# Patient Record
Sex: Male | Born: 1955
Health system: Southern US, Community
[De-identification: ages and names within clinical notes are randomized; demographics above are authoritative.]

## PROBLEM LIST (undated history)

## (undated) DIAGNOSIS — I10 Essential (primary) hypertension: Secondary | ICD-10-CM

## (undated) DIAGNOSIS — J449 Chronic obstructive pulmonary disease, unspecified: Secondary | ICD-10-CM

## (undated) DIAGNOSIS — N400 Enlarged prostate without lower urinary tract symptoms: Secondary | ICD-10-CM

## (undated) DIAGNOSIS — K219 Gastro-esophageal reflux disease without esophagitis: Secondary | ICD-10-CM

## (undated) DIAGNOSIS — F32A Depression, unspecified: Secondary | ICD-10-CM

## (undated) DIAGNOSIS — F200 Paranoid schizophrenia: Secondary | ICD-10-CM

## (undated) DIAGNOSIS — F419 Anxiety disorder, unspecified: Secondary | ICD-10-CM

## (undated) DIAGNOSIS — J189 Pneumonia, unspecified organism: Secondary | ICD-10-CM

## (undated) DIAGNOSIS — I2510 Atherosclerotic heart disease of native coronary artery without angina pectoris: Secondary | ICD-10-CM

## (undated) DIAGNOSIS — Z8719 Personal history of other diseases of the digestive system: Secondary | ICD-10-CM

## (undated) DIAGNOSIS — H409 Unspecified glaucoma: Secondary | ICD-10-CM

## (undated) DIAGNOSIS — Z8711 Personal history of peptic ulcer disease: Secondary | ICD-10-CM

## (undated) DIAGNOSIS — K5090 Crohn's disease, unspecified, without complications: Secondary | ICD-10-CM

## (undated) DIAGNOSIS — I82409 Acute embolism and thrombosis of unspecified deep veins of unspecified lower extremity: Secondary | ICD-10-CM

## (undated) DIAGNOSIS — J9811 Atelectasis: Secondary | ICD-10-CM

## (undated) DIAGNOSIS — K589 Irritable bowel syndrome without diarrhea: Secondary | ICD-10-CM

## (undated) DIAGNOSIS — F329 Major depressive disorder, single episode, unspecified: Secondary | ICD-10-CM

## (undated) DIAGNOSIS — E785 Hyperlipidemia, unspecified: Secondary | ICD-10-CM

## (undated) DIAGNOSIS — J42 Unspecified chronic bronchitis: Secondary | ICD-10-CM

## (undated) DIAGNOSIS — R7303 Prediabetes: Secondary | ICD-10-CM

## (undated) DIAGNOSIS — R011 Cardiac murmur, unspecified: Secondary | ICD-10-CM

## (undated) DIAGNOSIS — R55 Syncope and collapse: Secondary | ICD-10-CM

## (undated) DIAGNOSIS — I739 Peripheral vascular disease, unspecified: Secondary | ICD-10-CM

## (undated) DIAGNOSIS — I639 Cerebral infarction, unspecified: Secondary | ICD-10-CM

## (undated) DIAGNOSIS — R0600 Dyspnea, unspecified: Secondary | ICD-10-CM

## (undated) HISTORY — DX: Syncope and collapse: R55

## (undated) HISTORY — DX: Paranoid schizophrenia: F20.0

## (undated) HISTORY — DX: Peripheral vascular disease, unspecified: I73.9

## (undated) HISTORY — PX: FASCIOTOMY: SHX132

## (undated) HISTORY — DX: Dyspnea, unspecified: R06.00

## (undated) HISTORY — PX: TONSILLECTOMY: SUR1361

## (undated) HISTORY — DX: Irritable bowel syndrome, unspecified: K58.9

## (undated) HISTORY — DX: Hyperlipidemia, unspecified: E78.5

## (undated) HISTORY — DX: Atelectasis: J98.11

## (undated) HISTORY — DX: Crohn's disease, unspecified, without complications: K50.90

## (undated) HISTORY — PX: COLECTOMY: SHX59

## (undated) HISTORY — PX: HERNIA REPAIR: SHX51

## (undated) HISTORY — PX: LAPAROSCOPIC CHOLECYSTECTOMY: SUR755

## (undated) HISTORY — PX: ABDOMINAL HERNIA REPAIR: SHX539

## (undated) HISTORY — PX: EYE SURGERY: SHX253

## (undated) HISTORY — DX: Essential (primary) hypertension: I10

---

## 1997-06-19 HISTORY — PX: PERIPHERAL VASCULAR CATHETERIZATION: SHX172C

## 2000-12-11 ENCOUNTER — Emergency Department (HOSPITAL_COMMUNITY): Admission: EM | Admit: 2000-12-11 | Discharge: 2000-12-11 | Payer: Self-pay | Admitting: Emergency Medicine

## 2000-12-11 ENCOUNTER — Encounter: Payer: Self-pay | Admitting: Emergency Medicine

## 2001-01-14 ENCOUNTER — Emergency Department (HOSPITAL_COMMUNITY): Admission: EM | Admit: 2001-01-14 | Discharge: 2001-01-14 | Payer: Self-pay | Admitting: Emergency Medicine

## 2003-02-11 ENCOUNTER — Inpatient Hospital Stay (HOSPITAL_COMMUNITY): Admission: EM | Admit: 2003-02-11 | Discharge: 2003-02-12 | Payer: Self-pay | Admitting: Emergency Medicine

## 2003-04-07 ENCOUNTER — Encounter (HOSPITAL_COMMUNITY): Admission: RE | Admit: 2003-04-07 | Discharge: 2003-07-06 | Payer: Self-pay | Admitting: Internal Medicine

## 2004-02-13 ENCOUNTER — Emergency Department (HOSPITAL_COMMUNITY): Admission: EM | Admit: 2004-02-13 | Discharge: 2004-02-13 | Payer: Self-pay | Admitting: Emergency Medicine

## 2004-02-13 IMAGING — CR DG ABDOMEN ACUTE W/ 1V CHEST
4 series · 4 of 4 positions shown · non-contrast
Comparison: none

CLINICAL DATA: Nausea, vomiting, and abdominal cramping.
 ABDOMEN 2 VIEWS
 Supine and upright views show an unremarkable bowel gas pattern without evidence of ileus or obstruction.  There is a surgical clip in the right lower quadrant.  No abnormal calcifications or significant bony findings.
 CHEST 1 VIEW:
 The heart size is normal.  The mediastinum is unremarkable.  The lungs are clear.  No soft tissue or bony abnormality is seen.  No free air under the diaphragm.
 IMPRESSION
 1.  Negative acute abdominal series.

[view not recorded (1 of 4)]
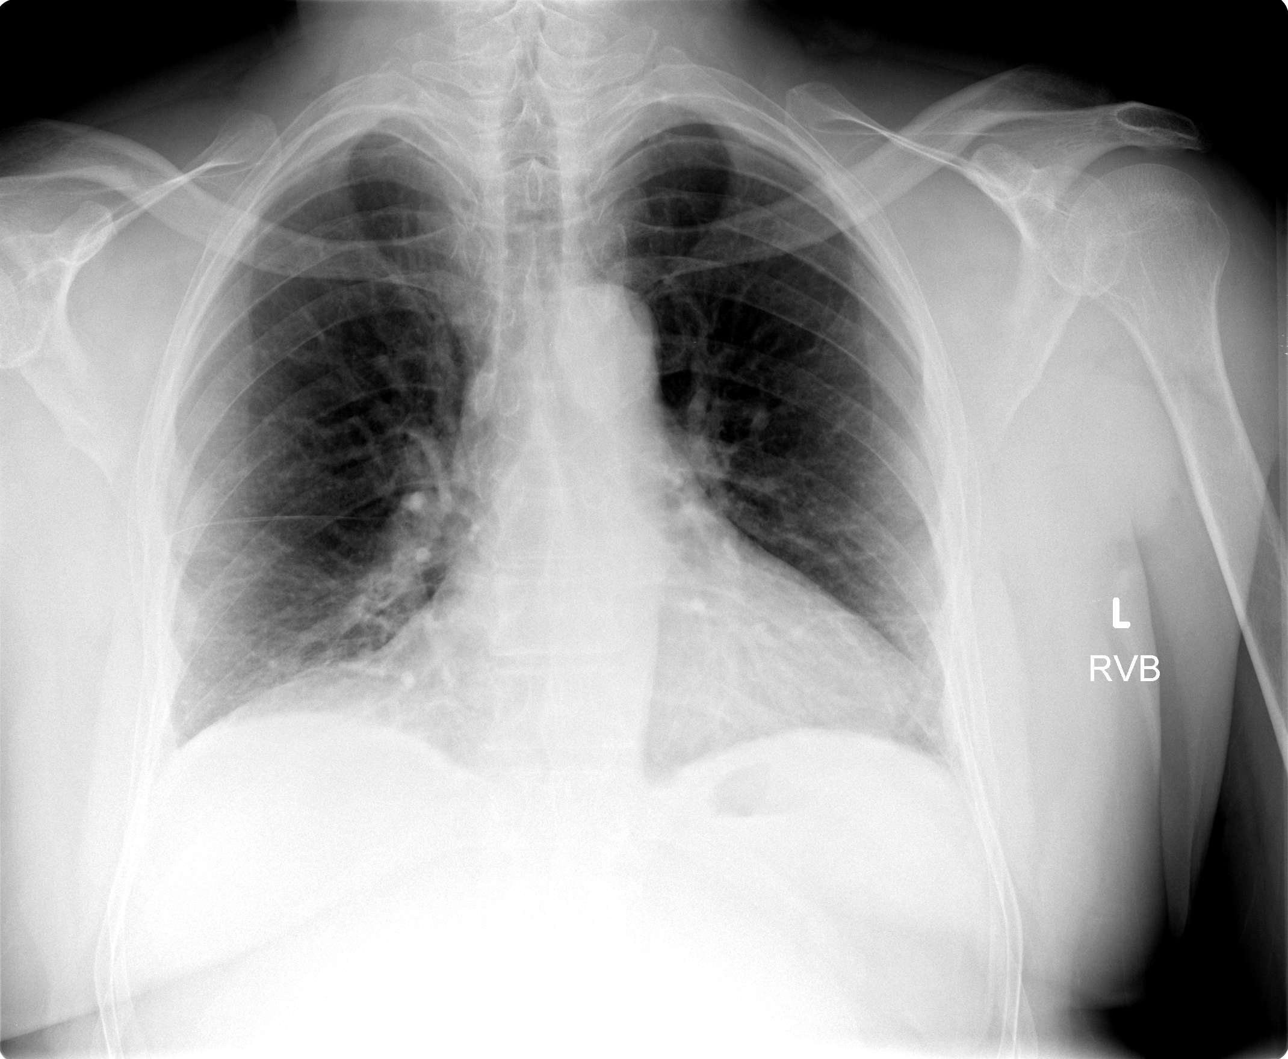

[view not recorded (2 of 4)]
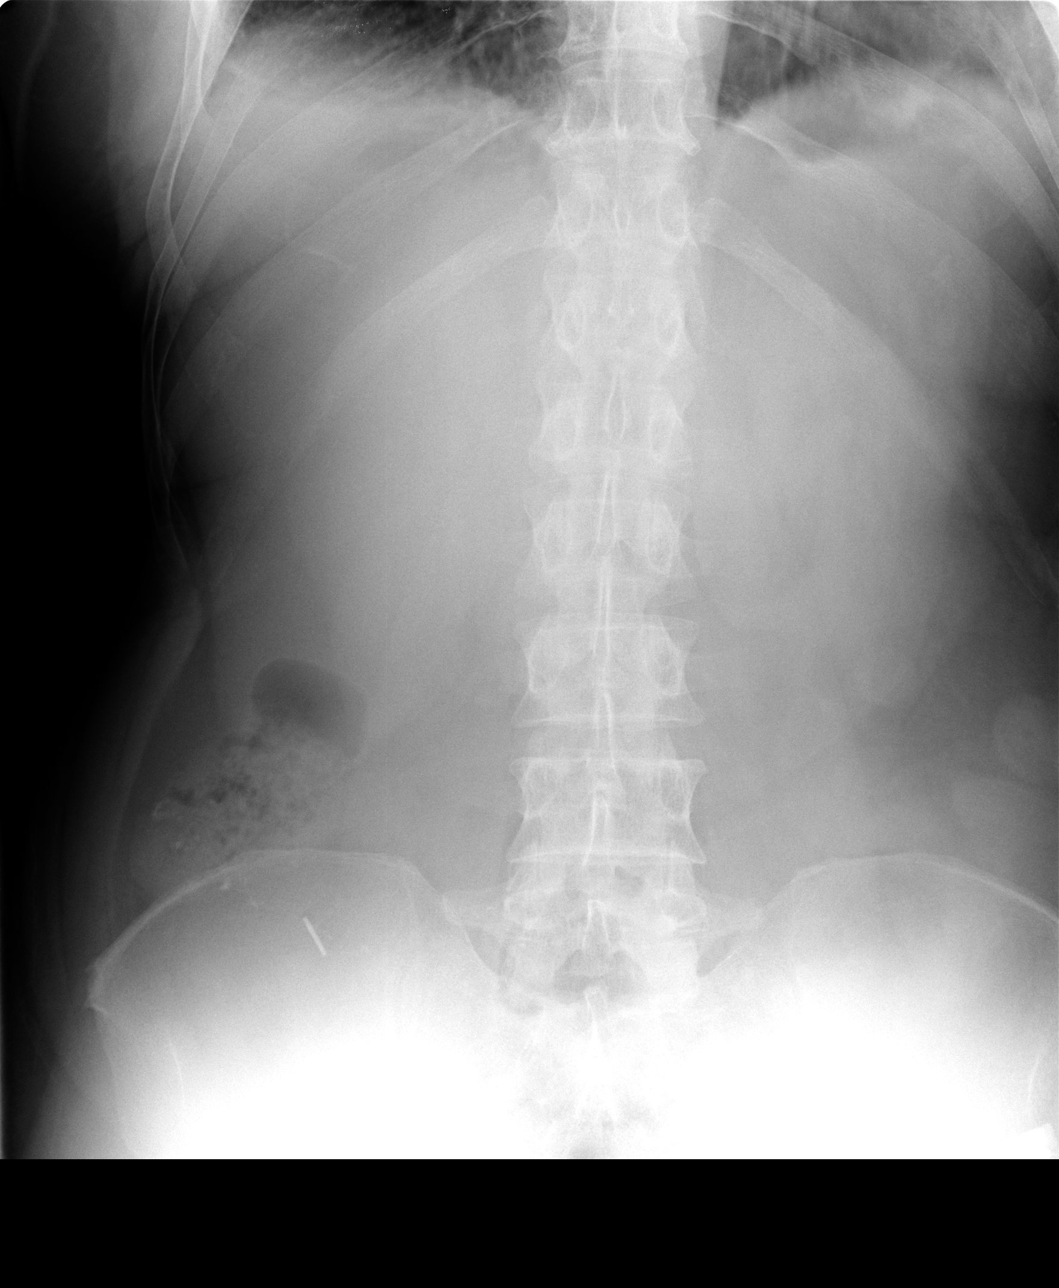

[view not recorded (3 of 4)]
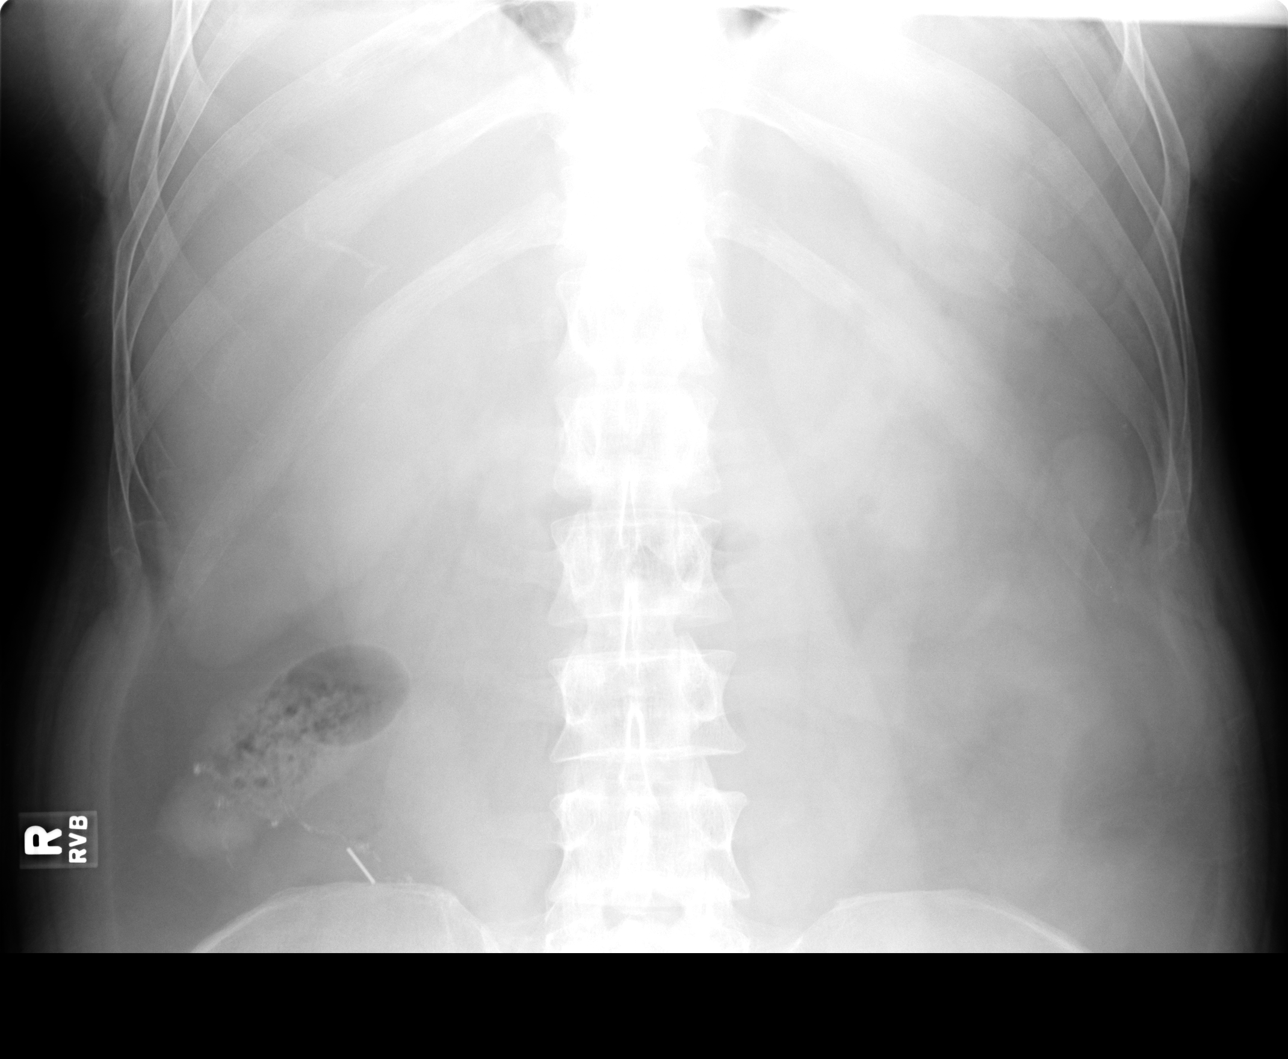

[view not recorded (4 of 4)]
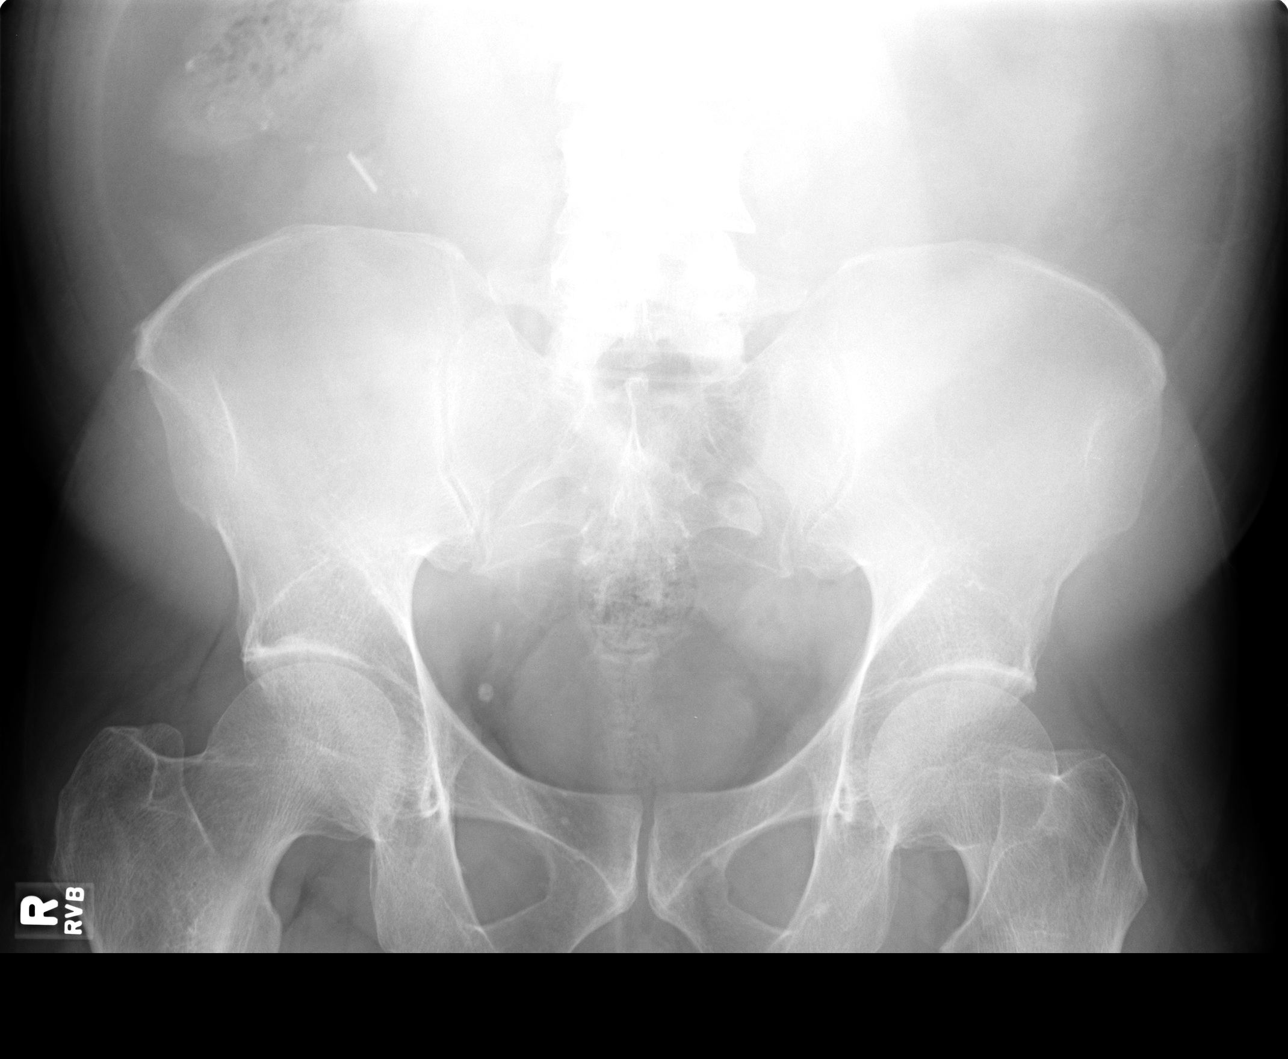

[4 of 4 positions shown; findings below may reference images not displayed]

## 2004-03-03 ENCOUNTER — Emergency Department (HOSPITAL_COMMUNITY): Admission: EM | Admit: 2004-03-03 | Discharge: 2004-03-03 | Payer: Self-pay | Admitting: Emergency Medicine

## 2004-03-03 IMAGING — CT CT HEAD W/O CM
1 of 2 series · 13 of 30 positions shown, 17 images · non-contrast
Comparison: None.

CLINICAL DATA: Dizziness, nausea/vomiting.
 CRANIAL CT - WITHOUT CONTRAST -   [DATE]
TECHNIQUE: 5 mm axial images were obtained from the skull base through the brain to the vertex.

[Series 3: — · axial · 0.43mm/px · z∈[-63,+62]mm · 13 of 31 slices shown, 17 images]
[im 3/31  brain]
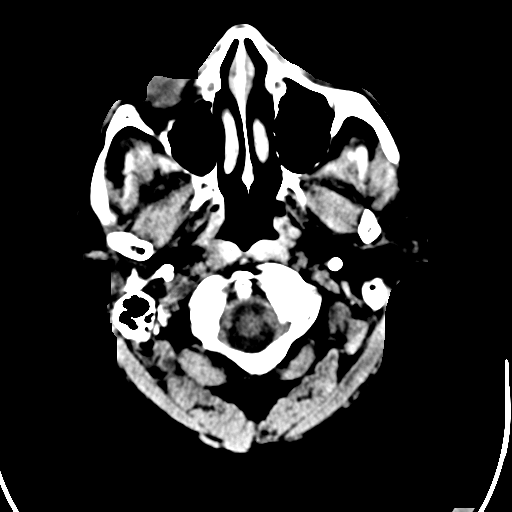
[im 3/31  bone]
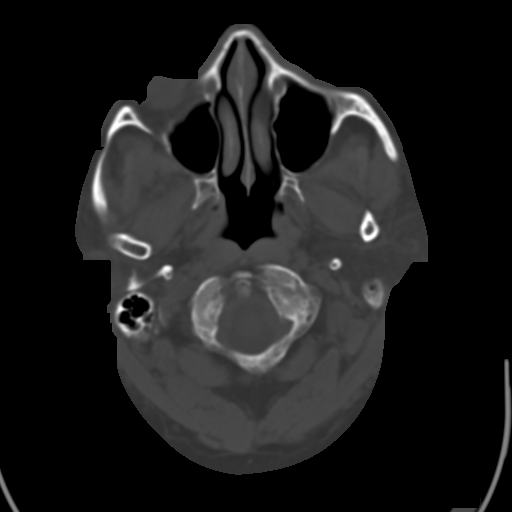
[im 5/31  brain]
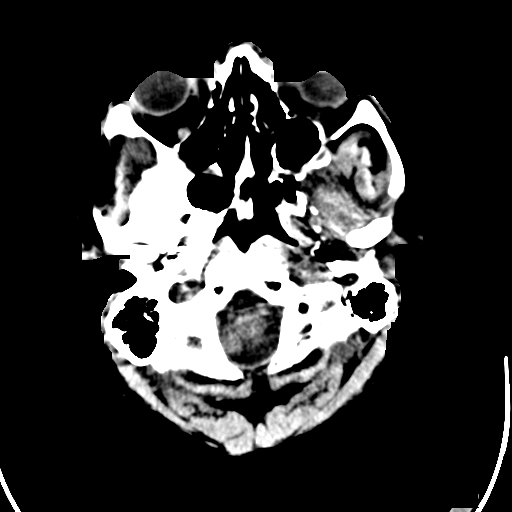
[im 7/31  brain]
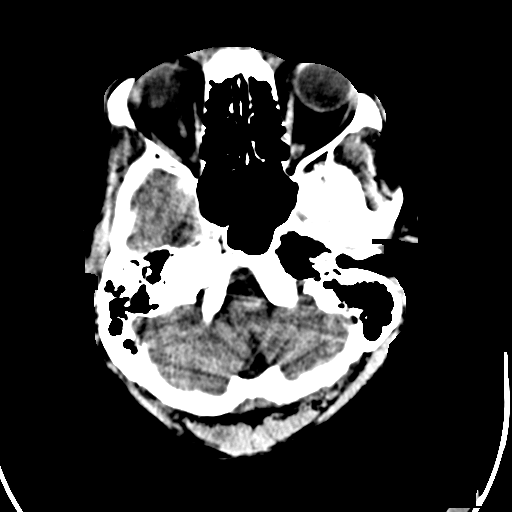
[im 9/31  brain]
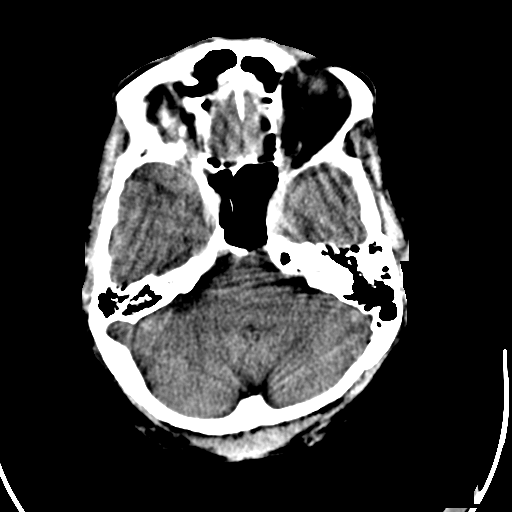
[im 11/31  brain]
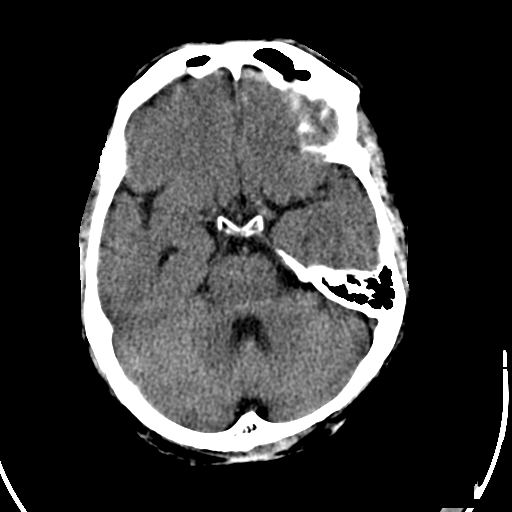
[im 11/31  bone]
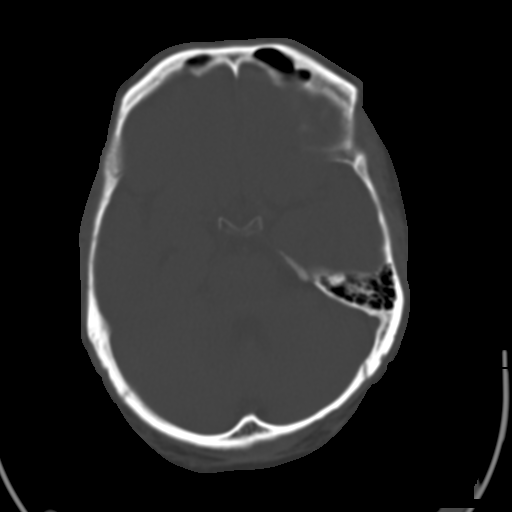
[im 13/31  brain]
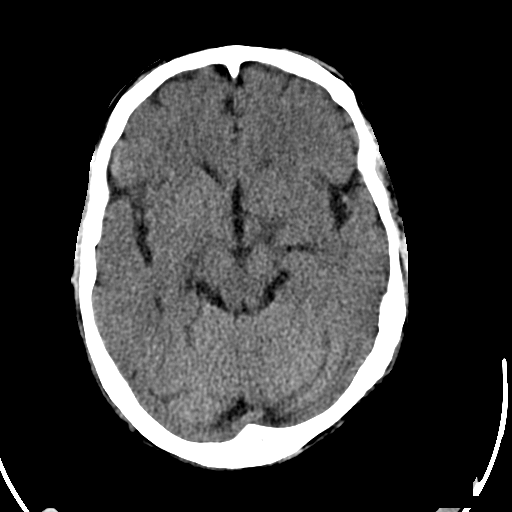
[im 16/31  brain]
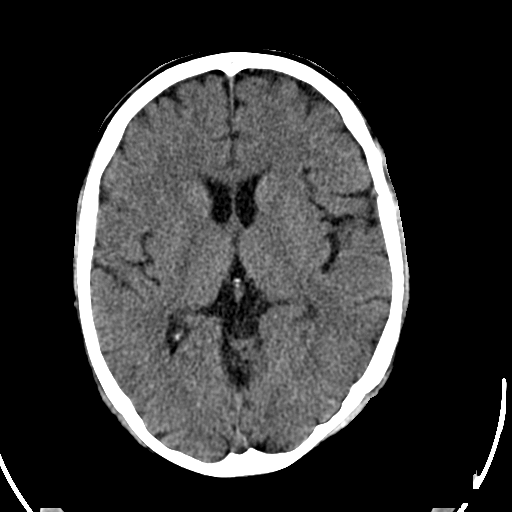
[im 18/31  brain]
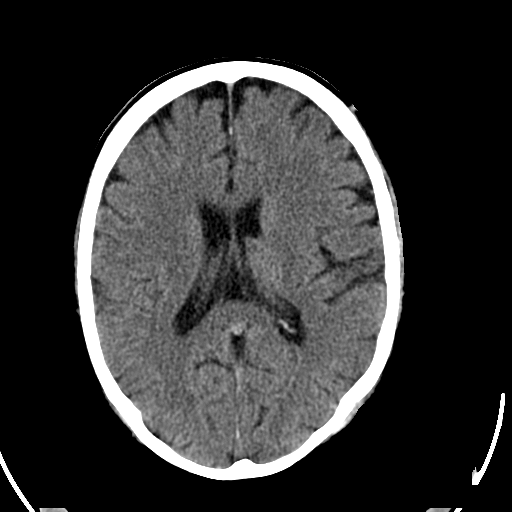
[im 20/31  brain]
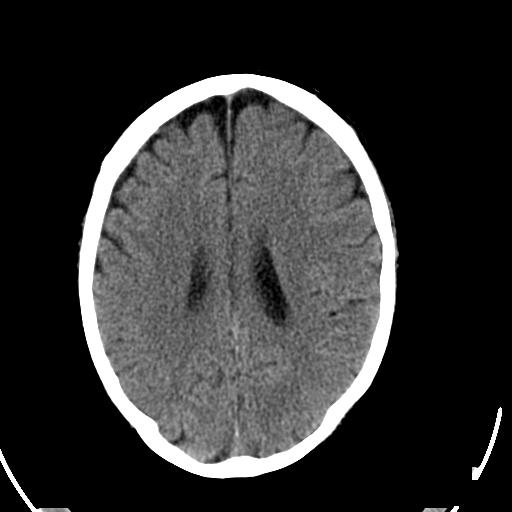
[im 20/31  bone]
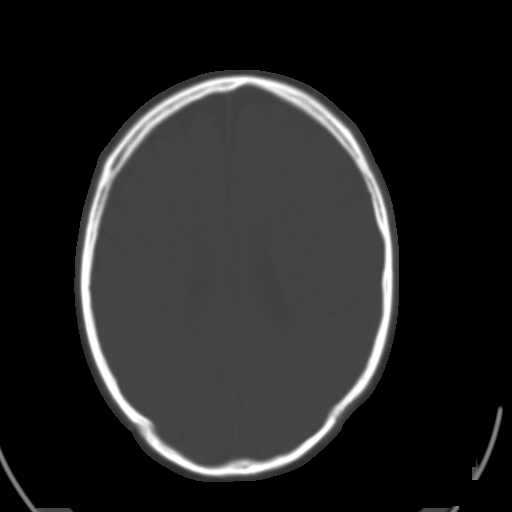
[im 22/31  brain]
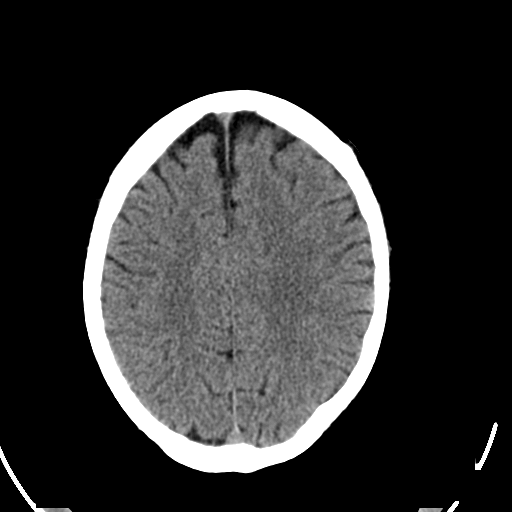
[im 24/31  brain]
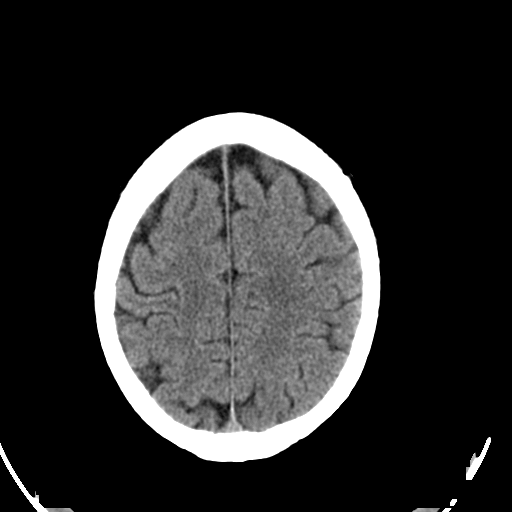
[im 26/31  brain]
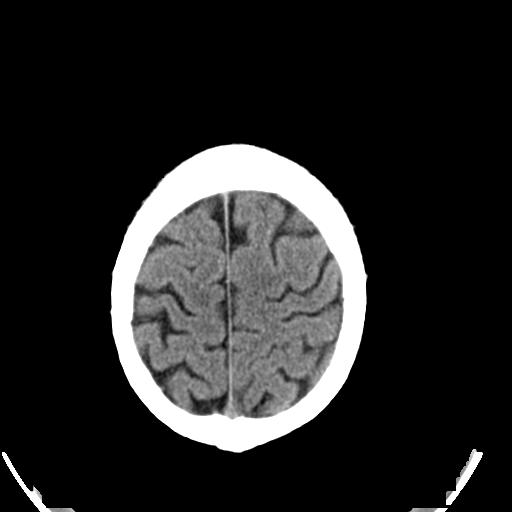
[im 28/31  brain]
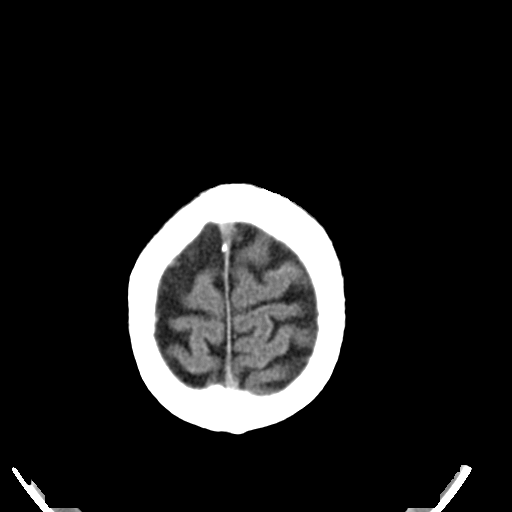
[im 28/31  bone]
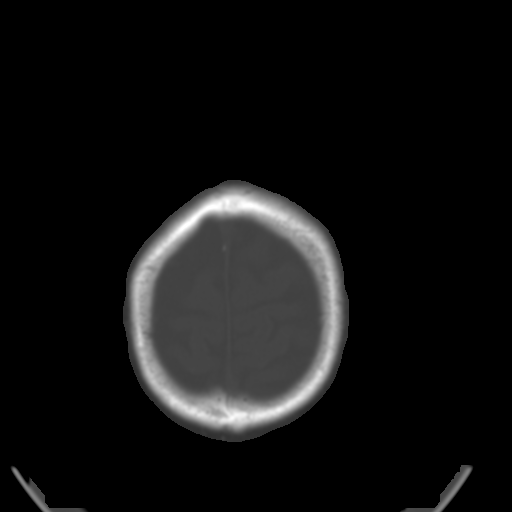

[13 of 30 positions shown; findings below may reference images not displayed]

FINDINGS: The ventricular system is normal in size and appearance for age.  There is no mass effect or midline shift.  There is no hemorrhage or hematoma.  No extra-axial fluid collections are identified.  I see no focal brain parenchymal abnormalities. 
 The bone window images demonstrate no focal osseous abnormalities involving the skull.  The visualized paranasal sinuses and the mastoid air cells appear well aerated.   
 IMPRESSION
 Normal unenhanced cranial CT.

## 2004-05-06 ENCOUNTER — Encounter: Admission: RE | Admit: 2004-05-06 | Discharge: 2004-05-06 | Payer: Self-pay | Admitting: Internal Medicine

## 2004-05-06 IMAGING — CR DG HIP COMPLETE 2+V*R*
3 series · 3 of 3 positions shown · non-contrast
Comparison: none

CLINICAL DATA: Right hip pain.  No known injury.
 DIAGNOSTIC HIP COMPLETE RIGHT:
 Mild superior degenerative joint space narrowing is greater at the right than left hip.  No other significant osseous, articular, nor soft tissue abnormality is seen.

[view not recorded (1 of 3)]
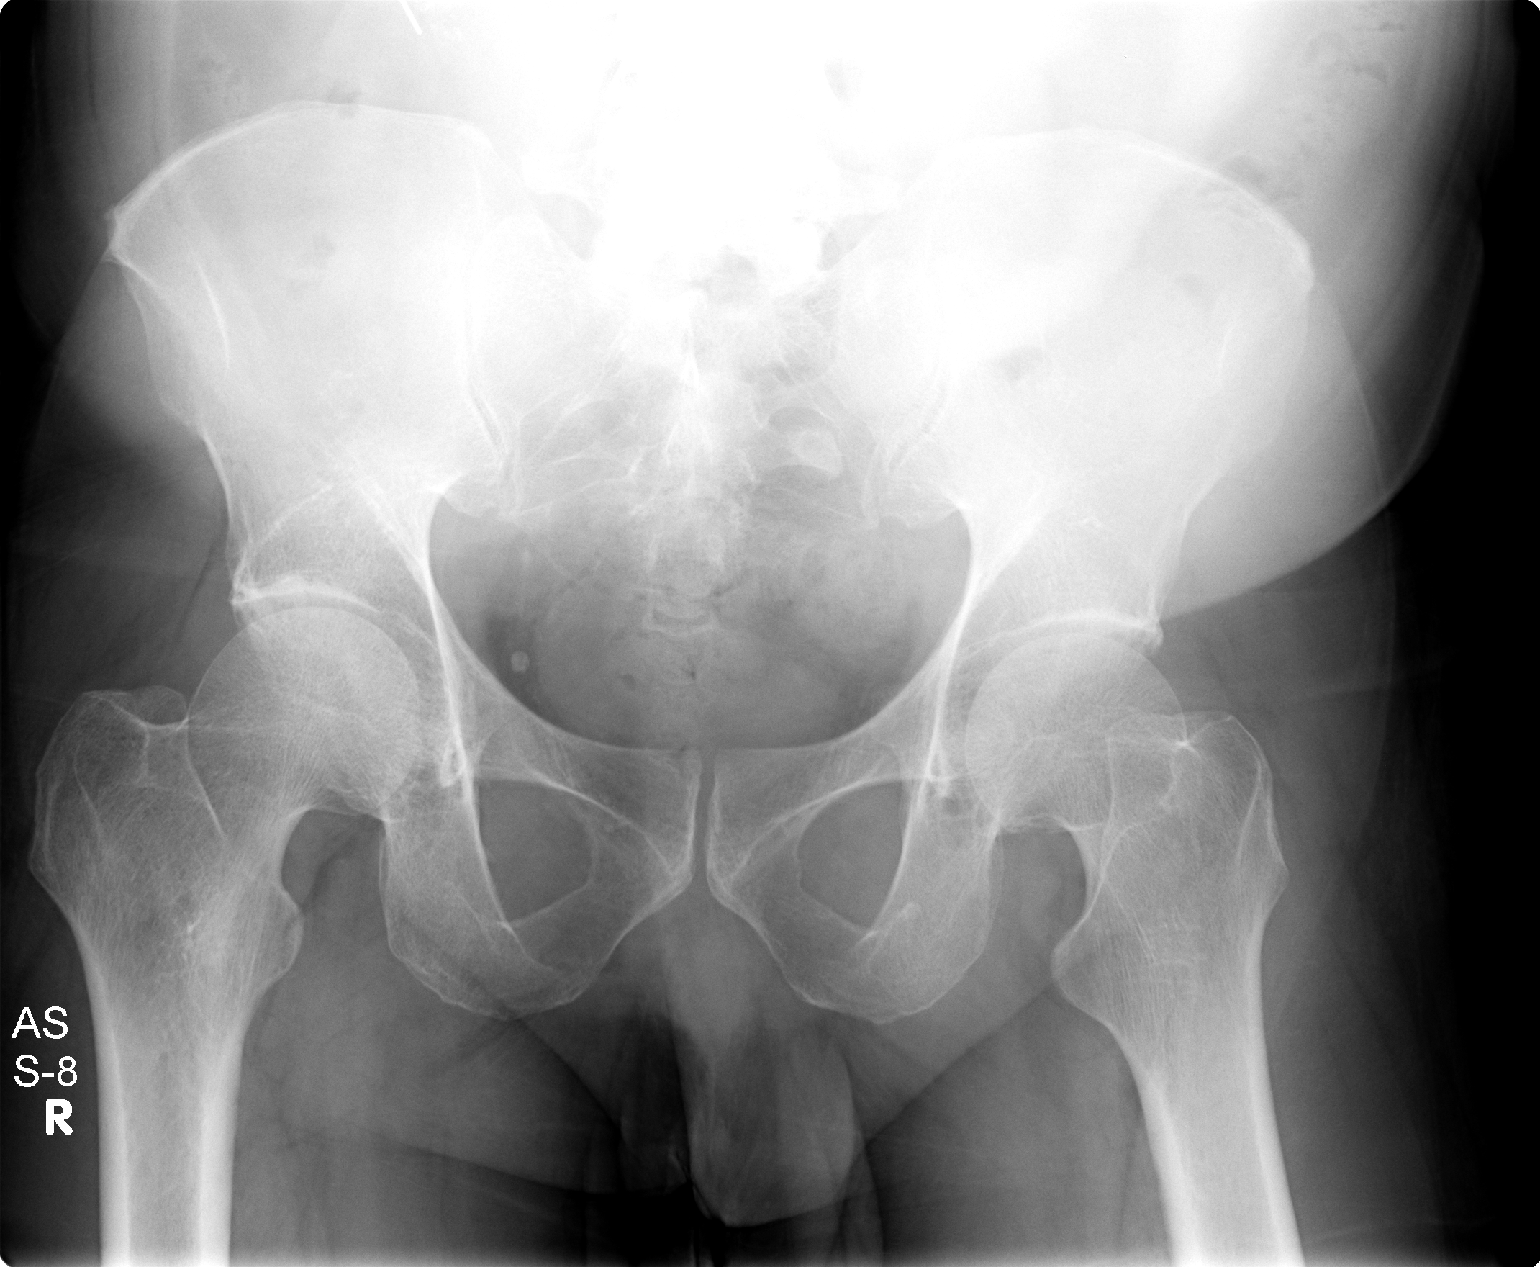

[view not recorded (2 of 3)]
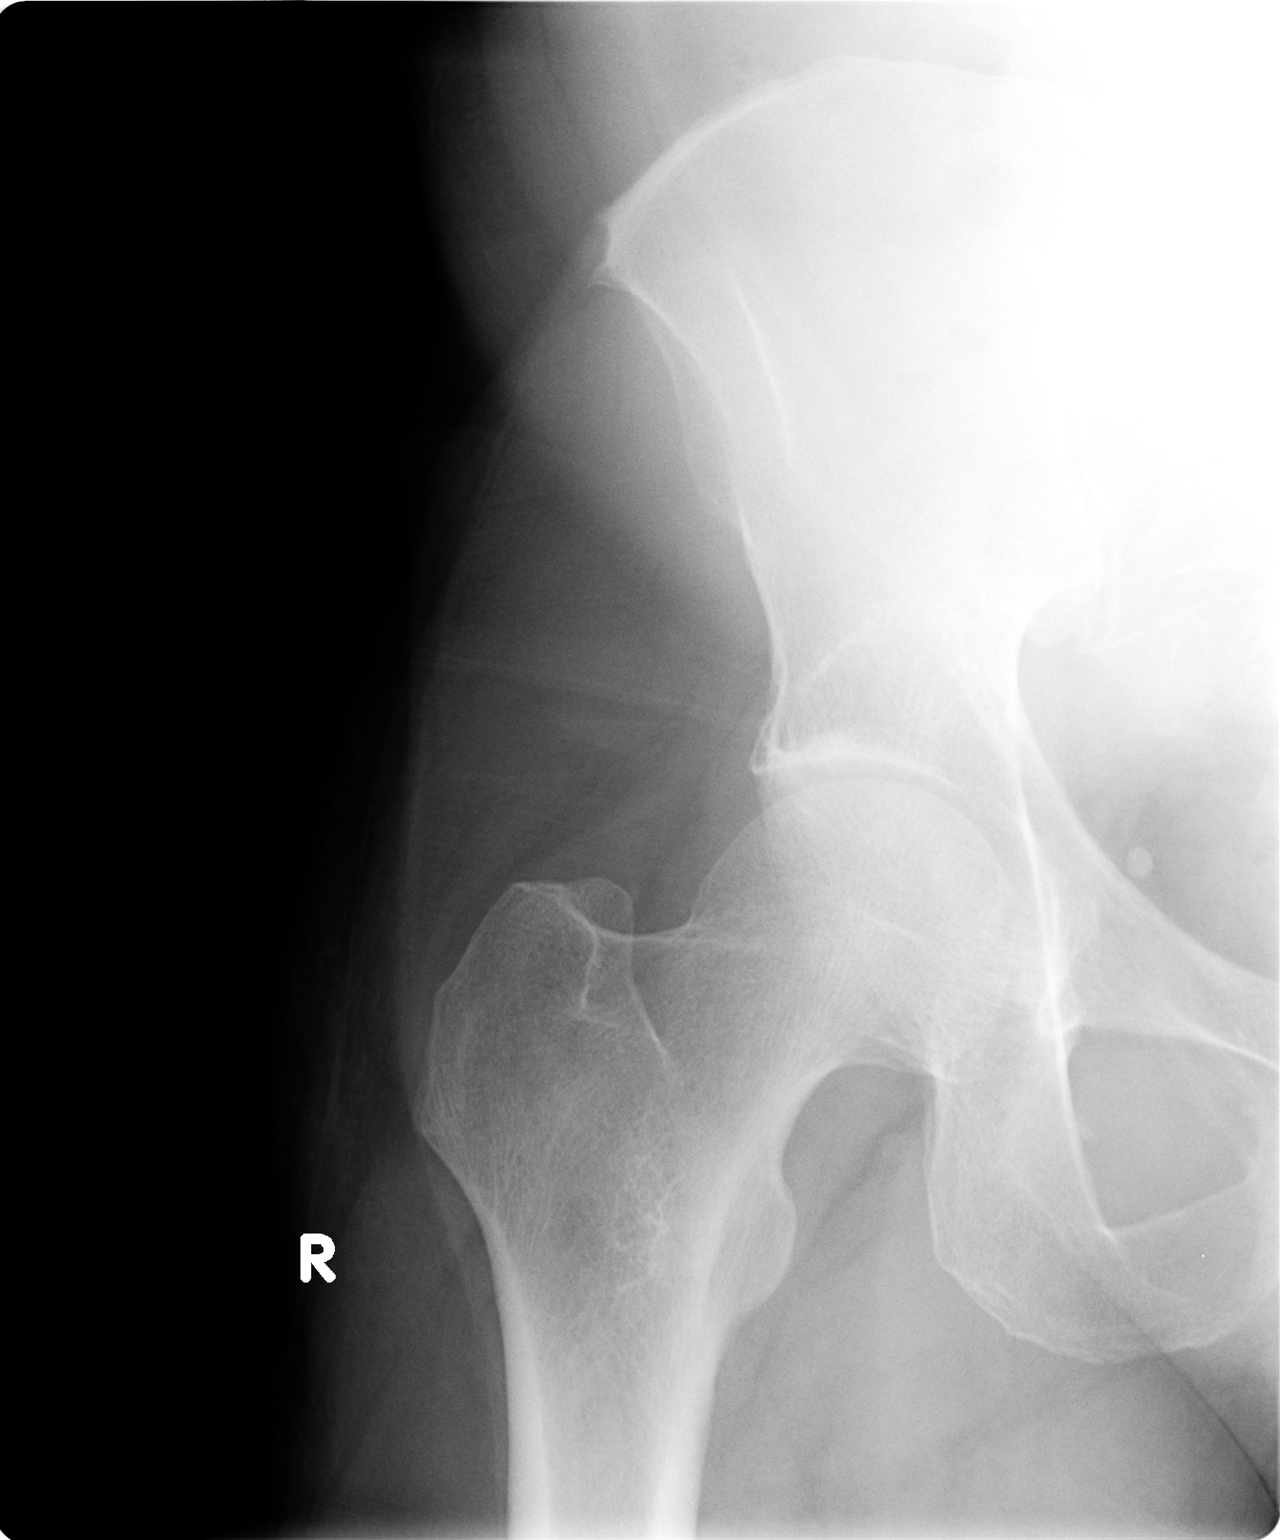

[view not recorded (3 of 3)]
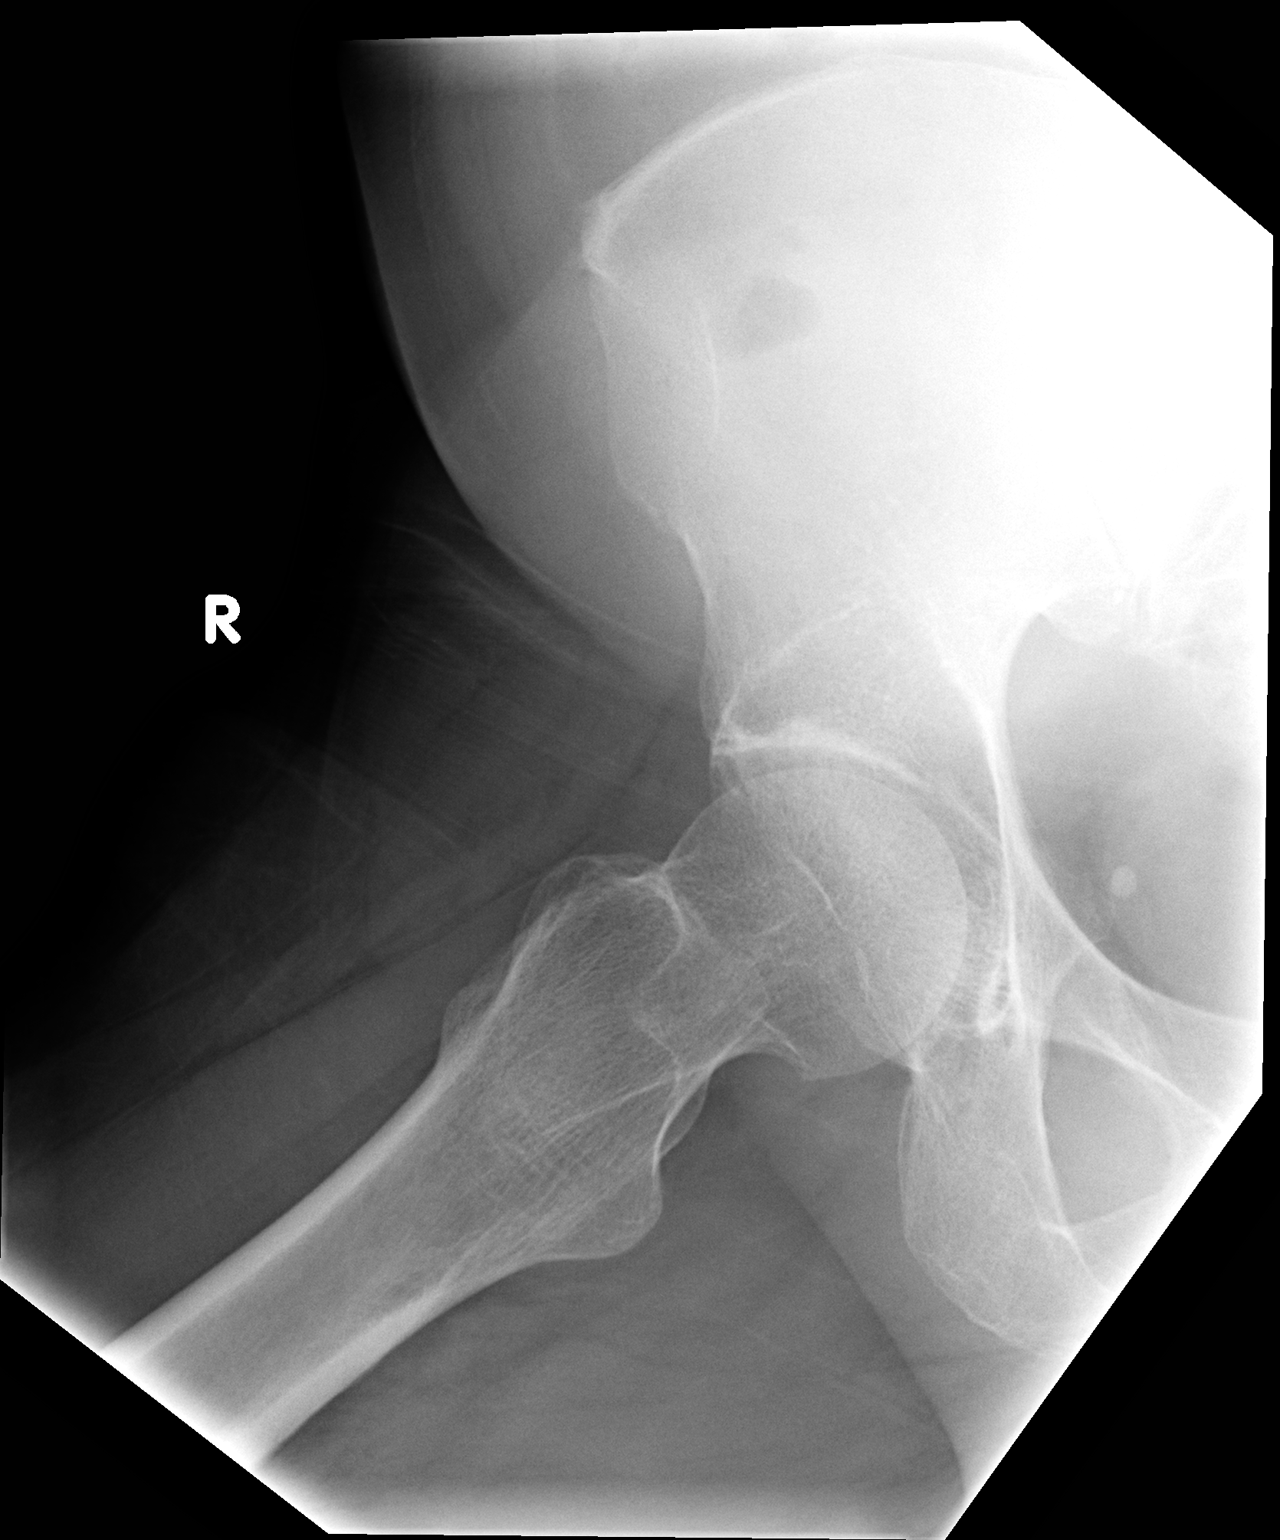

[3 of 3 positions shown; findings below may reference images not displayed]

IMPRESSION: 1.  Slight right greater than left superior degenerative joint space narrowing at the hip. 
 2.  Otherwise negative.

## 2004-05-21 IMAGING — CR DG CHEST 2V
2 series · 2 of 2 positions shown · non-contrast
Comparison: Frontal chest radiograph from abdominal series dated [DATE].

CLINICAL DATA: Nausea and dizziness.
 TWO VIEW CHEST   - [DATE]

[view not recorded (1 of 2)]
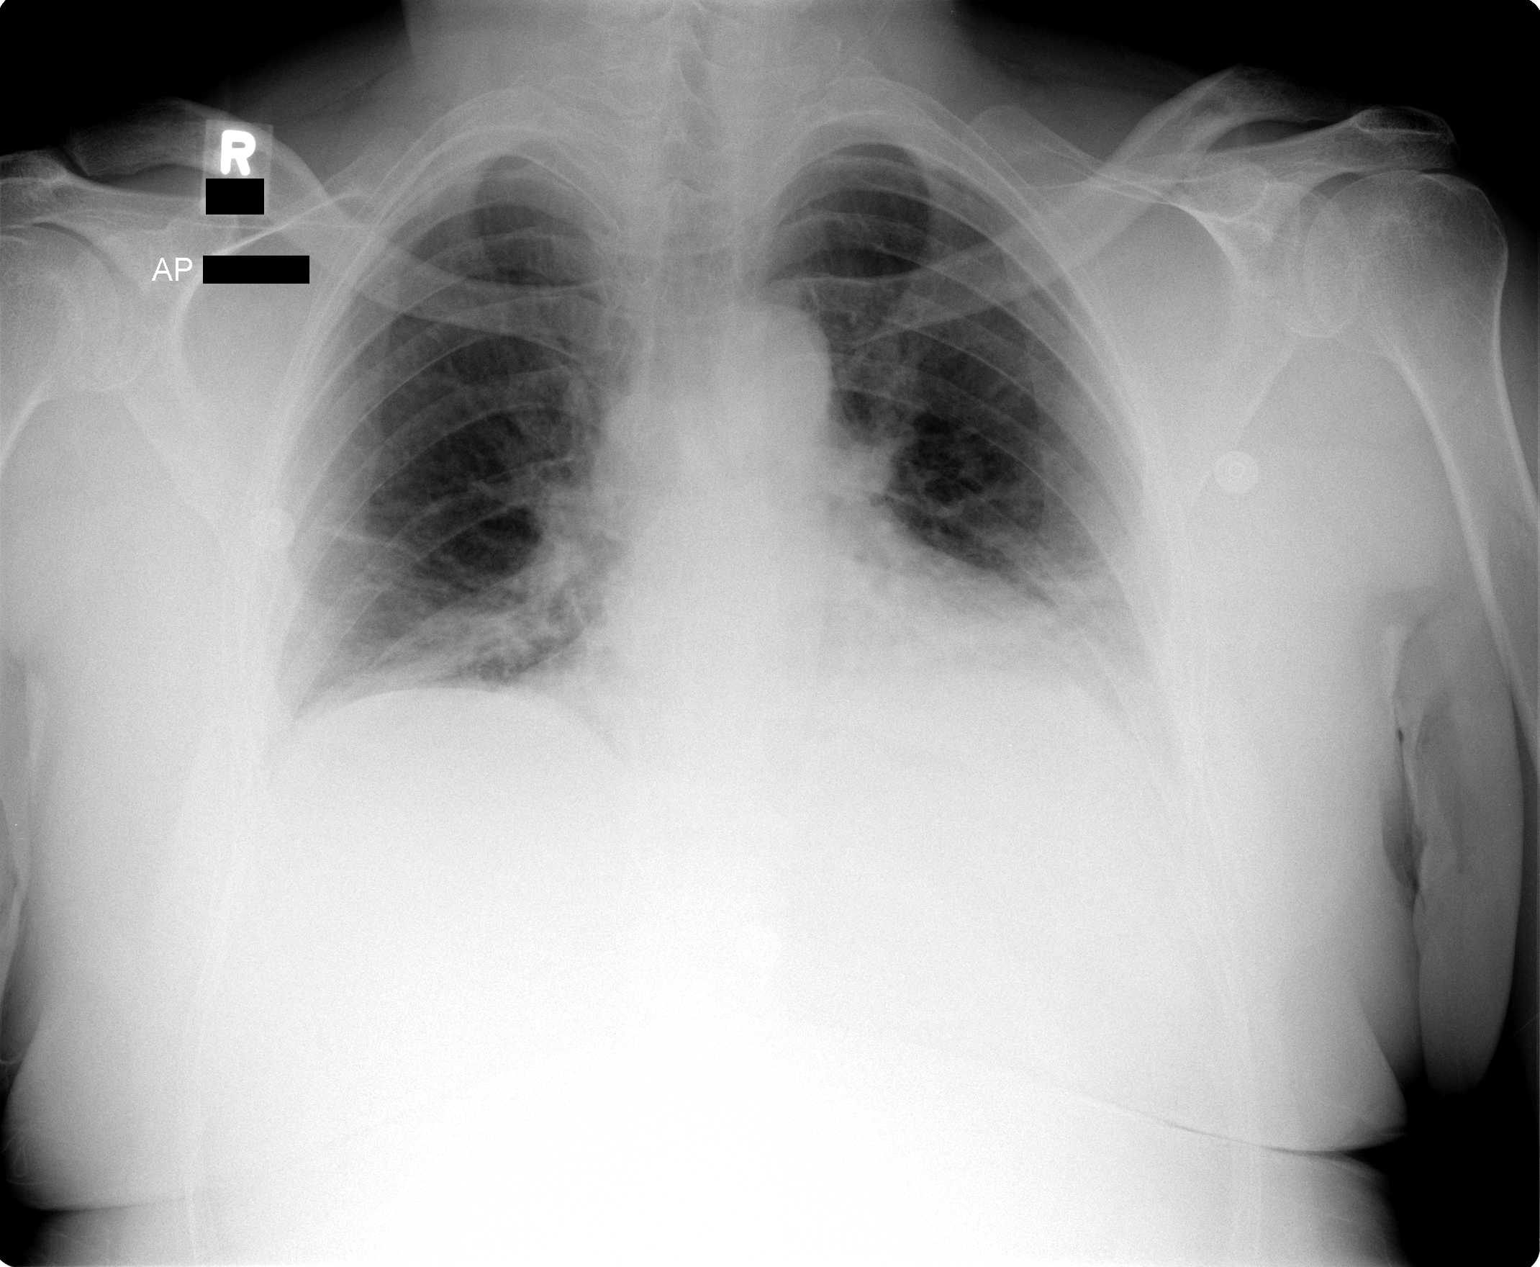

[view not recorded (2 of 2)]
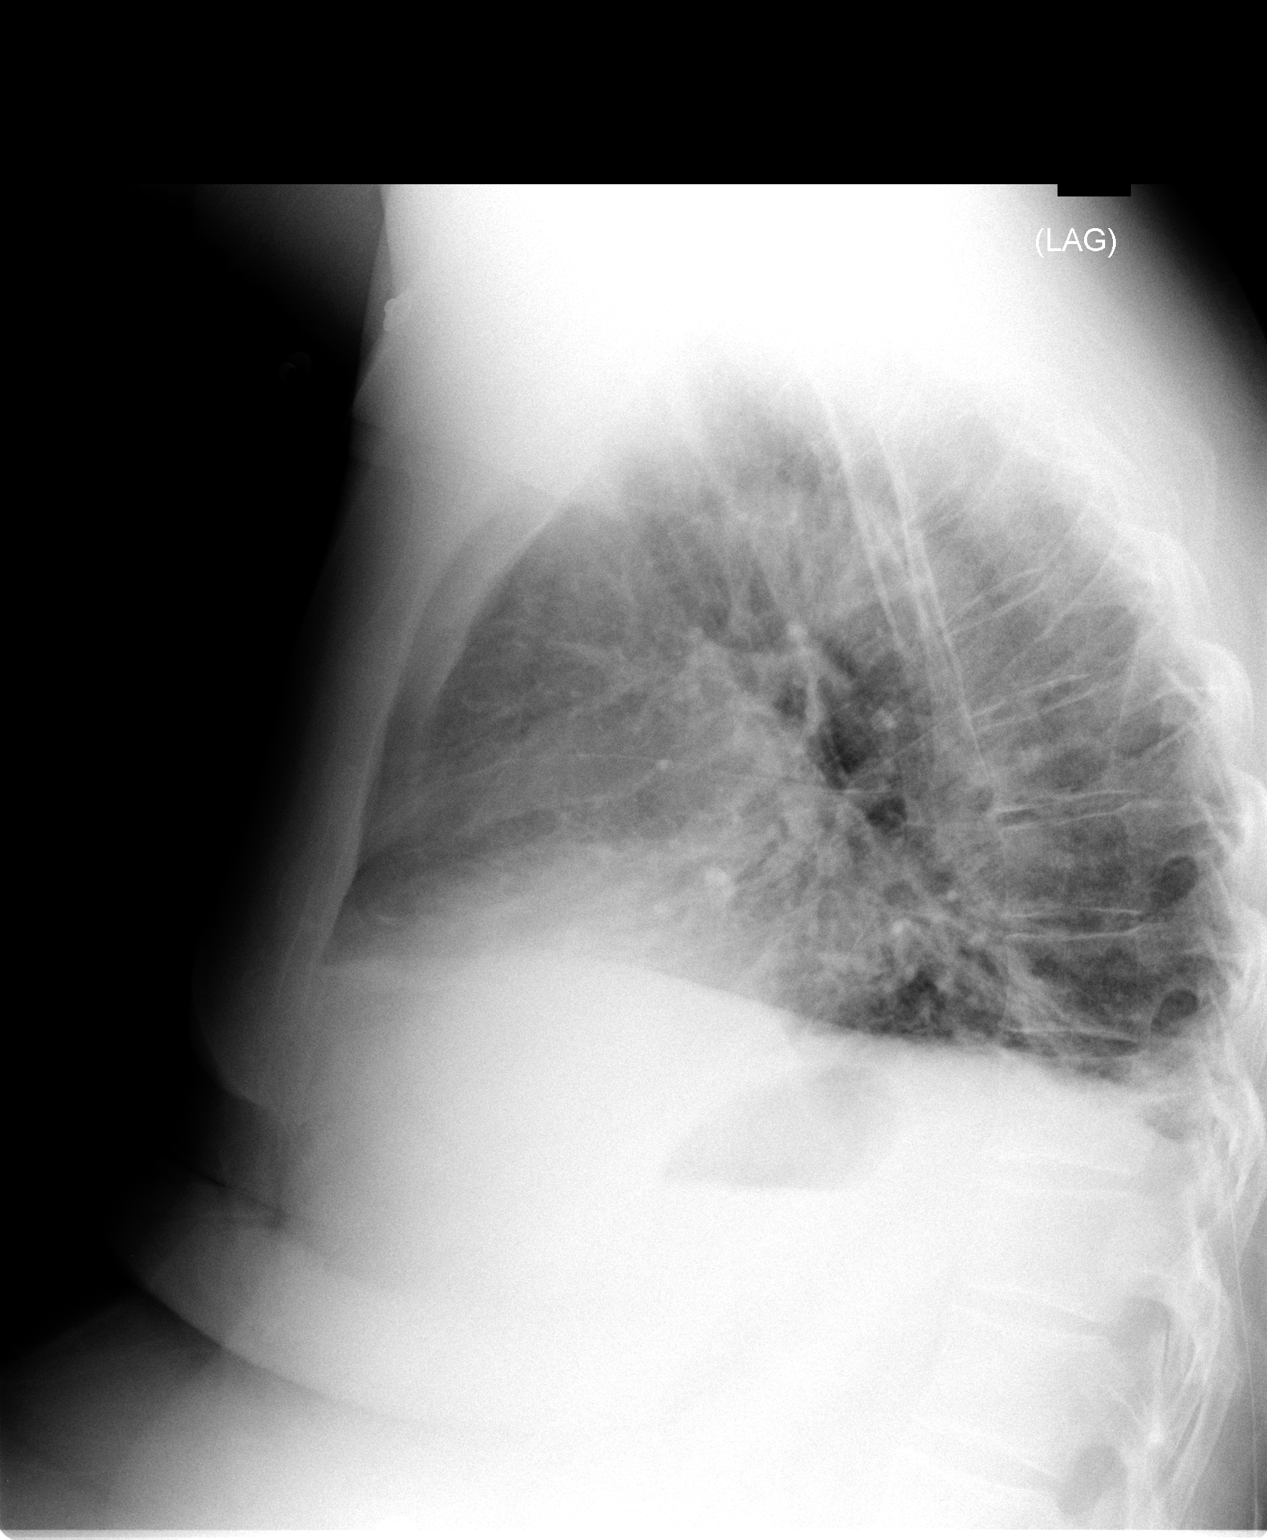

[2 of 2 positions shown; findings below may reference images not displayed]

There are new bibasilar infiltrates versus atelectasis.  A small amount of pleural fluid is also suspected bilaterally.  No overt edema.  Heart size is stable.
 IMPRESSION
 Bibasilar atelectasis/infiltrates with small pleural effusions.

## 2004-07-05 ENCOUNTER — Emergency Department (HOSPITAL_COMMUNITY): Admission: EM | Admit: 2004-07-05 | Discharge: 2004-07-06 | Payer: Self-pay | Admitting: Emergency Medicine

## 2004-07-06 IMAGING — US US ABDOMEN COMPLETE
1 series · 14 of 25 positions shown · non-contrast
Comparison: None

CLINICAL DATA: Abdominal pain, nausea, vomiting

ABDOMEN ULTRASOUND
TECHNIQUE: Complete abdominal ultrasound examination was performed including
evaluation of the liver, gallbladder, bile ducts, pancreas, kidneys, spleen,
IVC, and abdominal aorta.

[Series 1: unknown · 0.33mm/px · 14 of 46 slices shown]
[im 1/46]
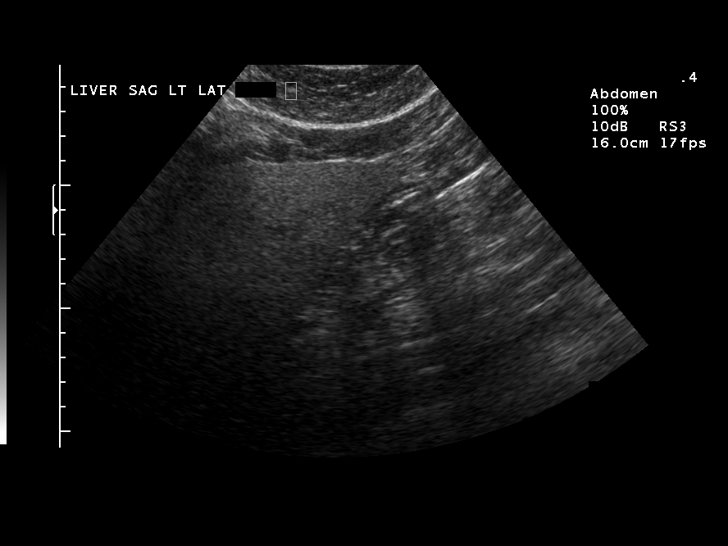
[im 4/46]
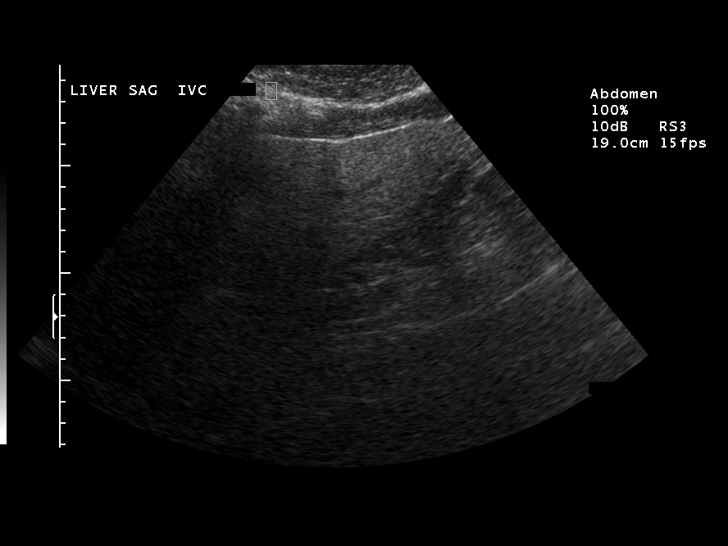
[im 8/46]
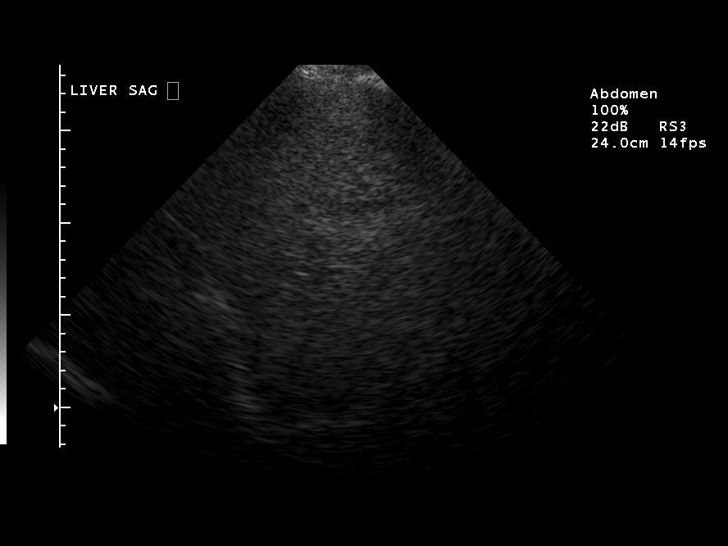
[im 12/46]
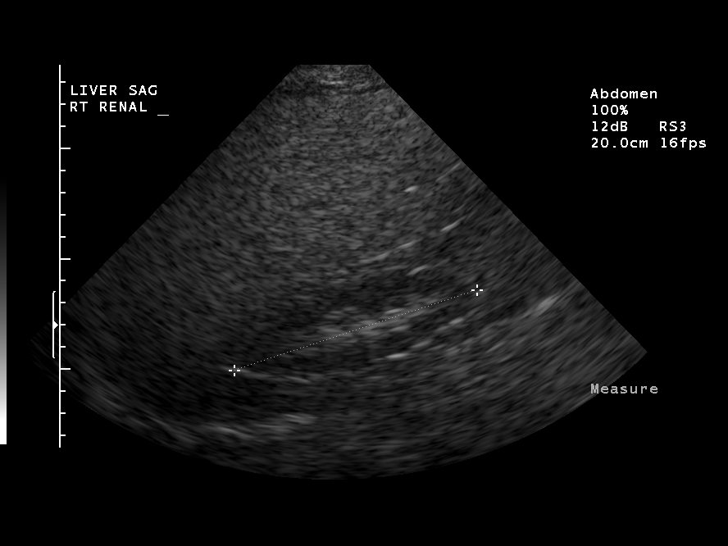
[im 16/46]
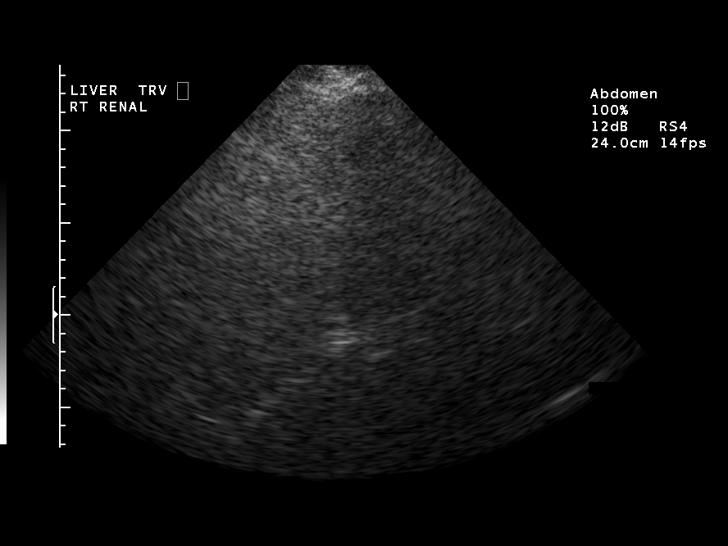
[im 17/46]
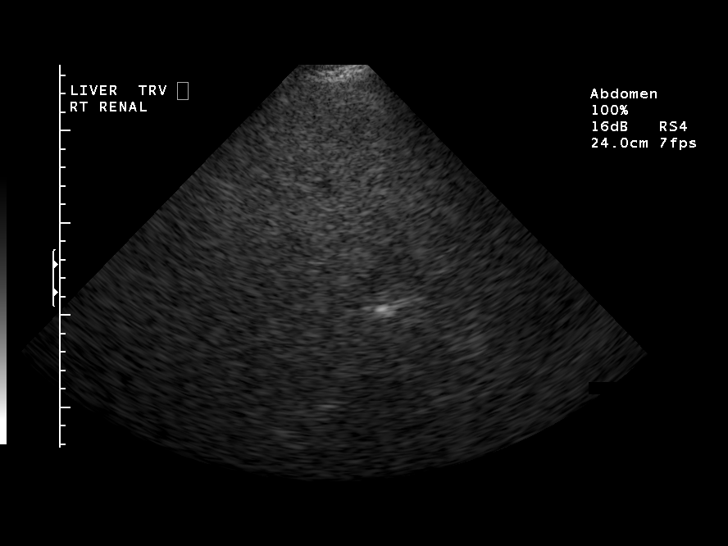
[im 21/46]
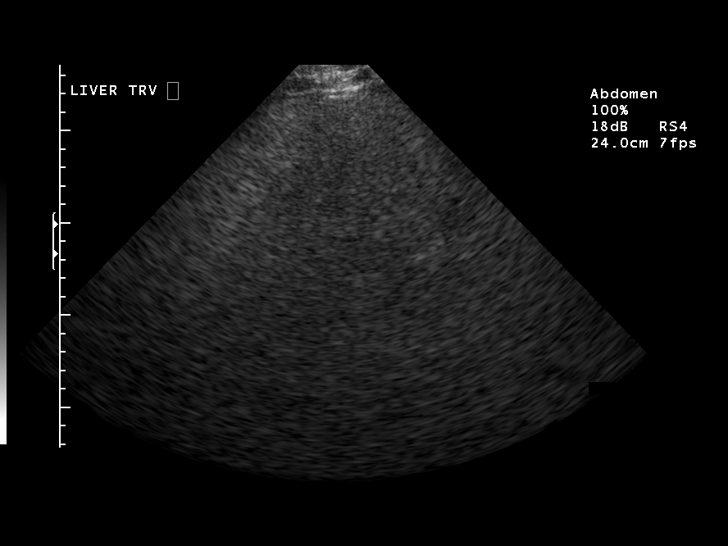
[im 25/46]
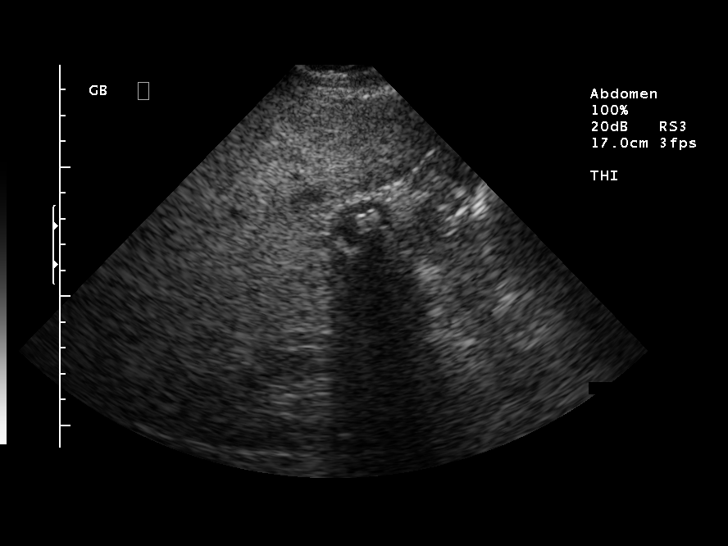
[im 29/46]
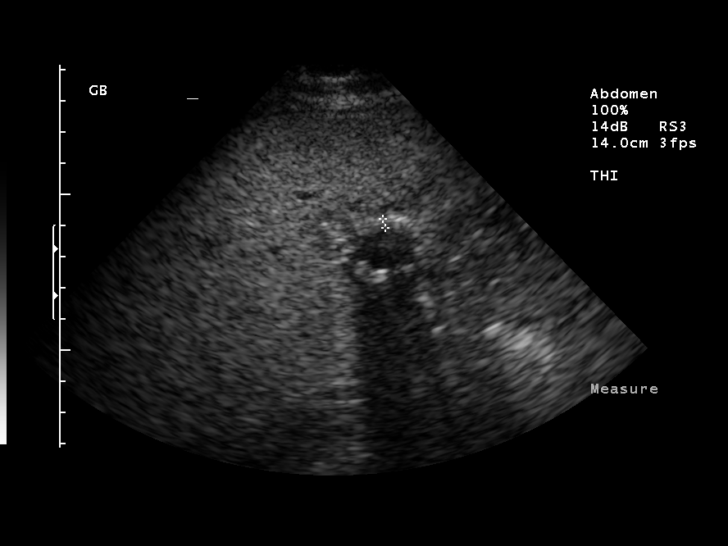
[im 31/46]
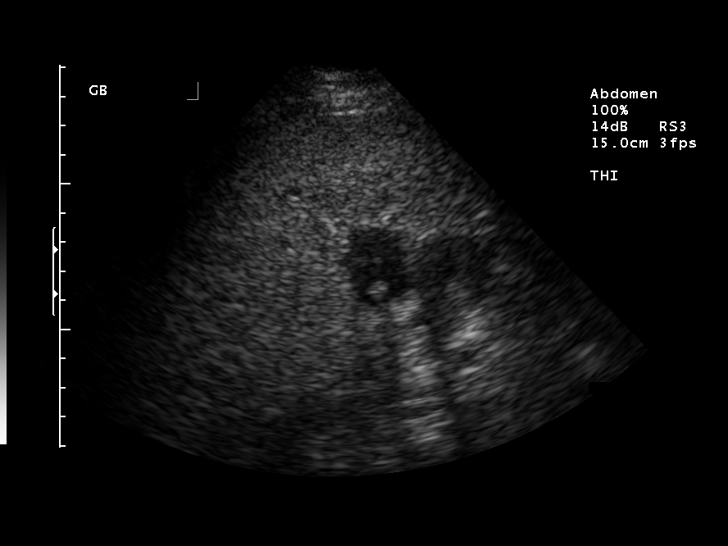
[im 34/46]
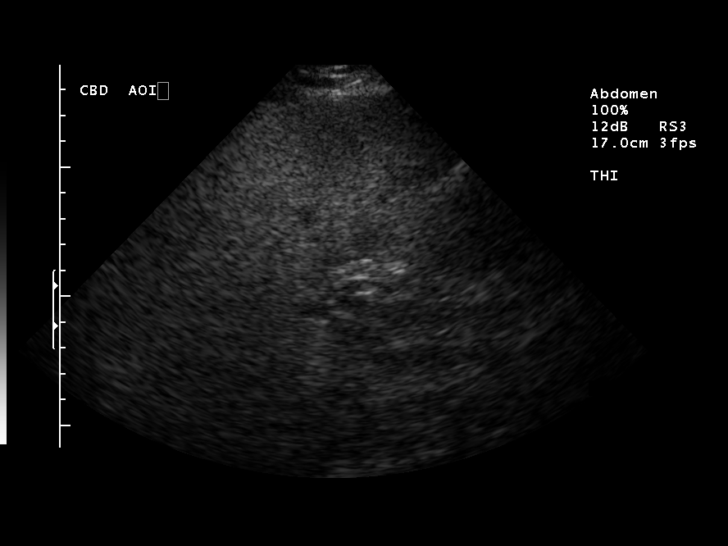
[im 38/46]
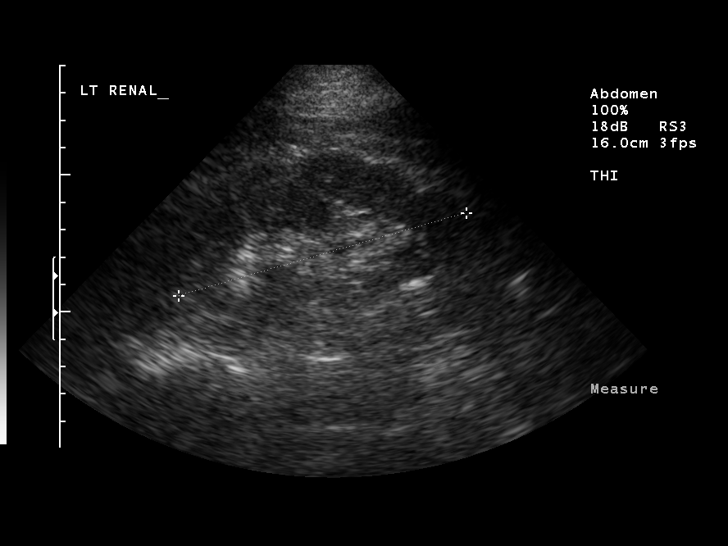
[im 42/46]
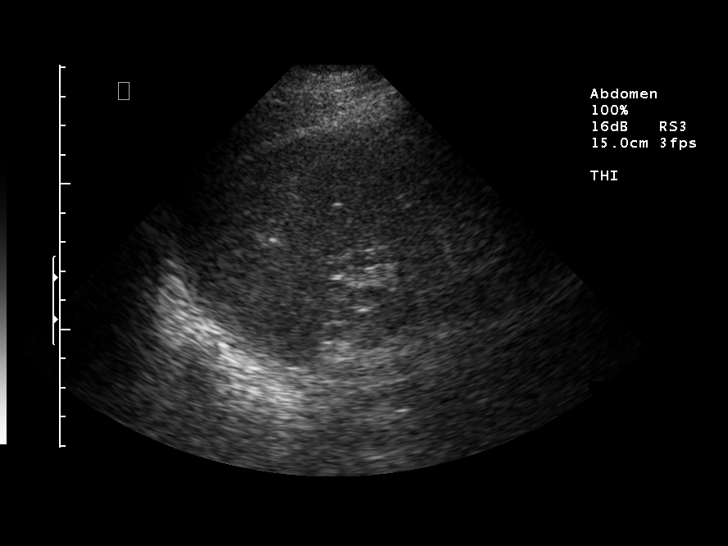
[im 46/46]
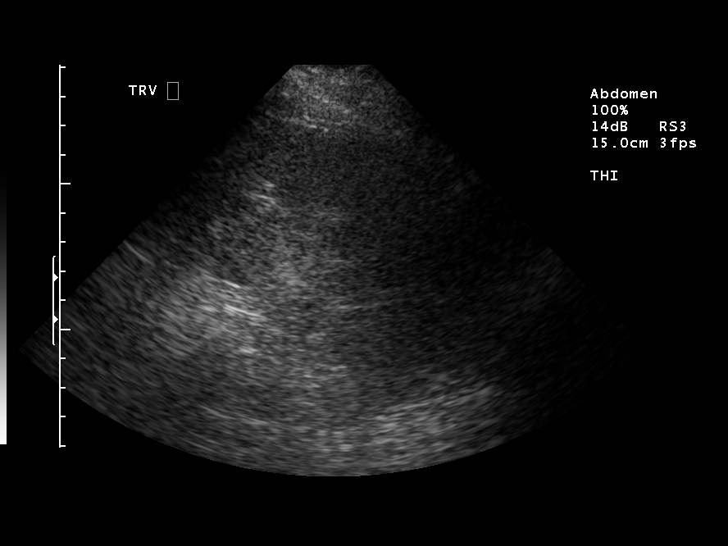

[14 of 25 positions shown; findings below may reference images not displayed]

FINDINGS: Visualization is limited due to body habitus and bowel gas. There is
fatty infiltration of the liver. Multiple gallstones are noted within the
gallbladder. Gallbladder wall is borderline at 3 mm. No sonographic Murphy's
sign or pericholecystic fluid. Common bile duct is normal at 4.5 mm.

The IVC and pancreas are not visualized. Limited spleen images are unremarkable.
The kidneys are symmetric and echotexture and size without hydronephrosis,
stone, or no mass. Mild ectasia of the aorta measuring maximally 2.9 cm

IMPRESSION

Cholelithiasis. The gallbladder wall is borderline. Otherwise no definite
evidence of acute cholecystitis.

Fatty liver.

Study somewhat limited by body habitus and bowel gas.

## 2004-07-06 IMAGING — CR DG CHEST 2V
1 series · 1 of 1 positions shown · non-contrast
Comparison: [DATE]

CLINICAL DATA: Fever, abdominal pain

CHEST - 2 VIEW:

[view not recorded]
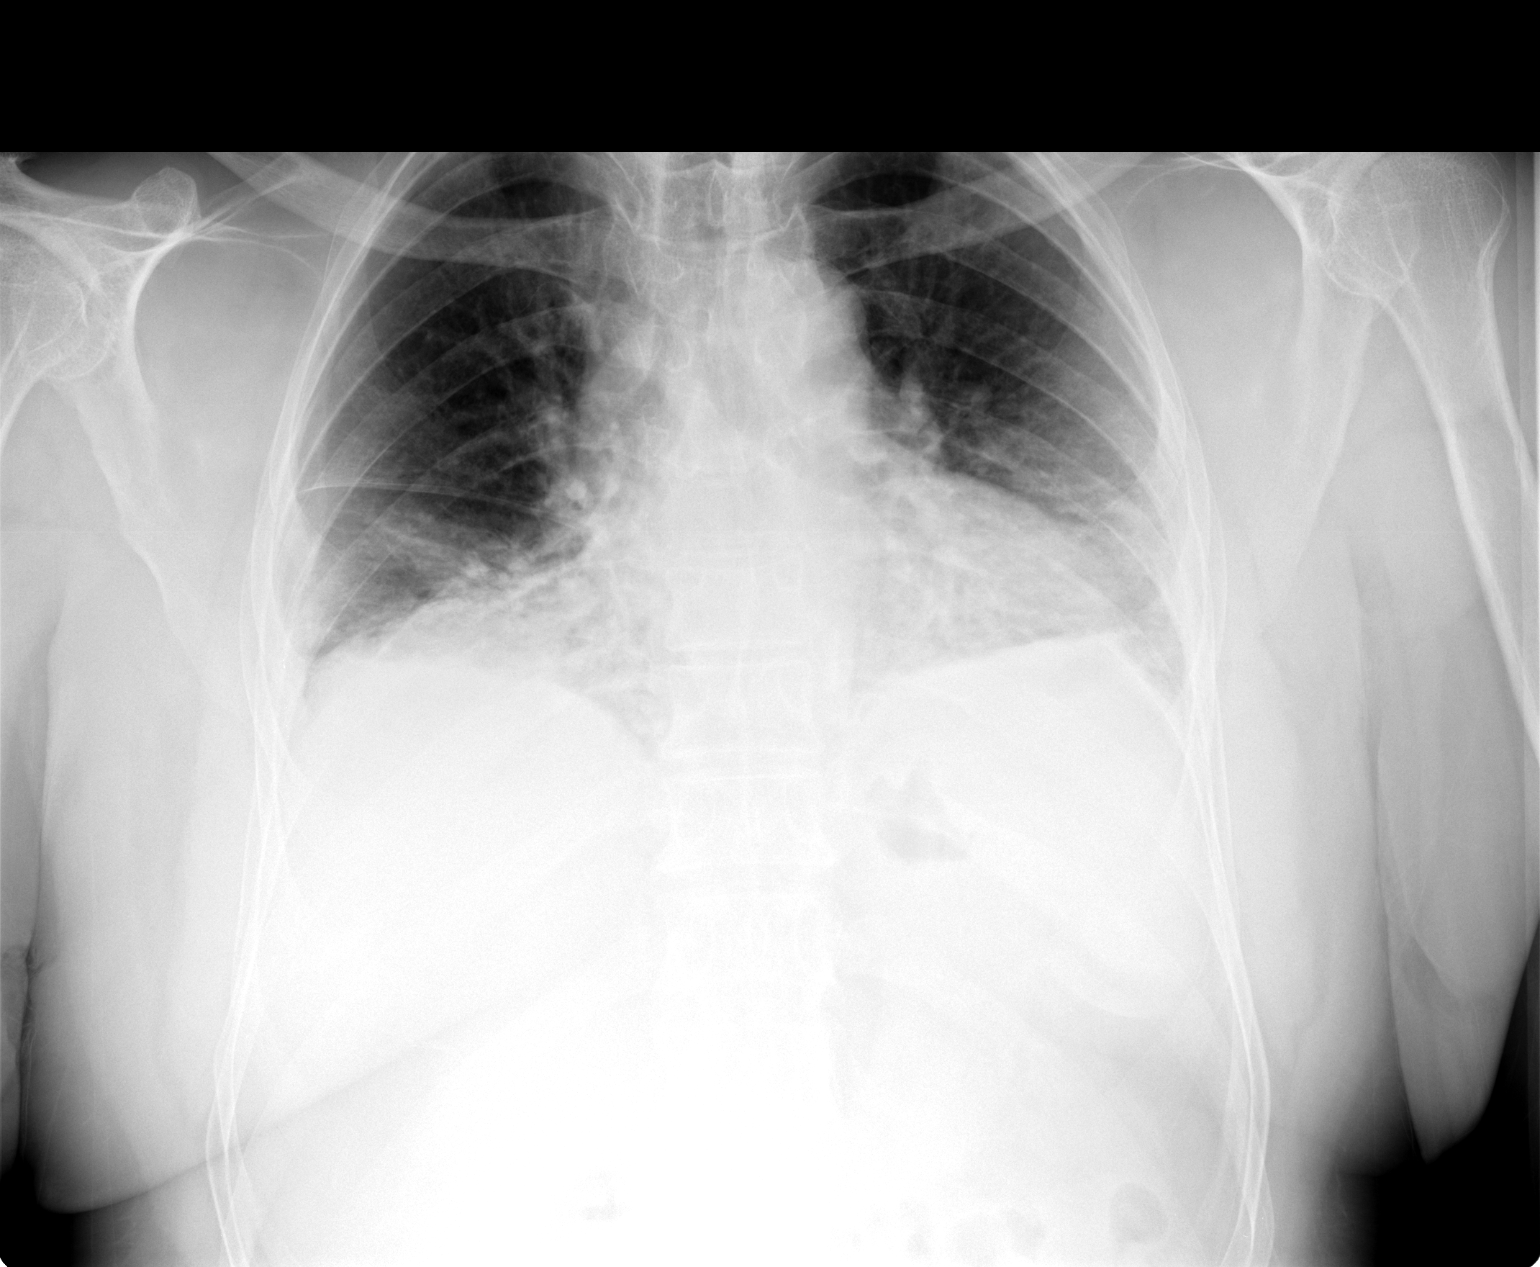

[1 of 1 positions shown; findings below may reference images not displayed]

FINDINGS: There are bibasilar airspace opacities, atelectasis versus
infiltrates. Findings similar to prior study. There is mild cardiomegaly.
IMPRESSION: Cardiomegaly. Bibasilar atelectasis or infiltrates.

## 2004-07-27 ENCOUNTER — Emergency Department (HOSPITAL_COMMUNITY): Admission: EM | Admit: 2004-07-27 | Discharge: 2004-07-27 | Payer: Self-pay | Admitting: Emergency Medicine

## 2004-07-27 IMAGING — CT CT ABDOMEN W/ CM
1 of 2 series · 15 of 32 positions shown, 19 images · IV contrast (omnipaque)
Comparison: [DATE].

CLINICAL DATA: Right abdominal pain - status post cholecystectomy [DATE].
TECHNIQUE: Multidetector helical imaging carried out with oral and IV contrast - 125 cc Omnipaque 300.

[Series 3: abd_pel 5.0 b30f st · axial · 0.79mm/px · z∈[-524,-78]mm · 15 of 99 slices shown, 19 images]
[im 5/99  soft-tissue]
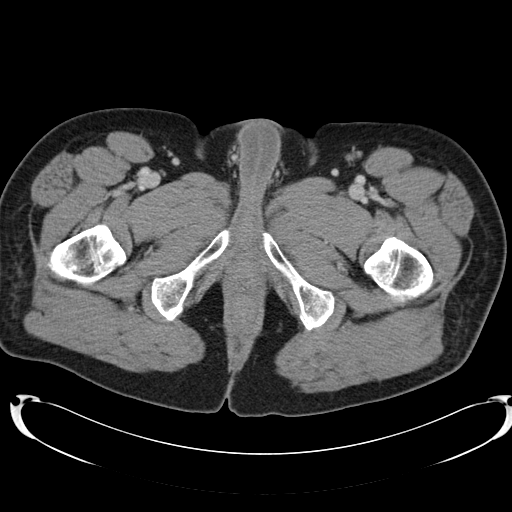
[im 5/99  bone]
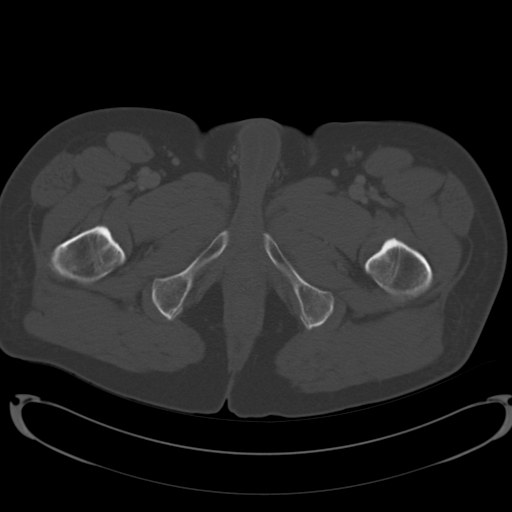
[im 13/99  soft-tissue]
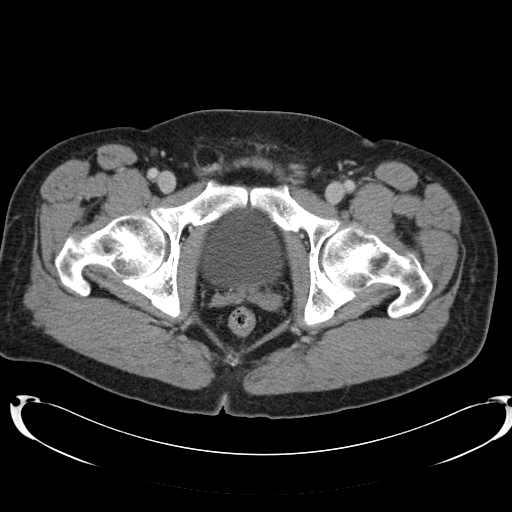
[im 22/99  soft-tissue]
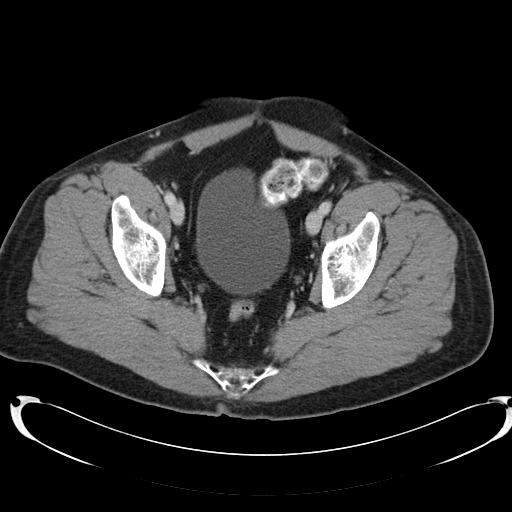
[im 26/99  soft-tissue]
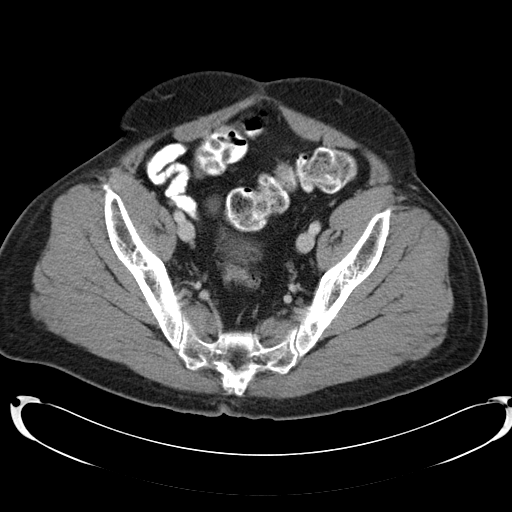
[im 35/99  soft-tissue]
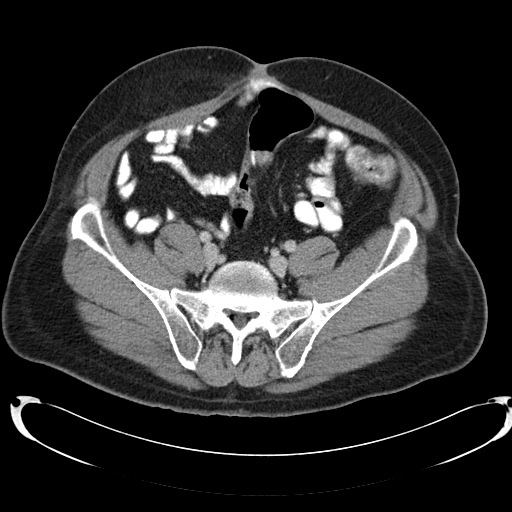
[im 43/99  soft-tissue]
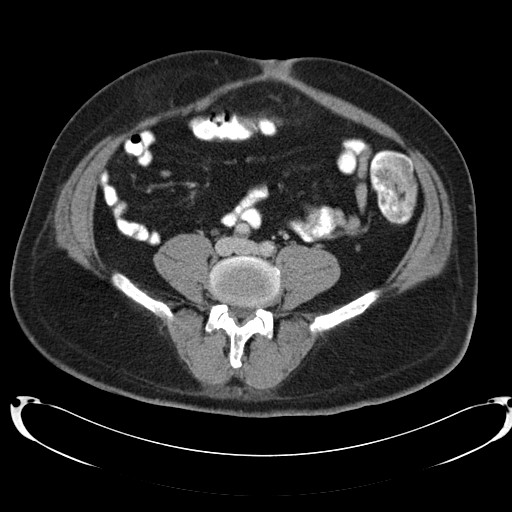
[im 52/99  soft-tissue]
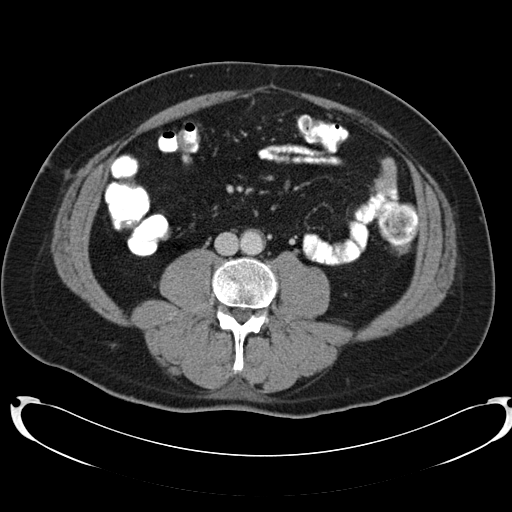
[im 56/99  soft-tissue]
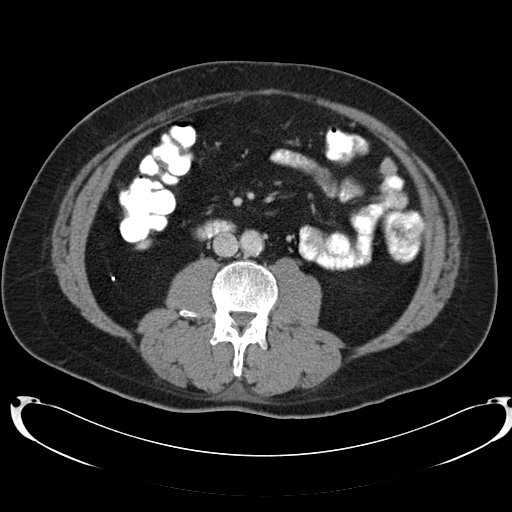
[im 64/99  soft-tissue]
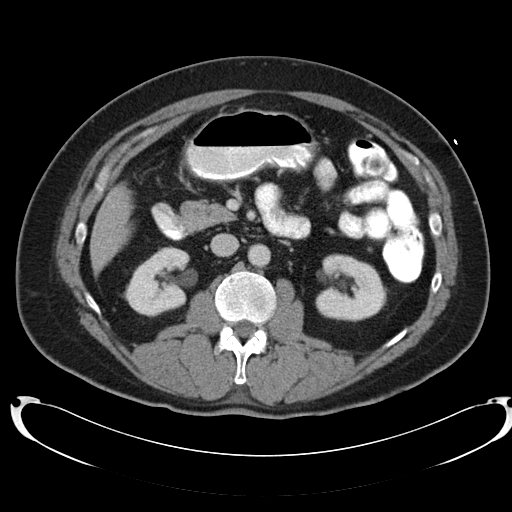
[im 64/99  bone]
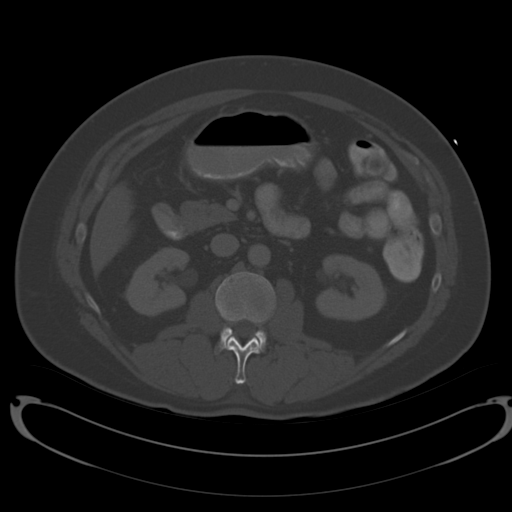
[im 73/99  soft-tissue]
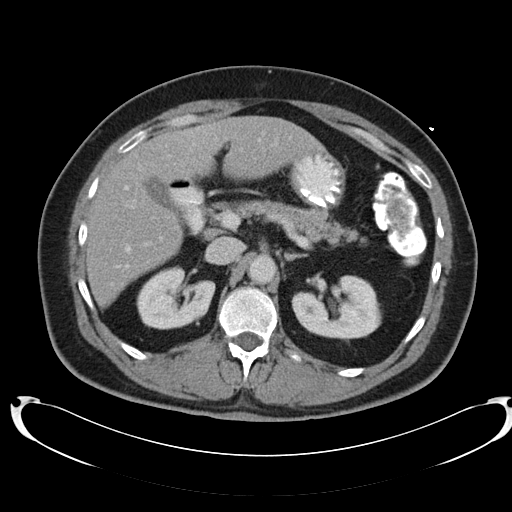
[im 77/99  soft-tissue]
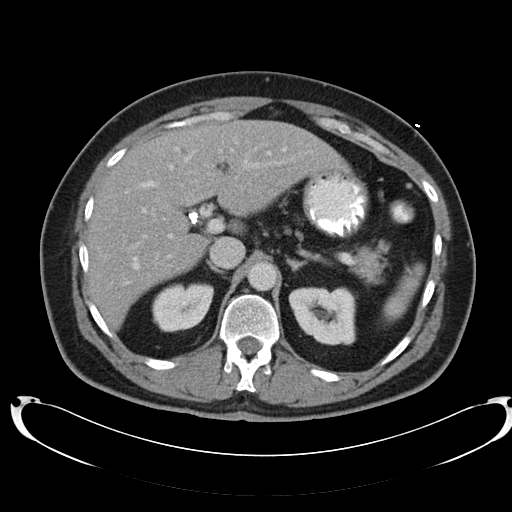
[im 81/99  lung]
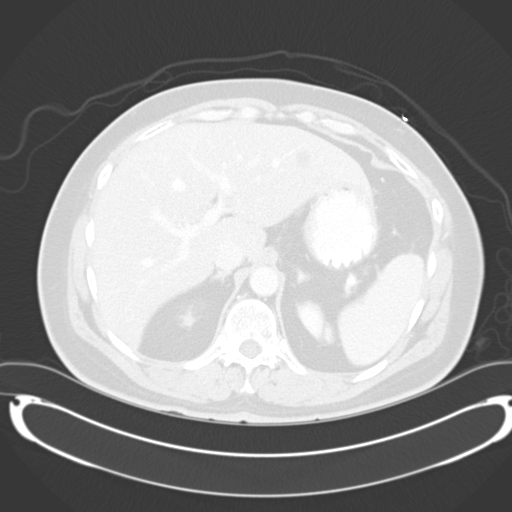
[im 86/99  soft-tissue]
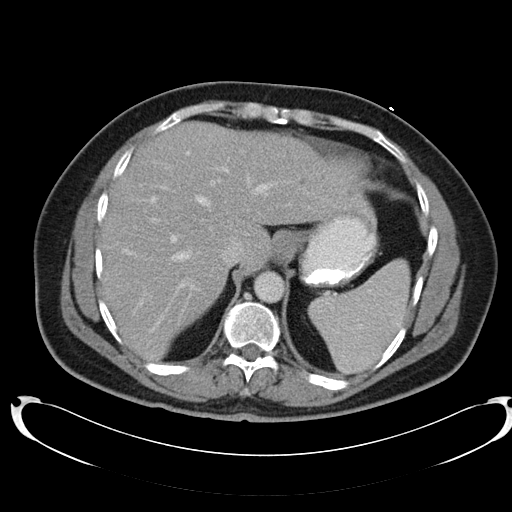
[im 86/99  lung]
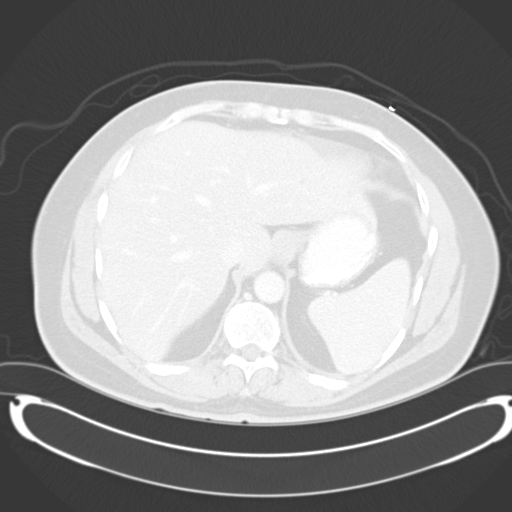
[im 90/99  lung]
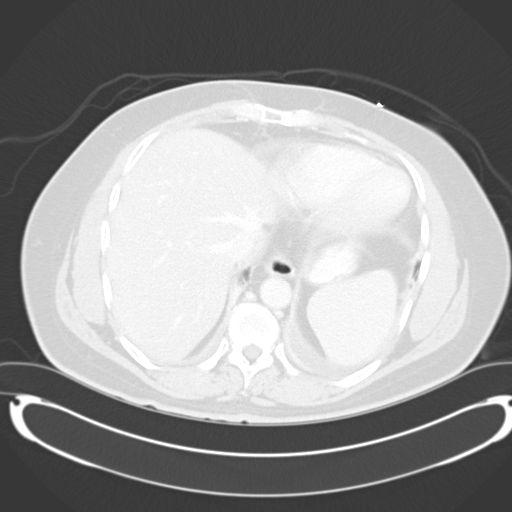
[im 94/99  soft-tissue]
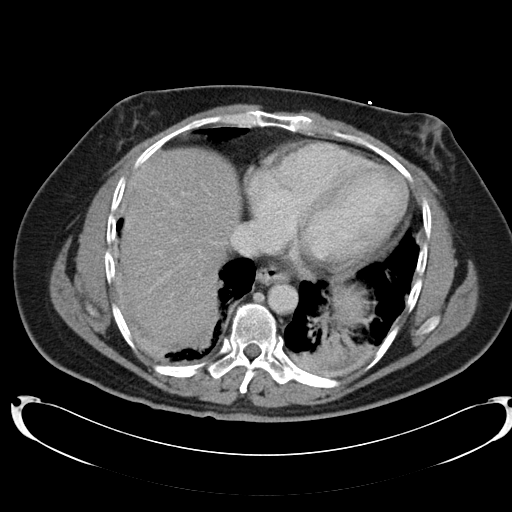
[im 94/99  lung]
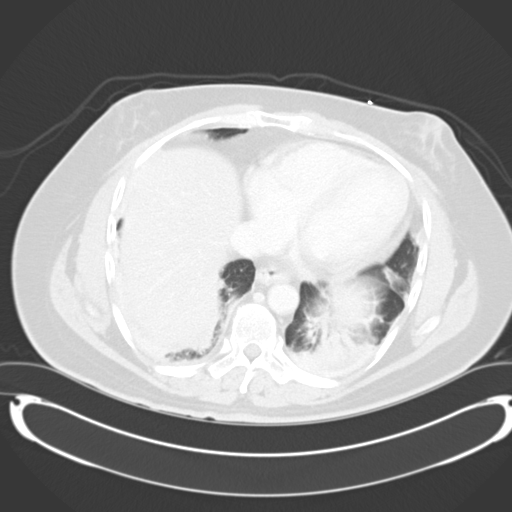

[15 of 32 positions shown; findings below may reference images not displayed]

ABDOMEN CT WITH CONTRAST: 
 Highest cuts include the lung bases.  There is dense consolidation at the right base, significantly increased when compared to the prior study. 
 There are scattered low attenuation lesions of the liver that were noted previously.  These look like cysts.  There is a small pear-shaped density in the gallbladder fossa measuring approximately 35 x 18 mm.  In the post-operative setting, it is difficult to assess if this is a hematoma in the gallbladder fossa, or if it represents a bile leak.  I would favor the former.  This will need to be followed clinically.   A HIDA study could be of value if a bile leak is clinically suspected.  
 Spleen, pancreas, and adrenals normal.  Early and delayed images of the kidneys unremarkable.  There is a left renal cyst.  
 No adenopathy or ascites.
IMPRESSION: 1.  Right lower lobe atelectasis or atelectatic pneumonia - new finding. 
 2.  Small pear-shaped density in the gallbladder fossa.  Probable post-op hematoma or fibrotic reaction.  Doubt biloma but cannot exclude that possibility.  
 PELVIS CT WITH CONTRAST: 
 No focal masses, adenopathy, or fluid collection.  Pelvic sidewalls and pre-sacral space normal. Bladder normal.
IMPRESSION: Unremarkable.

## 2004-07-27 IMAGING — CR DG ABDOMEN ACUTE W/ 1V CHEST
3 series · 3 of 3 positions shown · non-contrast
Comparison: None.

CLINICAL DATA: Right upper quadrant pain.  Cholecystectomy two weeks ago.  
 ACUTE ABDOMEN SERIES:

[view not recorded (1 of 3)]
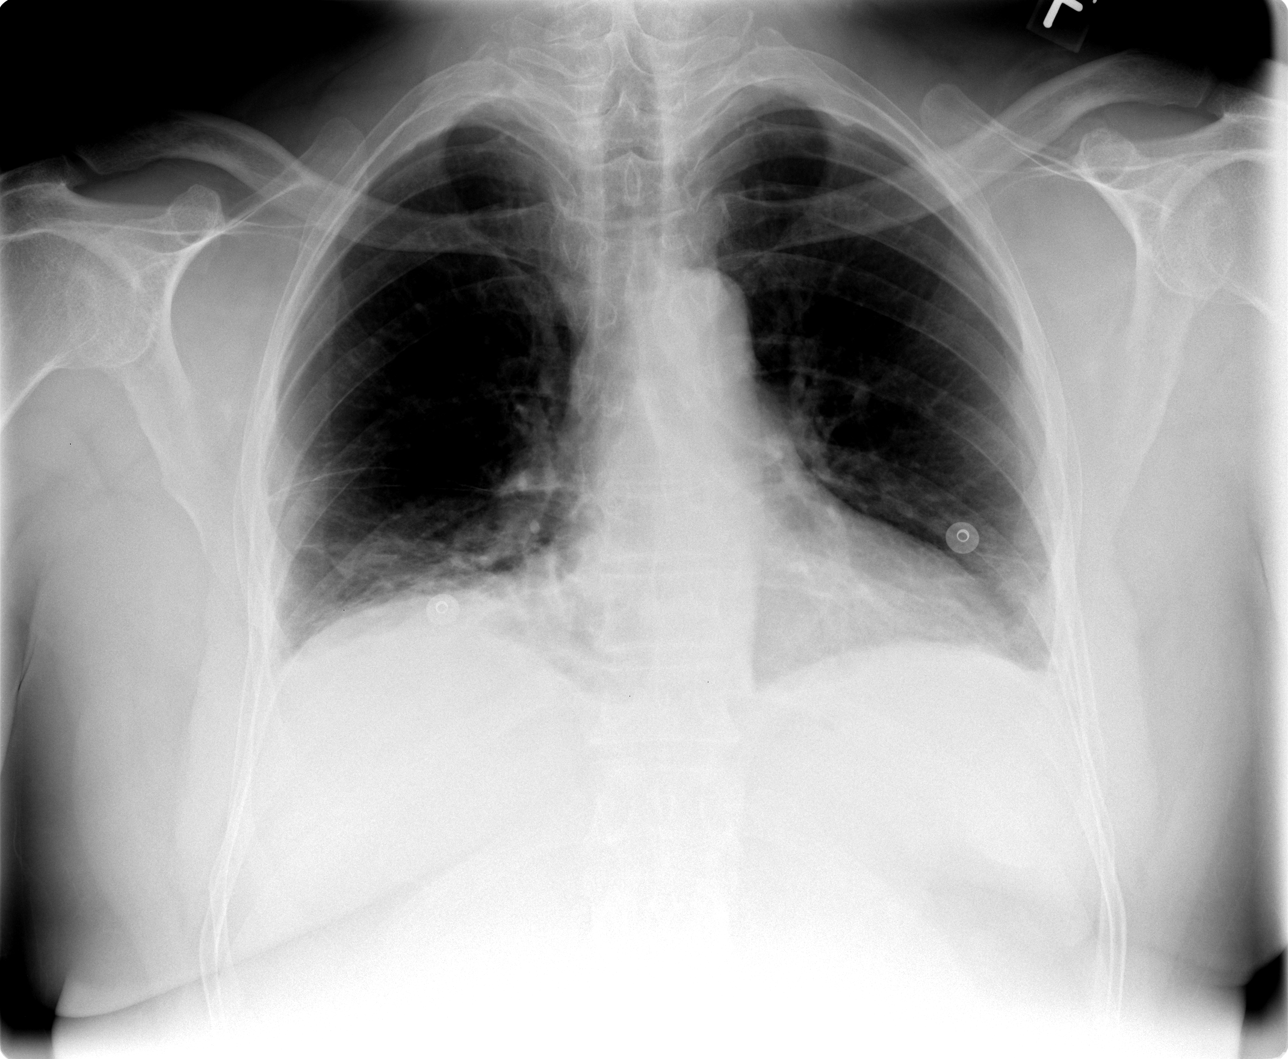

[view not recorded (2 of 3)]
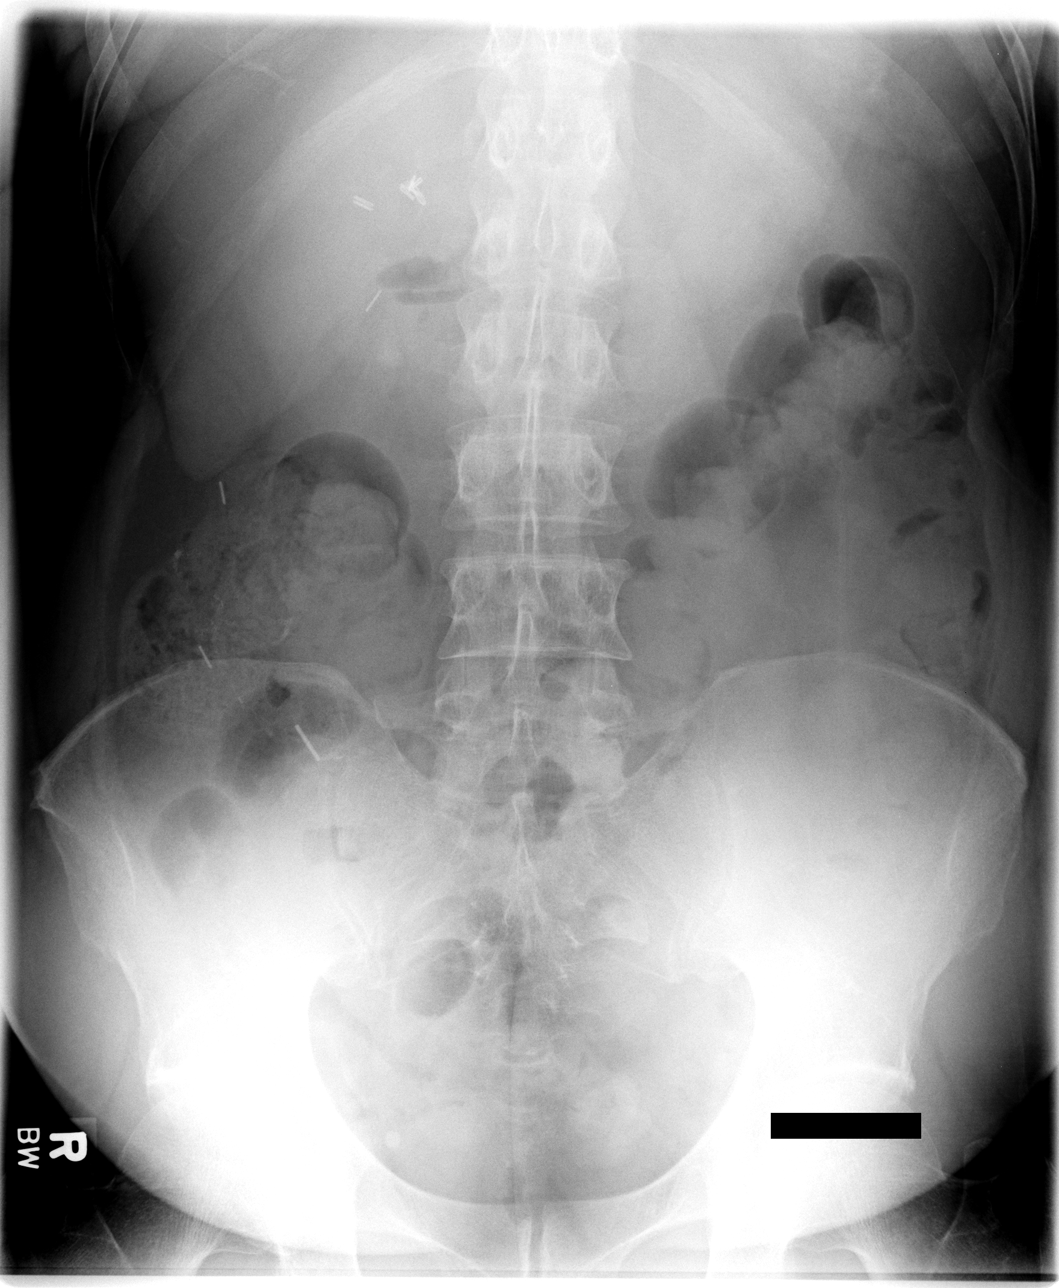

[view not recorded (3 of 3)]
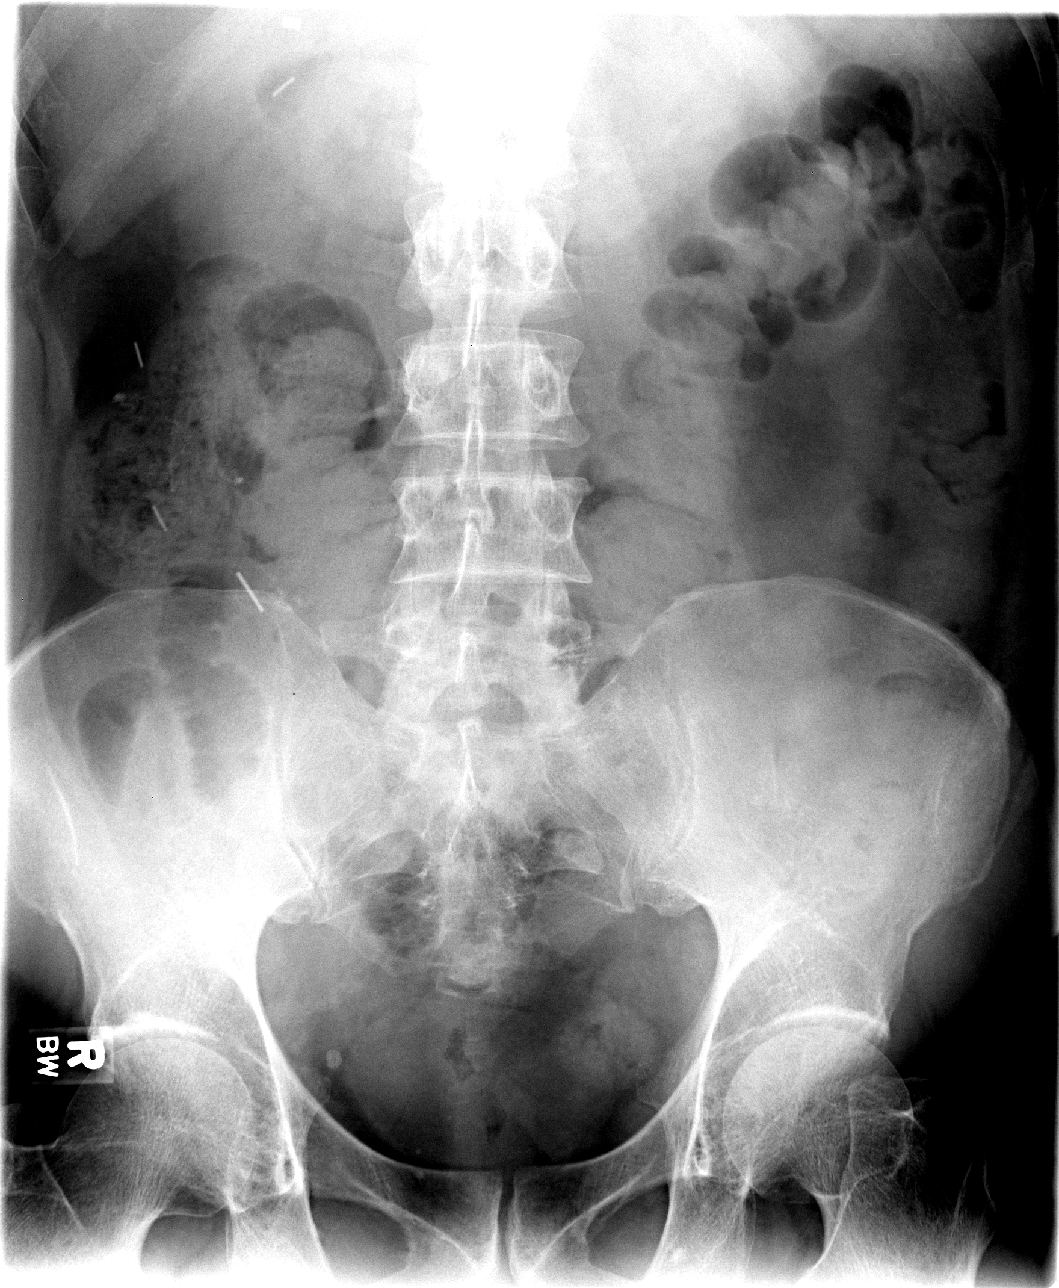

[3 of 3 positions shown; findings below may reference images not displayed]

FINDINGS: Frontal chest shows bibasilar atelectasis with low lung volumes.  Low volume film accentuates the cardiac silhouette.  There is no focal infiltrate or overt pulmonary edema. 
 Supine and upright views of the abdomen are without evidence of intraperitoneal free air.  There is no evidence for bowel dilatation to suggest obstruction.  Multiple surgical clips are seen in the right upper quadrant and right abdomen.  
 Focal air collection in the right medial upper abdomen near the cholecystectomy bed has apparent air fluid levels on the upright film.  This may be related to the duodenal bulb, but air collection in the gallbladder fossa suggesting abscess cannot be excluded.
IMPRESSION: 1.  Low volume film with bibasilar atelectasis. 
 2.  Focal air collection, possibly with air fluid levels in the medial right upper quadrant.  Please correlate clinically and if there is concern for an abscess, CT scanning would be a more sensitive means to characterize.

## 2005-02-25 ENCOUNTER — Emergency Department (HOSPITAL_COMMUNITY): Admission: EM | Admit: 2005-02-25 | Discharge: 2005-02-25 | Payer: Self-pay | Admitting: Emergency Medicine

## 2005-03-31 ENCOUNTER — Emergency Department (HOSPITAL_COMMUNITY): Admission: EM | Admit: 2005-03-31 | Discharge: 2005-04-01 | Payer: Self-pay | Admitting: Emergency Medicine

## 2005-03-31 IMAGING — CT CT ANGIO CHEST
3 of 4 series · 11 of 38 positions shown · IV contrast (APPLIED)
Comparison: none

CLINICAL DATA: Shortness of breath.  Cough.  Known left leg DVT.  Suspect pulmonary embolism.
 CT ANGIOGRAPHY OF CHEST:
TECHNIQUE: Multidetector CT imaging of the chest was performed during bolus injection of intravenous contrast.  Multiplanar CT angiographic image reconstructions were generated to evaluate the vascular anatomy.
 Contrast:  80 cc Omnipaque 300.

[Series 5: pe 3.0 b40f st · axial · 0.62mm/px · z∈[-210,-198]mm · 2 of 97 slices shown]
[im 49/97  lung]
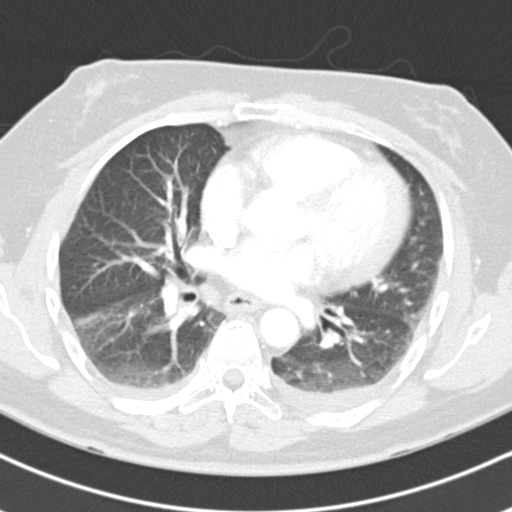
[im 53/97  lung]
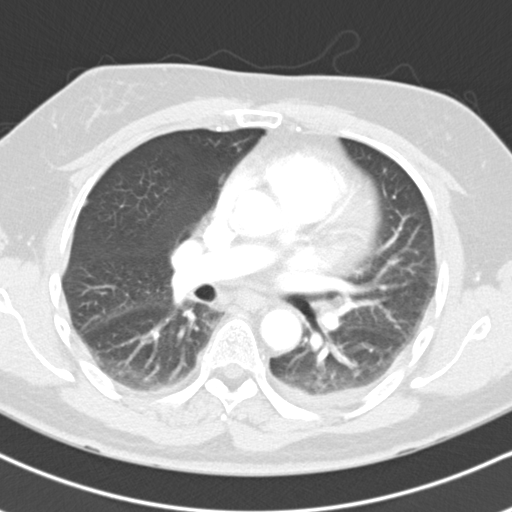

[Series 6: pe 1.0 b20f st · axial · 0.62mm/px · z∈[-305,-111]mm · 6 of 292 slices shown]
[im 49/292  lung]
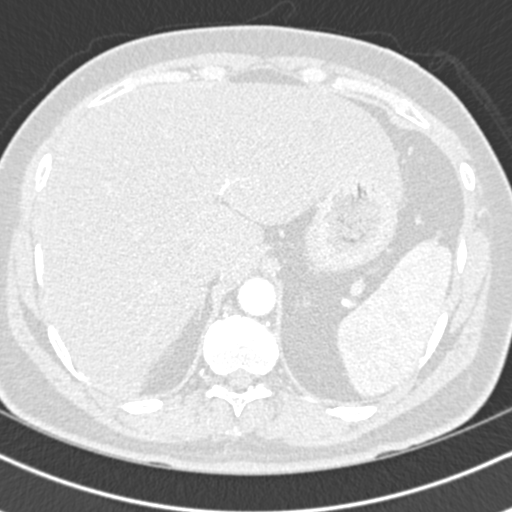
[im 98/292  mediastinal]
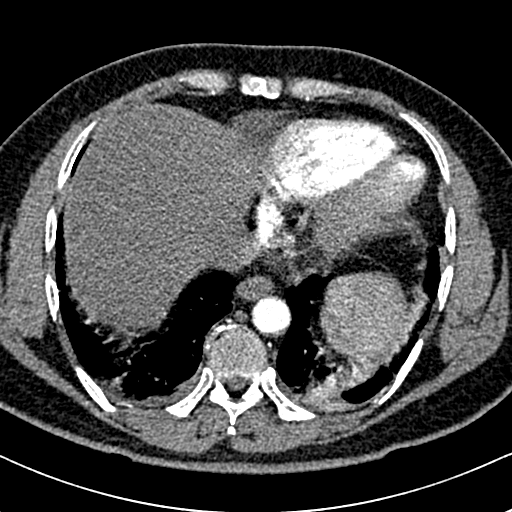
[im 146/292  lung]
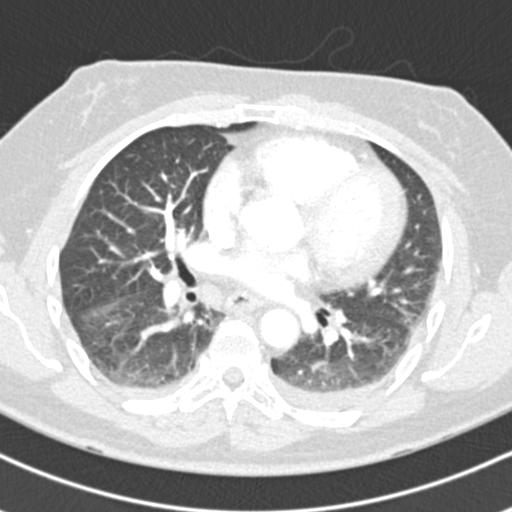
[im 153/292  mediastinal]
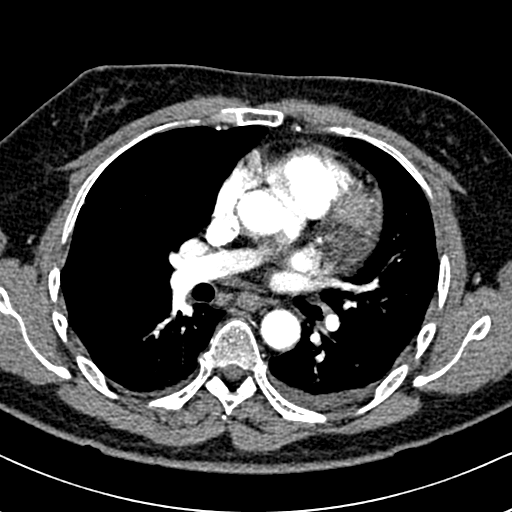
[im 195/292  lung]
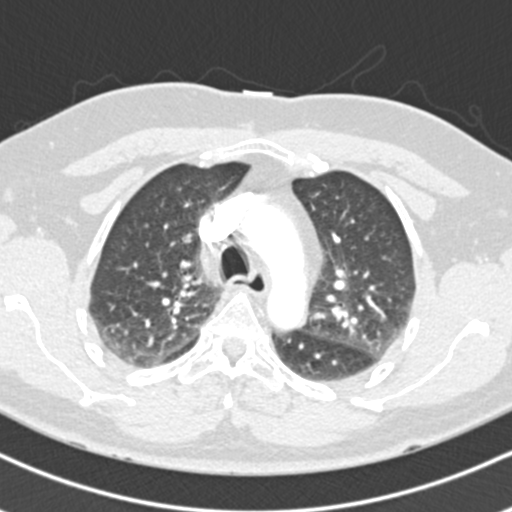
[im 243/292  mediastinal]
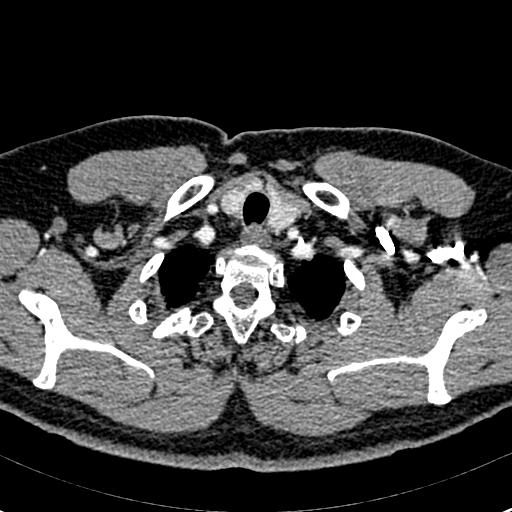

[Series 605: radial flip · axial · 0.62mm/px · z∈[-238,-135]mm · 3 of 40 slices shown]
[im 16/40  mediastinal]
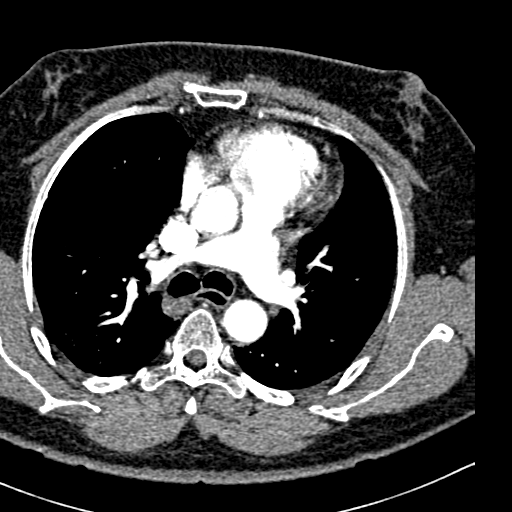
[im 19/40  mediastinal]
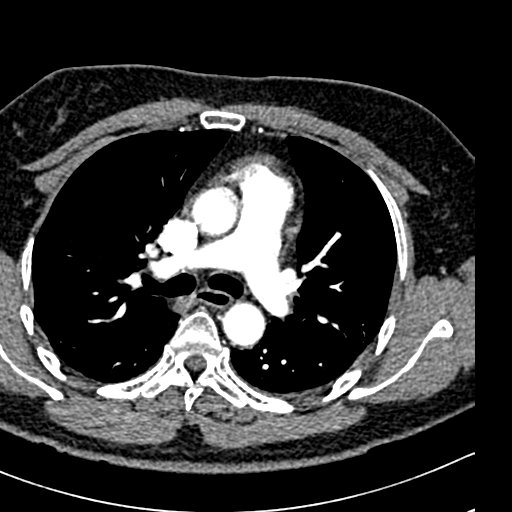
[im 24/40  mediastinal]
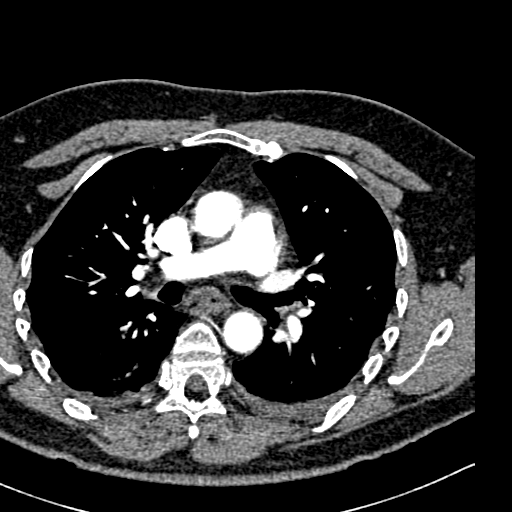

[11 of 38 positions shown; findings below may reference images not displayed]

FINDINGS: Satisfactory opacification of the pulmonary arteries is seen, and there is no evidence of acute pulmonary embolism.  There is no evidence of thoracic aortic aneurysm or dissection.  There is no evidence of hilar or mediastinal masses or adenopathy.  
 Tiny pleural effusions are present bilaterally.  Mild atelectasis is also seen, mainly in the dependent lung bases bilaterally.  There is no evidence of pulmonary consolidation.  There is no evidence of pulmonary mass or adenopathy.
IMPRESSION: 1.  No evidence of pulmonary embolism.  
 2.  Mild dependent bibasilar atelectasis and tiny bilateral pleural effusions.

## 2005-03-31 IMAGING — CR DG CHEST 2V
2 series · 2 of 2 positions shown · non-contrast
Comparison: [DATE].

CLINICAL DATA: Short of breath.  Hypertension.  
 CHEST - 2 VIEW:

[w chest pa]
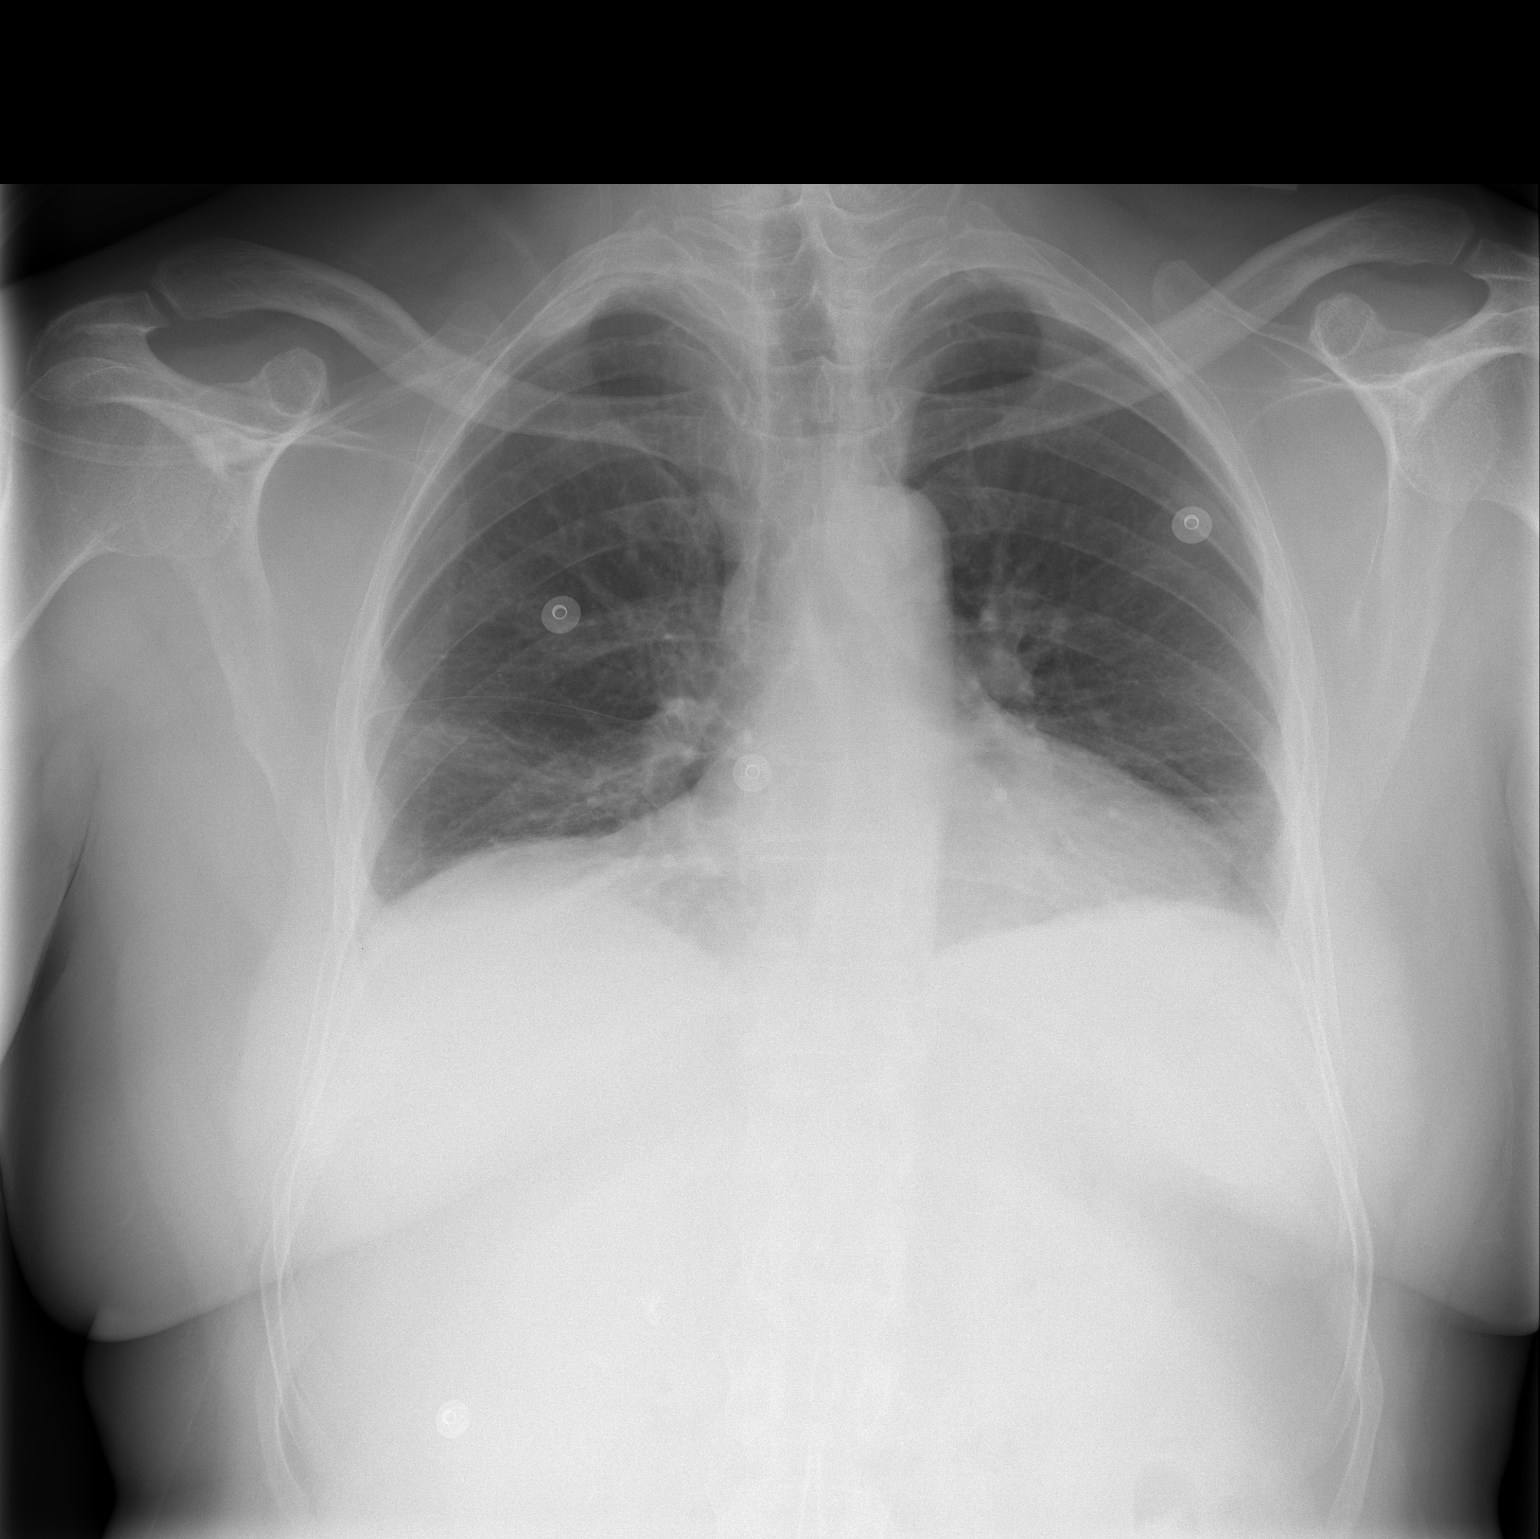

[w chest lat]
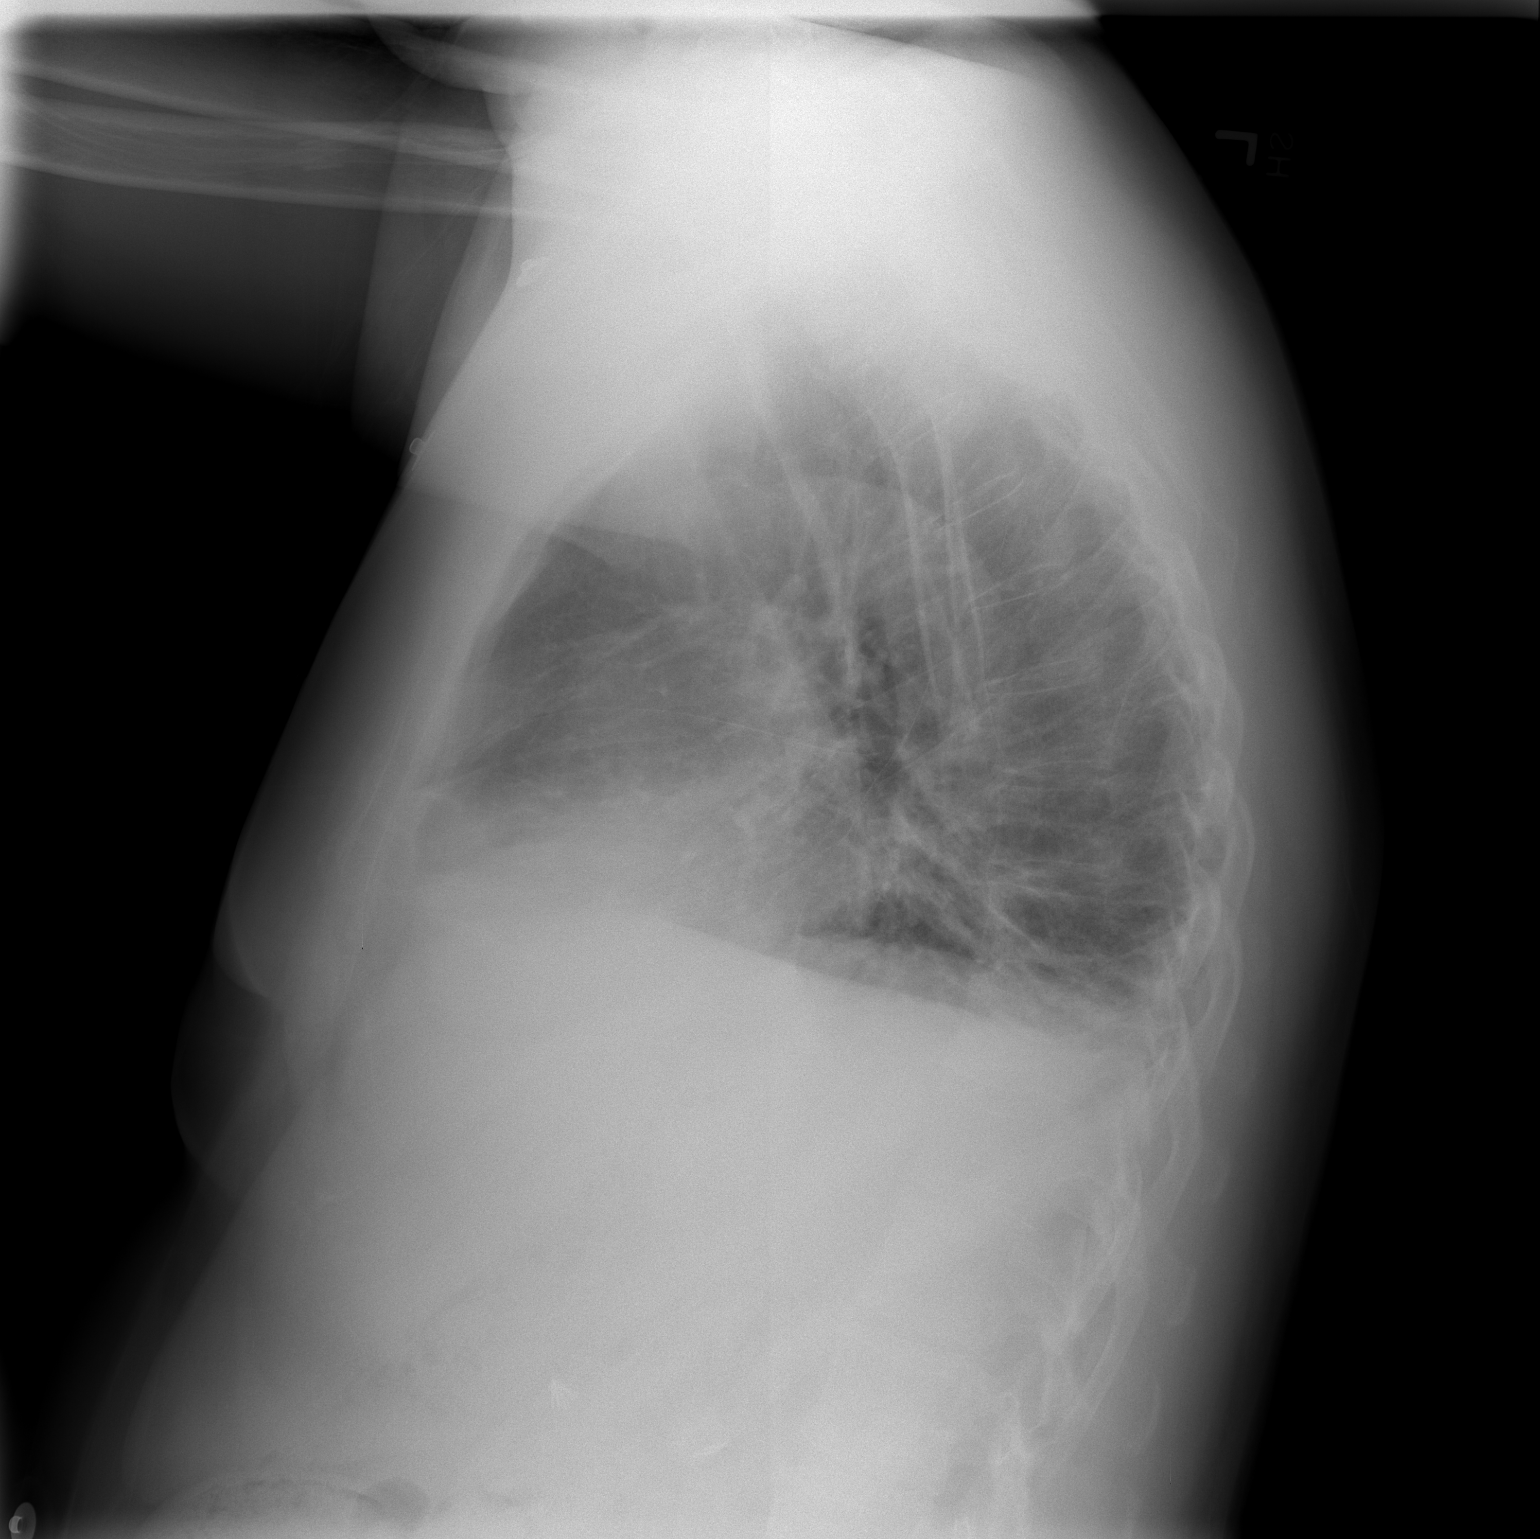

[2 of 2 positions shown; findings below may reference images not displayed]

FINDINGS: Low lung volumes are seen as well as mild bibasilar atelectasis versus scarring.  There is no evidence of pulmonary consolidation or edema.  There is no evidence of pleural effusion.  Heart size is within normal limits allowing for low lung volumes.
IMPRESSION: Low inspiratory lung volumes and bibasilar atelectasis versus scarring.

## 2008-08-03 ENCOUNTER — Encounter: Admission: RE | Admit: 2008-08-03 | Discharge: 2008-08-03 | Payer: Self-pay | Admitting: Internal Medicine

## 2008-08-03 IMAGING — CR DG CHEST 2V
2 series · 2 of 2 positions shown · non-contrast
Comparison: CT chest and chest x-ray [DATE]

CLINICAL DATA: Right anterior chest pain.

CHEST - 2 VIEW

[view not recorded (1 of 2)]
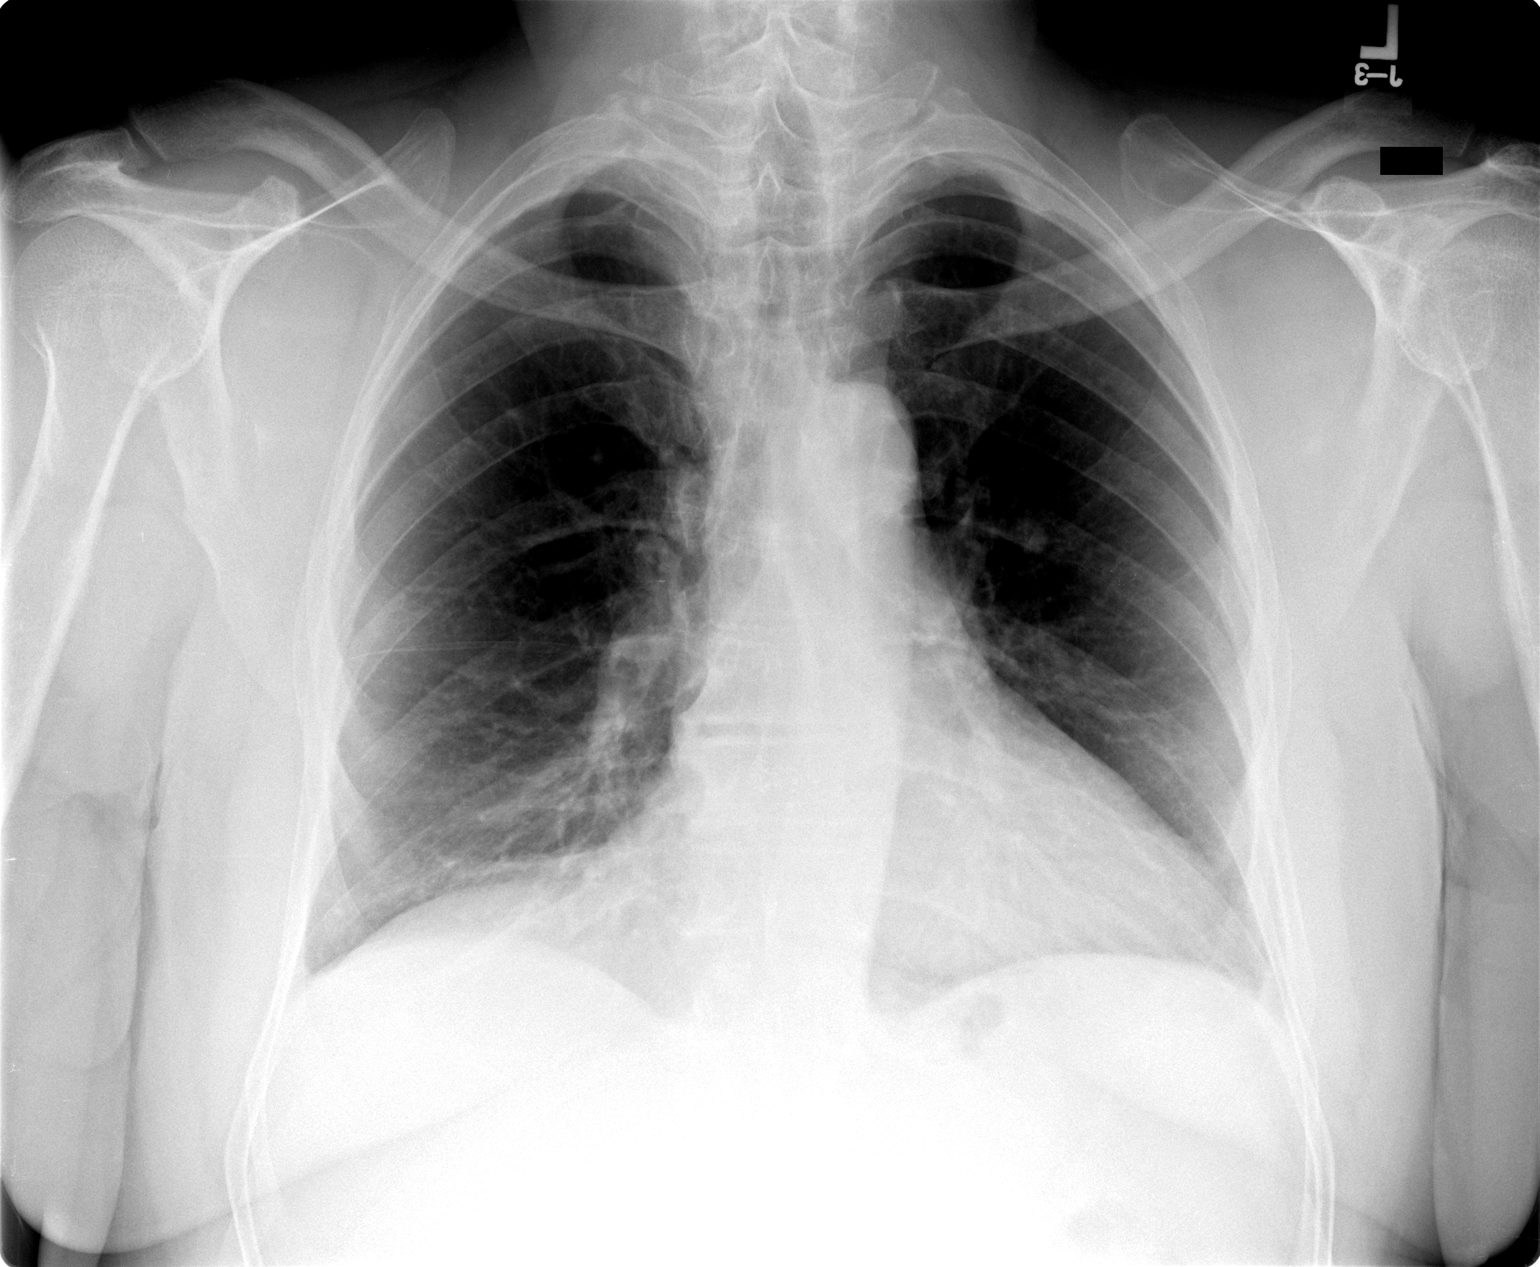

[view not recorded (2 of 2)]
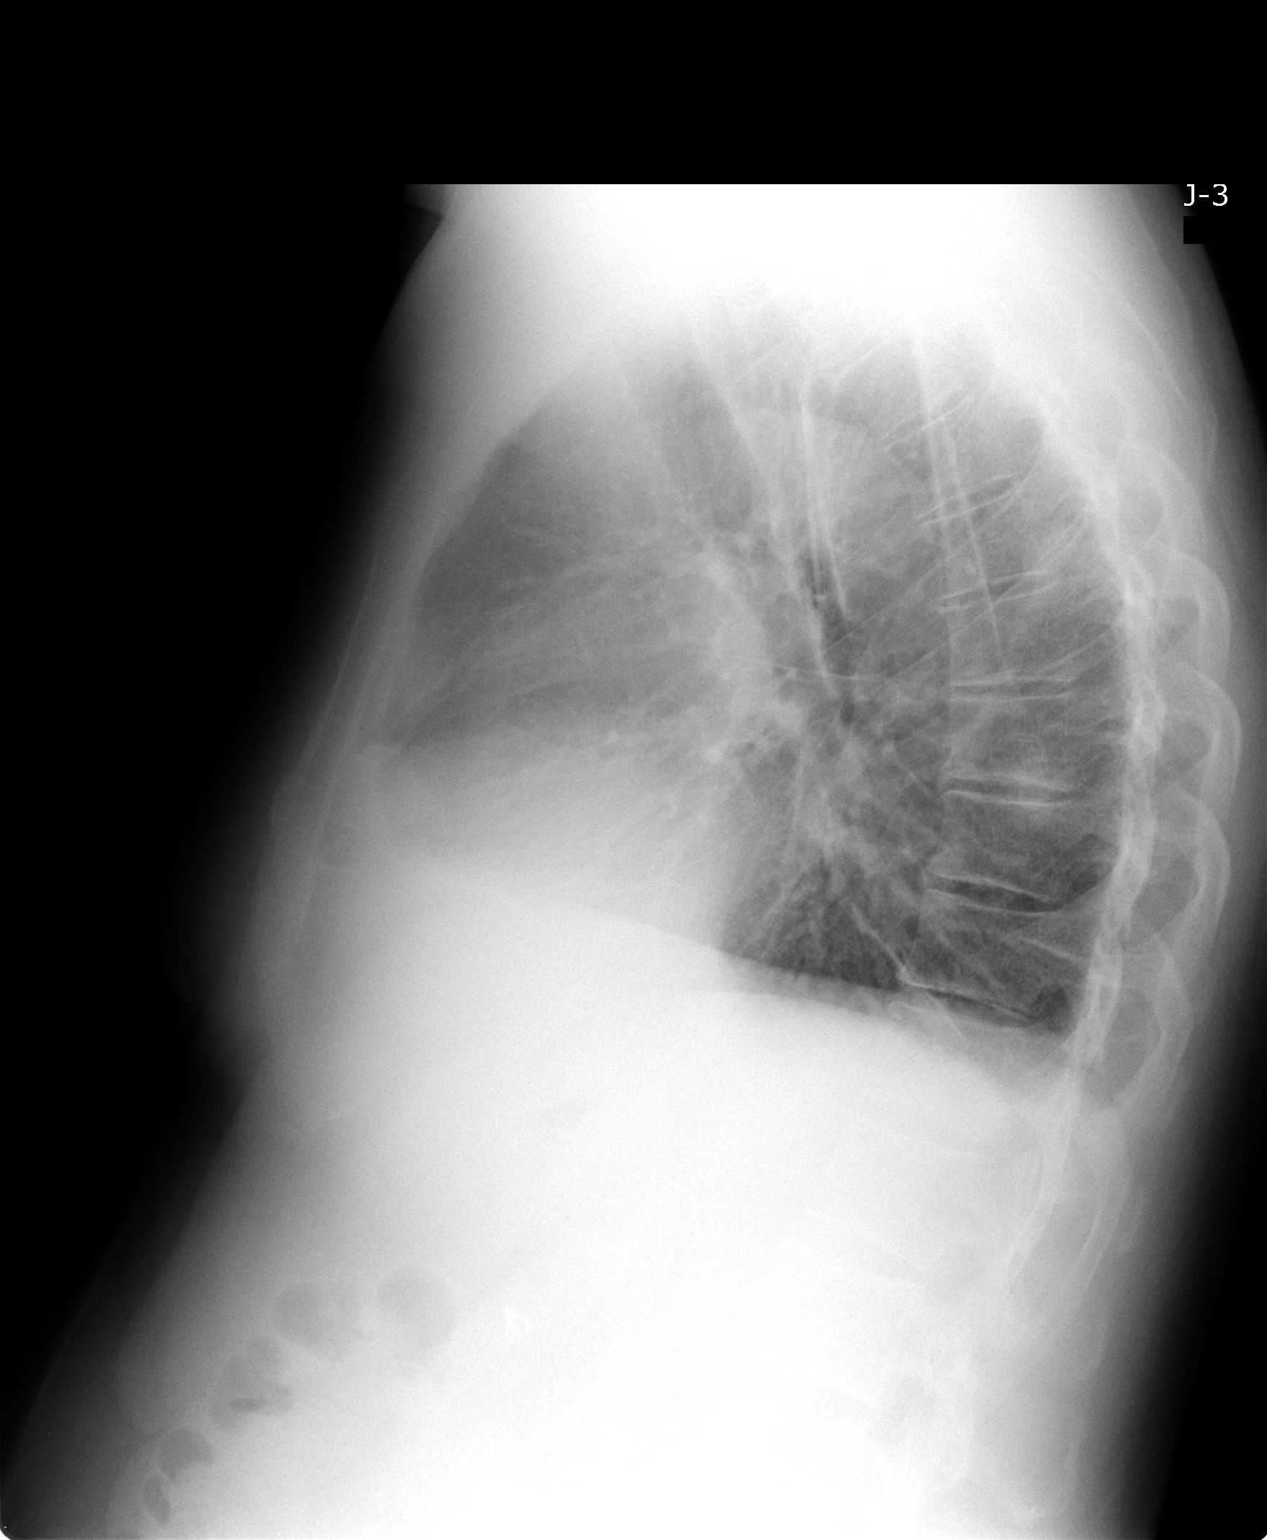

[2 of 2 positions shown; findings below may reference images not displayed]

FINDINGS: Trachea is midline.  Heart is enlarged.  Biapical pleural
thickening.  Mild bibasilar scarring.  Lungs otherwise clear.  No
pleural fluid.
IMPRESSION: No acute findings.

REF:G3 DICTATED: [DATE] [DATE]

## 2008-08-19 ENCOUNTER — Emergency Department (HOSPITAL_COMMUNITY): Admission: EM | Admit: 2008-08-19 | Discharge: 2008-08-19 | Payer: Self-pay | Admitting: Emergency Medicine

## 2008-08-19 ENCOUNTER — Encounter (INDEPENDENT_AMBULATORY_CARE_PROVIDER_SITE_OTHER): Payer: Self-pay | Admitting: Emergency Medicine

## 2008-08-19 ENCOUNTER — Ambulatory Visit: Payer: Self-pay | Admitting: Surgery

## 2008-08-19 IMAGING — CR DG CHEST 2V
1 series · 1 of 1 positions shown · non-contrast
Comparison: [DATE]

CLINICAL DATA: Left leg pain, hypertension

CHEST - 2 VIEW

[w chest pa]
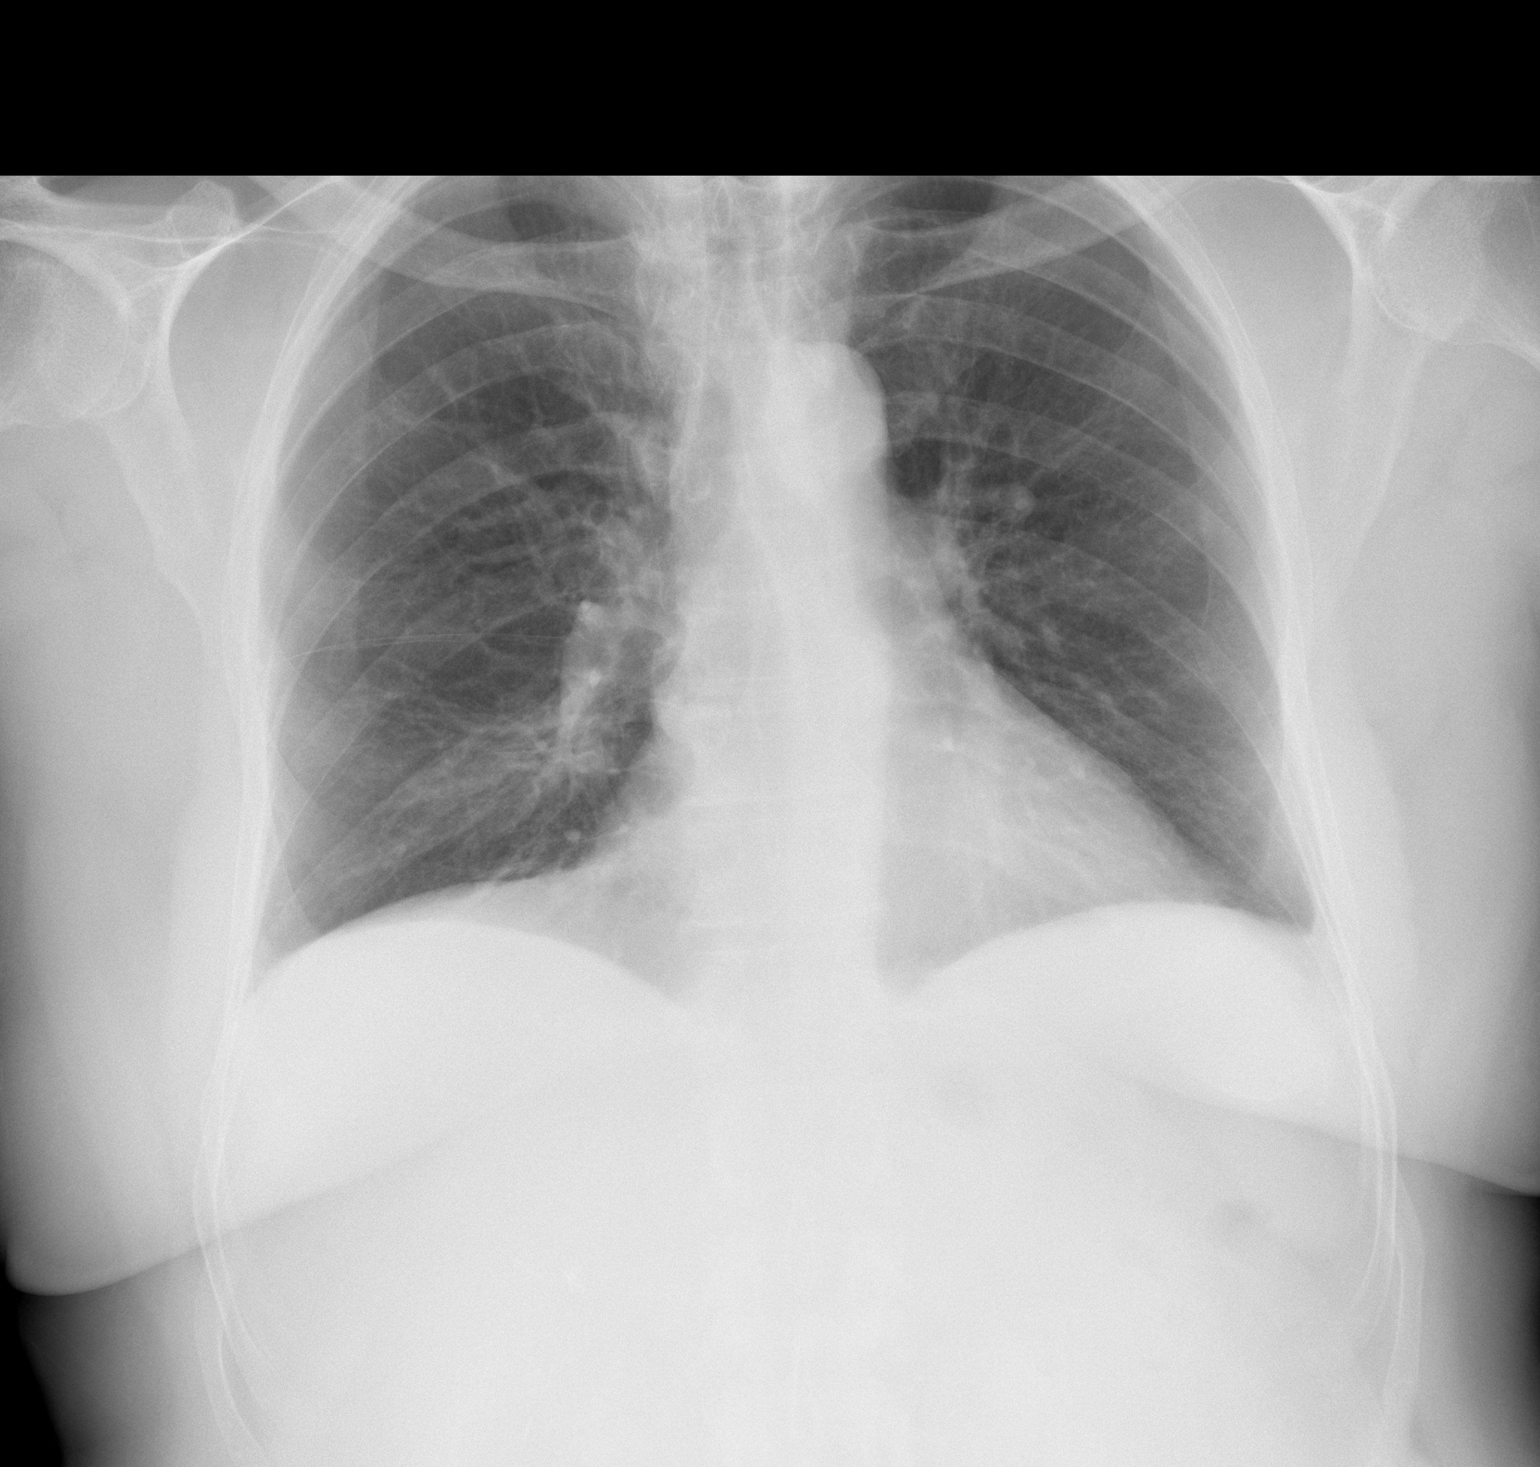

[1 of 1 positions shown; findings below may reference images not displayed]

FINDINGS: Mild bronchial thickening and basilar atelectasis versus
scarring.  No focal pneumonia, edema, effusion or pneumothorax.
Midline trachea.  Normal heart size and vascularity.
IMPRESSION: Stable mild bronchial thickening and basilar atelectasis versus
scarring.
No superimposed acute process

## 2008-11-30 ENCOUNTER — Encounter: Payer: Self-pay | Admitting: Internal Medicine

## 2009-01-12 ENCOUNTER — Encounter: Payer: Self-pay | Admitting: Internal Medicine

## 2009-01-26 DIAGNOSIS — K5090 Crohn's disease, unspecified, without complications: Secondary | ICD-10-CM

## 2009-01-26 HISTORY — DX: Crohn's disease, unspecified, without complications: K50.90

## 2009-01-27 ENCOUNTER — Ambulatory Visit: Payer: Self-pay | Admitting: Internal Medicine

## 2009-01-27 DIAGNOSIS — J309 Allergic rhinitis, unspecified: Secondary | ICD-10-CM | POA: Insufficient documentation

## 2009-01-27 DIAGNOSIS — R0602 Shortness of breath: Secondary | ICD-10-CM | POA: Insufficient documentation

## 2009-01-27 DIAGNOSIS — E782 Mixed hyperlipidemia: Secondary | ICD-10-CM

## 2009-01-27 DIAGNOSIS — I739 Peripheral vascular disease, unspecified: Secondary | ICD-10-CM

## 2009-01-27 DIAGNOSIS — R42 Dizziness and giddiness: Secondary | ICD-10-CM

## 2009-01-27 DIAGNOSIS — I10 Essential (primary) hypertension: Secondary | ICD-10-CM | POA: Insufficient documentation

## 2009-01-27 DIAGNOSIS — J438 Other emphysema: Secondary | ICD-10-CM | POA: Insufficient documentation

## 2009-01-27 HISTORY — DX: Allergic rhinitis, unspecified: J30.9

## 2009-01-27 HISTORY — DX: Peripheral vascular disease, unspecified: I73.9

## 2009-01-27 HISTORY — DX: Essential (primary) hypertension: I10

## 2009-01-27 HISTORY — DX: Dizziness and giddiness: R42

## 2009-01-27 HISTORY — DX: Shortness of breath: R06.02

## 2009-01-27 HISTORY — DX: Mixed hyperlipidemia: E78.2

## 2009-03-08 ENCOUNTER — Encounter (HOSPITAL_COMMUNITY): Admission: RE | Admit: 2009-03-08 | Discharge: 2009-06-06 | Payer: Self-pay | Admitting: Internal Medicine

## 2009-04-15 ENCOUNTER — Encounter: Payer: Self-pay | Admitting: Internal Medicine

## 2009-05-10 ENCOUNTER — Ambulatory Visit: Payer: Self-pay | Admitting: Internal Medicine

## 2009-06-07 ENCOUNTER — Encounter (HOSPITAL_COMMUNITY): Admission: RE | Admit: 2009-06-07 | Discharge: 2009-06-18 | Payer: Self-pay | Admitting: Internal Medicine

## 2009-06-19 ENCOUNTER — Encounter (HOSPITAL_COMMUNITY): Admission: RE | Admit: 2009-06-19 | Discharge: 2009-06-28 | Payer: Self-pay | Admitting: Internal Medicine

## 2009-06-29 ENCOUNTER — Encounter (HOSPITAL_COMMUNITY): Admission: RE | Admit: 2009-06-29 | Discharge: 2009-09-27 | Payer: Self-pay | Admitting: Internal Medicine

## 2009-09-17 ENCOUNTER — Encounter: Payer: Self-pay | Admitting: Internal Medicine

## 2009-09-27 ENCOUNTER — Ambulatory Visit: Payer: Self-pay | Admitting: Internal Medicine

## 2009-09-28 ENCOUNTER — Encounter (HOSPITAL_COMMUNITY): Admission: RE | Admit: 2009-09-28 | Discharge: 2009-10-18 | Payer: Self-pay | Admitting: Internal Medicine

## 2010-07-13 ENCOUNTER — Encounter: Payer: Self-pay | Admitting: Internal Medicine

## 2010-07-13 ENCOUNTER — Ambulatory Visit
Admission: RE | Admit: 2010-07-13 | Discharge: 2010-07-13 | Payer: Self-pay | Source: Home / Self Care | Attending: Internal Medicine | Admitting: Internal Medicine

## 2010-07-19 NOTE — Miscellaneous (Signed)
Summary: Pulm Progress Note/[facility]  Pulm Progress Note/[facility]   Imported By: Phillis Knack 09/23/2009 11:13:16  _____________________________________________________________________  External Attachment:    Type:   Image     Comment:   External Document

## 2010-07-19 NOTE — Letter (Signed)
Summary: OV/[facility]  OV/[facility]   Imported By: Phillis Knack 01/29/2009 11:40:41  _____________________________________________________________________  External Attachment:    Type:   Image     Comment:   External Document

## 2010-07-19 NOTE — Assessment & Plan Note (Signed)
Summary: ABNORMAL SPIROMETRY///kp   Visit Type:  Initial Consult Copy to:  Dr. Gertie Exon Primary Provider/Referring Provider:  Dr. Benson Setting  CC:  Pulmonary Consult for abnormal spirometry.Marland Kitchen  History of Present Illness: IOV 01/27/2009. 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 REports dyspnea for a "long time". HE and caretaker think dyspnea started 1-2 years ago. Cardiac etiology ruled out by Dr. Irish Lack per care taker. Progressive in nature. Brought on by bending, walking. Effort tolerance is around 100 feet or 1 flight of stairs. Denies dyspnea for taking shower (given bath by caretaker) but will get dyspneic while bending during clothing change. Dyspnea relieved by albuterol and rest. He has associated dizziness with dyspnea. He also gets dizzy when standing up from sitting or supine position; again associated with dyspnea.  He also has rt sided, atypical chest pain for 1-2 years but reportedly cleared by cards.   He saw Dr. Tye Savoy his PMD for above end june 2010. Spirometry showed obstrucion. Was started on albuterol inhaler  and sent here. In office today, he walked <90 feet and was dyspneic but he did not desatrate or become tachycardic.   Of note, reviewed old chart for history and labs   Preventive Screening-Counseling & Management  Alcohol-Tobacco     Smoking Status: quit     Packs/Day: 1.0     Year Started: 1971     Year Quit: 1998  Current Medications (verified): 1)  Proair Hfa 108 (90 Base) Mcg/act Aers (Albuterol Sulfate) .... 2 Puffs Four Times A Day 2)  Trilipix 135 Mg Cpdr (Choline Fenofibrate) .... One By Mouth Every Other Day 3)  Perphenazine 4 Mg Tabs (Perphenazine) .... Take 1 Tablet By Mouth Once A Day 4)  Uroxatral 10 Mg Xr24h-Tab (Alfuzosin Hcl) .... Take 1 Tablet By Mouth Once A Day 5)  Pentasa 250 Mg Cr-Caps (Mesalamine) .... Take 1 Tablet By Mouth Once A Day 6)  Avodart 0.5 Mg Caps (Dutasteride) .... Take 1 Tablet By Mouth Once A  Day 7)  Dicyclomine Hcl 10 Mg Caps (Dicyclomine Hcl) .... Take 1 Tablet By Mouth Once A Day 8)  Klor-Con 10 10 Meq Cr-Tabs (Potassium Chloride) .... 2 Caps By Mouth Every Day 9)  Lumigan 0.03 % Soln (Bimatoprost) .... As Needed 10)  Trihexyphenidyl Hcl 2 Mg Tabs (Trihexyphenidyl Hcl) .... One Tablet By Mouth Every Other Day 11)  B Complex  Tabs (B Complex Vitamins) .... Take 1 Tablet By Mouth Once A Day 12)  Mevacor 40 Mg Tabs (Lovastatin) .... Take 1 Tablet By Mouth Once A Day 13)  Nexium 20 Mg Cpdr (Esomeprazole Magnesium) .... Take 1 Tablet By Mouth Once A Day 14)  Klonopin 1 Mg Tabs (Clonazepam) .... Take 1 Tablet By Mouth Three Times A Day 15)  Depakote 250 Mg Tbec (Divalproex Sodium) .... Two Tablets At Bedtime 16)  Vitamin D (Ergocalciferol) 50000 Unit Caps (Ergocalciferol) .... One Tablet By Mouth Every Week 17)  Trazodone Hcl 100 Mg Tabs (Trazodone Hcl) .... Two Tablets By Mouth Every Night At Bedtime 18)  Zyprexa 15 Mg Tabs (Olanzapine) .... Two Tablets At Bedtime 19)  Promethazine Hcl 25 Mg Tabs (Promethazine Hcl) .... Take 1 Tablet By Mouth Four Times A Day 20)  Mercaptopurine 50 Mg Tabs (Mercaptopurine) .... Take 1 Tablet By Mouth Once A Day 21)  Brimonidine Tartrate 0.15 % Soln (Brimonidine Tartrate) .... Instill One Drop Into Both Eyes Twice Daily 22)  Dorzolamide-Timolol 2-0.5 % Soln (Dorzolamide-Timolol) .... Instill One Drop Into Both Eyes  Twice Daily 23)  Cholestyramine 4 Gm Pack (Cholestyramine) .Marland Kitchen.. 1 Packet Twice Daily  Allergies: 1)  ! Bactrim 2)  ! Pcn 3)  ! Sulfa 4)  ! Codeine  Past History:  Past Medical History: Dysphagia for pills NOS-----GI at [facility] Crohn's Disease IBS Hypertension Hyperlipidemia Allergic Rhinitis "Valve near heart does not open and close properly" Paranoid Schizophrenia Glaucoma Peripheral Vascular Disease #LABS Creat 1.34 and hgb 13.2 on 08/19/2008 #Hx of pneumonia nos #Dyspnea >CT 04/01/2005 (personally reviewed) - No PE >Spiro  at [facility] 01/12/2009 Fev1 1.6L/47%, FVC 2.8L/67%, Ratio 56 (79) (personally reviewed) >CHronic Bilateral Bibasal atelectasis and scarring seen on cxr 03/03/2004, 07/06/2004, the ct 04/01/2005, and cxr 08/19/2008  Past Surgical History: Cholecystectomy Colon Surgery-1996  Family History: Mother-hypertension, heart disease Father-hyoertension, heart disease Brother- Lung Cancer in 2006  Social History: Lives with caretaker-Bernadette Small Single Quit smoking 1998, smoked 30yr 1 ppd. DisabledSmoking Status:  quit Packs/Day:  1.0  Review of Systems       The patient complains of shortness of breath with activity, chest pain, acid heartburn, indigestion, loss of appetite, weight change, abdominal pain, difficulty swallowing, anxiety, and depression.  The patient denies shortness of breath at rest, productive cough, non-productive cough, coughing up blood, irregular heartbeats, sore throat, tooth/dental problems, headaches, nasal congestion/difficulty breathing through nose, sneezing, itching, ear ache, hand/feet swelling, joint stiffness or pain, rash, change in color of mucus, and fever.         #dysphagia - occassionaly. chokes while taking pills. Pills reportedly get stuck in throat. [place]  Patient profile:   55year old male Height:      71 inches Weight:      206.50 pounds BMI:     28.91 O2 Sat:      98 % on Room air Temp:     98.0 degrees F oral Pulse rate:   83 / minute BP sitting:   128 / 80  (right arm) Cuff size:   regular  Vitals Entered By: JRandolph BingCMA (January 27, 2009 2:02 PM)  O2 Flow:  Room air CC: Pulmonary Consult for abnormal spirometry. Comments Medications reviewed with patient JRandolph BingCMA  January 27, 2009 2:08 PM    Physical Exam  General:  well developed, well nourished, in no acute distress Head:  normocephalic and atraumatic Eyes:  PERRLA/EOM intact; conjunctiva and  sclera clear Ears:  TMs intact and clear with normal canals Nose:  no deformity, discharge, inflammation, or lesions Mouth:  no deformity or lesions Neck:  no masses, thyromegaly, or abnormal cervical nodes Chest Wall:  no deformities noted Lungs:  clear bilaterally to auscultation and percussiondecreased BS bilateral.   Heart:  regular rate and rhythm, S1, S2 without murmurs, rubs, gallops, or clicks Abdomen:  abd softmidline scar.   no mass  non tenderness Msk:  no deformity or scoliosis noted with normal posture Pulses:  pulses normal Extremities:  no clubbing, cyanosis, edema, or deformity noted Neurologic:  CN II-XII grossly intact with normal reflexes, coordination, muscle strength and tone Skin:  intact without lesions or rashes Cervical Nodes:  no significant adenopathy Axillary Nodes:  no significant adenopathy Psych:  depressed affect, oriented, and poor historian.     MISC. Report  Procedure date:  01/27/2009  Findings:      labs reviewed, outside spiro and ct chest all reviewed personally and updated in past hx  Impression & Recommendations:  Problem #  1:  SHORTNESS OF BREATH (SOB) (ICD-786.05) Assessment New  I think he dyspnea is due to Gold stage 3 copd + deconditioning  plan copd rx - see below refer pulm rehab  Orders: Pulmonary Referral (Pulmonary) Consultation Level V (33825)  Problem # 2:  C O P D (ICD-496) Assessment: New Gold stage 3  plan albuterol 2 puff as needed start spiriva 1 puff daily refer pulmonary rehab' pneumovax today flu shot in fall rov in 6 weeks  Problem # 3:  DIZZINESS, CHRONIC (ICD-780.4) Assessment: New  ? cause.   plan per PMD  Orders: Consultation Level V (05397)  Medications Added to Medication List This Visit: 1)  Klor-con 10 10 Meq Cr-tabs (Potassium chloride) .... 2 caps by mouth every day 2)  Depakote 250 Mg Tbec (Divalproex sodium) .... Two tablets at bedtime 3)  Trazodone Hcl 100 Mg Tabs (Trazodone  hcl) .... Two tablets by mouth every night at bedtime 4)  Zyprexa 15 Mg Tabs (Olanzapine) .... Two tablets at bedtime 5)  Promethazine Hcl 25 Mg Tabs (Promethazine hcl) .... Take 1 tablet by mouth four times a day 6)  Mercaptopurine 50 Mg Tabs (Mercaptopurine) .... Take 1 tablet by mouth once a day 7)  Brimonidine Tartrate 0.15 % Soln (Brimonidine tartrate) .... Instill one drop into both eyes twice daily 8)  Dorzolamide-timolol 2-0.5 % Soln (Dorzolamide-timolol) .... Instill one drop into both eyes twice daily 9)  Cholestyramine 4 Gm Pack (Cholestyramine) .Marland Kitchen.. 1 packet twice daily 10)  Spiriva Handihaler 18 Mcg Caps (Tiotropium bromide monohydrate) .... One puff daily 11)  Proair Hfa 108 (90 Base) Mcg/act Aers (Albuterol sulfate) .Marland Kitchen.. 1-2 puffs every 4-6 hours as needed  Other Orders: HFA Instruction [phone]) Rehabilitation Referral (Rehab)  Patient Instructions: 1)  albuterol 2 puff as needed 2)  start spiriva 1 puff daily 3)  refer pulmonary rehab' 4)  pneumovax today 5)  flu shot in fall 6)  rov in 6 weeks 7)  full pft at return 8)  My cma will make sure your inhaler technique with spiriva and albuterol is good Prescriptions: PROAIR HFA 108 (90 BASE) MCG/ACT  AERS (ALBUTEROL SULFATE) 1-2 puffs every 4-6 hours as needed  #1 x 6   Entered and Authorized by:   Brand Males MD   Signed by:   Brand Males MD on 01/27/2009   Method used:   Print then Give to Patient   RxID:   [id number] SPIRIVA HANDIHALER 18 MCG CAPS (TIOTROPIUM BROMIDE MONOHYDRATE) one puff daily  #1 x 6   Entered and Authorized by:   Brand Males MD   Signed by:   Brand Males MD on 01/27/2009   Method used:   Print then Give to Patient   RxID:   [id number]   Appended Document: ABNORMAL SPIROMETRY///kp    Clinical Lists Changes  Orders: Added new Service order of Pneumococcal Vaccine Ped < 40yr ((89211 - Signed Added new Service order of Admin 1st Vaccine ((94174 - Signed Added  new Service order of [facility]ruction [phone] - Signed Observations: Added new observation of PNEUPED#1VIS: 05/28/07 version given January 27, 2009. (01/27/2009 15:33) Added new observation of PNEUPED#1LOT: [id number](01/27/2009 15:33) Added new observation of PNEUPED#1EXP: 07/24/2009 (01/27/2009 15:33) Added new observation of PNEUPED#1BY: JRandolph BingCMA (01/27/2009 15:33) Added new observation of PNEUPED#1RTE: IM (01/27/2009 15:33) Added new observation of PNEUPED#1DSE: 0.5 ml (01/27/2009 15:33) Added new observation of PNEUPED#1MFR: Merck (01/27/2009 15:33) Added new observation of PNEUPED#1STE: right deltoid (01/27/2009 15:33) Added new observation of PNEUPED#1:  Prevnar (01/27/2009 15:33)       Immunizations Administered:  Pediatric Pneumococcal Vaccine:    Vaccine Type: Prevnar    Site: right deltoid    Mfr: Merck    Dose: 0.5 ml    Route: IM    Given by: Drumright Bing CMA    Exp. Date: 07/24/2009    Lot #: [id number]    VIS given: 05/28/07 version given January 27, 2009.  Appended Document: ABNORMAL SPIROMETRY///kp Ambulatory Pulse Oximetry  Resting; HR__79___    02 Sat___98__  Lap1 (185 feet)   HR_83____   02 Sat_97____ Lap2 (185 feet)   HR_____   02 Sat_____    Lap3 (185 feet)   HR_____   02 Sat_____  ___Test Completed without Difficulty __x_Test Stopped due to:pt request

## 2010-07-19 NOTE — Assessment & Plan Note (Signed)
Summary: go over pft follow up/klw   Visit Type:  Follow-up Copy to:  Dr. Gertie Exon Primary Provider/Referring Provider:  Dr. Benson Setting  CC:  Pt here to discuss PFT results.  Pt states breathing has been doing "pretty good".  History of Present Illness: IOV 01/27/2009. 54 year old paranoid schizophernic, 27 pack ex-smoker. REports dyspnea for a "long time". HE and caretaker think dyspnea started 1-2 years ago. Cardiac etiology ruled out by Dr. Irish Lack per care taker. Progressive in nature. Brought on by bending, walking. Effort tolerance is around 100 feet or 1 flight of stairs. Denies dyspnea for taking shower (given bath by caretaker) but will get dyspneic while bending during clothing change. Dyspnea relieved by albuterol and rest. He has associated dizziness with dyspnea. He also gets dizzy when standing up from sitting or supine position; again associated with dyspnea.  He also has rt sided, atypical chest pain for 1-2 years but reportedly cleared by cards.   He saw Dr. Tye Savoy his PMD for above end june 2010. Spirometry showed obstrucion. Was started on albuterol inhaler  and sent here. In office today, he walked <90 feet and was dyspneic but he did not desatrate or become tachycardic.   REC: Spiiriva and rehab  OV 05/10/2009:  Followup Dyspnea and GOld stage 2 COPD. LAst visit spiriva and rehab was recommended. He is compliant with both. AT rehab he is not desaturating. He does not like spiriva due to taste but is tolerating it fine otherwise. Dyspnea is now significantly improved. He is able to walk >1 block and long distances. Denies chest pain, cough, edema, orthopnea, pnd, hemoptyss  Current Medications (verified): 1)  Proair Hfa 108 (90 Base) Mcg/act Aers (Albuterol Sulfate) .... 2 Puffs Four Times A Day 2)  Trilipix 135 Mg Cpdr (Choline Fenofibrate) .... One By Mouth Every Other Day 3)  Perphenazine 4 Mg Tabs (Perphenazine) .... Take 1 Tablet By Mouth Once A Day 4)   Uroxatral 10 Mg Xr24h-Tab (Alfuzosin Hcl) .... Take 1 Tablet By Mouth Once A Day 5)  Pentasa 250 Mg Cr-Caps (Mesalamine) .... Take 1 Tablet By Mouth Once A Day 6)  Avodart 0.5 Mg Caps (Dutasteride) .... Take 1 Tablet By Mouth Once A Day 7)  Dicyclomine Hcl 10 Mg Caps (Dicyclomine Hcl) .... Take 1 Tablet By Mouth Once A Day 8)  Klor-Con 10 10 Meq Cr-Tabs (Potassium Chloride) .... 2 Caps By Mouth Every Day 9)  Lumigan 0.03 % Soln (Bimatoprost) .... As Needed 10)  Trihexyphenidyl Hcl 2 Mg Tabs (Trihexyphenidyl Hcl) .... One Tablet By Mouth Every Other Day 11)  B Complex  Tabs (B Complex Vitamins) .... Take 1 Tablet By Mouth Once A Day 12)  Mevacor 40 Mg Tabs (Lovastatin) .... Take 1 Tablet By Mouth Once A Day 13)  Nexium 20 Mg Cpdr (Esomeprazole Magnesium) .... Take 1 Tablet By Mouth Once A Day 14)  Klonopin 1 Mg Tabs (Clonazepam) .... Take 1 Tablet By Mouth One Times A Day 15)  Depakote 250 Mg Tbec (Divalproex Sodium) .... Two Tablets At Bedtime 16)  Vitamin D (Ergocalciferol) 50000 Unit Caps (Ergocalciferol) .... One Tablet By Mouth Every Week 17)  Trazodone Hcl 100 Mg Tabs (Trazodone Hcl) .... Two Tablets By Mouth Every Night At Bedtime 18)  Zyprexa 15 Mg Tabs (Olanzapine) .... Two Tablets At Bedtime 19)  Promethazine Hcl 25 Mg Tabs (Promethazine Hcl) .... Take 1 Tablet By Mouth Four Times A Day 20)  Mercaptopurine 50 Mg Tabs (Mercaptopurine) .... Take 1  Tablet By Mouth Once A Day 21)  Brimonidine Tartrate 0.15 % Soln (Brimonidine Tartrate) .... Instill One Drop Into Both Eyes Twice Daily 22)  Dorzolamide-Timolol 2-0.5 % Soln (Dorzolamide-Timolol) .... Instill One Drop Into Both Eyes Twice Daily 23)  Cholestyramine 4 Gm Pack (Cholestyramine) .Marland Kitchen.. 1 Packet Twice Daily  Allergies (verified): 1)  ! Bactrim 2)  ! Pcn 3)  ! Sulfa 4)  ! Codeine  Past History:  Past Medical History: Last updated: 01/27/2009 Dysphagia for pills NOS-----GI at [facility] Crohn's  Disease IBS Hypertension Hyperlipidemia Allergic Rhinitis "Valve near heart does not open and close properly" Paranoid Schizophrenia Glaucoma Peripheral Vascular Disease #LABS Creat 1.34 and hgb 13.2 on 08/19/2008 #Hx of pneumonia nos #Dyspnea >CT 04/01/2005 (personally reviewed) - No PE >S[facility] at [facility] 01/12/2009 Fev1 1.6L/47%, FVC 2.8L/67%, Ratio 56 (79) (personally reviewed) >CHronic Bilateral Bibasal atelectasis and scarring seen on cxr 03/03/2004, 07/06/2004, the ct 04/01/2005, and cxr 08/19/2008  Past Surgical History: Last updated: 01/27/2009 Cholecystectomy Colon Surgery-1996  Family History: Last updated: 01/27/2009 Mother-hypertension, heart disease Father-hyoertension, heart disease Brother- Lung Cancer in 2006  Social History: Last updated: 01/27/2009 Lives with caretaker-Bernadette Small Single Quit smoking 1998, smoked 7yr 1 ppd. Disabled  Risk Factors: Smoking Status: quit (01/27/2009) Packs/Day: 1.0 (01/27/2009)  Family History: Reviewed history from 01/27/2009 and no changes required. Mother-hypertension, heart disease Father-hyoertension, heart disease Brother- Lung Cancer in 2006  Social History: Reviewed history from 01/27/2009 and no changes required. Lives with caretaker-Bernadette Small Single Quit smoking 1998, smoked 261yr1 ppd. Disabled  Review of Systems  The patient denies shortness of breath with activity, shortness of breath at rest, productive cough, non-productive cough, coughing up blood, chest pain, irregular heartbeats, acid heartburn, indigestion, loss of appetite, weight change, abdominal pain, difficulty swallowing, sore throat, tooth/dental problems, headaches, nasal congestion/difficulty breathing through nose, sneezing, itching, ear ache, anxiety, depression, hand/feet swelling, joint stiffness or pain, rash, change in color of mucus, and fever.    Vital Signs:  Patient profile:   [age]ear old  male Height:      71 inches Weight:      206 pounds O2 Sat:      96 % on Room air Temp:     98.2 degrees F oral Pulse rate:   90 / minute BP sitting:   100 / 70  (left arm) Cuff size:   regular  Vitals Entered By: JeRandolph BingMA (May 10, 2009 9:19 AM)  O2 Flow:  Room air CC: Pt here to discuss PFT results.  Pt states breathing has been doing "pretty good" Comments Medications reviewed with patient JeRandolph BingMA  May 10, 2009 9:20 AM    Physical Exam  General:  well developed, well nourished, in no acute distress Head:  normocephalic and atraumatic Eyes:  PERRLA/EOM intact; conjunctiva and sclera clear Ears:  TMs intact and clear with normal canals Nose:  no deformity, discharge, inflammation, or lesions Mouth:  no deformity or lesions Neck:  no masses, thyromegaly, or abnormal cervical nodes Chest Wall:  no deformities noted Lungs:  clear bilaterally to auscultation and percussiondecreased BS bilateral.   Heart:  regular rate and rhythm, S1, S2 without murmurs, rubs, gallops, or clicks Abdomen:  abd softmidline scar.   no mass  non tenderness Msk:  no deformity or scoliosis noted with normal posture Pulses:  pulses normal Extremities:  no clubbing, cyanosis, edema, or deformity noted Neurologic:  CN II-XII grossly intact with normal reflexes, coordination, muscle strength and tone Skin:  intact without lesions or rashes Cervical Nodes:  no significant adenopathy Axillary Nodes:  no significant adenopathy Psych:  depressed affect, oriented, and poor historian.     Impression & Recommendations:  Problem # 1:  C O P D (ICD-496) Assessment Improved stable. Clinically improved with rehab and spiriva  plan continue spiriva continue rehab have asked caretaker to call PMD and check on flu shot status - if not given, then have one today mdi technique re-taught rov 6 months  Medications Added to Medication List This Visit: 1)  Klonopin 1 Mg Tabs  (Clonazepam) .... Take 1 tablet by mouth one times a day 2)  Spiriva Handihaler 18 Mcg Caps (Tiotropium bromide monohydrate) .... One puffs in handihaler daily  Other Orders: Est. Patient Level II (15400) HFA Instruction [phone])  Patient Instructions: 1)  continue spiriva  1 puff daily 2)  continue rehab 3)  have you had flu shot? 4)  return in 6 months Prescriptions: SPIRIVA HANDIHALER 18 MCG  CAPS (TIOTROPIUM BROMIDE MONOHYDRATE) one puffs in handihaler daily  #1 x 6   Entered and Authorized by:   Brand Males MD   Signed by:   Brand Males MD on 05/10/2009   Method used:   Electronically to        [id number] (retail)       [place], [place]  [address]       Ph: [phone]       Fax: [phone]   RxID:   [id number] PROAIR HFA 108 (90 BASE) MCG/ACT AERS (ALBUTEROL SULFATE) 2 puffs four times a day  #1 x 6   Entered and Authorized by:   Brand Males MD   Signed by:   Brand Males MD on 05/10/2009   Method used:   Electronically to        [id number] (retail)       [place], [place]  [address]       Ph: [phone]       Fax: [phone]   RxID:   [id number]   Appended Document: go over pft follow up/klw    Clinical Lists Changes  Orders: Added new Service order of Admin 1st Vaccine [id number] - Signed Added new Service order of Flu Vaccine 40yr + [phone] - Signed Observations: Added new observation of FLU VAX VIS: 01/26/09 version (05/10/2009 10:17) Added new observation of FLU VAXLOT: [id number] (05/10/2009 10:17) Added new observation of FLU VAXMFR: Glaxosmithkline (05/10/2009 10:17) Added new observation of FLU VAX EXP: 12/16/2009 (05/10/2009 10:17) Added new observation of FLU VAX DSE: 0.54m(05/10/2009 10:17) Added new observation of FLU VAX: Fluvax 3+ (05/10/2009 10:17)Flu Vaccine Consent Questions     Do you have a history of severe allergic reactions to this  vaccine? no    Any prior history of allergic reactions to egg and/or gelatin? no    Do you have a sensitivity to the preservative Thimersol? no    Do you have a past history of Guillan-Barre Syndrome? no    Do you currently have an acute febrile illness? no    Have you ever had a severe reaction to latex? no    Vaccine information given and explained to patient? yes    Are you currently pregnant? no    Lot Number:[id number]   Exp Date:12/16/2009   Site Given right Deltoid IMw observation of FLU VAXLOT: [id number] (05/10/2009 10:17)  Added new observation of FLU VAXMFR: Glaxosmithkline (05/10/2009 10:17) Added new observation of FLU VAX EXP: 12/16/2009 (05/10/2009 10:17) Added new observation of FLU VAX DSE: 0.75m (05/10/2009 10:17) Added new observation of FLU VAX: Fluvax 3+ (05/10/2009 10:17)   .lbflu

## 2010-07-19 NOTE — Assessment & Plan Note (Signed)
Summary: rov/ mbw   Visit Type:  Follow-up Copy to:  Dr. Gertie Exon Primary Provider/Referring Provider:  Dr. Benson Setting  CC:  Pt here for 6 month follow-up. Pt has no cpmplaints. he is attending rehab. Marland Kitchen  History of Present Illness: OV 09/27/2009:  Followup Dyspnea and GOld stage 2 COPD. In august 2010 we started spiriva and rehab. He is now stating that rehab has worked real well for him. Dyspnea is significantly improved. Hardly any cough. Attribtes all improvement to rehab and spiriva. Unable to quantify degree of improvement but he states he is happy with his current quality of life.  Denies chest pain, cough, edema, orthopnea, pnd, hemoptysis, edema, syncope.   Current Medications (verified): 1)  Proair Hfa 108 (90 Base) Mcg/act Aers (Albuterol Sulfate) .... 2 Puffs Four Times A Day 2)  Trilipix 135 Mg Cpdr (Choline Fenofibrate) .... One By Mouth Every Other Day 3)  Perphenazine 4 Mg Tabs (Perphenazine) .... Take 1 Tablet By Mouth Once A Day 4)  Uroxatral 10 Mg Xr24h-Tab (Alfuzosin Hcl) .... Take 1 Tablet By Mouth Once A Day 5)  Pentasa 250 Mg Cr-Caps (Mesalamine) .... Take 1 Tablet By Mouth Once A Day 6)  Avodart 0.5 Mg Caps (Dutasteride) .... Take 1 Tablet By Mouth Once A Day 7)  Dicyclomine Hcl 10 Mg Caps (Dicyclomine Hcl) .... Take 1 Tablet By Mouth Once A Day 8)  Klor-Con 10 10 Meq Cr-Tabs (Potassium Chloride) .... 2 Caps By Mouth Every Day 9)  Lumigan 0.03 % Soln (Bimatoprost) .... As Needed 10)  Trihexyphenidyl Hcl 2 Mg Tabs (Trihexyphenidyl Hcl) .... One Tablet By Mouth Every Other Day 11)  B Complex  Tabs (B Complex Vitamins) .... Take 1 Tablet By Mouth Once A Day 12)  Mevacor 40 Mg Tabs (Lovastatin) .... Take 1 Tablet By Mouth Once A Day 13)  Nexium 20 Mg Cpdr (Esomeprazole Magnesium) .... Take 1 Tablet By Mouth Once A Day 14)  Klonopin 1 Mg Tabs (Clonazepam) .... Take 1 Tablet By Mouth One Times A Day 15)  Vitamin D (Ergocalciferol) 50000 Unit Caps (Ergocalciferol)  .... One Tablet By Mouth Every Week 16)  Trazodone Hcl 100 Mg Tabs (Trazodone Hcl) .... Two Tablets By Mouth Every Night At Bedtime 17)  Zyprexa 15 Mg Tabs (Olanzapine) .... Two Tablets At Bedtime 18)  Mercaptopurine 50 Mg Tabs (Mercaptopurine) .... Take 1 Tablet By Mouth Once A Day 19)  Brimonidine Tartrate 0.15 % Soln (Brimonidine Tartrate) .... Instill One Drop Into Both Eyes Twice Daily 20)  Dorzolamide-Timolol 2-0.5 % Soln (Dorzolamide-Timolol) .... Instill One Drop Into Both Eyes Twice Daily 21)  Cholestyramine 4 Gm Pack (Cholestyramine) .Marland Kitchen.. 1 Packet Twice Daily 22)  Spiriva Handihaler 18 Mcg Caps (Tiotropium Bromide Monohydrate) .... Once Daily  Allergies (verified): 1)  ! Bactrim 2)  ! Pcn 3)  ! Sulfa 4)  ! Codeine  Past History:  Family History: Last updated: 01/27/2009 Mother-hypertension, heart disease Father-hyoertension, heart disease Brother- Lung Cancer in 2006  Social History: Last updated: 01/27/2009 Lives with caretaker-Bernadette Small Single Quit smoking 1998, smoked 75yr 1 ppd. Disabled  Risk Factors: Smoking Status: quit (01/27/2009) Packs/Day: 1.0 (01/27/2009)  Past Medical History: Reviewed history from 01/27/2009 and no changes required. Dysphagia for pills NOS[facility] Hypertension Hyperlipidemia Allergic Rhinitis "Valve near heart does not open and close properly" Paranoid Schizophrenia Glaucoma Peripheral Vascular Disease #LABS Creat 1.34 and hgb 13.2 on 08/19/2008 #Hx of pneumonia nos #Dyspnea >CT 04/01/2005 (personally reviewed) - No PE >  Arlyce Harman at [facility] 01/12/2009 Fev1 1.6L/47%, FVC 2.8L/67%, Ratio 56 (79) (personally reviewed) >CHronic Bilateral Bibasal atelectasis and scarring seen on cxr 03/03/2004, 07/06/2004, the ct 04/01/2005, and cxr 08/19/2008  Past Surgical History: Reviewed history from 01/27/2009 and no changes required. Cholecystectomy Colon Surgery-1996  Family  History: Reviewed history from 01/27/2009 and no changes required. Mother-hypertension, heart disease Father-hyoertension, heart disease Brother- Lung Cancer in 2006  Social History: Reviewed history from 01/27/2009 and no changes required. Lives with caretaker-Bernadette Small Single Quit smoking 1998, smoked 53yr 1 ppd. Disabled  Review of Systems  The patient denies shortness of breath with activity, shortness of breath at rest, productive cough, non-productive cough, coughing up blood, chest pain, irregular heartbeats, acid heartburn, indigestion, loss of appetite, weight change, abdominal pain, difficulty swallowing, sore throat, tooth/dental problems, headaches, nasal congestion/difficulty breathing through nose, sneezing, itching, ear ache, anxiety, depression, hand/feet swelling, joint stiffness or pain, rash, change in color of mucus, and fever.    Vital Signs:  Patient profile:   55year old male Height:      71 inches Weight:      225.25 pounds O2 Sat:      96 % on Room air Temp:     97.8 degrees F oral Pulse rate:   84 / minute BP sitting:   108 / 80  (right arm) Cuff size:   regular  Vitals Entered By: JRandolph BingCMA (September 27, 2009 9:20 AM)  O2 Flow:  Room air CC: Pt here for 6 month follow-up. Pt has no cpmplaints. he is attending rehab.  Comments Medications reviewed with patient JRandolph BingCMA  September 27, 2009 9:21 AM Daytime phone number verified with patient.    Physical Exam  General:  well developed, well nourished, in no acute distress Head:  normocephalic and atraumatic Eyes:  PERRLA/EOM intact; conjunctiva and sclera clear Ears:  TMs intact and clear with normal canals Nose:  no deformity, discharge, inflammation, or lesions Mouth:  no deformity or lesions Neck:  no masses, thyromegaly, or abnormal cervical nodes Chest Wall:  no deformities noted Lungs:  clear bilaterally to auscultation and percussiondecreased BS bilateral.    Heart:  regular rate and rhythm, S1, S2 without murmurs, rubs, gallops, or clicks Abdomen:  abd softmidline scar.   no mass  non tenderness Msk:  no deformity or scoliosis noted with normal posture Pulses:  pulses normal Extremities:  no clubbing, cyanosis, edema, or deformity noted Neurologic:  CN II-XII grossly intact with normal reflexes, coordination, muscle strength and tone Skin:  intact without lesions or rashes Cervical Nodes:  no significant adenopathy Axillary Nodes:  no significant adenopathy Psych:  depressed affect, oriented, and poor historian.     Impression & Recommendations:  Problem # 1:  C O P D (ICD-496) Assessment Improved Stable disease and improved  plan continue rehab and spiriva inhaler technique recognized and appreciated as good return in 9 months educated on flare recognition  Medications Added to Medication List This Visit: 1)  Spiriva Handihaler 18 Mcg Caps (Tiotropium bromide monohydrate) .... Once daily  Other Orders: Est. Patient Level II ((32671 HFA Instruction ([phone]  Patient Instructions: 1)  glad you are doing well 2)  continue with spiriva one puff daily 3)  demonstrate technique to my nurse again today 4)  conitnue wit rehab 5)  return to see me in 9 months 6)  call sooner or come sooner if there are problems   Immunization History:  Pneumovax Immunization History:  Pneumovax:  pneumovax (06/19/2008)

## 2010-07-19 NOTE — Letter (Signed)
Summary: OV/[facility]  OV/[facility]   Imported By: Phillis Knack 01/29/2009 11:39:07  _____________________________________________________________________  External Attachment:    Type:   Image     Comment:   External Document

## 2010-07-21 NOTE — Assessment & Plan Note (Signed)
Summary: f/u ///kp   Visit Type:  Follow-up Copy to:  Dr. Gertie Exon Primary Provider/Referring Provider:  Dr. Benson Setting  CC:  Pt here for follow-up. Pt has completed  pulmonary rehab. He staes breathign is doing well. Chad Moss  History of Present Illness: July 13, 2010: 9 month followup  stage 2 COPD. Last seen April 2011. In interim, has completed pulmonary rehab. Has had flu shot. States compliance with spiriva. No new complaints. Feels well. Hardly any dyspnea now.  Hardly any cough. Attribtes all improvement to rehab and spiriva. Unable to quantify degree of improvement but he states he is happy with his current quality of life.  Denies chest pain, cough, edema, orthopnea, pnd, hemoptysis, edema, syncope.   Preventive Screening-Counseling & Management  Alcohol-Tobacco     Smoking Status: quit     Packs/Day: 1.0     Year Started: 1971     Year Quit: 1998  Current Medications (verified): 1)  Proair Hfa 108 (90 Base) Mcg/act Aers (Albuterol Sulfate) .... 2 Puffs Four Times A Day 2)  Trilipix 135 Mg Cpdr (Choline Fenofibrate) .... One By Mouth Every Other Day 3)  Perphenazine 4 Mg Tabs (Perphenazine) .... Take 1 Tablet By Mouth Once A Day 4)  Uroxatral 10 Mg Xr24h-Tab (Alfuzosin Hcl) .... Take 1 Tablet By Mouth Once A Day 5)  Pentasa 250 Mg Cr-Caps (Mesalamine) .... Take 1 Tablet By Mouth Once A Day 6)  Avodart 0.5 Mg Caps (Dutasteride) .... Take 1 Tablet By Mouth Once A Day 7)  Dicyclomine Hcl 10 Mg Caps (Dicyclomine Hcl) .... Take 1 Tablet By Mouth Once A Day 8)  Klor-Con 10 10 Meq Cr-Tabs (Potassium Chloride) .... 2 Caps By Mouth Every Day 9)  Lumigan 0.03 % Soln (Bimatoprost) .... As Needed 10)  Trihexyphenidyl Hcl 2 Mg Tabs (Trihexyphenidyl Hcl) .... One Tablet By Mouth Every Other Day 11)  B Complex  Tabs (B Complex Vitamins) .... Take 1 Tablet By Mouth Once A Day 12)  Mevacor 40 Mg Tabs (Lovastatin) .... Take 1 Tablet By Mouth Once A Day 13)  Nexium 20 Mg Cpdr  (Esomeprazole Magnesium) .... Take 1 Tablet By Mouth Once A Day 14)  Klonopin 1 Mg Tabs (Clonazepam) .... Take 1 Tablet By Mouth One Times A Day 15)  Vitamin D (Ergocalciferol) 50000 Unit Caps (Ergocalciferol) .... One Tablet By Mouth Every Week 16)  Trazodone Hcl 100 Mg Tabs (Trazodone Hcl) .... Two Tablets By Mouth Every Night At Bedtime 17)  Zyprexa 15 Mg Tabs (Olanzapine) .... Two Tablets At Bedtime 18)  Mercaptopurine 50 Mg Tabs (Mercaptopurine) .... Take 1 Tablet By Mouth Once A Day 19)  Brimonidine Tartrate 0.15 % Soln (Brimonidine Tartrate) .... Instill One Drop Into Both Eyes Twice Daily 20)  Dorzolamide-Timolol 2-0.5 % Soln (Dorzolamide-Timolol) .... Instill One Drop Into Both Eyes Twice Daily 21)  Cholestyramine 4 Gm Pack (Cholestyramine) .Chad Moss.. 1 Packet Twice Daily 22)  Spiriva Handihaler 18 Mcg Caps (Tiotropium Bromide Monohydrate) .... Once Daily  Allergies (verified): 1)  ! Bactrim 2)  ! Pcn 3)  ! Sulfa 4)  ! Codeine  Past History:  Past medical, surgical, family and social histories (including risk factors) reviewed, and no changes noted (except as noted below).  Past Medical History: Reviewed history from 01/27/2009 and no changes required. Dysphagia for pills NOS-----GI at [facility] Crohn's Disease IBS Hypertension Hyperlipidemia Allergic Rhinitis "Valve near heart does not open and close properly" Paranoid Schizophrenia Glaucoma Peripheral Vascular Disease #LABS Creat 1.34 and hgb 13.2  on 08/19/2008 #Hx of pneumonia nos #Dyspnea >CT 04/01/2005 (personally reviewed) - No PE >[facility] 01/12/2009 Fev1 1.6L/47%, FVC 2.8L/67%, Ratio 56 (79) (personally reviewed) >CHronic Bilateral Bibasal atelectasis and scarring seen on cxr 03/03/2004, 07/06/2004, the ct 04/01/2005, and cxr 08/19/2008  Past Surgical History: Reviewed history from 01/27/2009 and no changes required. Cholecystectomy Colon Surgery-1996  Family History: Reviewed history from  01/27/2009 and no changes required. Mother-hypertension, heart disease Father-hyoertension, heart disease Brother- Lung Cancer in 2006  Social History: Reviewed history from 01/27/2009 and no changes required. Lives with caretaker-Bernadette Small Single Quit smoking 1998, smoked 25yr 1 ppd. Disabled  Review of Systems  The patient denies shortness of breath with activity, shortness of breath at rest, productive cough, non-productive cough, coughing up blood, chest pain, irregular heartbeats, acid heartburn, indigestion, loss of appetite, weight change, abdominal pain, difficulty swallowing, sore throat, tooth/dental problems, headaches, nasal congestion/difficulty breathing through nose, sneezing, itching, ear ache, anxiety, depression, hand/feet swelling, joint stiffness or pain, rash, change in color of mucus, and fever.    Vital Signs:  Patient profile:   55year old male Height:      71 inches Weight:      225.38 pounds BMI:     31.55 O2 Sat:      95 % on Room air Temp:     97.8 degrees F oral Pulse rate:   76 / minute BP sitting:   110 / 72  (right arm) Cuff size:   regular  Vitals Entered By: JRandolph BingCMA (July 13, 2010 9:45 AM)  O2 Flow:  Room air CC: Pt here for follow-up. Pt has completed  pulmonary rehab. He staes breathign is doing well.  Comments Medications reviewed with patient JRandolph BingCMA  July 13, 2010 9:45 AM Daytime phone number verified with patient.    Physical Exam  General:  well developed, well nourished, in no acute distressobese.   Head:  normocephalic and atraumatic Eyes:  PERRLA/EOM intact; conjunctiva and sclera clear Ears:  TMs intact and clear with normal canals Nose:  no deformity, discharge, inflammation, or lesions Mouth:  no deformity or lesions Neck:  no masses, thyromegaly, or abnormal cervical nodes Chest Wall:  no deformities noted Lungs:  clear bilaterally to auscultation and percussiondecreased BS  bilateral.   Heart:  regular rate and rhythm, S1, S2 without murmurs, rubs, gallops, or clicks Abdomen:  abd softmidline scar.   no mass  non tenderness obese Msk:  no deformity or scoliosis noted with normal posture Pulses:  pulses normal Extremities:  no clubbing, cyanosis, edema, or deformity noted Neurologic:  CN II-XII grossly intact with normal reflexes, coordination, muscle strength and tone Skin:  intact without lesions or rashes Cervical Nodes:  no significant adenopathy Axillary Nodes:  no significant adenopathy Psych:  depressed affect, oriented, and poor historian.     Impression & Recommendations:  Problem # 1:  C O P D (ICD-496) Assessment Unchanged stable disese. Doing well.  PLAN continue spiriva do home exercises check alpha 1 rov 9 months  Other Orders: Est. Patient Level III ((38466  Patient Instructions: 1)  continue spiriva 2)  congratulations on finishing exercise program 3)   - do daily walks and do the exercies they taught you 4)  we will check your blood for alpha 1 a genetic cause of copd 5)  return in 9 months or sooner if you are sick 6)  i am glad you are doing as well as you can

## 2010-08-01 ENCOUNTER — Telehealth: Payer: Self-pay | Admitting: Internal Medicine

## 2010-08-10 NOTE — Progress Notes (Signed)
Summary: alpha 1 is MM  Phone Note Outgoing Call   Summary of Call: alpha 1 is MM and normal Initial call taken by: Brand Males MD,  August 01, 2010 2:40 AM  Follow-up for Phone Call        called and spoke with pts caregiver and she will let him know that his blood test was normal [place]  August 02, 2010 5:13 PM

## 2010-09-02 ENCOUNTER — Telehealth: Payer: Self-pay | Admitting: Internal Medicine

## 2010-09-06 ENCOUNTER — Telehealth: Payer: Self-pay | Admitting: *Deleted

## 2010-09-06 NOTE — Telephone Encounter (Signed)
Phone Note  Call from Patient   Caller: pateints care taker Call For: ramaswamy Summary of Call: ptatients care giver Bernadettphoned stated that Dr Marchelle Gearing had put the patient in a study and their appt was 3/9  and they misssed this appt.  They want to reschedule. Please call her at [phone] Initial call taken by: Vedia Coffer,  September 02, 2010 11:35 AM  Follow-up for Phone Call         Will send to [facility] so they can reschedule her Carver Fila  September 02, 2010 11:57 AM   PCC's do not schedule these studies.  Sounds like it is a reasearch study. The physician or his nurse enrolls the patient. You will need to check with Victorino Dike C about this issue. Rhonda Cobb  September 02, 2010 3:14 PM  LMTCBx1 to get mre information about what appt was missed. Carron Curie CMA  September 02, 2010 4:01 PM   Additional Follow-up for Phone Call  Additional follow up Details #1::        Called, spoke with [facility].  States pt had a research appt here on 08/26/10 but was unable to keep this appt.  States they scheduled the appt with Arshena.  Does not know the name of the research study. Requesting to have this rescheduled.  Will send message to Victorino Dike to see if she knows how to get this rescheduled.   Additional Follow-up by: Gweneth Dimitri RN,  September 05, 2010 6:01 PM  I emailed Chase Caller to have her reschedule the pt for the [facility] Study. Carron Curie, MA

## 2010-09-15 NOTE — Progress Notes (Signed)
Summary: stated pt was scheduled for study they missed appt  Phone Note Call from Patient   Caller: pateints care taker Call For: Loukisha Gunnerson Summary of Call: ptatients care giver Bernadettphoned stated that Dr Chase Caller had put the patient in a study and their appt was 3/9  and they misssed this appt.  They want to reschedule. Please call her at [phone] Initial call taken by: Ozella Rocks,  September 02, 2010 11:35 AM  Follow-up for Phone Call        Will send to [facility] so they can reschedule her Charma Igo  September 02, 2010 11:57 AM   PCC's do not schedule these studies.  Sounds like it is a reasearch study. The physician or his nurse enrolls the patient. You will need to check with Anderson Malta C about this issue. Rhonda Cobb  September 02, 2010 3:14 PM  LMTCBx1 to get mre information about what appt was missed. [place] Bing CMA  September 02, 2010 4:01 PM   Additional Follow-up for Phone Call Additional follow up Details #1::        Called, spoke with [facility].  States pt had a research appt here on 08/26/10 but was unable to keep this appt.  States they scheduled the appt with Arshena.  Does not know the name of the research study. Requesting to have this rescheduled.  Will send message to Anderson Malta to see if she knows how to get this rescheduled.   Additional Follow-up by: Raymondo Band RN,  September 05, 2010 6:01 PM    Additional Follow-up for Phone Call Additional follow up Details #2::    I emailed [facility] research nurse to have her reschedule the pt fro appt for [facility] Study. [place] Bing CMA  September 06, 2010 6:07 PM

## 2010-09-29 LAB — BASIC METABOLIC PANEL
BUN: 7 mg/dL (ref 6–23)
CO2: 25 mEq/L (ref 19–32)
Calcium: 9.2 mg/dL (ref 8.4–10.5)
Chloride: 109 mEq/L (ref 96–112)
Creatinine, Ser: 1.34 mg/dL (ref 0.4–1.5)
GFR calc Af Amer: >60 mL/min (ref >60)
GFR calc non Af Amer: 56 mL/min — ABNORMAL LOW (ref >60)
Glucose, Bld: 82 mg/dL (ref 70–99)
Potassium: 4 mEq/L (ref 3.5–5.1)
Sodium: 140 mEq/L (ref 135–145)

## 2010-09-29 LAB — CBC
HCT: 38.4 % — ABNORMAL LOW (ref 39.0–52.0)
Hemoglobin: 13.2 g/dL (ref 13.0–17.0)
MCHC: 34.4 g/dL (ref 30.0–36.0)
MCV: 95.1 fL (ref 78.0–100.0)
Platelets: 181 10*3/uL (ref 150–400)
RBC: 4.04 MIL/uL — ABNORMAL LOW (ref 4.22–5.81)
RDW: 14.5 % (ref 11.5–15.5)
WBC: 5.7 10*3/uL (ref 4.0–10.5)

## 2010-09-29 LAB — D-DIMER, QUANTITATIVE: D-Dimer, Quant: <0.22 ug/mL-FEU (ref 0.00–0.48)

## 2010-11-04 NOTE — Discharge Summary (Signed)
NAME:  [name], [name]                           ACCOUNT NO.:  [id number]   MEDICAL RECORD NO.:  [id number]                   PATIENT TYPE:  INP   LOCATION:  [place]                                 FACILITY:  [facility]   PHYSICIAN:  Corinna L. Conley Canal, MD             DATE OF BIRTH:  12-Dec-1955   DATE OF ADMISSION:  02/10/2003  DATE OF DISCHARGE:  02/12/2003                                 DISCHARGE SUMMARY   DIAGNOSES:  1. Symptomatic anemia from gastrointestinal bleed, most likely lower.  2. Gastric erosions and esophageal stricture.  3. Gastroesophageal reflux disease.  4. Crohn's disease.  5. Paranoid schizophrenia.  6. Glaucoma.  7. Peripheral vascular disease.   DISCHARGE MEDICATIONS:  1. He is to stop his Plavix.  2. He may continue his outpatient medications which include Asacol 500 mg     p.o. q.i.d.  3. Trazodone 400 mg p.o. q.h.s.  4. Clozaril 500 mg p.o. five times a day.  5. Risperdal.  6. Amantadine 100 mg p.o. b.i.d.  7. Amitriptyline 50 mg p.o. q.h.s.  8. Iron sulfate.  9. Alphagan.  10.      Cosopt.  11.      Clonazepam.  12.      His Nexium has been changed to b.i.d. and I have written a     prescription for Protonix 40 mg p.o. b.i.d. when he is done with his     Nexium.   DIET:  Regular.   ACTIVITY:  Ad lib.   FOLLOWUP:  Follow up with his gastroenterologist at [facility].  I have also  encouraged him to make an appointment with a primary care physician of his  choice here in town as he has no primary care physician.   LABORATORIES:  UA negative.  Complete metabolic panel was significant for an  albumin of 3.3, otherwise essentially normal.  His hemoglobin from an  outside laboratory was reportedly 6 but when drawn here on admission his  hemoglobin was 8.1 and his hematocrit was 25.4.  MCV was 93.2, normal  platelet count.  Iron was 11, TIBC 281, ferritin 23.  At the time of  discharge his hemoglobin is 9.4 after transfusion of 2 units of packed red  blood  cells.   CONSULTATIONS:  [facility].   PROCEDURE:  EGD which showed esophageal stricture and gastric erosions.   HISTORY AND HOSPITAL COURSE:  [name] is a 55 year old black male with a  history of Crohn's and peptic ulcer disease who has a gastroenterologist in  [place].  He has been having melena recently and had an H&H drawn as an  outpatient.  His hemoglobin was reportedly around 6 and he was told to come  to the nearest emergency room.  He had been complaining of some weakness,  but had no syncope, chest pains, or shortness of breath.  He had heme-  positive black stool on rectal examination  and his hemoglobin was 8.1 on  admission.  He was admitted and had no further melena.  GI was consulted and  agreed that a repeat EGD would be helpful.  Results as above.  The thinking  was that the amount of bleeding could not be explained by the EGD results  and a lot of his blood loss was probably from Crohn's.  His proton pump  inhibitor has been increased to b.i.d. and he is being discharged in stable  condition.  He is tolerating a regular diet at this time.                                               Corinna L. Conley Canal, MD    CLS/MEDQ  D:  02/12/2003  T:  02/12/2003  Job:  [id number]

## 2011-01-18 HISTORY — PX: PENILE PROSTHESIS IMPLANT: SHX240

## 2011-01-31 ENCOUNTER — Ambulatory Visit (HOSPITAL_BASED_OUTPATIENT_CLINIC_OR_DEPARTMENT_OTHER)
Admission: RE | Admit: 2011-01-31 | Discharge: 2011-01-31 | Disposition: A | Discharge status: Home / Self Care | Payer: Medicare Other | Source: Ambulatory Visit | Attending: Urology | Admitting: Urology

## 2011-01-31 DIAGNOSIS — N529 Male erectile dysfunction, unspecified: Secondary | ICD-10-CM | POA: Insufficient documentation

## 2011-01-31 DIAGNOSIS — Z01812 Encounter for preprocedural laboratory examination: Secondary | ICD-10-CM | POA: Insufficient documentation

## 2011-01-31 DIAGNOSIS — Z01810 Encounter for preprocedural cardiovascular examination: Secondary | ICD-10-CM | POA: Insufficient documentation

## 2011-01-31 LAB — POCT I-STAT 4, (NA,K, GLUC, HGB,HCT)
Glucose, Bld: 98 mg/dL (ref 70–99)
HCT: 38 % — ABNORMAL LOW (ref 39.0–52.0)
Hemoglobin: 12.9 g/dL — ABNORMAL LOW (ref 13.0–17.0)
Potassium: 3.8 mEq/L (ref 3.5–5.1)
Sodium: 139 mEq/L (ref 135–145)

## 2011-02-03 NOTE — Op Note (Signed)
  NAME:  Chad Moss, Chad Moss NO.:  [id number]  MEDICAL RECORD NO.:  [id number]  LOCATION:                                 FACILITY:  PHYSICIAN:  Danae Chen, M.D.       DATE OF BIRTH:  DATE OF PROCEDURE:  01/31/2011 DATE OF DISCHARGE:                              OPERATIVE REPORT   PREOPERATIVE DIAGNOSIS:  Erectile dysfunction.  POSTOPERATIVE DIAGNOSIS:  Erectile dysfunction.  PROCEDURE:  Insertion of a semi rigid prosthesis.  SURGEON:  Danae Chen, M.D.  ANESTHESIA:  General.  INDICATIONS:  The patient is a 55 year old male who has a long history of difficulty achieving erections.  He has tried PDE5 without any success.  He has a vacuum pump.  However, he has not been using it.  He is not interested in penile injections and he would like to have a penile prosthesis.  The different types of prosthesis were presented to the patient and he elected to have a semirigid prosthesis.  He is scheduled today for the procedure.  The risks of the procedure were discussed with him.  They include, but are not limited to hemorrhage, infection, urethral injury, and penile erosions.  He understands and would like to proceed.  The patient was identified by his wristband and proper time-out was taken.  DESCRIPTION OF PROCEDURE:  Under general anesthesia, the patient was prepped and draped and placed in the supine position.  A #16-French Foley catheter was inserted in the bladder.  The penoscrotal junction was infiltrated with 0.25% Marcaine.  A longitudinal incision was made at the penoscrotal junction.  The incision was carried down to the corpora cavernosa.  The urethra was identified and preserved throughout the procedure.  Two stay sutures were placed on each corpus.  Then a corporotomy was done on each side.  Then each corpus was gradually dilated with Hegar dilators up to #13.  Then, the length of the corpora was found to be 20 cm.  Each corpus was copiously irrigated  with bug juice.  Then a 20 cm long x 12 mm diameter semirigid prosthesis was inserted in each corpus.  The distal end of the prosthesis is in the glans penis.  There is no SST deformity of the glans.  Then, the wound was irrigated with Bug juice.  Hemostasis was secured with electrocautery. The corporotomies were closed with # OO Vicryl. Then, the wound was closed in two layers with #3-0 Vicryl.  Sterile dressing was then applied to the wound.  The Foley catheter will be left indwelling for 24 hours.  It will be removed in the morning.  The patient tolerated the procedure well and left the OR in satisfactory condition to post anesthesia care unit.  Estimated blood loss was minimal.  Needle, sponge, and instrument counts were correct on two occasions.     Danae Chen, M.D.     MN/MEDQ  D:  01/31/2011  T:  02/01/2011  Job:  [id number]  Electronically Signed by Lindaann Slough M.D. on 02/03/2011 07:35:57 AM

## 2011-02-23 ENCOUNTER — Emergency Department (HOSPITAL_COMMUNITY)
Admission: EM | Admit: 2011-02-23 | Discharge: 2011-02-23 | Discharge status: Against Medical Advice | Payer: Medicare Other | Attending: Emergency Medicine | Admitting: Emergency Medicine

## 2011-02-23 DIAGNOSIS — R42 Dizziness and giddiness: Secondary | ICD-10-CM | POA: Insufficient documentation

## 2011-02-23 DIAGNOSIS — R5381 Other malaise: Secondary | ICD-10-CM | POA: Insufficient documentation

## 2011-02-23 DIAGNOSIS — R5383 Other fatigue: Secondary | ICD-10-CM | POA: Insufficient documentation

## 2011-03-23 ENCOUNTER — Encounter: Payer: Self-pay | Admitting: Internal Medicine

## 2011-03-29 ENCOUNTER — Ambulatory Visit: Payer: Medicare Other | Admitting: Internal Medicine

## 2011-04-06 ENCOUNTER — Ambulatory Visit (INDEPENDENT_AMBULATORY_CARE_PROVIDER_SITE_OTHER): Payer: Medicare Other | Admitting: Internal Medicine

## 2011-04-06 ENCOUNTER — Encounter: Payer: Self-pay | Admitting: Internal Medicine

## 2011-04-06 VITALS — BP 102/70 | HR 64 | Temp 97.9°F | Wt 204.2 lb

## 2011-04-06 DIAGNOSIS — F17200 Nicotine dependence, unspecified, uncomplicated: Secondary | ICD-10-CM

## 2011-04-06 DIAGNOSIS — Z720 Tobacco use: Secondary | ICD-10-CM

## 2011-04-06 DIAGNOSIS — J449 Chronic obstructive pulmonary disease, unspecified: Secondary | ICD-10-CM

## 2011-04-06 DIAGNOSIS — Z23 Encounter for immunization: Secondary | ICD-10-CM

## 2011-04-06 HISTORY — DX: Tobacco use: Z72.0

## 2011-04-06 MED ORDER — ALBUTEROL SULFATE HFA 108 (90 BASE) MCG/ACT IN AERS: 2.0000 | INHALATION_SPRAY | Freq: Four times a day (QID) | RESPIRATORY_TRACT | Status: DC | PRN

## 2011-04-06 MED ORDER — TIOTROPIUM BROMIDE MONOHYDRATE 18 MCG IN CAPS: 18.0000 ug | ORAL_CAPSULE | Freq: Every day | RESPIRATORY_TRACT | Status: DC

## 2011-04-06 NOTE — Assessment & Plan Note (Signed)
#  Smoking  - please be careful and do not lapse back like you did in June 2012 #Followup  - 6 months or sooner if needed  3 min counseling to never relapse to cigs

## 2011-04-06 NOTE — Progress Notes (Signed)
  Subjective:    Patient ID: Chad Moss, male    DOB: January 18, 1956, 55 y.o.   MRN: [id number]  HPI July 13, 2010: 9 month followup stage 2 COPD. Last seen April 2011. In interim, has completed pulmonary rehab. Has had flu shot. States compliance with spiriva. No new complaints. Feels well. Hardly any dyspnea now. Hardly any cough. Attribtes all improvement to rehab and spiriva. Unable to quantify degree of improvement but he states he is happy with his current quality of life. Denies chest pain, cough, edema, orthopnea, pnd, hemoptysis, edema, syncope.   Patient Instructions:  1) continue spiriva  2) congratulations on finishing exercise program  3) - do daily walks and do the exercies they taught you  4) we will check your blood for alpha 1 a genetic cause of copd - MM 5) return in 9 months or sooner if you are sick  6) i am glad you are doing as well as you can  OV 04/07/11: Here with live in caretaker Chad Moss (she is talking on cell phone to someone else and would not hang up until I told her. She was also eating in room when nurse went in). Asymptomatic per caretaker and per patient. They both state that he is compliant with medications though caretaker has no idea what they are. She is unsure why he is on propranalol and lisinopril thought both deny dyspnea or cough.  Reported to have quit smokng in 1998 but surprinngly caretaker admitted that patient smoked "a little" in June 2012. Apparently he has quit since being reprimanded    Review of Systems  Constitutional: Negative for fever and unexpected weight change.  HENT: Negative for ear pain, nosebleeds, congestion, sore throat, rhinorrhea, sneezing, trouble swallowing, dental problem, postnasal drip and sinus pressure.   Eyes: Negative for redness and itching.  Respiratory: Negative for cough, chest tightness, shortness of breath and wheezing.   Cardiovascular: Negative for palpitations and leg swelling.  Gastrointestinal:  Negative for nausea and vomiting.  Genitourinary: Negative for dysuria.  Musculoskeletal: Negative for joint swelling.  Skin: Negative for rash.  Neurological: Negative for headaches.  Hematological: Does not bruise/bleed easily.  Psychiatric/Behavioral: Negative for dysphoric mood. The patient is not nervous/anxious.        Objective:   Physical Exam  General: well developed, well nourished, in no acute distressobese.  Head: normocephalic and atraumatic  Eyes: PERRLA/EOM intact; conjunctiva and sclera clear  Ears: TMs intact and clear with normal canals  Nose: no deformity, discharge, inflammation, or lesions  Mouth: no deformity or lesions  Neck: no masses, thyromegaly, or abnormal cervical nodes  Chest Wall: no deformities noted  Lungs: clear bilaterally to auscultation and percussiondecreased BS bilateral.  Heart: regular rate and rhythm, S1, S2 without murmurs, rubs, gallops, or clicks  Abdomen: abd softmidline scar.  no mass  non tenderness  obese  Msk: no deformity or scoliosis noted with normal posture  Pulses: pulses normal  Extremities: no clubbing, cyanosis, edema, or deformity noted  Neurologic: CN II-XII grossly intact with normal reflexes, coordination, muscle strength and tone  Skin: intact without lesions or rashes  Cervical Nodes: no significant adenopathy  Axillary Nodes: no significant adenopathy  Psych: depressed affect, oriented, and poor historian.       Assessment & Plan:

## 2011-04-06 NOTE — Patient Instructions (Signed)
#  COPD  - is stable. Glad you do not have any symptoms  - have flu shot today - continue spiriva daily - use albuterol as needed  - nurse will do refills of above medications - Need to know who is giving you propranaolol and lisinopril; these can make you more short of breath or have cough in the future. I need the doctor's name so I can talk to them to see if you really need these #Smoking  - please be careful and do not lapse back like you did in June 2012 #Followup  - 6 months or sooner if needed

## 2011-04-06 NOTE — Assessment & Plan Note (Signed)
#  COPD  - is stable. Glad you do not have any symptoms  - have flu shot today - continue spiriva daily - use albuterol as needed  - nurse will do refills of above medications - Need to know who is giving you propranaolol and lisinopril; these can make you more short of breath or have cough in the future. I need the doctor's name so I can talk to them to see if you really need these

## 2011-09-08 ENCOUNTER — Telehealth: Payer: Self-pay | Admitting: Internal Medicine

## 2011-09-08 MED ORDER — DOXYCYCLINE HYCLATE 100 MG PO TABS: 100.0000 mg | ORAL_TABLET | Freq: Two times a day (BID) | ORAL | Status: AC

## 2011-09-08 MED ORDER — PREDNISONE 10 MG PO TABS: ORAL_TABLET | ORAL | Status: DC

## 2011-09-08 NOTE — Telephone Encounter (Signed)
Pt caregiver aware. rx sent.[place] Bing, CMA

## 2011-09-08 NOTE — Telephone Encounter (Signed)
Pt c/o yellow nasal drainage, prod cough, nasal passages burn, sinuses feel full, some chills. Pt denies any fever or increased sob. Per Cherly Hensen, pt is smoking again. Will send msg to Mr for recs. There are no appts available in the office today with any provider.

## 2011-09-08 NOTE — Telephone Encounter (Signed)
Likely mild attack of copd called COPD exacerbation Please take doxycycline 168m twice daily after meals x 5 days; avoid sunlight Please take prednisone 491monce daily x 3 days, then 2038mnce daily x 3 days, then 62m36mce daily x 3 days, then 5mg 29me dailyx 3 days and stop If worse go to ER

## 2011-09-25 ENCOUNTER — Telehealth: Payer: Self-pay | Admitting: Internal Medicine

## 2011-09-25 ENCOUNTER — Encounter: Payer: Self-pay | Admitting: Pulmonary Disease

## 2011-09-25 ENCOUNTER — Ambulatory Visit (INDEPENDENT_AMBULATORY_CARE_PROVIDER_SITE_OTHER): Payer: Medicare Other | Admitting: Pulmonary Disease

## 2011-09-25 VITALS — BP 106/54 | HR 72 | Temp 98.2°F | Ht 71.0 in | Wt 192.0 lb

## 2011-09-25 DIAGNOSIS — J309 Allergic rhinitis, unspecified: Secondary | ICD-10-CM

## 2011-09-25 MED ORDER — NICOTINE 14 MG/24HR TD PT24: 1.0000 | MEDICATED_PATCH | TRANSDERMAL | Status: AC

## 2011-09-25 MED ORDER — CHLORPHENIRAMINE MALEATE 4 MG PO TABS: ORAL_TABLET | ORAL | Status: DC

## 2011-09-25 NOTE — Telephone Encounter (Signed)
I spoke with the pt  Caregiver and she states the pt symptoms have returned. He was given pred and doxycycline on 09/08/11 for copd exacerbation. She states he felt better while on the meds but once he finished them his symptoms returned. She states he is having increased productive cough with yellow phlegm, chest tightness and wheezing. He denies any SOB. Appt made for today at 11am with KC. Carron Curie, CMA

## 2011-09-25 NOTE — Progress Notes (Signed)
  Subjective:    Patient ID: Chad Moss, male    DOB: July 29, 1955, 56 y.o.   MRN: [id number]  HPI Patient comes in today for an acute sick visit.  He is usually followed for COPD, and unfortunately has been smoking since June of last year.  He gives a two-week history of rhinorrhea without purulence, a sore throat, as well as a cough with white mucus.  He does not have chest congestion, nor does he feel his breathing is worse than his usual baseline.  He notes significant postnasal drip.   Review of Systems  Constitutional: Positive for chills. Negative for fever and unexpected weight change.  HENT: Positive for rhinorrhea. Negative for ear pain, nosebleeds, congestion, sore throat, sneezing, trouble swallowing, dental problem, postnasal drip and sinus pressure.   Eyes: Negative for redness and itching.  Respiratory: Positive for cough. Negative for chest tightness, shortness of breath and wheezing.   Cardiovascular: Negative for palpitations and leg swelling.  Gastrointestinal: Negative for nausea and vomiting.  Genitourinary: Negative for dysuria.  Musculoskeletal: Negative for joint swelling.  Skin: Negative for rash.  Neurological: Negative for headaches.  Hematological: Does not bruise/bleed easily.  Psychiatric/Behavioral: Negative for dysphoric mood. The patient is not nervous/anxious.        Objective:   Physical Exam Well-developed male in no acute distress Nose with mild inflammatory changes, but no purulence Oropharynx clear without exudates Chest with fairly clear breath sounds, no wheezes Cardiac exam is regular rate and rhythm Lower extremities with no significant edema, no cyanosis Alert and oriented, moves all 4 extremities.       Assessment & Plan:

## 2011-09-25 NOTE — Patient Instructions (Addendum)
Get chlorpheniramine 31m over the counter, and take at bedtime and lunch for a week to see if runny nose/cough gets better Can take robitussin dm over the counter for the cough is needed. Use saline nasal spray for "nasal soreness", congestion.  You must stop smoking 100% if you want to stay well.  Keep followup apptm with Dr. RChase Caller

## 2011-09-25 NOTE — Assessment & Plan Note (Signed)
I suspect the patient's current symptoms are related to significant allergic rhinitis with postnasal drip given the increase in pollen most recently.  However, I cannot exclude the possibility of a viral URI.  At this point, I would like to treat him symptomatically.  Will start on chlorpheniramine for his allergy symptoms, as well as saline nasal spray.  I also encouraged him to stop smoking 100%, and have given him a prescription for the nicotine patch.

## 2011-10-02 ENCOUNTER — Ambulatory Visit
Admission: RE | Admit: 2011-10-02 | Discharge: 2011-10-02 | Disposition: A | Discharge status: Home / Self Care | Payer: Medicare Other | Source: Ambulatory Visit | Attending: Internal Medicine | Admitting: Internal Medicine

## 2011-10-02 ENCOUNTER — Other Ambulatory Visit: Payer: Self-pay | Admitting: Internal Medicine

## 2011-10-02 DIAGNOSIS — R05 Cough: Secondary | ICD-10-CM

## 2011-10-02 DIAGNOSIS — R059 Cough, unspecified: Secondary | ICD-10-CM

## 2011-10-02 IMAGING — CR DG CHEST 2V
2 series · 2 of 2 positions shown · non-contrast
Comparison: [DATE]

CLINICAL DATA: Cough, history smoking, hypertension, Crohn's
disease

CHEST - 2 VIEW

[w chest pa]
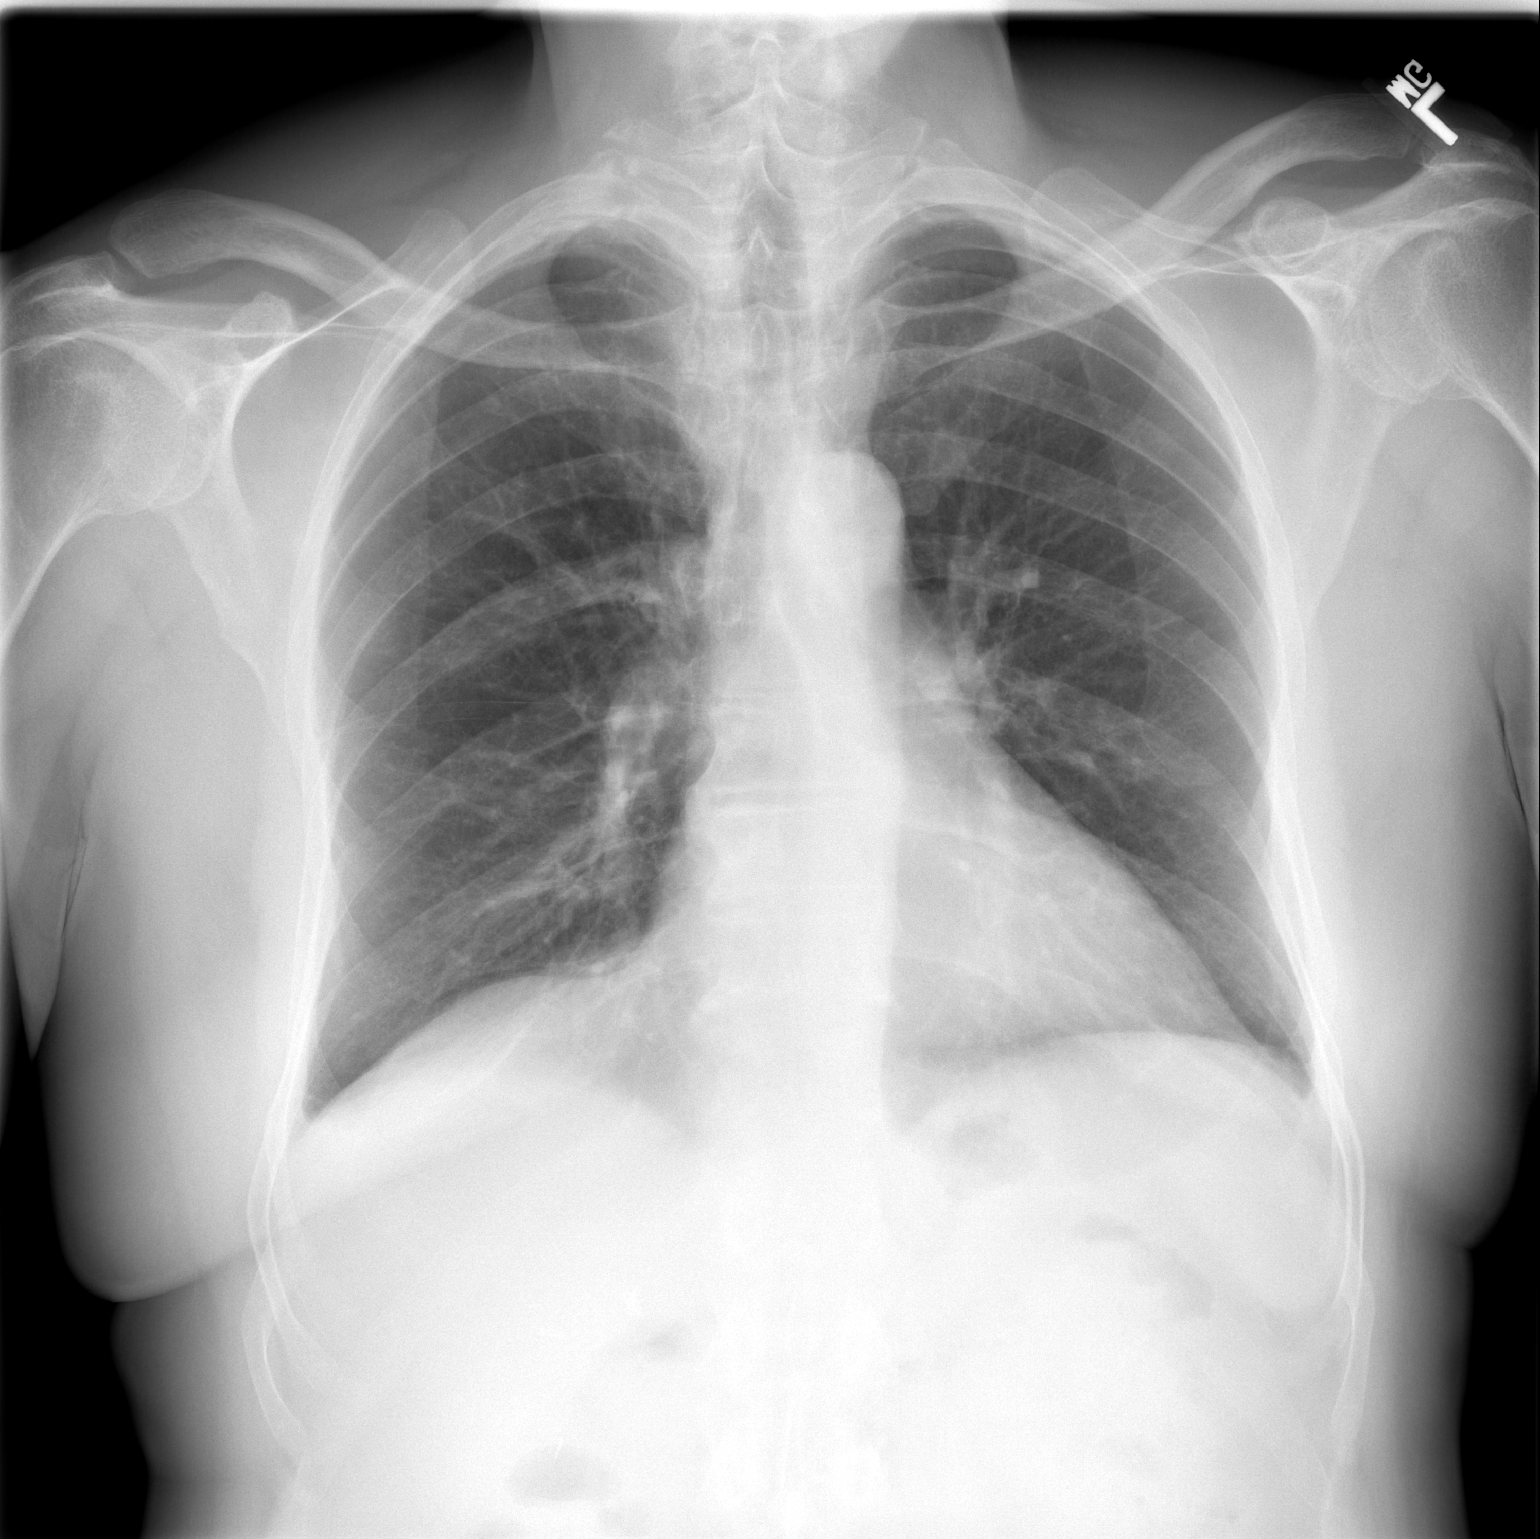

[w chest lat]
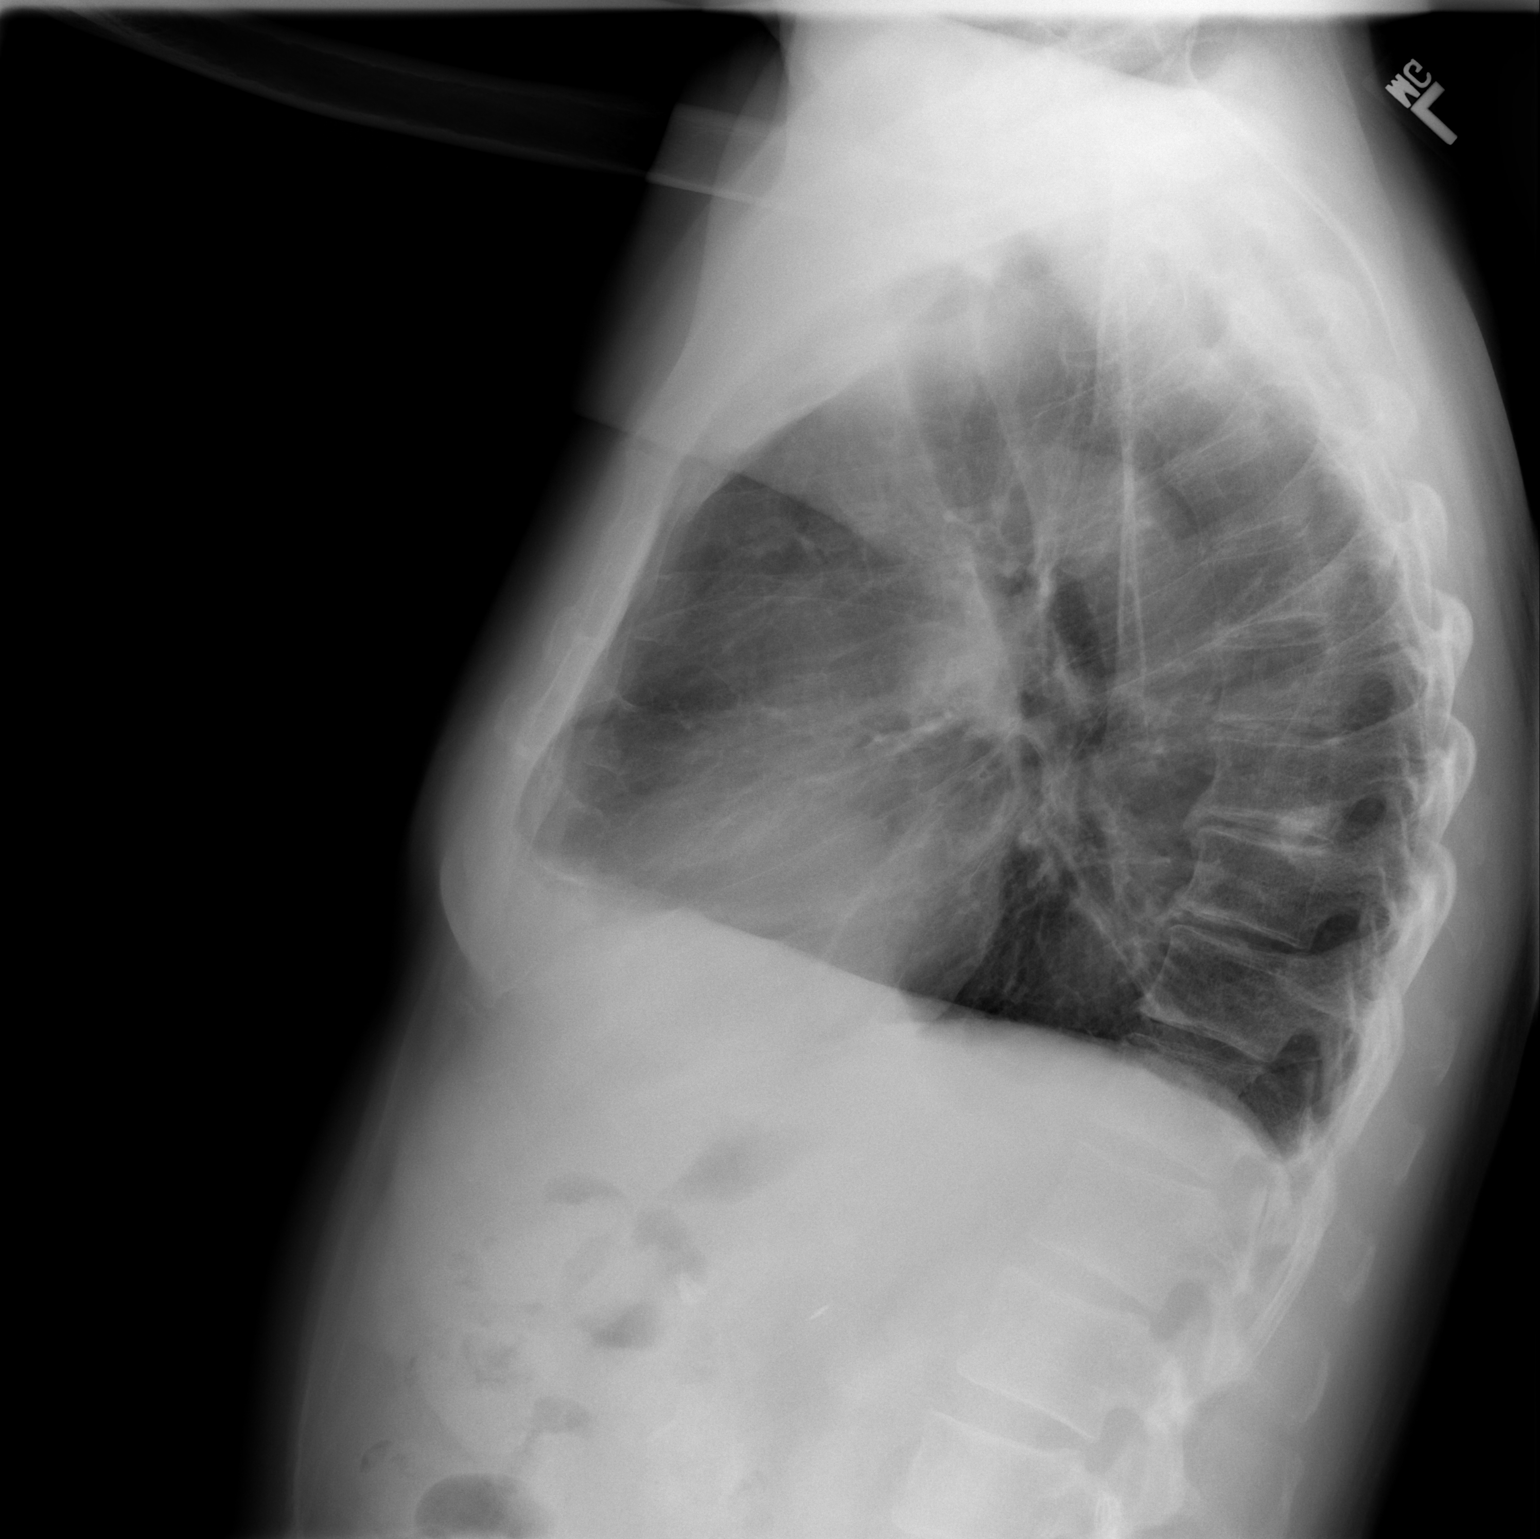

[2 of 2 positions shown; findings below may reference images not displayed]

FINDINGS: Normal heart size, mediastinal contours, and pulmonary vascularity.
Lungs clear.
No pleural effusion or pneumothorax.
Underlying emphysematous changes.
No acute osseous findings.
IMPRESSION: Emphysematous changes.
No acute abnormalities.

## 2011-10-23 ENCOUNTER — Other Ambulatory Visit: Payer: Self-pay | Admitting: Internal Medicine

## 2011-11-14 ENCOUNTER — Telehealth: Payer: Self-pay | Admitting: Internal Medicine

## 2011-11-14 ENCOUNTER — Encounter: Payer: Self-pay | Admitting: Internal Medicine

## 2011-11-14 ENCOUNTER — Ambulatory Visit (INDEPENDENT_AMBULATORY_CARE_PROVIDER_SITE_OTHER): Payer: Medicare Other | Admitting: Internal Medicine

## 2011-11-14 VITALS — BP 112/72 | HR 71 | Temp 97.9°F | Ht 71.0 in | Wt 191.0 lb

## 2011-11-14 DIAGNOSIS — F17200 Nicotine dependence, unspecified, uncomplicated: Secondary | ICD-10-CM

## 2011-11-14 DIAGNOSIS — R059 Cough, unspecified: Secondary | ICD-10-CM

## 2011-11-14 DIAGNOSIS — R05 Cough: Secondary | ICD-10-CM

## 2011-11-14 DIAGNOSIS — J449 Chronic obstructive pulmonary disease, unspecified: Secondary | ICD-10-CM

## 2011-11-14 DIAGNOSIS — Z720 Tobacco use: Secondary | ICD-10-CM

## 2011-11-14 DIAGNOSIS — R053 Chronic cough: Secondary | ICD-10-CM

## 2011-11-14 MED ORDER — TIOTROPIUM BROMIDE MONOHYDRATE 18 MCG IN CAPS: ORAL_CAPSULE | RESPIRATORY_TRACT | Status: DC

## 2011-11-14 MED ORDER — ALBUTEROL SULFATE HFA 108 (90 BASE) MCG/ACT IN AERS: 2.0000 | INHALATION_SPRAY | Freq: Four times a day (QID) | RESPIRATORY_TRACT | Status: DC | PRN

## 2011-11-14 NOTE — Progress Notes (Signed)
Subjective:    Patient ID: Chad Moss, male    DOB: [birth year], 56 y.o.   MRN: [id number]  HPI July 13, 2010: 9 month followup stage 2 COPD. Last seen April 2011. In interim, has completed pulmonary rehab. Has had flu shot. States compliance with spiriva. No new complaints. Feels well. Hardly any dyspnea now. Hardly any cough. Attribtes all improvement to rehab and spiriva. Unable to quantify degree of improvement but he states he is happy with his current quality of life. Denies chest pain, cough, edema, orthopnea, pnd, hemoptysis, edema, syncope.   Patient Instructions:  1) continue spiriva  2) congratulations on finishing exercise program  3) - do daily walks and do the exercies they taught you  4) we will check your blood for alpha 1 a genetic cause of copd - MM 5) return in 9 months or sooner if you are sick  6) i am glad you are doing as well as you can  OV 04/07/11: Here with live in caretaker Katina Degree (she is talking on cell phone to someone else and would not hang up until I told her. She was also eating in room when nurse went in). Asymptomatic per caretaker and per patient. They both state that he is compliant with medications though caretaker has no idea what they are. She is unsure why he is on propranalol and lisinopril thought both deny dyspnea or cough.  Reported to have quit smokng in 1998 but surprinngly caretaker admitted that patient smoked "a little" in June 2012. Apparently he has quit since being reprimanded  OV 09/25/11: Patient comes in today for an acute sick visit.  He is usually followed for COPD, and unfortunately has been smoking since June of last year.  He gives a two-week history of rhinorrhea without purulence, a sore throat, as well as a cough with white mucus.  He does not have chest congestion, nor does he feel his breathing is worse than his usual baseline.  He notes significant postnasal drip.  REC Get chlorpheniramine 59m over the counter, and  take at bedtime and lunch for a week to see if runny nose/cough gets better  Can take robitussin dm over the counter for the cough is needed.  Use saline nasal spray for "nasal soreness", congestion.  You must stop smoking 100% if you want to stay well.  Keep followup apptm with Dr. RChase Caller  OV 11/14/2011 PMD SMaximino Greenland MD  Followup COPD,  Smoking and multifactorial cough  - COPD - reports stability. CAT score is 6 and reflects minimal symptom burden. Overall stable. Compliant with medications; Tough time reconciling meds because his caretaker BKatina Degreeis quite unhelpful. WE had to call pharmacy. Noted he is on non specific beta blocker; [facility]. Unclear why he is getting this med. We have addressed this with caretaker in past but with no success  - SMoking - he is smoking despite ban on smoking at his facility. Knows he needs to quit but unable to. CAretaker knows he smokes as well  - Cough - some mild residual cough despite copd Rx +. He is on ace inhibitor and per caretaker PMD considering stopping this. No asspociated sputum. Cough is stable    CAT COPD Symptom and Quality of Life Score ([employer])  0 (no burden) to 5 (highest burden)  Never Cough -> Cough all the time 2  No phlegm in chest -> Chest is  full of phlegm 0  No chest tightness -> Chest feels very tight 0  No dyspnea for 1 flight stairs/hill -> Very dyspneic for 1 flight of stairs 00  No limitations for ADL at home -> Very limited with ADL at home 0  Confident leaving home -> Not at all confident leaving home 0  Sleep soundly -> Do not sleep soundly because of lung condition 0  Lots of Energy -> No energy at all 4  TOTAL Score (max 40)  6     Past, Family, Social reviewed: no change since last visit   Current outpatient prescriptions:albuterol (PROVENTIL HFA;VENTOLIN HFA) 108 (90 BASE) MCG/ACT inhaler, Inhale 2 puffs into the lungs  every 6 (six) hours as needed., Disp: 1 Inhaler, Rfl: 6;  alfuzosin (UROXATRAL) 10 MG 24 hr tablet, Take 10 mg by mouth daily.  , Disp: , Rfl: ;  b complex vitamins tablet, Take 1 tablet by mouth daily.  , Disp: , Rfl: ;  bimatoprost (LUMIGAN) 0.03 % ophthalmic solution, 1 drop as needed.  , Disp: , Rfl:  brimonidine (ALPHAGAN) 0.15 % ophthalmic solution, Place 1 drop into both eyes 2 (two) times daily. , Disp: , Rfl: ;  chlorpheniramine (CHLOR-TRIMETON) 4 MG tablet, Take 8 mg at lunch and at bedtime, Disp: 30 tablet, Rfl: 0;  cholestyramine (QUESTRAN) 4 G packet, Take 1 packet by mouth daily. , Disp: , Rfl: ;  Choline Fenofibrate (TRILIPIX) 135 MG capsule, 1 tablet every other day , Disp: , Rfl:  clonazePAM (KLONOPIN) 1 MG tablet, Take 0.5 mg by mouth 2 (two) times daily as needed. , Disp: , Rfl: ;  dicyclomine (BENTYL) 10 MG capsule, Take 10 mg by mouth daily.  , Disp: , Rfl: ;  dutasteride (AVODART) 0.5 MG capsule, Take 0.5 mg by mouth daily.  , Disp: , Rfl: ;  esomeprazole (NEXIUM) 20 MG capsule, Take 20 mg by mouth daily before breakfast.  , Disp: , Rfl: ;  fesoterodine (TOVIAZ) 4 MG TB24, Take 4 mg by mouth daily., Disp: , Rfl:  lisinopril (PRINIVIL,ZESTRIL) 5 MG tablet, Take 5 mg by mouth daily.  , Disp: , Rfl: ;  loxapine (LOXITANE) 10 MG capsule, Take 15 mg by mouth 2 (two) times daily. , Disp: , Rfl: ;  mercaptopurine (PURINETHOL) 50 MG tablet, Take 50 mg by mouth daily. Give on an empty stomach 1 hour before or 2 hours after meals. Caution: Chemotherapy. , Disp: , Rfl: ;  mesalamine (PENTASA) 250 MG CR capsule, Take 250 mg by mouth daily.  , Disp: , Rfl:  potassium chloride (KLOR-CON) 10 MEQ CR tablet, Take 20 mEq by mouth daily.  , Disp: , Rfl: ;  propranolol (INDERAL) 10 MG tablet, Take 10 mg by mouth 4 (four) times daily. , Disp: , Rfl: ;  rosuvastatin (CRESTOR) 40 MG tablet, Take 40 mg by mouth daily., Disp: , Rfl: ;  SPIRIVA HANDIHALER 18 MCG inhalation capsule, PLACE 1 CAPSULE INTO INHALER AND  INHALE EVERY DAY, Disp: 30 capsule, Rfl: 5 traZODone (DESYREL) 100 MG tablet, Take 150 mg by mouth at bedtime. , Disp: , Rfl: ;  Vitamin D, Ergocalciferol, (DRISDOL) 50000 UNITS CAPS, Take 50,000 Units by mouth every 7 (seven) days.  , Disp: , Rfl: ;  perphenazine (TRILAFON) 4 MG tablet, Take 4 mg by mouth daily.  , Disp: , Rfl:      Review of Systems  Constitutional: Negative for fever and unexpected weight change.  HENT: Negative for ear pain, nosebleeds, congestion, sore  throat, rhinorrhea, sneezing, trouble swallowing, dental problem, postnasal drip and sinus pressure.   Eyes: Negative for redness and itching.  Respiratory: Positive for cough. Negative for chest tightness, shortness of breath and wheezing.   Cardiovascular: Negative for palpitations and leg swelling.  Gastrointestinal: Negative for nausea and vomiting.  Genitourinary: Negative for dysuria.  Musculoskeletal: Negative for joint swelling.  Skin: Negative for rash.  Neurological: Negative for headaches.  Hematological: Does not bruise/bleed easily.  Psychiatric/Behavioral: Negative for dysphoric mood. The patient is not nervous/anxious.        Objective:   Physical Exam  Nursing note and vitals reviewed. Constitutional: He is oriented to person, place, and time. He appears well-developed and well-nourished. No distress.  HENT:  Head: Normocephalic and atraumatic.  Right Ear: External ear normal.  Left Ear: External ear normal.  Mouth/Throat: Oropharynx is clear and moist. No oropharyngeal exudate.  Eyes: Conjunctivae and EOM are normal. Pupils are equal, round, and reactive to light. Right eye exhibits no discharge. Left eye exhibits no discharge. No scleral icterus.  Neck: Normal range of motion. Neck supple. No JVD present. No tracheal deviation present. No thyromegaly present.  Cardiovascular: Normal rate, regular rhythm and intact distal pulses.  Exam reveals no gallop and no friction rub.   No murmur  heard. Pulmonary/Chest: Effort normal and breath sounds normal. No respiratory distress. He has no wheezes. He has no rales. He exhibits no tenderness.  Abdominal: Soft. Bowel sounds are normal. He exhibits no distension and no mass. There is no tenderness. There is no rebound and no guarding.  Musculoskeletal: Normal range of motion. He exhibits no edema and no tenderness.  Lymphadenopathy:    He has no cervical adenopathy.  Neurological: He is alert and oriented to person, place, and time. He has normal reflexes. No cranial nerve deficit. Coordination normal.  Skin: Skin is warm and dry. No rash noted. He is not diaphoretic. No erythema. No pallor.  Psychiatric: He has a normal mood and affect. His behavior is normal. Judgment and thought content normal.          Assessment & Plan:

## 2011-11-14 NOTE — Telephone Encounter (Signed)
Dear Dr Bobetta Lime  A) would it be possible for you to stop ace inhibiutor due to his cough ?  B) also do you know why he would be on propranalol (non specific beta blocker) while having copd ?  THanks  [employer]

## 2011-11-14 NOTE — Patient Instructions (Signed)
#  COPD - it is stable   - please continue your current inahler regimen  - please get Dr Sheryle Hail at [facility] to stop your propranolol in presence of copd  #Cough  - please get Dr Glendale Chard to stop your lisinopril because this is making cough worse - she can do some other medication for high bp  #Smoking   Please workn on quitting smoking  #Followup  - 3 months or sooner if needed

## 2011-11-14 NOTE — Telephone Encounter (Signed)
Error.  Duplicate message.  Holly D Pryor °- °

## 2011-11-14 NOTE — Telephone Encounter (Signed)
I called and left Dr. Chase Caller cell number for Dr. Theda Sers to discuss the pt with MR. I will forward message back to Dr. Tye Savoy to address pt lisinopril. Belington Bing, CMA

## 2011-11-14 NOTE — Telephone Encounter (Signed)
Dr. Theda Sers called regarding pt's Rx for propranolol.  Dr. Theda Sers stated she received a phone call from pt's caregiver that MR would like pt to stop taking this.  Dr. Theda Sers stated that he is taking this for anxiety.  Dr. Theda Sers would like to discuss w/ MR & possible lowering dosage if possible instead of stopping entirely.  Dr. Theda Sers has asked to be reached at [phone] ext [phone].  Dr. Theda Sers stated this is a voice mail service & will return MR's call.  Satira Anis

## 2011-11-19 DIAGNOSIS — R05 Cough: Secondary | ICD-10-CM | POA: Insufficient documentation

## 2011-11-19 DIAGNOSIS — R053 Chronic cough: Secondary | ICD-10-CM

## 2011-11-19 HISTORY — DX: Chronic cough: R05.3

## 2011-11-19 NOTE — Assessment & Plan Note (Signed)
#  COPD - it is stable   - please continue your current inahler regimen  - please get Dr Sheryle Hail at [facility] to stop your propranolol in presence of copd

## 2011-11-19 NOTE — Assessment & Plan Note (Signed)
#  Smoking   Please workn on quitting smoking  The patient was counseled on the dangers of tobacco use, and was advised to quit and reluctant to quit.  Reviewed strategies to maximize success, including removing cigarettes and smoking materials from environment, substitution of other forms of reinforcement and support of family/friends  3 min counseling.   #Followup  - 3 months or sooner if needed

## 2011-11-19 NOTE — Assessment & Plan Note (Signed)
#  Cough  - please get Dr Glendale Chard to stop your lisinopril because this is making cough worse - she can do some other medication for high bp

## 2011-11-30 ENCOUNTER — Emergency Department (HOSPITAL_COMMUNITY): Payer: Medicare Other

## 2011-11-30 ENCOUNTER — Encounter (HOSPITAL_COMMUNITY): Payer: Self-pay | Admitting: *Deleted

## 2011-11-30 ENCOUNTER — Emergency Department (HOSPITAL_COMMUNITY)
Admission: EM | Admit: 2011-11-30 | Discharge: 2011-11-30 | Disposition: A | Discharge status: Home / Self Care | Payer: Medicare Other | Attending: Emergency Medicine | Admitting: Emergency Medicine

## 2011-11-30 DIAGNOSIS — E785 Hyperlipidemia, unspecified: Secondary | ICD-10-CM | POA: Insufficient documentation

## 2011-11-30 DIAGNOSIS — I10 Essential (primary) hypertension: Secondary | ICD-10-CM | POA: Insufficient documentation

## 2011-11-30 DIAGNOSIS — F17200 Nicotine dependence, unspecified, uncomplicated: Secondary | ICD-10-CM | POA: Insufficient documentation

## 2011-11-30 DIAGNOSIS — K5090 Crohn's disease, unspecified, without complications: Secondary | ICD-10-CM | POA: Insufficient documentation

## 2011-11-30 DIAGNOSIS — R0609 Other forms of dyspnea: Secondary | ICD-10-CM | POA: Insufficient documentation

## 2011-11-30 DIAGNOSIS — E860 Dehydration: Secondary | ICD-10-CM

## 2011-11-30 DIAGNOSIS — R5381 Other malaise: Secondary | ICD-10-CM | POA: Insufficient documentation

## 2011-11-30 DIAGNOSIS — J4489 Other specified chronic obstructive pulmonary disease: Secondary | ICD-10-CM | POA: Insufficient documentation

## 2011-11-30 DIAGNOSIS — H409 Unspecified glaucoma: Secondary | ICD-10-CM | POA: Insufficient documentation

## 2011-11-30 DIAGNOSIS — I739 Peripheral vascular disease, unspecified: Secondary | ICD-10-CM | POA: Insufficient documentation

## 2011-11-30 DIAGNOSIS — R1310 Dysphagia, unspecified: Secondary | ICD-10-CM | POA: Insufficient documentation

## 2011-11-30 DIAGNOSIS — K589 Irritable bowel syndrome without diarrhea: Secondary | ICD-10-CM | POA: Insufficient documentation

## 2011-11-30 DIAGNOSIS — R0989 Other specified symptoms and signs involving the circulatory and respiratory systems: Secondary | ICD-10-CM | POA: Insufficient documentation

## 2011-11-30 DIAGNOSIS — J449 Chronic obstructive pulmonary disease, unspecified: Secondary | ICD-10-CM | POA: Insufficient documentation

## 2011-11-30 HISTORY — DX: Chronic obstructive pulmonary disease, unspecified: J44.9

## 2011-11-30 LAB — URINALYSIS, ROUTINE W REFLEX MICROSCOPIC
Bilirubin Urine: NEGATIVE
Glucose, UA: NEGATIVE mg/dL
Hgb urine dipstick: NEGATIVE
Ketones, ur: NEGATIVE mg/dL
Leukocytes, UA: NEGATIVE
Nitrite: NEGATIVE
Protein, ur: NEGATIVE mg/dL
Specific Gravity, Urine: 1.01 (ref 1.005–1.030)
Urobilinogen, UA: 1 mg/dL (ref 0.0–1.0)
pH: 6 (ref 5.0–8.0)

## 2011-11-30 LAB — COMPREHENSIVE METABOLIC PANEL
ALT: 18 U/L (ref 0–53)
AST: 27 U/L (ref 0–37)
Albumin: 4 g/dL (ref 3.5–5.2)
Alkaline Phosphatase: 31 U/L — ABNORMAL LOW (ref 39–117)
BUN: 13 mg/dL (ref 6–23)
CO2: 28 mEq/L (ref 19–32)
Calcium: 9.7 mg/dL (ref 8.4–10.5)
Chloride: 100 mEq/L (ref 96–112)
Creatinine, Ser: 2.03 mg/dL — ABNORMAL HIGH (ref 0.50–1.35)
GFR calc Af Amer: 40 mL/min — ABNORMAL LOW (ref >90)
GFR calc non Af Amer: 34 mL/min — ABNORMAL LOW (ref >90)
Glucose, Bld: 83 mg/dL (ref 70–99)
Potassium: 3.7 mEq/L (ref 3.5–5.1)
Sodium: 137 mEq/L (ref 135–145)
Total Bilirubin: 0.4 mg/dL (ref 0.3–1.2)
Total Protein: 7.7 g/dL (ref 6.0–8.3)

## 2011-11-30 LAB — DIFFERENTIAL
Basophils Absolute: 0 10*3/uL (ref 0.0–0.1)
Basophils Relative: 0 % (ref 0–1)
Eosinophils Absolute: 0.1 10*3/uL (ref 0.0–0.7)
Eosinophils Relative: 1 % (ref 0–5)
Lymphocytes Relative: 28 % (ref 12–46)
Lymphs Abs: 1.6 10*3/uL (ref 0.7–4.0)
Monocytes Absolute: 0.4 10*3/uL (ref 0.1–1.0)
Monocytes Relative: 8 % (ref 3–12)
Neutro Abs: 3.5 10*3/uL (ref 1.7–7.7)
Neutrophils Relative %: 63 % (ref 43–77)

## 2011-11-30 LAB — CBC
HCT: 38.4 % — ABNORMAL LOW (ref 39.0–52.0)
Hemoglobin: 13.3 g/dL (ref 13.0–17.0)
MCH: 31.4 pg (ref 26.0–34.0)
MCHC: 34.6 g/dL (ref 30.0–36.0)
MCV: 90.6 fL (ref 78.0–100.0)
Platelets: 235 10*3/uL (ref 150–400)
RBC: 4.24 MIL/uL (ref 4.22–5.81)
RDW: 14.2 % (ref 11.5–15.5)
WBC: 5.5 10*3/uL (ref 4.0–10.5)

## 2011-11-30 IMAGING — CR DG CHEST 1V PORT
1 series · 1 of 1 positions shown · non-contrast
Comparison: PA and lateral chest [DATE] and [DATE].  CT
chest [DATE].

CLINICAL DATA: Weakness, shortness of breath and hypertension.

PORTABLE CHEST - 1 VIEW

[AP]
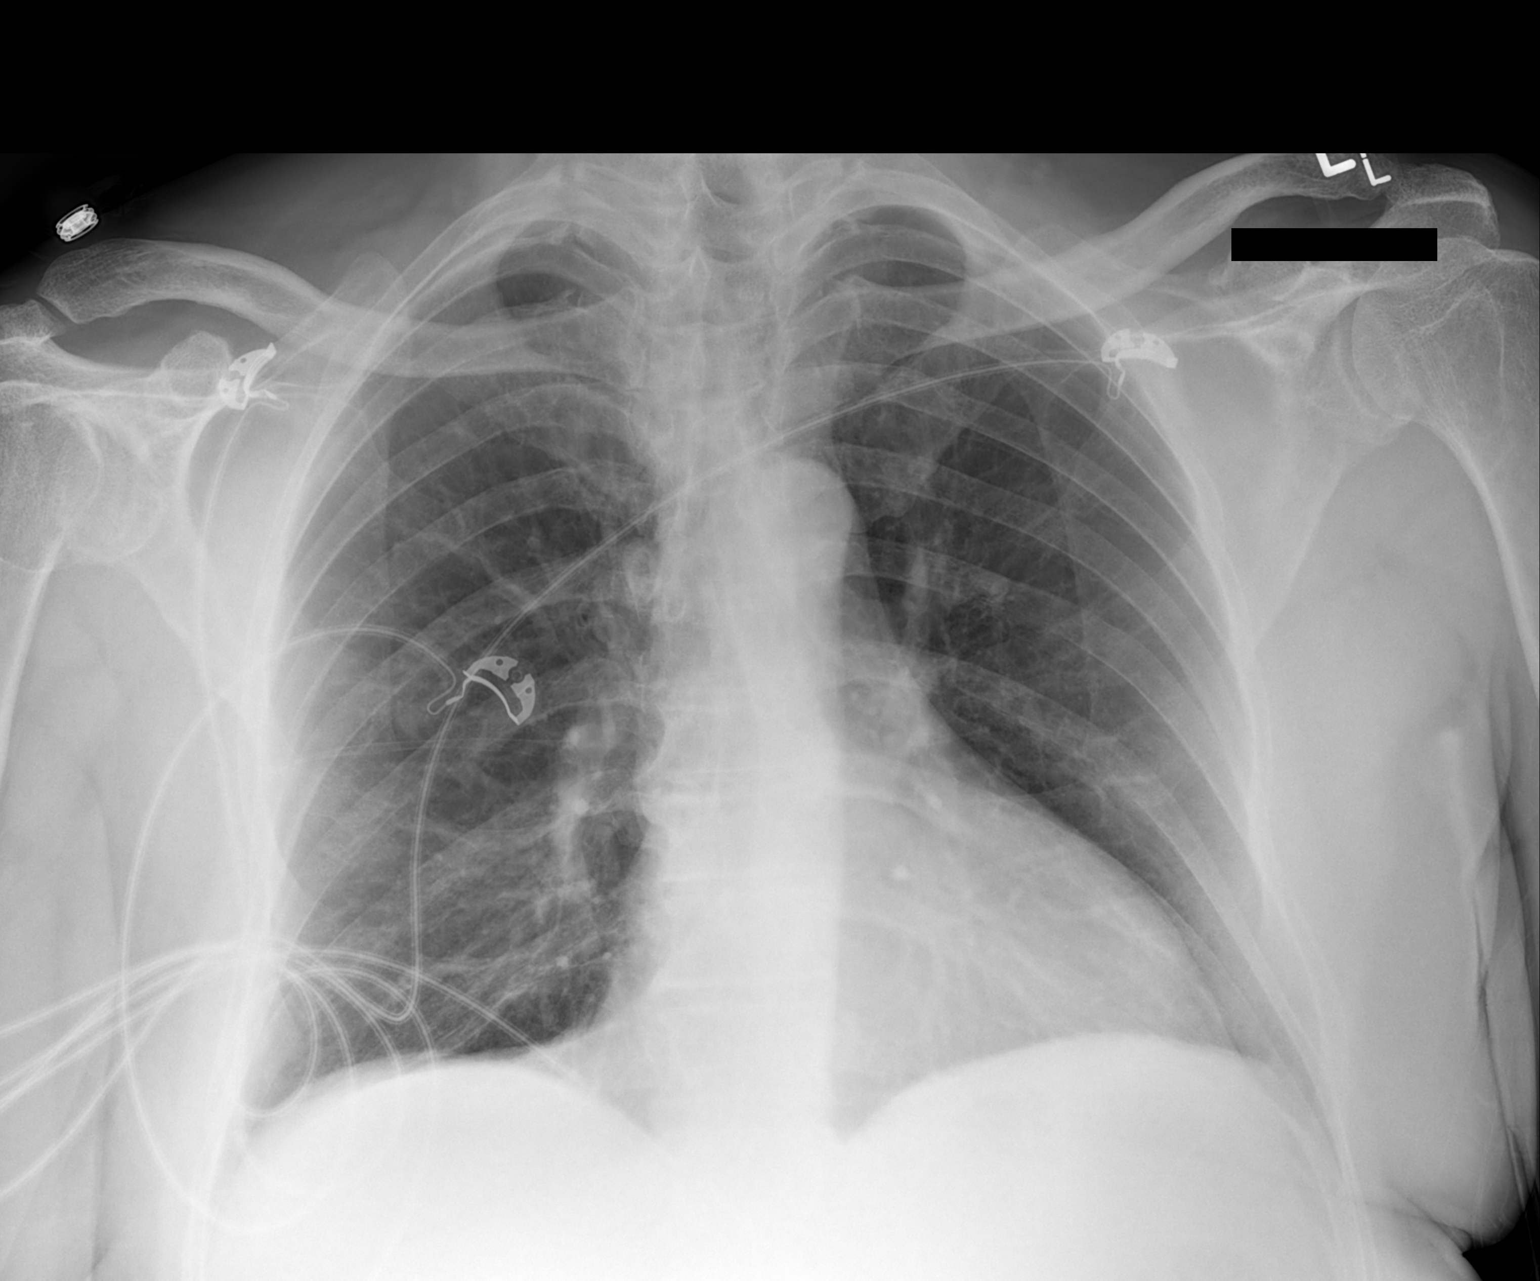

[1 of 1 positions shown; findings below may reference images not displayed]

FINDINGS: Lungs are clear.  No pneumothorax or pleural fluid.
Heart size upper normal.  No focal bony abnormality.
IMPRESSION: No acute finding.

## 2011-11-30 MED ORDER — SODIUM CHLORIDE 0.9 % IV BOLUS (SEPSIS)
500.0000 mL | Freq: Once | INTRAVENOUS | Status: AC
  Administered 2011-11-30: 500 mL via INTRAVENOUS

## 2011-11-30 NOTE — Discharge Instructions (Signed)
YOU CAN BE DISCHARGED HOME AND SHOULD FOLLOW UP WITH DR. SANDERS IN 1-2 DAYS FOR RECHECK. PUSH FLUIDS, MAINTAIN A NORMAL DIET AND TAKE MEDICATIONS AS PRESCRIBED. RETURN HERE WITH ANY WORSENING SYMPTOMS OR NEW CONCERNS.  Dehydration, Adult Dehydration is when you lose more fluids from the body than you take in. Vital organs like the kidneys, brain, and heart cannot function without a proper amount of fluids and salt. Any loss of fluids from the body can cause dehydration.  CAUSES   Vomiting.   Diarrhea.   Excessive sweating.   Excessive urine output.   Fever.  SYMPTOMS  Mild dehydration  Thirst.   Dry lips.   Slightly dry mouth.  Moderate dehydration  Very dry mouth.   Sunken eyes.   Skin does not bounce back quickly when lightly pinched and released.   Dark urine and decreased urine production.   Decreased tear production.   Headache.  Severe dehydration  Very dry mouth.   Extreme thirst.   Rapid, weak pulse (more than 100 beats per minute at rest).   Cold hands and feet.   Not able to sweat in spite of heat and temperature.   Rapid breathing.   Blue lips.   Confusion and lethargy.   Difficulty being awakened.   Minimal urine production.   No tears.  DIAGNOSIS  Your caregiver will diagnose dehydration based on your symptoms and your exam. Blood and urine tests will help confirm the diagnosis. The diagnostic evaluation should also identify the cause of dehydration. TREATMENT  Treatment of mild or moderate dehydration can often be done at home by increasing the amount of fluids that you drink. It is best to drink small amounts of fluid more often. Drinking too much at one time can make vomiting worse. Refer to the home care instructions below. Severe dehydration needs to be treated at the hospital where you will probably be given intravenous (IV) fluids that contain water and electrolytes. HOME CARE INSTRUCTIONS   Ask your caregiver about specific  rehydration instructions.   Drink enough fluids to keep your urine clear or pale yellow.   Drink small amounts frequently if you have nausea and vomiting.   Eat as you normally do.   Avoid:   Foods or drinks high in sugar.   Carbonated drinks.   Juice.   Extremely hot or cold fluids.   Drinks with caffeine.   Fatty, greasy foods.   Alcohol.   Tobacco.   Overeating.   Gelatin desserts.   Wash your hands well to avoid spreading bacteria and viruses.   Only take over-the-counter or prescription medicines for pain, discomfort, or fever as directed by your caregiver.   Ask your caregiver if you should continue all prescribed and over-the-counter medicines.   Keep all follow-up appointments with your caregiver.  SEEK MEDICAL CARE IF:  You have abdominal pain and it increases or stays in one area (localizes).   You have a rash, stiff neck, or severe headache.   You are irritable, sleepy, or difficult to awaken.   You are weak, dizzy, or extremely thirsty.  SEEK IMMEDIATE MEDICAL CARE IF:   You are unable to keep fluids down or you get worse despite treatment.   You have frequent episodes of vomiting or diarrhea.   You have blood or green matter (bile) in your vomit.   You have blood in your stool or your stool looks black and tarry.   You have not urinated in 6 to 8 hours, or  you have only urinated a small amount of very dark urine.   You have a fever.   You faint.  MAKE SURE YOU:   Understand these instructions.   Will watch your condition.   Will get help right away if you are not doing well or get worse.  Document Released: 06/05/2005 Document Revised: 05/25/2011 Document Reviewed: 01/23/2011 [facility] Patient Information 2012 [place].

## 2011-11-30 NOTE — ED Notes (Signed)
Pt states for the past 3 weeks he's been feeling weak, dizzy, and lightheaded, states blood pressure was low, started new medication in the past week (losartan 54m) was taking losartan/hctz 561mbefore. Pt also started quetiapine started on 6/7. Pt almost fell yesterday, catching himself on the chair d/t the dizziness/weakness. Pt states is dizzy when walking. Denies pain or shortness of breath.

## 2011-11-30 NOTE — ED Provider Notes (Signed)
Medical screening examination/treatment/procedure(s) were performed by non-physician practitioner and as supervising physician I was immediately available for consultation/collaboration.   Carmin Muskrat, MD 11/30/11 [id number]

## 2011-11-30 NOTE — ED Provider Notes (Signed)
History     CSN: [id number]  Arrival date & time 11/30/11  1331   First MD Initiated Contact with Patient 11/30/11 1425      Chief Complaint  Patient presents with  . Weakness    (Consider location/radiation/quality/duration/timing/severity/associated sxs/prior treatment) Patient is a 56 y.o. male presenting with weakness. The history is provided by the patient.  Weakness The primary symptoms include dizziness. Primary symptoms do not include loss of consciousness, fever or vomiting.  Dizziness also occurs with weakness. Dizziness does not occur with vomiting.  Additional symptoms include weakness. Associated symptoms comments: He reports increasing weakness for the past 3 weeks, decreased appetite, weight loss, dizziness and today he states he fell while walking in his house. No injury from the fall. No syncope. He denies chest pain, SOB, cough, abdominal pain, vomiting. He has IBS/Crohns and has intermittent diarrhea and constipation which hasn't changed. No bloody bowel movements..    Past Medical History  Diagnosis Date  . Dysphagia   . Crohn's disease   . IBS (irritable bowel syndrome)   . Hypertension   . Hyperlipidemia   . Allergic rhinitis   . Paranoid schizophrenia   . Glaucoma   . Peripheral vascular disease   . Dyspnea   . History of pneumonia   . Atelectasis   . COPD (chronic obstructive pulmonary disease)     Past Surgical History  Procedure Date  . Cholecystectomy   . Colon surgery 1996    Family History  Problem Relation Age of Onset  . Hypertension Mother   . Heart disease Mother   . Hypertension Father   . Heart disease Father   . Lung cancer Brother     2006    History  Substance Use Topics  . Smoking status: Current Everyday Smoker -- 1.0 packs/day for 27 years    Types: Cigarettes  . Smokeless tobacco: Never Used  . Alcohol Use: No      Review of Systems  Constitutional: Negative for fever.  Respiratory: Negative for cough and  shortness of breath.   Cardiovascular: Negative for chest pain.  Gastrointestinal: Negative for vomiting, abdominal pain and diarrhea.  Genitourinary: Negative for dysuria.  Neurological: Positive for dizziness and weakness. Negative for loss of consciousness.    Allergies  Codeine; Penicillins; Sulfamethoxazole w-trimethoprim; and Sulfonamide derivatives  Home Medications   Current Outpatient Rx  Name Route Sig Dispense Refill  . ALBUTEROL SULFATE HFA 108 (90 BASE) MCG/ACT IN AERS Inhalation Inhale 2 puffs into the lungs every 6 (six) hours as needed. 1 Inhaler 6  . ALFUZOSIN HCL ER 10 MG PO TB24 Oral Take 10 mg by mouth daily.      . B COMPLEX PO TABS Oral Take 1 tablet by mouth daily.      Marland Kitchen BIMATOPROST 0.03 % OP SOLN  1 drop as needed.      Marland Kitchen BRIMONIDINE TARTRATE 0.15 % OP SOLN Both Eyes Place 1 drop into both eyes 2 (two) times daily.     . CHOLESTYRAMINE 4 G PO PACK Oral Take 1 packet by mouth daily.     . CHOLINE FENOFIBRATE 135 MG PO CPDR Oral Take 135 mg by mouth daily.     Marland Kitchen CLONAZEPAM 1 MG PO TABS Oral Take 1 mg by mouth 2 (two) times daily as needed. Anxiety    . DICYCLOMINE HCL 10 MG PO CAPS Oral Take 10 mg by mouth daily.     . DUTASTERIDE 0.5 MG PO CAPS Oral Take 0.5  mg by mouth daily.      Marland Kitchen ESOMEPRAZOLE MAGNESIUM 20 MG PO CPDR Oral Take 20 mg by mouth daily before breakfast.      . FESOTERODINE FUMARATE ER 4 MG PO TB24 Oral Take 4 mg by mouth daily.    Marland Kitchen LOXAPINE SUCCINATE 10 MG PO CAPS Oral Take 10 mg by mouth 2 (two) times daily.     . MERCAPTOPURINE 50 MG PO TABS Oral Take 50 mg by mouth daily. Give on an empty stomach 1 hour before or 2 hours after meals. Caution: Chemotherapy.     Marland Kitchen MESALAMINE ER 250 MG PO CPCR Oral Take 1,500 mg by mouth 3 (three) times daily.    Marland Kitchen NICOTINE 14 MG/24HR TD PT24 Transdermal Place 1 patch onto the skin daily.    Marland Kitchen ONDANSETRON HCL 4 MG PO TABS Oral Take 4 mg by mouth every 8 (eight) hours as needed.    Marland Kitchen POTASSIUM CHLORIDE 10 MEQ PO  TBCR Oral Take 20 mEq by mouth daily.      Marland Kitchen ROSUVASTATIN CALCIUM 40 MG PO TABS Oral Take 40 mg by mouth daily.    Marland Kitchen TIOTROPIUM BROMIDE MONOHYDRATE 18 MCG IN CAPS  PLACE 1 CAPSULE INTO INHALER AND INHALE EVERY DAY 30 capsule 5  . TRAZODONE HCL 100 MG PO TABS Oral Take 150 mg by mouth at bedtime.     Marland Kitchen VITAMIN D (ERGOCALCIFEROL) 50000 UNITS PO CAPS Oral Take 50,000 Units by mouth every 7 (seven) days.        BP 140/97  Pulse 88  Temp 98.2 F (36.8 C) (Oral)  Resp 20  SpO2 98%  Physical Exam  Constitutional: He is oriented to person, place, and time. He appears well-developed and well-nourished. No distress.  HENT:  Mouth/Throat: Mucous membranes are dry.  Neck: Normal range of motion.  Cardiovascular: Normal rate and regular rhythm.   No murmur heard. Pulmonary/Chest: Effort normal and breath sounds normal. He has no wheezes. He has no rales.  Abdominal: Soft. There is no tenderness. There is no rebound and no guarding.  Musculoskeletal: Normal range of motion. He exhibits no edema.  Neurological: He is alert and oriented to person, place, and time. Coordination normal.       Cranial Nerves 3-12 grossly intact. Lightheaded with standing, ambulation not attempted.    ED Course  Procedures (including critical care time)  Labs Reviewed  CBC - Abnormal; Notable for the following:    HCT 38.4 (*)     All other components within normal limits  COMPREHENSIVE METABOLIC PANEL - Abnormal; Notable for the following:    Creatinine, Ser 2.03 (*)     Alkaline Phosphatase 31 (*)     GFR calc non Af Amer 34 (*)     GFR calc Af Amer 40 (*)     All other components within normal limits  DIFFERENTIAL  URINALYSIS, ROUTINE W REFLEX MICROSCOPIC   Dg Chest Portable 1 View  11/30/2011  *RADIOLOGY REPORT*  Clinical Data: Weakness, shortness of breath and hypertension.  PORTABLE CHEST - 1 VIEW  Comparison: PA and lateral chest 10/02/2011 and 08/19/2008.  CT chest 03/31/2005.  Findings: Lungs are  clear.  No pneumothorax or pleural fluid. Heart size upper normal.  No focal bony abnormality.  IMPRESSION: No acute finding.  Original Report Authenticated By: Arvid Right. D'ALESSIO, M.D.     1. Dehydration       MDM  The patient is given IV fluids and feels much better. He requests  something to eat and finishes meal. He ambulates with steady gait and denies any symptoms of lightheadedness. Lab studies support dehydration. Will discharge home.         Leotis Shames, PA-C 11/30/11 2031

## 2011-11-30 NOTE — ED Notes (Signed)
Pt states for the past 3 weeks he's felt dizzy, weak, lightheaded

## 2012-01-26 ENCOUNTER — Ambulatory Visit: Payer: Medicare Other | Admitting: Internal Medicine

## 2012-03-12 ENCOUNTER — Ambulatory Visit (INDEPENDENT_AMBULATORY_CARE_PROVIDER_SITE_OTHER): Payer: Medicare Other | Admitting: Internal Medicine

## 2012-03-12 ENCOUNTER — Encounter: Payer: Self-pay | Admitting: Internal Medicine

## 2012-03-12 VITALS — BP 126/84 | HR 90 | Temp 98.0°F | Ht 71.0 in | Wt 189.0 lb

## 2012-03-12 DIAGNOSIS — R053 Chronic cough: Secondary | ICD-10-CM

## 2012-03-12 DIAGNOSIS — Z720 Tobacco use: Secondary | ICD-10-CM

## 2012-03-12 DIAGNOSIS — R059 Cough, unspecified: Secondary | ICD-10-CM

## 2012-03-12 DIAGNOSIS — J449 Chronic obstructive pulmonary disease, unspecified: Secondary | ICD-10-CM

## 2012-03-12 DIAGNOSIS — R05 Cough: Secondary | ICD-10-CM

## 2012-03-12 DIAGNOSIS — Z23 Encounter for immunization: Secondary | ICD-10-CM

## 2012-03-12 DIAGNOSIS — F17200 Nicotine dependence, unspecified, uncomplicated: Secondary | ICD-10-CM

## 2012-03-12 NOTE — Progress Notes (Signed)
Subjective:    Patient ID: Chad Moss, male    DOB: [birth year], 56 y.o.   MRN: [id number]  HPI July 13, 2010: 9 month followup stage 2 COPD. Last seen April 2011. In interim, has completed pulmonary rehab. Has had flu shot. States compliance with spiriva. No new complaints. Feels well. Hardly any dyspnea now. Hardly any cough. Attribtes all improvement to rehab and spiriva. Unable to quantify degree of improvement but he states he is happy with his current quality of life. Denies chest pain, cough, edema, orthopnea, pnd, hemoptysis, edema, syncope.   Patient Instructions:  1) continue spiriva  2) congratulations on finishing exercise program  3) - do daily walks and do the exercies they taught you  4) we will check your blood for alpha 1 a genetic cause of copd - MM 5) return in 9 months or sooner if you are sick  6) i am glad you are doing as well as you can  OV 04/07/11: Here with live in caretaker Katina Degree (she is talking on cell phone to someone else and would not hang up until I told her. She was also eating in room when nurse went in). Asymptomatic per caretaker and per patient. They both state that he is compliant with medications though caretaker has no idea what they are. She is unsure why he is on propranalol and lisinopril thought both deny dyspnea or cough.  Reported to have quit smokng in 1998 but surprinngly caretaker admitted that patient smoked "a little" in June 2012. Apparently he has quit since being reprimanded  OV 09/25/11: Patient comes in today for an acute sick visit.  He is usually followed for COPD, and unfortunately has been smoking since June of last year.  He gives a two-week history of rhinorrhea without purulence, a sore throat, as well as a cough with white mucus.  He does not have chest congestion, nor does he feel his breathing is worse than his usual baseline.  He notes significant postnasal drip.  REC Get chlorpheniramine 44m over the counter, and  take at bedtime and lunch for a week to see if runny nose/cough gets better  Can take robitussin dm over the counter for the cough is needed.  Use saline nasal spray for "nasal soreness", congestion.  You must stop smoking 100% if you want to stay well.  Keep followup apptm with Dr. RChase Caller  OV 11/14/2011 PMD SMaximino Greenland MD  Followup COPD,  Smoking and multifactorial cough  - COPD - reports stability. CAT score is 6 and reflects minimal symptom burden. Overall stable. Compliant with medications; Tough time reconciling meds because his caretaker BKatina Degreeis quite unhelpful. WE had to call pharmacy. Noted he is on non specific beta blocker; [facility]. Unclear why he is getting this med. We have addressed this with caretaker in past but with no success  - SMoking - he is smoking despite ban on smoking at his facility. Knows he needs to quit but unable to. CAretaker knows he smokes as well  - Cough - some mild residual cough despite copd Rx +. He is on ace inhibitor and per caretaker PMD considering stopping this. No asspociated sputum. Cough is stable    Past, Family, Social reviewed: no change since last visit  REC #COPD - it is stable  - please continue your current inahler regimen  - [facility]  in presence of copd  #Cough  - please get Dr Dorothyann Peng to stop your lisinopril because this is making cough worse  - she can do some other medication for high bp  #Smoking  Please workn on quitting smoking  #Followup  - 3 months or sooner if needed   OV 03/12/2012  Followup COPD,  Smoking and multifactorial cough  - COPD - reports stability.  Denies cough and dyspnea. Feels fine. Compliant with spiriva and albuterol prn but using albuterol twice daily out of habit though asymptomatic. Marland Kitchen CAT score is 9 and reflects minimal symptom burden; and all sym,ptoms due to nocturia  (sleep quality being affected because of nocturia) and fatigue Overall stable. Compliant with medications; We noted that he is off propranalool. Careetaker [name] has confirmed this. Instead on serouquel by [facility]  - COugh - resolved. He is off lisinopril and propranolol   - SMoking - he is smoking despite ban on smoking at his facility. Knows he needs to quit but unable to. CAretaker knows he smokes as well. Unable to quit. He is interested in gaining weigght though PMD has advised weight loss. I told him he could gain weight if he quit smoking and he is interested but still says it will be real hard to quit smoking.    CAT COPD Symptom and Quality of Life Score ([employer])  0 ->5 11/14/11 03/12/2012   Never Cough -> Cough all the time 2 0  No phlegm in chest -> Chest is full of phlegm 0 0  No chest tightness -> Chest feels very tight 0 0  No dyspnea for 1 flight stairs/hill -> Very dyspneic for 1 flight of stairs 00 0  No limitations for ADL at home -> Very limited with ADL at home 0 0  Confident leaving home -> Not at all confident leaving home 0 0  Sleep soundly -> Do not sleep soundly because of lung condition 0 4  Lots of Energy -> No energy at all 4 5  TOTAL Score (max 40)  6 9    Current outpatient prescriptions:albuterol (PROVENTIL HFA;VENTOLIN HFA) 108 (90 BASE) MCG/ACT inhaler, Inhale 2 puffs into the lungs every 6 (six) hours as needed., Disp: 1 Inhaler, Rfl: 6;  alfuzosin (UROXATRAL) 10 MG 24 hr tablet, Take 10 mg by mouth daily.  , Disp: , Rfl: ;  b complex vitamins tablet, Take 1 tablet by mouth daily.  , Disp: , Rfl: ;  bimatoprost (LUMIGAN) 0.03 % ophthalmic solution, 1 drop as needed.  , Disp: , Rfl:  brimonidine (ALPHAGAN) 0.15 % ophthalmic solution, Place 1 drop into both eyes 2 (two) times daily. , Disp: , Rfl: ;  cholestyramine (QUESTRAN) 4 G packet, Take 1 packet by mouth daily. , Disp: , Rfl: ;  Choline Fenofibrate (TRILIPIX)  135 MG capsule, Take 135 mg by mouth daily. , Disp: , Rfl: ;  clonazePAM (KLONOPIN) 1 MG tablet, Take 1 mg by mouth 2 (two) times daily as needed. Anxiety, Disp: , Rfl:  dicyclomine (BENTYL) 10 MG capsule, Take 10 mg by mouth daily. , Disp: , Rfl: ;  dutasteride (AVODART) 0.5 MG capsule, Take 0.5 mg by mouth daily.  , Disp: , Rfl: ;  esomeprazole (NEXIUM) 20 MG capsule, Take 20 mg by mouth daily before breakfast.  , Disp: , Rfl: ;  fesoterodine (TOVIAZ) 4 MG TB24, Take 4 mg by mouth daily., Disp: , Rfl: ;  loxapine (LOXITANE) 10 MG  capsule, Take 10 mg by mouth 2 (two) times daily. , Disp: , Rfl:  mercaptopurine (PURINETHOL) 50 MG tablet, Take 75 mg by mouth daily. Give on an empty stomach 1 hour before or 2 hours after meals. Caution: Chemotherapy., Disp: , Rfl: ;  mesalamine (PENTASA) 250 MG CR capsule, Take 1,500 mg by mouth 3 (three) times daily., Disp: , Rfl: ;  mirabegron ER (MYRBETRIQ) 25 MG TB24, Take 25 mg by mouth daily., Disp: , Rfl:  nicotine (NICODERM CQ - DOSED IN MG/24 HOURS) 14 mg/24hr patch, Place 1 patch onto the skin daily., Disp: , Rfl: ;  ondansetron (ZOFRAN) 4 MG tablet, Take 4 mg by mouth every 8 (eight) hours as needed., Disp: , Rfl: ;  potassium chloride (KLOR-CON) 10 MEQ CR tablet, Take 20 mEq by mouth daily.  , Disp: , Rfl: ;  QUEtiapine (SEROQUEL) 100 MG tablet, Take 100 mg by mouth at bedtime., Disp: , Rfl:  rosuvastatin (CRESTOR) 40 MG tablet, Take 40 mg by mouth daily., Disp: , Rfl: ;  tiotropium (SPIRIVA HANDIHALER) 18 MCG inhalation capsule, PLACE 1 CAPSULE INTO INHALER AND INHALE EVERY DAY, Disp: 30 capsule, Rfl: 5;  traZODone (DESYREL) 100 MG tablet, Take 150 mg by mouth at bedtime. , Disp: , Rfl: ;  Vitamin D, Ergocalciferol, (DRISDOL) 50000 UNITS CAPS, Take 50,000 Units by mouth every 7 (seven) days.  , Disp: , Rfl:    Review of Systems  Constitutional: Negative for fever and unexpected weight change.  HENT: Negative for ear pain, nosebleeds, congestion, sore throat,  rhinorrhea, sneezing, trouble swallowing, dental problem, postnasal drip and sinus pressure.   Eyes: Negative for redness and itching.  Respiratory: Negative for cough, chest tightness, shortness of breath and wheezing.   Cardiovascular: Negative for palpitations and leg swelling.  Gastrointestinal: Negative for nausea and vomiting.  Genitourinary: Negative for dysuria.  Musculoskeletal: Negative for joint swelling.  Skin: Negative for rash.  Neurological: Negative for headaches.  Hematological: Does not bruise/bleed easily.  Psychiatric/Behavioral: Negative for dysphoric mood. The patient is not nervous/anxious.        Objective:   Physical Exam Nursing note and vitals reviewed. Constitutional: He is oriented to person, place, and time. He appears well-developed and well-nourished. No distress.  HENT:  Head: Normocephalic and atraumatic.  Right Ear: External ear normal.  Left Ear: External ear normal.  Mouth/Throat: Oropharynx is clear and moist. No oropharyngeal exudate.  Eyes: Conjunctivae and EOM are normal. Pupils are equal, round, and reactive to light. Right eye exhibits no discharge. Left eye exhibits no discharge. No scleral icterus.  Neck: Normal range of motion. Neck supple. No JVD present. No tracheal deviation present. No thyromegaly present.  Cardiovascular: Normal rate, regular rhythm and intact distal pulses.  Exam reveals no gallop and no friction rub.   No murmur heard. Pulmonary/Chest: Effort normal and breath sounds normal. No respiratory distress. He has no wheezes. He has no rales. He exhibits no tenderness.  Abdominal: Soft. Bowel sounds are normal. He exhibits no distension and no mass. There is no tenderness. There is no rebound and no guarding.  Musculoskeletal: Normal range of motion. He exhibits no edema and no tenderness.  Lymphadenopathy:    He has no cervical adenopathy.  Neurological: He is alert and oriented to person, place, and time. He has normal  reflexes. No cranial nerve deficit. Coordination normal.  Skin: Skin is warm and dry. No rash noted. He is not diaphoretic. No erythema. No pallor.  Psychiatric: He has a normal mood  and affect. His behavior is normal. Judgment and thought content normal.           Assessment & Plan:

## 2012-03-12 NOTE — Assessment & Plan Note (Signed)
#  Smoking   Please workn on quitting smoking

## 2012-03-12 NOTE — Patient Instructions (Addendum)
#  COPD - it is stable   - please continue your current inahler regimen but take spiriva only once  A day and albuterol as needed  - glad you are off propranalol and lisinopril - have flu shot 03/12/2012 - referring you to rehab so that your fatigue associated with copd will improve  #Cough - glad is resolved after you stopped the bp medications  #Smoking   Please workn on quitting smoking  #Followup  - 6 months or sooner if needed

## 2012-03-12 NOTE — Assessment & Plan Note (Signed)
#  Cough - glad is resolved after you stopped the bp medications

## 2012-03-12 NOTE — Assessment & Plan Note (Signed)
#  COPD - it is stable   - please continue your current inahler regimen but take spiriva only once  A day and albuterol as needed  - glad you are off propranalol and lisinopril - have flu shot 03/12/2012 - referring you to rehab so that your fatigue associated with copd will improve #Followup  - 6 months or sooner if needed

## 2012-05-15 ENCOUNTER — Other Ambulatory Visit: Payer: Self-pay | Admitting: Internal Medicine

## 2012-05-15 ENCOUNTER — Telehealth (HOSPITAL_COMMUNITY): Payer: Self-pay | Admitting: *Deleted

## 2012-05-15 NOTE — Telephone Encounter (Signed)
Telephone call to Chad Moss, answered by another household member that requested we call back after the holiday.  Letter sent to Chad Moss requesting he call us when he is ready to schedule his Pulmonary Rehab.   Cathie Olden RN

## 2012-08-06 ENCOUNTER — Ambulatory Visit (INDEPENDENT_AMBULATORY_CARE_PROVIDER_SITE_OTHER): Payer: Medicare Other | Admitting: Adult Health

## 2012-08-06 ENCOUNTER — Encounter: Payer: Self-pay | Admitting: Adult Health

## 2012-08-06 VITALS — BP 118/70 | HR 90 | Temp 97.2°F | Ht 71.0 in | Wt 194.0 lb

## 2012-08-06 DIAGNOSIS — J449 Chronic obstructive pulmonary disease, unspecified: Secondary | ICD-10-CM

## 2012-08-06 MED ORDER — HYDROCODONE-HOMATROPINE 5-1.5 MG/5ML PO SYRP: 5.0000 mL | ORAL_SOLUTION | Freq: Four times a day (QID) | ORAL | Status: DC | PRN

## 2012-08-06 MED ORDER — DOXYCYCLINE HYCLATE 100 MG PO TABS: 100.0000 mg | ORAL_TABLET | Freq: Two times a day (BID) | ORAL | Status: AC

## 2012-08-06 MED ORDER — LEVALBUTEROL HCL 0.63 MG/3ML IN NEBU
0.6300 mg | INHALATION_SOLUTION | Freq: Once | RESPIRATORY_TRACT | Status: AC
  Administered 2012-08-06: 0.63 mg via RESPIRATORY_TRACT

## 2012-08-06 NOTE — Patient Instructions (Addendum)
Doxycycline 100mg  Twice daily  For 7 days -take w/ food  Mucinex DM Twice daily  As needed  Cough/congestion .  Claritin 10mg  At bedtime  As needed  Drainage  Fluids and rest  Hydromet 1-2 tsp every 4-6 hr As needed  Cough , may make you sleepy  Please contact office for sooner follow up if symptoms do not improve or worsen or seek emergency care  Follow up Dr. Marchelle Gearing in 6 weeks and As needed

## 2012-08-06 NOTE — Assessment & Plan Note (Signed)
Flare   xopenex neb x 1 in office  Wants a cough syrup, says he can take hydrocodone does not have an allergy despite codeine allergy report.  Plan  Doxycycline 100mg  Twice daily  For 7 days -take w/ food  Mucinex DM Twice daily  As needed  Cough/congestion .  Claritin 10mg  At bedtime  As needed  Drainage  Fluids and rest  Hydromet 1-2 tsp every 4-6 hr As needed  Cough , may make you sleepy  Please contact office for sooner follow up if symptoms do not improve or worsen or seek emergency care  Follow up Dr. Marchelle Gearing in 6 weeks and As needed

## 2012-08-06 NOTE — Addendum Note (Signed)
Addended by: Charlott Holler on: 08/06/2012 12:22 PM   Modules accepted: Orders

## 2012-08-06 NOTE — Progress Notes (Signed)
Subjective:    Patient ID: Chad Moss, male    DOB: [birth year], 57 y.o.   MRN: [id number]  HPI July 13, 2010: 9 month followup stage 2 COPD. Last seen April 2011. In interim, has completed pulmonary rehab. Has had flu shot. States compliance with spiriva. No new complaints. Feels well. Hardly any dyspnea now. Hardly any cough. Attribtes all improvement to rehab and spiriva. Unable to quantify degree of improvement but he states he is happy with his current quality of life. Denies chest pain, cough, edema, orthopnea, pnd, hemoptysis, edema, syncope.   Patient Instructions:  1) continue spiriva  2) congratulations on finishing exercise program  3) - do daily walks and do the exercies they taught you  4) we will check your blood for alpha 1 a genetic cause of copd - MM 5) return in 9 months or sooner if you are sick  6) i am glad you are doing as well as you can  OV 04/07/11: Here with live in caretaker Katina Degree (she is talking on cell phone to someone else and would not hang up until I told her. She was also eating in room when nurse went in). Asymptomatic per caretaker and per patient. They both state that he is compliant with medications though caretaker has no idea what they are. She is unsure why he is on propranalol and lisinopril thought both deny dyspnea or cough.  Reported to have quit smokng in 1998 but surprinngly caretaker admitted that patient smoked "a little" in June 2012. Apparently he has quit since being reprimanded  OV 09/25/11: Patient comes in today for an acute sick visit.  He is usually followed for COPD, and unfortunately has been smoking since June of last year.  He gives a two-week history of rhinorrhea without purulence, a sore throat, as well as a cough with white mucus.  He does not have chest congestion, nor does he feel his breathing is worse than his usual baseline.  He notes significant postnasal drip.  REC Get chlorpheniramine 44m over the counter, and  take at bedtime and lunch for a week to see if runny nose/cough gets better  Can take robitussin dm over the counter for the cough is needed.  Use saline nasal spray for "nasal soreness", congestion.  You must stop smoking 100% if you want to stay well.  Keep followup apptm with Dr. RChase Caller  OV 11/14/2011 PMD SMaximino Greenland MD  Followup COPD,  Smoking and multifactorial cough  - COPD - reports stability. CAT score is 6 and reflects minimal symptom burden. Overall stable. Compliant with medications; Tough time reconciling meds because his caretaker BKatina Degreeis quite unhelpful. WE had to call pharmacy. Noted he is on non specific beta blocker; [facility] Unclear why he is getting this med. We have addressed this with caretaker in past but with no success  - SMoking - he is smoking despite ban on smoking at his facility. Knows he needs to quit but unable to. CAretaker knows he smokes as well  - Cough - some mild residual cough despite copd Rx +. He is on ace inhibitor and per caretaker PMD considering stopping this. No asspociated sputum. Cough is stable    Past, Family, Social reviewed: no change since last visit  REC #COPD - it is stable  - please continue your current inahler regimen  - [facility]  in presence of copd  #Cough  - please get Dr Dorothyann Peng to stop your lisinopril because this is making cough worse  - she can do some other medication for high bp  #Smoking  Please workn on quitting smoking  #Followup  - 3 months or sooner if needed   OV 03/12/2012  Followup COPD,  Smoking and multifactorial cough  - COPD - reports stability.  Denies cough and dyspnea. Feels fine. Compliant with spiriva and albuterol prn but using albuterol twice daily out of habit though asymptomatic. Marland Kitchen CAT score is 9 and reflects minimal symptom burden; and all sym,ptoms due to nocturia  (sleep quality being affected because of nocturia) and fatigue Overall stable. Compliant with medications; We noted that he is off propranalool. Careetaker [name] has confirmed this. Instead on serouquel [facility]dis [facility]  - COugh - resolved. He is off lisinopril and propranolol   - SMoking - he is smoking despite ban on smoking at his facility. Knows he needs to quit but unable to. CAretaker knows he smokes as well. Unable to quit. He is interested in gaining weigght though PMD has advised weight loss. I told him he could gain weight if he quit smoking and he is interested but still says it will be real hard to quit smoking.    CAT COPD Symptom and Quality of Life Score (glaxo smith kline trademark)  0 ->5 11/14/11 03/12/2012   Never Cough -> Cough all the time 2 0  No phlegm in chest -> Chest is full of phlegm 0 0  No chest tightness -> Chest feels very tight 0 0  No dyspnea for 1 flight stairs/hill -> Very dyspneic for 1 flight of stairs 00 0  No limitations for ADL at home -> Very limited with ADL at home 0 0  Confident leaving home -> Not at all confident leaving home 0 0  Sleep soundly -> Do not sleep soundly because of lung condition 0 4  Lots of Energy -> No energy at all 4 5  TOTAL Score (max 40)  6 9     08/06/2012 Acute OV  Complains of nasal congestion,dry cough,soreness on left side of chest x 1 wk.,no fcs,no sob, no wheezing,nose yellow and green discharge OTC mucinex not helpign  Cough is keeping him up at night  No fever, recent abx or travel.  No chest pain , hemoptysis or weight loss.    Review of Systems  Constitutional:   No  weight loss, night sweats,  Fevers, chills, fatigue, or  lassitude.  HEENT:   No headaches,  Difficulty swallowing,  Tooth/dental problems, or  Sore throat,                No sneezing, itching, ear ache,  +nasal congestion, post nasal drip,   CV:  No chest pain,  Orthopnea, PND, swelling in lower extremities, anasarca,  dizziness, palpitations, syncope.   GI  No heartburn, indigestion, abdominal pain, nausea, vomiting, diarrhea, change in bowel habits, loss of appetite, bloody stools.   Resp:  No coughing up of blood.  No change in color of mucus.  No wheezing.  No chest wall deformity  Skin: no rash or lesions.  GU: no dysuria, change in color of urine, no urgency or frequency.  No flank pain, no hematuria   MS:  No joint pain or swelling.  No decreased range of motion.  No back pain.  Psych:  No change in mood or affect.  No depression or anxiety.  No memory loss.          Objective:   Physical Exam GEN: A/Ox3; pleasant , NAD, well nourished   HEENT:  [place]/AT,  EACs-clear, TMs-wnl, NOSE-clear, THROAT-clear, no lesions, + postnasal drip or exudate noted.   NECK:  Supple w/ fair ROM; no JVD; normal carotid impulses w/o bruits; no thyromegaly or nodules palpated; no lymphadenopathy.  RESP  Few rhonchi , no accessory muscle use, no dullness to percussion  CARD:  RRR, no m/r/g  , no peripheral edema, pulses intact, no cyanosis or clubbing.  GI:   Soft & nt; nml bowel sounds; no organomegaly or masses detected.  Musco: Warm bil, no deformities or joint swelling noted.   Neuro: alert, no focal deficits noted.    Skin: Warm, no lesions or rashes        Assessment & Plan:

## 2012-10-07 ENCOUNTER — Encounter: Payer: Self-pay | Admitting: Internal Medicine

## 2012-10-07 ENCOUNTER — Other Ambulatory Visit: Payer: Self-pay | Admitting: Internal Medicine

## 2012-10-07 ENCOUNTER — Ambulatory Visit (INDEPENDENT_AMBULATORY_CARE_PROVIDER_SITE_OTHER): Payer: Medicare Other | Admitting: Internal Medicine

## 2012-10-07 VITALS — BP 110/60 | HR 81 | Temp 97.7°F | Ht 71.0 in | Wt 189.6 lb

## 2012-10-07 DIAGNOSIS — Z720 Tobacco use: Secondary | ICD-10-CM

## 2012-10-07 DIAGNOSIS — J449 Chronic obstructive pulmonary disease, unspecified: Secondary | ICD-10-CM

## 2012-10-07 DIAGNOSIS — J4489 Other specified chronic obstructive pulmonary disease: Secondary | ICD-10-CM

## 2012-10-07 DIAGNOSIS — F17200 Nicotine dependence, unspecified, uncomplicated: Secondary | ICD-10-CM

## 2012-10-07 DIAGNOSIS — Z129 Encounter for screening for malignant neoplasm, site unspecified: Secondary | ICD-10-CM

## 2012-10-07 NOTE — Progress Notes (Signed)
Subjective:    Patient ID: Chad Moss, male    DOB: [birth year], 57 y.o.   MRN: [id number]  HPI July 13, 2010: 9 month followup stage 2 COPD. Last seen April 2011. In interim, has completed pulmonary rehab. Has had flu shot. States compliance with spiriva. No new complaints. Feels well. Hardly any dyspnea now. Hardly any cough. Attribtes all improvement to rehab and spiriva. Unable to quantify degree of improvement but he states he is happy with his current quality of life. Denies chest pain, cough, edema, orthopnea, pnd, hemoptysis, edema, syncope.   Patient Instructions:  1) continue spiriva  2) congratulations on finishing exercise program  3) - do daily walks and do the exercies they taught you  4) we will check your blood for alpha 1 a genetic cause of copd - MM 5) return in 9 months or sooner if you are sick  6) i am glad you are doing as well as you can  OV 04/07/11: Here with live in caretaker Katina Degree (she is talking on cell phone to someone else and would not hang up until I told her. She was also eating in room when nurse went in). Asymptomatic per caretaker and per patient. They both state that he is compliant with medications though caretaker has no idea what they are. She is unsure why he is on propranalol and lisinopril thought both deny dyspnea or cough.  Reported to have quit smokng in 1998 but surprinngly caretaker admitted that patient smoked "a little" in June 2012. Apparently he has quit since being reprimanded  OV 09/25/11: Patient comes in today for an acute sick visit.  He is usually followed for COPD, and unfortunately has been smoking since June of last year.  He gives a two-week history of rhinorrhea without purulence, a sore throat, as well as a cough with white mucus.  He does not have chest congestion, nor does he feel his breathing is worse than his usual baseline.  He notes significant postnasal drip.  REC Get chlorpheniramine 44m over the counter, and  take at bedtime and lunch for a week to see if runny nose/cough gets better  Can take robitussin dm over the counter for the cough is needed.  Use saline nasal spray for "nasal soreness", congestion.  You must stop smoking 100% if you want to stay well.  Keep followup apptm with Dr. RChase Caller  OV 11/14/2011 PMD SMaximino Greenland MD  Followup COPD,  Smoking and multifactorial cough  - COPD - reports stability. CAT score is 6 and reflects minimal symptom burden. Overall stable. Compliant with medications; Tough time reconciling meds because his caretaker BKatina Degreeis quite unhelpful. WE had to call pharmacy. Noted he is on non specific beta blocker; [facility]. Unclear why he is getting this med. We have addressed this with caretaker in past but with no success  - SMoking - he is smoking despite ban on smoking at his facility. Knows he needs to quit but unable to. CAretaker knows he smokes as well  - Cough - some mild residual cough despite copd Rx +. He is on ace inhibitor and per caretaker PMD considering stopping this. No asspociated sputum. Cough is stable    Past, Family, Social reviewed: no change since last visit  REC #COPD - it is stable  - please continue your current inahler regimen  - [facility]  in presence of copd  #Cough  - please get Dr Glendale Chard to stop your lisinopril because this is making cough worse  - she can do some other medication for high bp  #Smoking  Please workn on quitting smoking  #Followup  - 3 months or sooner if needed   OV 03/12/2012  Followup COPD,  Smoking and multifactorial cough  - COPD - reports stability.  Denies cough and dyspnea. Feels fine. Compliant with spiriva and albuterol prn but using albuterol twice daily out of habit though asymptomatic. Marland Kitchen CAT score is 9 and reflects minimal symptom burden; and all sym,ptoms due to nocturia  (sleep quality being affected because of nocturia) and fatigue Overall stable. Compliant with medications; We noted that he is off propranalool. Careetaker Katina Degree has confirmed this. Instead on serouquel [facility] Sers [facility]  - COugh - resolved. He is off lisinopril and propranolol   - SMoking - he is smoking despite ban on smoking at his facility. Knows he needs to quit but unable to. CAretaker knows he smokes as well. Unable to quit. He is interested in gaining weigght though PMD has advised weight loss. I told him he could gain weight if he quit smoking and he is interested but still says it will be real hard to quit smoking.      08/06/2012 Acute OV  Complains of nasal congestion,dry cough,soreness on left side of chest x 1 wk.,no fcs,no sob, no wheezing,nose yellow and green discharge OTC mucinex not helpign  Cough is keeping him up at night  No fever, recent abx or travel.  No chest pain , hemoptysis or weight loss.    Doxycycline 133m Twice daily For 7 days -take w/ food  Mucinex DM Twice daily As needed Cough/congestion .  Claritin 132mAt bedtime As needed Drainage  Fluids and rest  Hydromet 1-2 tsp every 4-6 hr As needed Cough , may make you sleepy  Please contact office for sooner follow up if symptoms do not improve or worsen or seek emergency care  Follow up Dr. RaChase Callern 6 weeks and As needed     OV 10/07/2012. Followup COPD and smoking  - Overall reports stability in shortness of breath and cough. Only minimal symptoms. He says he is doing well. He had an exacerbation that was treated in the office in February 2014 but currently he is over it. He is compliant with Spiriva. His caretaker is not with him today  - Smoking he continues to smoke 3 cigarettes a day. He knows that he needs to quit and he wants help. He has anxiety and depression and is taking anxiolytics, antidepressants and antiparkinsonian drugs and anticholinergics [basically a lot of  medications on the beer list]. He says nicotine patch does not work. He denies any suicidal ideations or frank depressive symptoms. He is under the care of a psychiatrist  - Lung cancer screening: We discussed this and he cannot output $300 out of pocket  CAT COPD Symptom and Quality of Life Score (glaxo smith kline trademark)  0 ->5 11/14/11 03/12/2012   Never Cough -> Cough all the time 2 0  No phlegm in chest -> Chest is full of phlegm 0 0  No chest tightness -> Chest feels very tight 0 0  No dyspnea for 1 flight stairs/hill -> Very dyspneic for 1 flight of stairs 00 0  No limitations for ADL at home -> Very limited with ADL at home 0 0  Confident  leaving home -> Not at all confident leaving home 0 0  Sleep soundly -> Do not sleep soundly because of lung condition 0 4  Lots of Energy -> No energy at all 4 5  TOTAL Score (max 40)  6 9      Current outpatient prescriptions:albuterol (PROVENTIL HFA;VENTOLIN HFA) 108 (90 BASE) MCG/ACT inhaler, Inhale 2 puffs into the lungs every 6 (six) hours as needed., Disp: 1 Inhaler, Rfl: 6;  alfuzosin (UROXATRAL) 10 MG 24 hr tablet, Take 10 mg by mouth daily.  , Disp: , Rfl: ;  b complex vitamins tablet, Take 1 tablet by mouth daily.  , Disp: , Rfl: ;  bimatoprost (LUMIGAN) 0.03 % ophthalmic solution, 1 drop as needed.  , Disp: , Rfl:  brimonidine (ALPHAGAN) 0.15 % ophthalmic solution, Place 1 drop into both eyes 2 (two) times daily. , Disp: , Rfl: ;  cholestyramine (QUESTRAN) 4 G packet, Take 1 packet by mouth daily. , Disp: , Rfl: ;  Choline Fenofibrate (TRILIPIX) 135 MG capsule, Take 135 mg by mouth daily. , Disp: , Rfl: ;  clonazePAM (KLONOPIN) 1 MG tablet, Take 1 mg by mouth 2 (two) times daily as needed. Anxiety, Disp: , Rfl:  dicyclomine (BENTYL) 10 MG capsule, Take 10 mg by mouth daily. , Disp: , Rfl: ;  dorzolamide-timolol (COSOPT) 22.3-6.8 MG/ML ophthalmic solution, Place 1 drop into the left eye 2 (two) times daily., Disp: , Rfl: ;  dutasteride  (AVODART) 0.5 MG capsule, Take 0.5 mg by mouth daily.  , Disp: , Rfl: ;  esomeprazole (NEXIUM) 20 MG capsule, Take 20 mg by mouth daily before breakfast.  , Disp: , Rfl:  fesoterodine (TOVIAZ) 4 MG TB24, Take 4 mg by mouth daily., Disp: , Rfl: ;  loxapine (LOXITANE) 10 MG capsule, Take 10 mg by mouth 2 (two) times daily. , Disp: , Rfl: ;  mercaptopurine (PURINETHOL) 50 MG tablet, Take 75 mg by mouth daily. Give on an empty stomach 1 hour before or 2 hours after meals. Caution: Chemotherapy., Disp: , Rfl: ;  mesalamine (PENTASA) 250 MG CR capsule, Take 1,500 mg by mouth 3 (three) times daily., Disp: , Rfl:  mirabegron ER (MYRBETRIQ) 25 MG TB24, Take 25 mg by mouth daily., Disp: , Rfl: ;  nicotine (NICODERM CQ - DOSED IN MG/24 HOURS) 14 mg/24hr patch, Place 1 patch onto the skin daily., Disp: , Rfl: ;  ondansetron (ZOFRAN) 4 MG tablet, Take 4 mg by mouth every 8 (eight) hours as needed., Disp: , Rfl: ;  potassium chloride (KLOR-CON) 10 MEQ CR tablet, Take 20 mEq by mouth daily.  , Disp: , Rfl:  Pseudoephedrine-APAP-DM (DAYQUIL PO), Take by mouth. As needed, Disp: , Rfl: ;  QUEtiapine (SEROQUEL) 100 MG tablet, Take 100 mg by mouth at bedtime., Disp: , Rfl: ;  rosuvastatin (CRESTOR) 40 MG tablet, Take 40 mg by mouth daily., Disp: , Rfl: ;  SPIRIVA HANDIHALER 18 MCG inhalation capsule, PLACE 1 CAPSULE INTO INHALER AND INHALE EVERY DAY, Disp: 30 capsule, Rfl: 4 traZODone (DESYREL) 100 MG tablet, Take 150 mg by mouth at bedtime. , Disp: , Rfl: ;  Vitamin D, Ergocalciferol, (DRISDOL) 50000 UNITS CAPS, Take 50,000 Units by mouth every 7 (seven) days.  , Disp: , Rfl:    Review of Systems  Constitutional: Negative for fever and unexpected weight change.  HENT: Negative for ear pain, nosebleeds, congestion, sore throat, rhinorrhea, sneezing, trouble swallowing, dental problem, postnasal drip and sinus pressure.   Eyes: Negative for redness and  itching.  Respiratory: Negative for cough, chest tightness, shortness of  breath and wheezing.   Cardiovascular: Negative for palpitations and leg swelling.  Gastrointestinal: Negative for nausea and vomiting.  Genitourinary: Negative for dysuria.  Musculoskeletal: Negative for joint swelling.  Skin: Negative for rash.  Neurological: Negative for headaches.  Hematological: Does not bruise/bleed easily.  Psychiatric/Behavioral: Negative for dysphoric mood. The patient is not nervous/anxious.        Objective:   Physical Exam GEN: A/Ox3; pleasant , NAD, well nourished   HEENT:  [place]/AT,  EACs-clear, TMs-wnl, NOSE-clear, THROAT-clear, no lesions,   NECK:  Supple w/ fair ROM; no JVD; normal carotid impulses w/o bruits; no thyromegaly or nodules palpated; no lymphadenopathy.  RESP  Few rhonchi , no accessory muscle use, no dullness to percussion  CARD:  RRR, no m/r/g  , no peripheral edema, pulses intact, no cyanosis or clubbing.  GI:   Soft & nt; nml bowel sounds; no organomegaly or masses detected.  Musco: Warm bil, no deformities or joint swelling noted.   Neuro: alert, no focal deficits noted.    Skin: Warm, no lesions or rashes          Assessment & Plan:

## 2012-10-07 NOTE — Patient Instructions (Addendum)
#  smoking STart wellbutrin or zyban only other direction of psychiatrist because this is high risk for me to prescribe given your history of depression and anxiety   #COPD - Continue daily Spiriva - Have flu shot in the fall  #Lung cancer screening - If you are interested in this, it will cost you $300 out of pocket  #Followup - 2 months to review quit smoking and COPD

## 2012-10-09 DIAGNOSIS — Z122 Encounter for screening for malignant neoplasm of respiratory organs: Secondary | ICD-10-CM

## 2012-10-09 HISTORY — DX: Encounter for screening for malignant neoplasm of respiratory organs: Z12.2

## 2012-10-09 NOTE — Assessment & Plan Note (Signed)
He continues to smoke. We discussed this. His significant psychiatry issues and therefore I'm hesitant to prescribe either Zyban or Chantix. I have referred him to talk to the psychiatrist  3 minutes face-to-face counseling

## 2012-10-09 NOTE — Assessment & Plan Note (Signed)
  #  COPD  - Stable disease - Continue daily Spiriva - Have flu shot in the fal

## 2012-10-09 NOTE — Assessment & Plan Note (Signed)
#  lung cancer screening I discussed screening Ct chest for early detection of lung cancer Explained that in age 59-75 and smoking history, annual low dose CT chest can pick up lung cancer early and has potential to save lives and cure lung cancer This is similar in concept to screening mammogram, colonoscopies and pap smears I explained Ct scan is low dose radiation I explained early lung cancer asymptomatic and only way to  detect is CT  With the real advantage that early lung cancer is curable through radiation or surgery I explained CT superior to CXR I explained that false positives are present and can incur cost and workup like biopsies, additional scan but benefit outweighs risk I explained currently out of pocket $300 I recommend one a year for a minimum of 3 years   At this point in time he cannot offer $300 so he is not going to do it

## 2012-11-01 ENCOUNTER — Other Ambulatory Visit (INDEPENDENT_AMBULATORY_CARE_PROVIDER_SITE_OTHER): Payer: Self-pay | Admitting: Otolaryngology

## 2012-11-01 DIAGNOSIS — R1310 Dysphagia, unspecified: Secondary | ICD-10-CM

## 2012-11-05 ENCOUNTER — Ambulatory Visit (HOSPITAL_COMMUNITY)
Admission: RE | Admit: 2012-11-05 | Discharge: 2012-11-05 | Disposition: A | Discharge status: Home / Self Care | Payer: Medicare Other | Source: Ambulatory Visit | Attending: Otolaryngology | Admitting: Otolaryngology

## 2012-11-05 DIAGNOSIS — R1310 Dysphagia, unspecified: Secondary | ICD-10-CM | POA: Insufficient documentation

## 2012-11-05 IMAGING — RF DG ESOPHAGUS
8 series · 14 of 24 positions shown · non-contrast
Comparison: None.

CLINICAL DATA: History of dysphagia.

ESOPHOGRAM / BARIUM SWALLOW / BARIUM TABLET STUDY
TECHNIQUE: Combined double contrast and single contrast
examination performed using effervescent crystals, thick barium
liquid, and thin barium liquid.  The patient was observed with
fluoroscopy swallowing a 13mm barium sulphate tablet.
Fluoroscopy time:  1 minute 3 seconds.

[Series 1: run · 3 of 23 slices shown (1 of 8)]
[im 1/23]
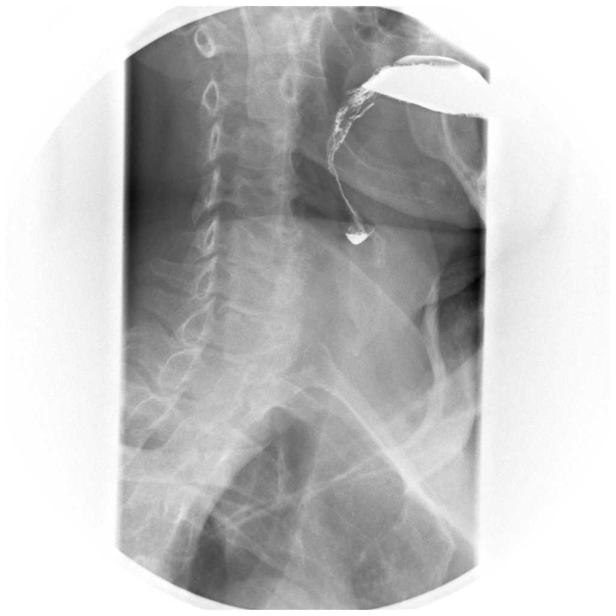
[im 9/23]
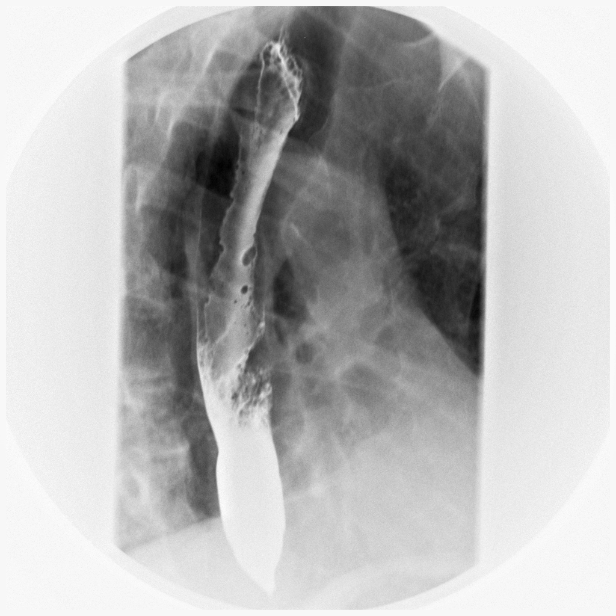
[im 18/23]
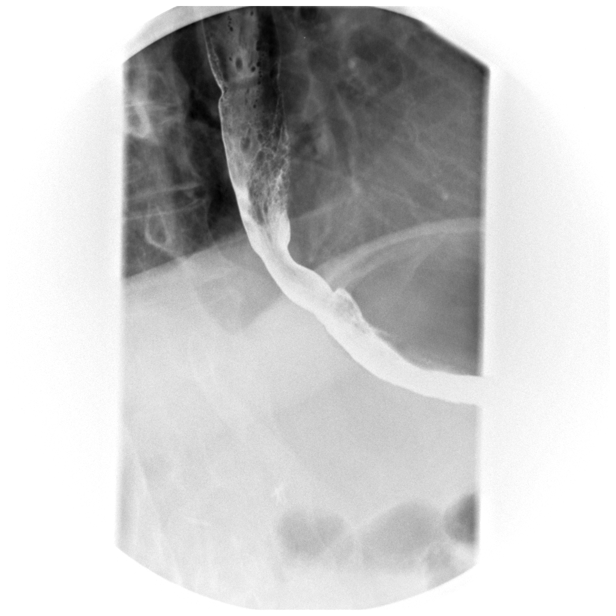

[Series 2: run · 2 of 12 slices shown (2 of 8)]
[im 1/12]
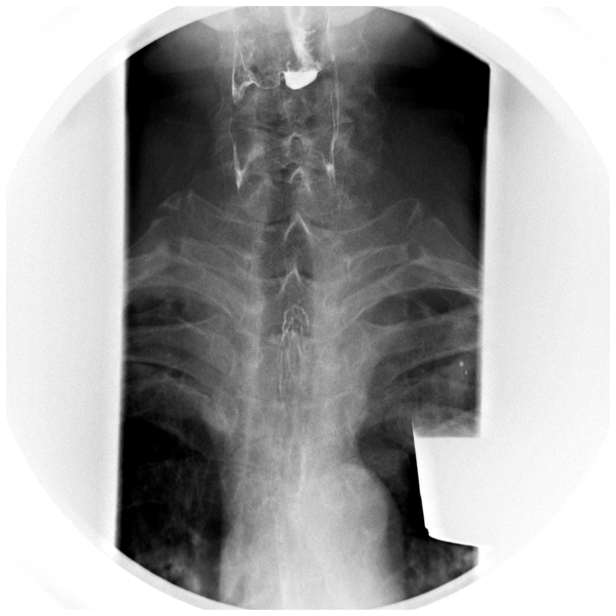
[im 6/12]
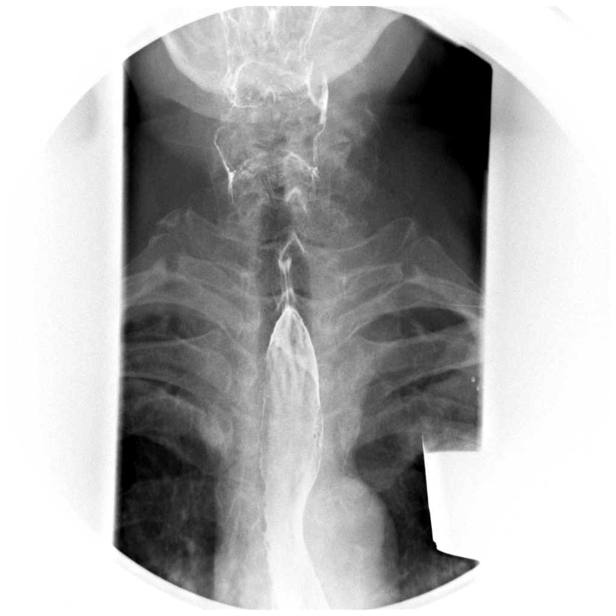

[Series 3: run · 2 of 12 slices shown (3 of 8)]
[im 1/12]
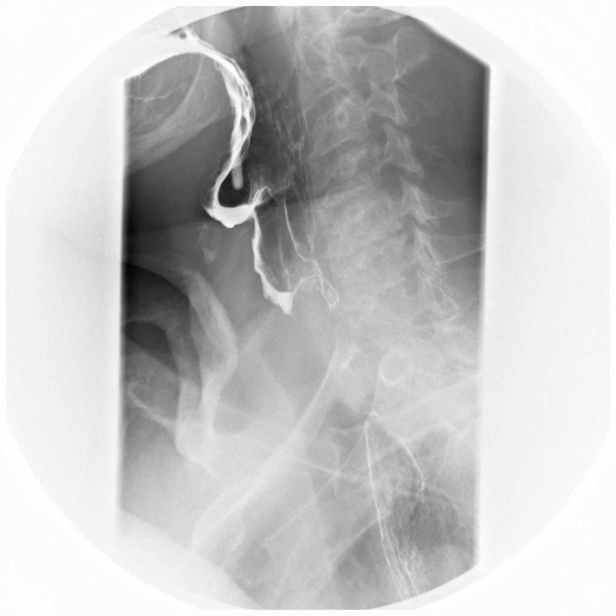
[im 12/12]
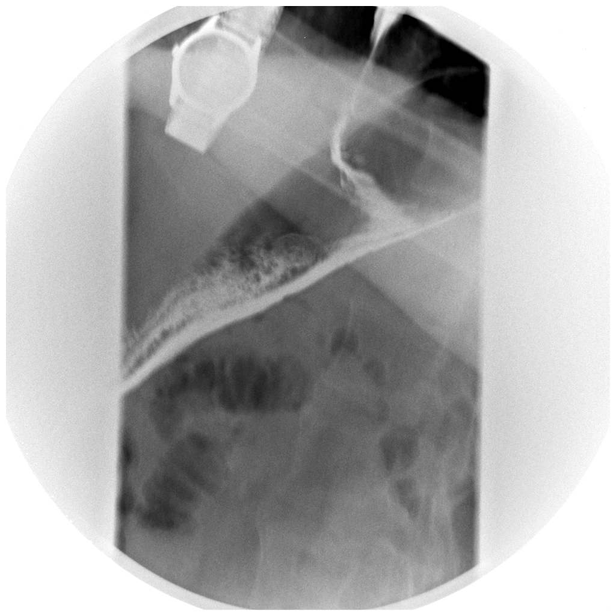

[Series 4: run · 1 of 1 slices shown (4 of 8)]
[im 1/1]
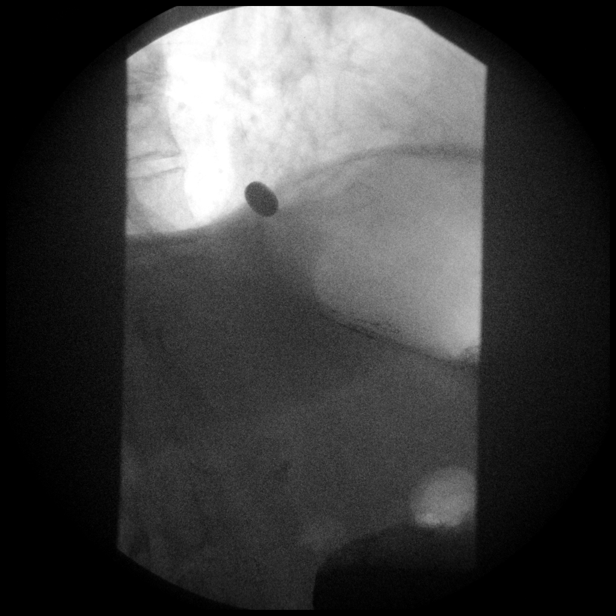

[Series 5: run · 1 of 12 slices shown (5 of 8)]
[im 6/12]
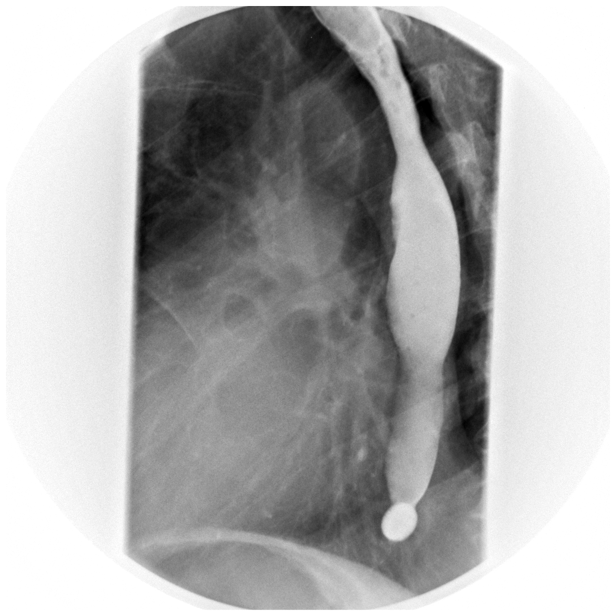

[Series 6: run · 2 of 10 slices shown (6 of 8)]
[im 1/10]
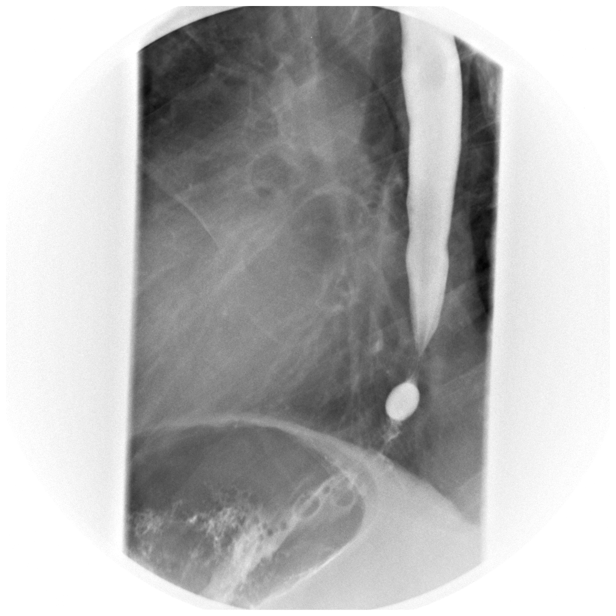
[im 10/10]
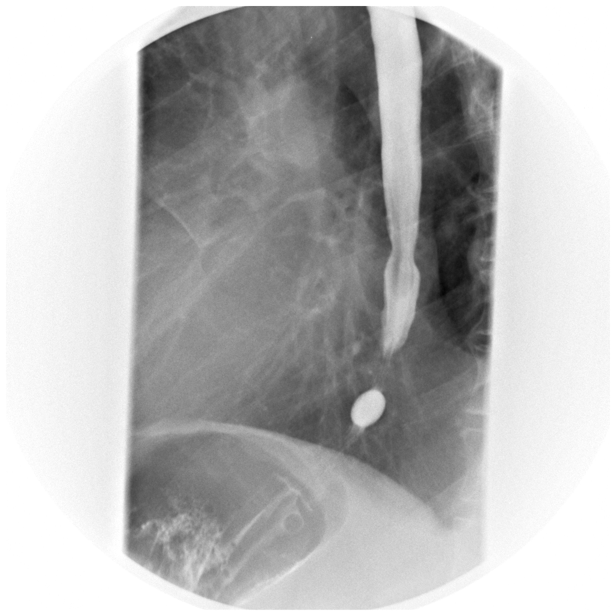

[Series 7: run · 2 of 16 slices shown (7 of 8)]
[im 1/16]
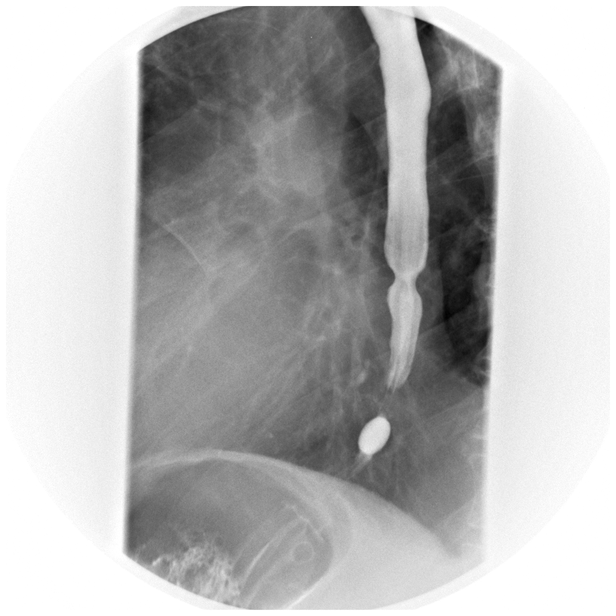
[im 11/16]
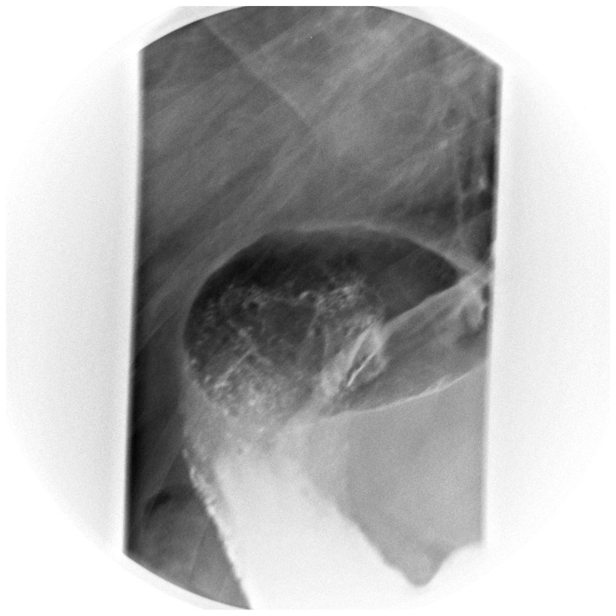

[Series 8: run · 1 of 1 slices shown (8 of 8)]
[im 1/1]
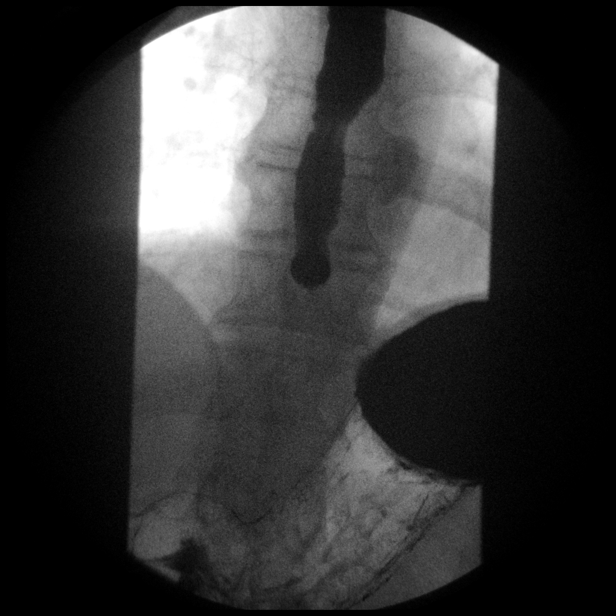

[14 of 24 positions shown; findings below may reference images not displayed]

FINDINGS: The patient had no difficulty initiating swallowing.
There was no penetration of contrast into the airway.  No
aspiration occurred.

Hypopharynx appeared normal.

Esophageal motility appeared normal.

No esophageal mass was evident.

The esophagus is mildly dilated to the level of the distal
esophagus where there is a smoothly marginated area of narrowing.

When the patient swallowed the 13 mm barium sulfate tablet, the
tablet would not pass through the area of narrowing into the
stomach. This is consistent with esophageal stricture.  No
ulceration or inflammation could be identified.  No discrete mass
or irregular margins were evident.

No hiatal hernia is evident.

There was no evidence of reflux  or mucosal changes of esophagitis.
IMPRESSION: The esophagus is mildly dilated to the level of the
distal esophagus where there is a smoothly marginated area of
narrowing.

When the patient swallowed the 13 mm barium sulfate tablet, the
tablet would not pass through the area of narrowing into the
stomach. This is consistent with significant esophageal narrowing.
No ulceration or inflammation could be identified.  No discrete
mass or irregular margins were evident. This most likely reflects
an esophageal stricture.  Suggest endoscopy for additional
evaluation.

## 2012-11-07 ENCOUNTER — Other Ambulatory Visit: Payer: Self-pay | Admitting: Internal Medicine

## 2012-11-22 ENCOUNTER — Other Ambulatory Visit: Payer: Self-pay | Admitting: *Deleted

## 2012-11-22 MED ORDER — TIOTROPIUM BROMIDE MONOHYDRATE 18 MCG IN CAPS: ORAL_CAPSULE | RESPIRATORY_TRACT | Status: DC

## 2012-11-24 ENCOUNTER — Other Ambulatory Visit: Payer: Self-pay | Admitting: Internal Medicine

## 2012-11-27 ENCOUNTER — Telehealth: Payer: Self-pay | Admitting: Internal Medicine

## 2012-11-27 NOTE — Telephone Encounter (Signed)
I spoke with pt spouse and she stated pt would like to get set up with the cancer screening test. She stated pt has an appt with MR on Monday and since he does not return until then they will discuss this with him at that time. Nothing further was needed

## 2012-12-02 ENCOUNTER — Ambulatory Visit (INDEPENDENT_AMBULATORY_CARE_PROVIDER_SITE_OTHER): Payer: Medicare Other | Admitting: Internal Medicine

## 2012-12-02 ENCOUNTER — Encounter: Payer: Self-pay | Admitting: Internal Medicine

## 2012-12-02 VITALS — BP 98/62 | HR 81 | Temp 97.6°F | Ht 71.0 in | Wt 184.2 lb

## 2012-12-02 DIAGNOSIS — Z720 Tobacco use: Secondary | ICD-10-CM

## 2012-12-02 DIAGNOSIS — Z129 Encounter for screening for malignant neoplasm, site unspecified: Secondary | ICD-10-CM

## 2012-12-02 DIAGNOSIS — J439 Emphysema, unspecified: Secondary | ICD-10-CM

## 2012-12-02 DIAGNOSIS — J4489 Other specified chronic obstructive pulmonary disease: Secondary | ICD-10-CM

## 2012-12-02 DIAGNOSIS — J449 Chronic obstructive pulmonary disease, unspecified: Secondary | ICD-10-CM

## 2012-12-02 DIAGNOSIS — F17200 Nicotine dependence, unspecified, uncomplicated: Secondary | ICD-10-CM

## 2012-12-02 NOTE — Patient Instructions (Addendum)
#  SMoking  - glad you quit  #Schizophrenia  - this could be worse due to quitting smoking; call your psychiatrist ASAP  #COPD  -stable  - continue ihalers  #Lung cancer screen  - meet with our [facility] to see if you can have cancer screen CT chest paying installments  #Followup  2-3 months COPD CAt score at followup

## 2012-12-02 NOTE — Progress Notes (Signed)
Subjective:    Patient ID: Chad Moss, male    DOB: [birth year], 57 y.o.   MRN: [id number]  HPI  July 13, 2010: 9 month followup stage 2 COPD. Last seen April 2011. In interim, has completed pulmonary rehab. Has had flu shot. States compliance with spiriva. No new complaints. Feels well. Hardly any dyspnea now. Hardly any cough. Attribtes all improvement to rehab and spiriva. Unable to quantify degree of improvement but he states he is happy with his current quality of life. Denies chest pain, cough, edema, orthopnea, pnd, hemoptysis, edema, syncope.   Patient Instructions:  1) continue spiriva  2) congratulations on finishing exercise program  3) - do daily walks and do the exercies they taught you  4) we will check your blood for alpha 1 a genetic cause of copd - MM 5) return in 9 months or sooner if you are sick  6) i am glad you are doing as well as you can  OV 04/07/11: Here with live in caretaker Katina Degree (she is talking on cell phone to someone else and would not hang up until I told her. She was also eating in room when nurse went in). Asymptomatic per caretaker and per patient. They both state that he is compliant with medications though caretaker has no idea what they are. She is unsure why he is on propranalol and lisinopril thought both deny dyspnea or cough.  Reported to have quit smokng in 1998 but surprinngly caretaker admitted that patient smoked "a little" in June 2012. Apparently he has quit since being reprimanded  OV 09/25/11: Patient comes in today for an acute sick visit.  He is usually followed for COPD, and unfortunately has been smoking since June of last year.  He gives a two-week history of rhinorrhea without purulence, a sore throat, as well as a cough with white mucus.  He does not have chest congestion, nor does he feel his breathing is worse than his usual baseline.  He notes significant postnasal drip.  REC Get chlorpheniramine 27m over the counter,  and take at bedtime and lunch for a week to see if runny nose/cough gets better  Can take robitussin dm over the counter for the cough is needed.  Use saline nasal spray for "nasal soreness", congestion.  You must stop smoking 100% if you want to stay well.  Keep followup apptm with Dr. RChase Caller  OV 11/14/2011 PMD SMaximino Greenland MD  Followup COPD,  Smoking and multifactorial cough  - COPD - reports stability. CAT score is 6 and reflects minimal symptom burden. Overall stable. Compliant with medications; Tough time reconciling meds because his caretaker BKatina Degreeis quite unhelpful. WE had to call pharmacy. Noted he is on non specific beta blocker; [facility] Unclear why he is getting this med. We have addressed this with caretaker in past but with no success  - SMoking - he is smoking despite ban on smoking at his facility. Knows he needs to quit but unable to. CAretaker knows he smokes as well  - Cough - some mild residual cough despite copd Rx +. He is on ace inhibitor and per caretaker PMD considering stopping this. No asspociated sputum. Cough is stable    Past, Family, Social reviewed: no change since last visit  REC #COPD - it is stable  - please continue your current inahler regimen  - [facility] in presence of copd  #Cough  - please get Dr Glendale Chard to stop your lisinopril because this is making cough worse  - she can do some other medication for high bp  #Smoking  Please workn on quitting smoking  #Followup  - 3 months or sooner if needed   OV 03/12/2012  Followup COPD,  Smoking and multifactorial cough  - COPD - reports stability.  Denies cough and dyspnea. Feels fine. Compliant with spiriva and albuterol prn but using albuterol twice daily out of habit though asymptomatic. Marland Kitchen CAT score is 9 and reflects minimal symptom burden; and all sym,ptoms due to  nocturia (sleep quality being affected because of nocturia) and fatigue Overall stable. Compliant with medications; We noted that he is off propranalool. Careetaker Katina Degree has confirmed this. Instead on serouquel by [facility]  - COugh - resolved. He is off lisinopril and propranolol   - SMoking - he is smoking despite ban on smoking at his facility. Knows he needs to quit but unable to. CAretaker knows he smokes as well. Unable to quit. He is interested in gaining weigght though PMD has advised weight loss. I told him he could gain weight if he quit smoking and he is interested but still says it will be real hard to quit smoking.      08/06/2012 Acute OV  Complains of nasal congestion,dry cough,soreness on left side of chest x 1 wk.,no fcs,no sob, no wheezing,nose yellow and green discharge OTC mucinex not helpign  Cough is keeping him up at night  No fever, recent abx or travel.  No chest pain , hemoptysis or weight loss.    Doxycycline 167m Twice daily For 7 days -take w/ food  Mucinex DM Twice daily As needed Cough/congestion .  Claritin 114mAt bedtime As needed Drainage  Fluids and rest  Hydromet 1-2 tsp every 4-6 hr As needed Cough , may make you sleepy  Please contact office for sooner follow up if symptoms do not improve or worsen or seek emergency care  Follow up Dr. RaChase Callern 6 weeks and As needed     OV 10/07/2012. Followup COPD and smoking  - Overall reports stability in shortness of breath and cough. Only minimal symptoms. He says he is doing well. He had an exacerbation that was treated in the office in February 2014 but currently he is over it. He is compliant with Spiriva. His caretaker is not with him today  - Smoking he continues to smoke 3 cigarettes a day. He knows that he needs to quit and he wants help. He has anxiety and depression and is taking anxiolytics, antidepressants and antiparkinsonian drugs and anticholinergics [basically a  lot of medications on the beer list]. He says nicotine patch does not work. He denies any suicidal ideations or frank depressive symptoms. He is under the care of a psychiatrist  - Lung cancer screening: We discussed this and he cannot output $300 out of pocket  REC  #smoking STart wellbutrin or zyban only other direction of psychiatrist because this is high risk for me to prescribe given your history of depression and anxiety   #COPD - Continue daily Spiriva - Have flu shot in the fall  #Lung cancer screening - If you are interested in this, it will cost you $300 out of pocket  #Followup - 2 months to review quit smoking and COPD  OV 12/02/2012  LaArnette Norrisrontis presents with his caretaker to review his  COPD, smoking and lung cancer screening  - His COPD cat score is 2 times worse than baseline [see below] but in talking to him he reports stable COPD is symptoms  -In terms of smoking: He and his caretaker state that they could not get hold of his psychiatrist at [facility] and start either Wellbutrin or Zyban. However he was extremely keen to quit smoking due to fear of cancer and therefore he quit smoking couple weeks ago. However, risk paranoid schizophrenia is worse since he quit smoking  - In terms of lung cancer screening: He is extremely paranoid is lung cancer. He is keen to have the CT scan of the chest he said he can take $300 out of pocket but only in monthly installment    CAT COPD Symptom and Quality of Life Score (glaxo smith kline trademark)  0 ->5 11/14/11 03/12/2012  12/02/2012   Never Cough -> Cough all the time 2 0 3  No phlegm in chest -> Chest is full of phlegm 0 0 0  No chest tightness -> Chest feels very tight 0 0 0  No dyspnea for 1 flight stairs/hill -> Very dyspneic for 1 flight of stairs 00 0 3  No limitations for ADL at home -> Very limited with ADL at home 0 0 3  Confident leaving home -> Not at all confident leaving home 0 0 5  Sleep soundly -> Do  not sleep soundly because of lung condition 0 4 0  Lots of Energy -> No energy at all 4 5 4   TOTAL Score (max 40)  6 9 18     Past, Family, Social reviewed: Current meds for it has worsened to quit smoking. They are trying to get hold ofs psychiatrist at [place]  Constitutional: Negative for fever and unexpected weight change.  HENT: Negative for ear pain, nosebleeds, congestion, sore throat, rhinorrhea, sneezing, trouble swallowing, dental problem, postnasal drip and sinus pressure.   Eyes: Negative for redness and itching.  Respiratory: Positive for cough. Negative for chest tightness, shortness of breath and wheezing.   Cardiovascular: Negative for palpitations and leg swelling.  Gastrointestinal: Negative for nausea and vomiting.  Genitourinary: Negative for dysuria.  Musculoskeletal: Negative for joint swelling.  Skin: Negative for rash.  Neurological: Positive for dizziness, weakness and light-headedness. Negative for headaches.  Hematological: Does not bruise/bleed easily.  Psychiatric/Behavioral: Negative for dysphoric mood. The patient is not nervous/anxious.        Objective:   Physical Exam  GEN: A/Ox3; pleasant , NAD, well nourished   HEENT:  [place]/AT,  EACs-clear, TMs-wnl, NOSE-clear, THROAT-clear, no lesions,   NECK:  Supple w/ fair ROM; no JVD; normal carotid impulses w/o bruits; no thyromegaly or nodules palpated; no lymphadenopathy.  RESP  Few rhonchi , no accessory muscle use, no dullness to percussion  CARD:  RRR, no m/r/g  , no peripheral edema, pulses intact, no cyanosis or clubbing.  GI:   Soft & nt; nml bowel sounds; no organomegaly or masses detected.  Musco: Warm bil, no deformities or joint swelling noted.   Neuro: alert, no focal deficits noted.    Skin: Warm, no lesions or rashes          Assessment & Plan:

## 2012-12-03 NOTE — Assessment & Plan Note (Signed)
Congratulated on quitting smoking but I'm worried that is parous schizophrenia is worse. I've urged them to talk to the psychiatrist as soon as possible

## 2012-12-03 NOTE — Assessment & Plan Note (Signed)
COPD appears stable plan is to continue current inhaler therapy

## 2012-12-03 NOTE — Assessment & Plan Note (Signed)
I have ordered a low-dose CT scan of the chest but I will have him check with my care coordinator to see if payments in serial installments will be accepted

## 2012-12-13 ENCOUNTER — Ambulatory Visit (INDEPENDENT_AMBULATORY_CARE_PROVIDER_SITE_OTHER)
Admission: RE | Admit: 2012-12-13 | Discharge: 2012-12-13 | Disposition: A | Discharge status: Home / Self Care | Payer: Medicare Other | Source: Ambulatory Visit | Attending: Internal Medicine | Admitting: Internal Medicine

## 2012-12-13 DIAGNOSIS — J439 Emphysema, unspecified: Secondary | ICD-10-CM

## 2012-12-13 DIAGNOSIS — J438 Other emphysema: Secondary | ICD-10-CM

## 2012-12-13 DIAGNOSIS — Z129 Encounter for screening for malignant neoplasm, site unspecified: Secondary | ICD-10-CM

## 2012-12-13 IMAGING — CT CT CHEST NODULE FOLLOW UP LOW DOSE W/O CM
2 of 5 series · 14 of 40 positions shown, 17 images · IV contrast (Omnipaque 300)
Comparison: [DATE].

CLINICAL DATA: Screening, smoker.

CT CHEST SCREENING WITHOUT CONTRAST
TECHNIQUE: Multidetector CT imaging of the chest was performed
following the standard low-dose protocol without IV contrast.

[Series 2: chest routine with · axial · 0.66mm/px · z∈[-269,-19]mm · 11 of 61 slices shown, 14 images]
[im 6/61  mediastinal]
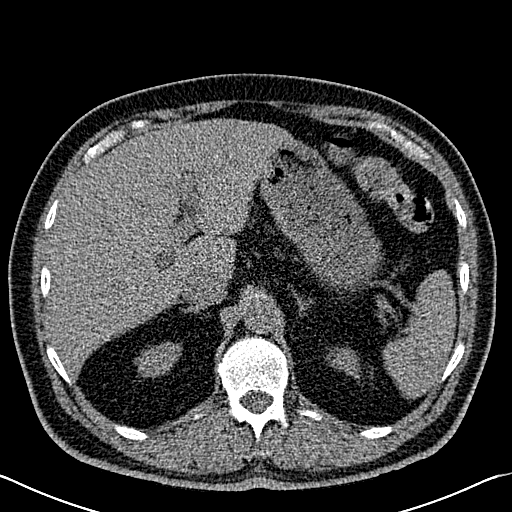
[im 6/61  lung]
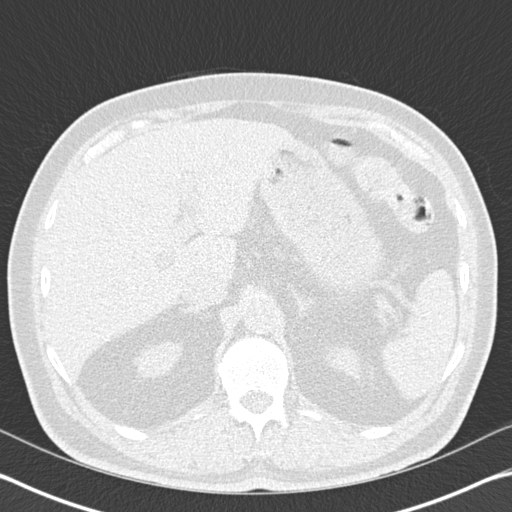
[im 11/61  lung]
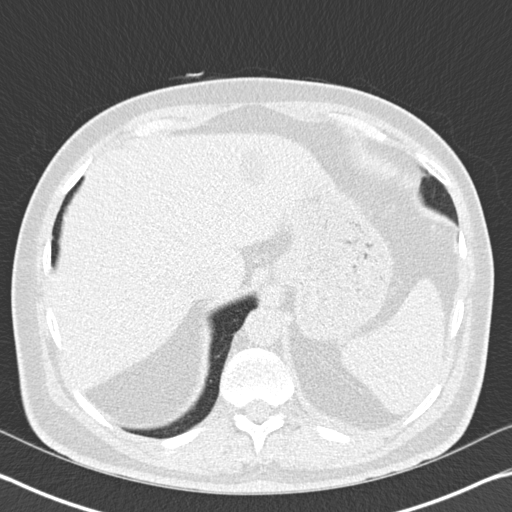
[im 16/61  lung]
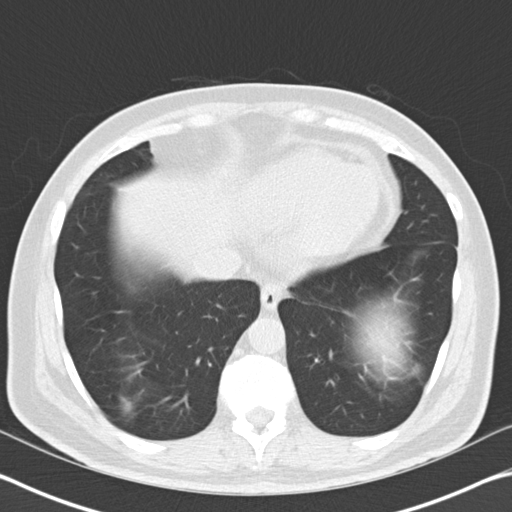
[im 21/61  lung]
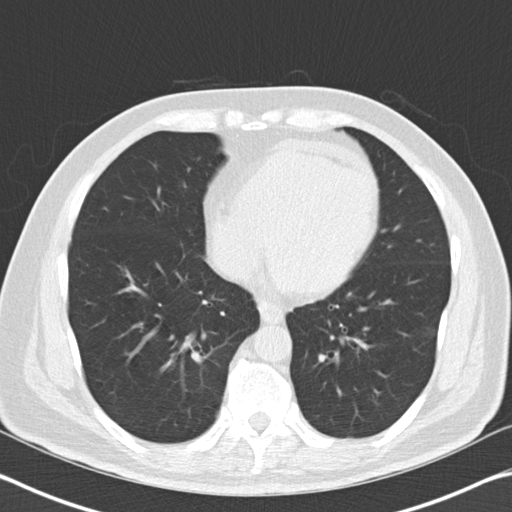
[im 26/61  mediastinal]
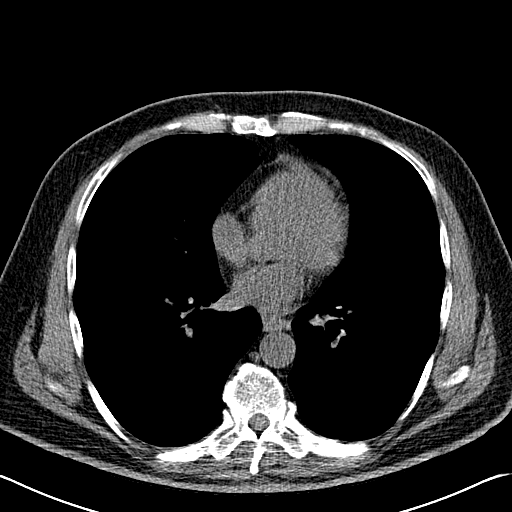
[im 26/61  lung]
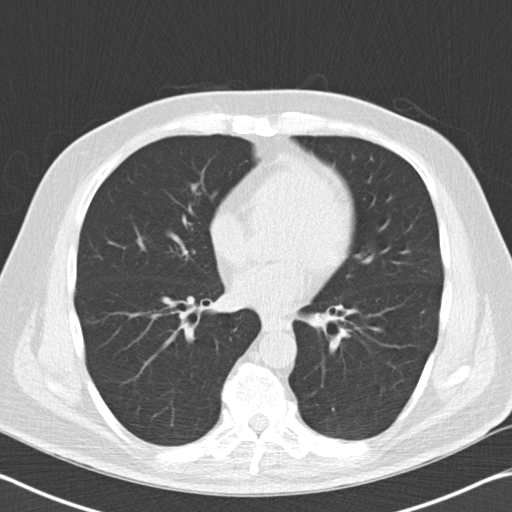
[im 31/61  lung]
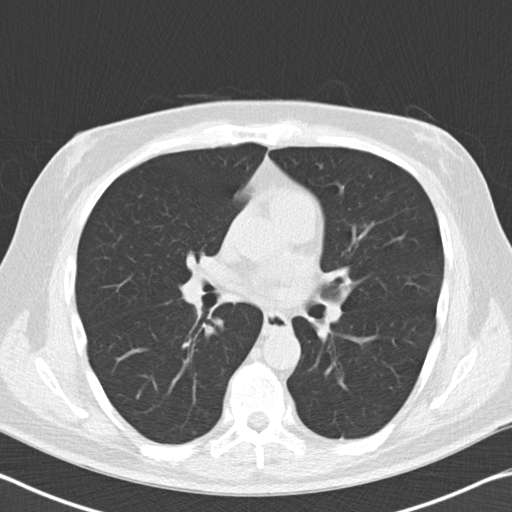
[im 36/61  lung]
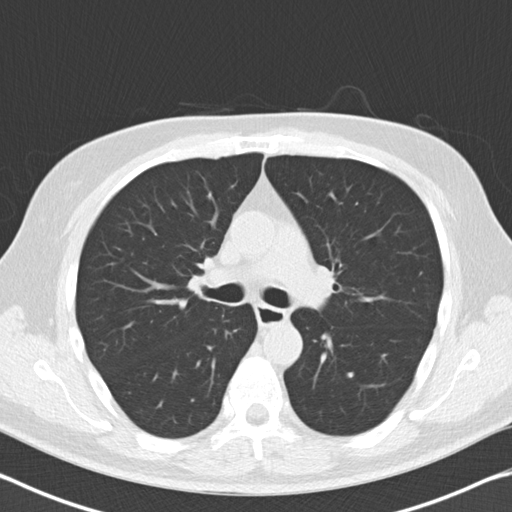
[im 41/61  lung]
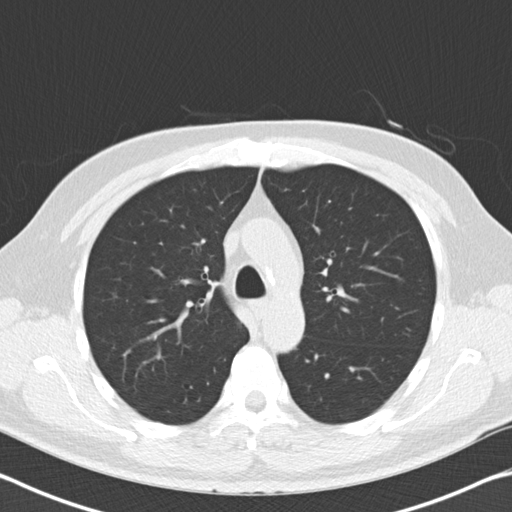
[im 46/61  mediastinal]
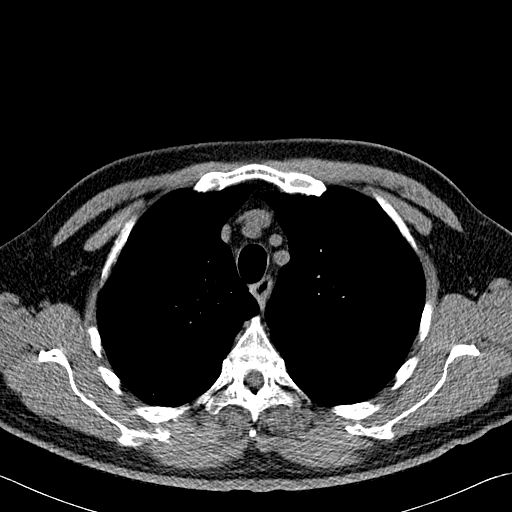
[im 46/61  lung]
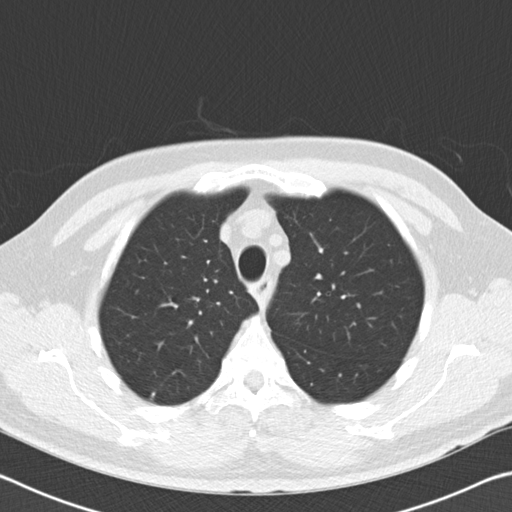
[im 51/61  lung]
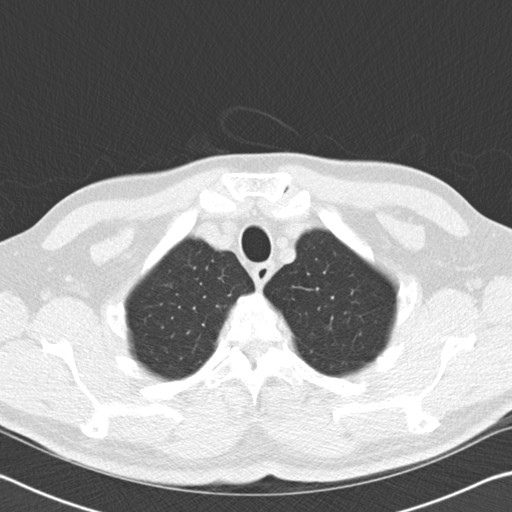
[im 56/61  lung]
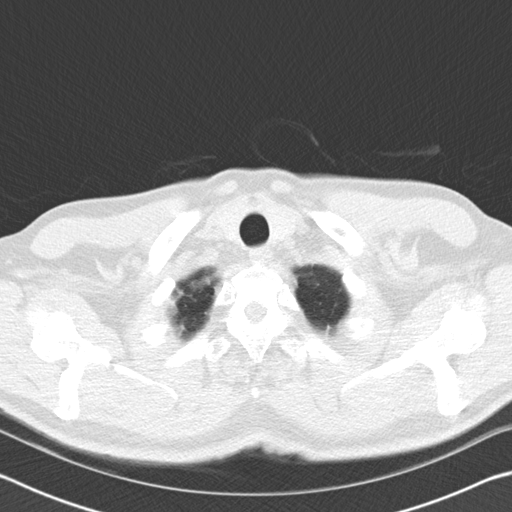

[Series 602: cor · coronal · 0.66mm/px · 3 of 110 slices shown]
[im 22/110  lung]
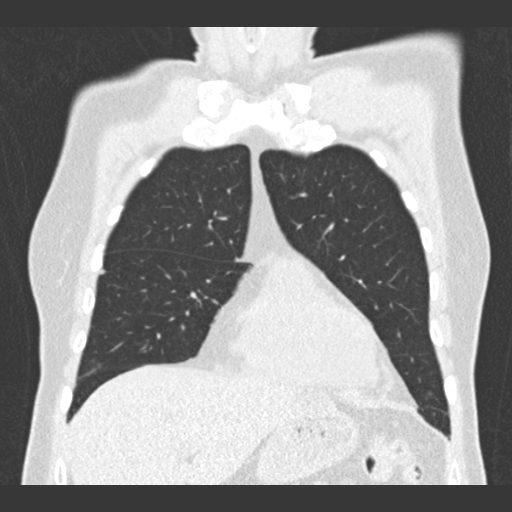
[im 44/110  lung]
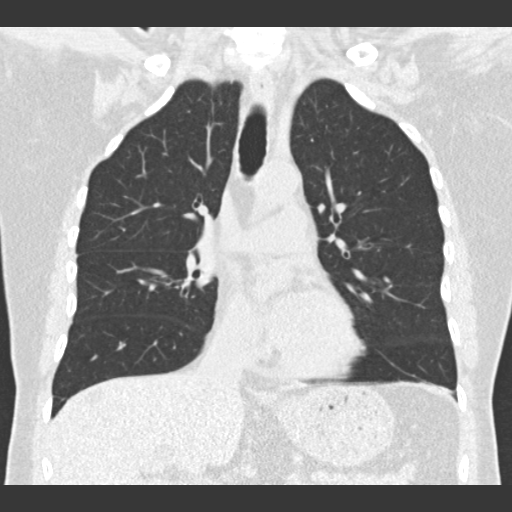
[im 66/110  lung]
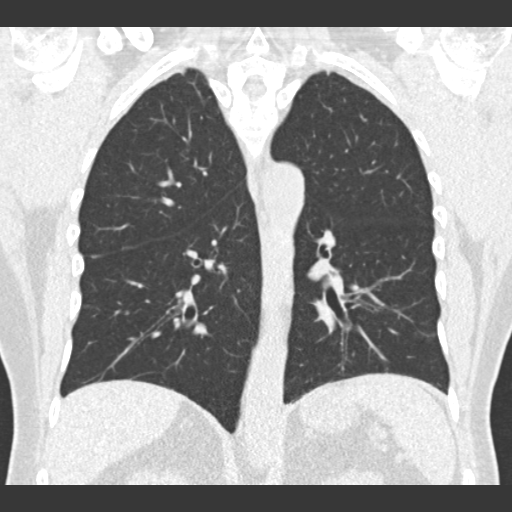

[14 of 40 positions shown; findings below may reference images not displayed]

FINDINGS: No pathologically enlarged mediastinal, hilar or axillary
lymph nodes.  Atherosclerotic calcification of the arterial
vasculature, including minimal involvement of the coronary
arteries.  Heart size normal.  Probable anterior pericardial
thickening.

Minimal biapical pleural parenchymal scarring. Scattered calcified
granulomas.  There are a few scattered tiny perifissural and
subpleural nodular densities, measuring 4 mm or less in size.
Minimal scattered linear scarring. No pleural fluid.  Airway is
unremarkable.

Incidental imaging of the upper abdomen shows low attenuation
lesions in the liver, measuring up to 2.7 cm, as before.  No
worrisome lytic or sclerotic lesions.
IMPRESSION: Scattered pulmonary parenchymal calcified granulomas and probable
noncalcified granulomas or tiny subpleural lymph nodes.  No
worrisome pulmonary nodules.  Continued annual low-dose CT lung
screening is recommended.

## 2013-01-07 ENCOUNTER — Other Ambulatory Visit: Payer: Self-pay | Admitting: Internal Medicine

## 2013-03-04 ENCOUNTER — Ambulatory Visit (INDEPENDENT_AMBULATORY_CARE_PROVIDER_SITE_OTHER): Payer: Medicare Other | Admitting: Internal Medicine

## 2013-03-04 ENCOUNTER — Encounter: Payer: Self-pay | Admitting: Internal Medicine

## 2013-03-04 VITALS — BP 120/80 | HR 84 | Ht 71.0 in | Wt 183.2 lb

## 2013-03-04 DIAGNOSIS — Z720 Tobacco use: Secondary | ICD-10-CM

## 2013-03-04 DIAGNOSIS — J449 Chronic obstructive pulmonary disease, unspecified: Secondary | ICD-10-CM

## 2013-03-04 DIAGNOSIS — Z23 Encounter for immunization: Secondary | ICD-10-CM

## 2013-03-04 DIAGNOSIS — F17200 Nicotine dependence, unspecified, uncomplicated: Secondary | ICD-10-CM

## 2013-03-04 NOTE — Patient Instructions (Addendum)
#  Smoking  - please work on staying quit  #COPD  - stable - continue inhalers - flu shot 03/04/2013  #lung cancer screen - holding off till $ issues resolved  #Followup 6 months or sooner if needed

## 2013-03-04 NOTE — Progress Notes (Signed)
Subjective:    Patient ID: Chad Moss, male    DOB: 1955/12/03, 57 y.o.   MRN: [id number]  HPI July 13, 2010: 9 month followup stage 2 COPD. Last seen April 2011. In interim, has completed pulmonary rehab. Has had flu shot. States compliance with spiriva. No new complaints. Feels well. Hardly any dyspnea now. Hardly any cough. Attribtes all improvement to rehab and spiriva. Unable to quantify degree of improvement but he states he is happy with his current quality of life. Denies chest pain, cough, edema, orthopnea, pnd, hemoptysis, edema, syncope.   Patient Instructions:  1) continue spiriva  2) congratulations on finishing exercise program  3) - do daily walks and do the exercies they taught you  4) we will check your blood for alpha 1 a genetic cause of copd - MM 5) return in 9 months or sooner if you are sick  6) i am glad you are doing as well as you can  OV 04/07/11: Here with live in caretaker Katina Degree (she is talking on cell phone to someone else and would not hang up until I told her. She was also eating in room when nurse went in). Asymptomatic per caretaker and per patient. They both state that he is compliant with medications though caretaker has no idea what they are. She is unsure why he is on propranalol and lisinopril thought both deny dyspnea or cough.  Reported to have quit smokng in 1998 but surprinngly caretaker admitted that patient smoked "a little" in June 2012. Apparently he has quit since being reprimanded  OV 09/25/11: Patient comes in today for an acute sick visit.  He is usually followed for COPD, and unfortunately has been smoking since June of last year.  He gives a two-week history of rhinorrhea without purulence, a sore throat, as well as a cough with white mucus.  He does not have chest congestion, nor does he feel his breathing is worse than his usual baseline.  He notes significant postnasal drip.  REC Get chlorpheniramine 93m over the counter, and  take at bedtime and lunch for a week to see if runny nose/cough gets better  Can take robitussin dm over the counter for the cough is needed.  Use saline nasal spray for "nasal soreness", congestion.  You must stop smoking 100% if you want to stay well.  Keep followup apptm with Dr. RChase Caller  OV 11/14/2011 PMD SMaximino Greenland MD  Followup COPD,  Smoking and multifactorial cough  - COPD - reports stability. CAT score is 6 and reflects minimal symptom burden. Overall stable. Compliant with medications; Tough time reconciling meds because his caretaker BKatina Degreeis quite unhelpful. WE had to call pharmacy. Noted he is on non specific beta blocker; [facility] Unclear why he is getting this med. We have addressed this with caretaker in past but with no success  - SMoking - he is smoking despite ban on smoking at his facility. Knows he needs to quit but unable to. CAretaker knows he smokes as well  - Cough - some mild residual cough despite copd Rx +. He is on ace inhibitor and per caretaker PMD considering stopping this. No asspociated sputum. Cough is stable    Past, Family, Social reviewed: no change since last visit  REC #COPD - it is stable  - please continue your current inahler regimen  - [facility]  in presence of copd  #Cough  - please get Dr Glendale Chard to stop your lisinopril because this is making cough worse  - she can do some other medication for high bp  #Smoking  Please workn on quitting smoking  #Followup  - 3 months or sooner if needed   OV 03/12/2012  Followup COPD,  Smoking and multifactorial cough  - COPD - reports stability.  Denies cough and dyspnea. Feels fine. Compliant with spiriva and albuterol prn but using albuterol twice daily out of habit though asymptomatic. Marland Kitchen CAT score is 9 and reflects minimal symptom burden; and all sym,ptoms due to nocturia  (sleep quality being affected because of nocturia) and fatigue Overall stable. Compliant with medications; We noted that he is off propranalool. Careetaker Katina Degree has confirmed this. Instead [facility]ouquel [facility]  - COugh - resolved. He is off lisinopril and propranolol   - SMoking - he is smoking despite ban on smoking at his facility. Knows he needs to quit but unable to. CAretaker knows he smokes as well. Unable to quit. He is interested in gaining weigght though PMD has advised weight loss. I told him he could gain weight if he quit smoking and he is interested but still says it will be real hard to quit smoking.      08/06/2012 Acute OV  Complains of nasal congestion,dry cough,soreness on left side of chest x 1 wk.,no fcs,no sob, no wheezing,nose yellow and green discharge OTC mucinex not helpign  Cough is keeping him up at night  No fever, recent abx or travel.  No chest pain , hemoptysis or weight loss.    Doxycycline 112m Twice daily For 7 days -take w/ food  Mucinex DM Twice daily As needed Cough/congestion .  Claritin 11mAt bedtime As needed Drainage  Fluids and rest  Hydromet 1-2 tsp every 4-6 hr As needed Cough , may make you sleepy  Please contact office for sooner follow up if symptoms do not improve or worsen or seek emergency care  Follow up Dr. RaChase Callern 6 weeks and As needed     OV 10/07/2012. Followup COPD and smoking  - Overall reports stability in shortness of breath and cough. Only minimal symptoms. He says he is doing well. He had an exacerbation that was treated in the office in February 2014 but currently he is over it. He is compliant with Spiriva. His caretaker is not with him today  - Smoking he continues to smoke 3 cigarettes a day. He knows that he needs to quit and he wants help. He has anxiety and depression and is taking anxiolytics, antidepressants and antiparkinsonian drugs and anticholinergics [basically a lot of  medications on the beer list]. He says nicotine patch does not work. He denies any suicidal ideations or frank depressive symptoms. He is under the care of a psychiatrist  - Lung cancer screening: We discussed this and he cannot output $300 out of pocket  REC  #smoking STart wellbutrin or zyban only other direction of psychiatrist because this is high risk for me to prescribe given your history of depression and anxiety   #COPD - Continue daily Spiriva - Have flu shot in the fall  #Lung cancer screening - If you are interested in this, it will cost you $300 out of pocket  #Followup - 2 months to review quit smoking and COPD  OV 12/02/2012  LaArnette Norrisrontis presents with his caretaker to review his COPD,  smoking and lung cancer screening  - His COPD cat score is 2 times worse than baseline [see below] but in talking to him he reports stable COPD is symptoms  -In terms of smoking: He and his caretaker state that they could not get hold of his psychiatrist at [facility] and start either Wellbutrin or Zyban. However he was extremely keen to quit smoking due to fear of cancer and therefore he quit smoking couple weeks ago. However, risk paranoid schizophrenia is worse since he quit smoking  - In terms of lung cancer screening: He is extremely paranoid is lung cancer. He is keen to have the CT scan of the chest he said he can take $300 out of pocket but only in monthly installment     #SMoking  - glad you quit  #Schizophrenia  - this could be worse due to quitting smoking; call your psychiatrist ASAP  #COPD  -stable  - continue ihalers  #Lung cancer screen  - meet with our [facility] to see if you can have cancer screen CT chest paying installments  #Followup  2-3 months  COPD CAt score at followup   OV 03/04/2013 Followup - COPD: He was at his dyspnea at baseline cough is stable. He is compliant with his inhalers. There is no history of any exacerbation or admissions the visits to  the emergency room or urgent care. He satisfied with his quality-of-life. COPD cat score was not done today  - Smoking: At last visit in June 2014 his smoking was in remission but his schizophrenia got worse. He saw a psychiatrist at [facility] and his medications have been adjusted and his schizophrenia is improved but he relapsed back into smoking but he says that for the past one week he is quit he did he thinks he will remain quit  - Lung cancer screen: This is on hold due to money issues  CAT COPD Symptom and Quality of Life Score (glaxo smith kline trademark)  0 ->5 11/14/11 03/12/2012  12/02/2012   Never Cough -> Cough all the time 2 0 3  No phlegm in chest -> Chest is full of phlegm 0 0 0  No chest tightness -> Chest feels very tight 0 0 0  No dyspnea for 1 flight stairs/hill -> Very dyspneic for 1 flight of stairs 00 0 3  No limitations for ADL at home -> Very limited with ADL at home 0 0 3  Confident leaving home -> Not at all confident leaving home 0 0 5  Sleep soundly -> Do not sleep soundly because of lung condition 0 4 0  Lots of Energy -> No energy at all 4 5 4   TOTAL Score (max 40)  6 9 18       Review of Systems  Constitutional: Negative for fever and unexpected weight change.  HENT: Negative for ear pain, nosebleeds, congestion, sore throat, rhinorrhea, sneezing, trouble swallowing, dental problem, postnasal drip and sinus pressure.   Eyes: Negative for redness and itching.  Respiratory: Negative for cough, chest tightness, shortness of breath and wheezing.   Cardiovascular: Negative for palpitations and leg swelling.  Gastrointestinal: Negative for nausea and vomiting.  Genitourinary: Negative for dysuria.  Musculoskeletal: Negative for joint swelling.  Skin: Negative for rash.  Neurological: Negative for headaches.  Hematological: Does not bruise/bleed easily.  Psychiatric/Behavioral: Negative for dysphoric mood. The patient is not nervous/anxious.         Objective:   Physical Exam Physical Exam  GEN: A/Ox3; pleasant ,  NAD, well nourished   HEENT:  [place]/AT,  EACs-clear, TMs-wnl, NOSE-clear, THROAT-clear, no lesions,   NECK:  Supple w/ fair ROM; no JVD; normal carotid impulses w/o bruits; no thyromegaly or nodules palpated; no lymphadenopathy.  RESP  Few rhonchi , no accessory muscle use, no dullness to percussion  CARD:  RRR, no m/r/g  , no peripheral edema, pulses intact, no cyanosis or clubbing.  GI:   Soft & nt; nml bowel sounds; no organomegaly or masses detected.  Musco: Warm bil, no deformities or joint swelling noted.   Neuro: alert, no focal deficits noted.       Assessment & Plan:

## 2013-03-05 NOTE — Assessment & Plan Note (Signed)
#  Smoking  - please work on staying quit  #COPD  - stable - continue inhalers - flu shot 03/04/2013  #lung cancer screen - holding off till $ issues resolved  #Followup 6 months or sooner if needed

## 2013-03-05 NOTE — Assessment & Plan Note (Signed)
Currently in remission and frequent relapses. Currently in remission. Difficulties that every time he remits smoking schizophrenic gets worse. I have encouraged him to stay quit this time  3 minutes face-to-face counseling

## 2013-04-25 ENCOUNTER — Telehealth: Payer: Self-pay | Admitting: Internal Medicine

## 2013-04-25 MED ORDER — DOXYCYCLINE HYCLATE 100 MG PO TABS: 100.0000 mg | ORAL_TABLET | Freq: Two times a day (BID) | ORAL | Status: DC

## 2013-04-25 NOTE — Telephone Encounter (Signed)
z-pak 

## 2013-04-25 NOTE — Telephone Encounter (Signed)
Doxy 100 mg bid x 7 days

## 2013-04-25 NOTE — Telephone Encounter (Signed)
Rx for doxy has been sent. LMTCB x1 for pt to make aware of change.

## 2013-04-25 NOTE — Telephone Encounter (Signed)
I spoke with pt care taker. C/o sore throat, bilateral ear pain, nasal congestion, PND x couple days. She has been giving him advil cold and sinus without relief. Requesting ABX since he does have COPD and is not in his chest yet. Please advise MW as MR is not on scheduled. Thanks  Allergies  Allergen Reactions  . Codeine Anaphylaxis  . Penicillins     REACTION: hives  . Sulfamethoxazole-Trimethoprim     REACTION: hives  . Sulfonamide Derivatives     REACTION: hives

## 2013-04-25 NOTE — Telephone Encounter (Signed)
MW--  There is a drug interaction between the seroquel and the zpak with prolonging QT.  Please advise. Thanks  Allergies  Allergen Reactions  . Codeine Anaphylaxis  . Penicillins     REACTION: hives  . Sulfamethoxazole-Trimethoprim     REACTION: hives  . Sulfonamide Derivatives     REACTION: hives

## 2013-04-25 NOTE — Telephone Encounter (Deleted)
Called and spoke with pts caregiver.  She stated that she was sick last week with sinus problems.  She stated pt is having ear pain, chills, had congestion x 3-4 days.  She has given him tylenol sinus and coricidin and this seemed to help his ear pain some.  Will forward to MW ---doc of the day for recs.  Please advise. Thanks   Last ov--03/04/2013 with no pending appts with MR.   Allergies  Allergen Reactions  . Codeine Anaphylaxis  . Penicillins     REACTION: hives  . Sulfamethoxazole-Trimethoprim     REACTION: hives  . Sulfonamide Derivatives     REACTION: hives     Current Outpatient Prescriptions on File Prior to Visit  Medication Sig Dispense Refill  . alfuzosin (UROXATRAL) 10 MG 24 hr tablet Take 10 mg by mouth daily.        Marland Kitchen b complex vitamins tablet Take 1 tablet by mouth daily.        . bimatoprost (LUMIGAN) 0.03 % ophthalmic solution Place 1 drop into the left eye at bedtime.       . brimonidine (ALPHAGAN) 0.15 % ophthalmic solution Place 1 drop into the left eye 2 (two) times daily.       . cholestyramine (QUESTRAN) 4 G packet Take 1 packet by mouth 2 (two) times daily.       . Choline Fenofibrate (TRILIPIX) 135 MG capsule Take 135 mg by mouth daily.       . clonazePAM (KLONOPIN) 1 MG tablet Take 1 mg by mouth 2 (two) times daily as needed. Anxiety      . dicyclomine (BENTYL) 10 MG capsule Take 10 mg by mouth daily.       . dorzolamide-timolol (COSOPT) 22.3-6.8 MG/ML ophthalmic solution Place 1 drop into the left eye 2 (two) times daily.      Marland Kitchen dutasteride (AVODART) 0.5 MG capsule Take 0.5 mg by mouth daily.        Marland Kitchen esomeprazole (NEXIUM) 20 MG capsule Take 20 mg by mouth daily before breakfast.        . fesoterodine (TOVIAZ) 4 MG TB24 Take 4 mg by mouth daily.      Marland Kitchen loxapine (LOXITANE) 10 MG capsule Take 10 mg by mouth 2 (two) times daily.       . mercaptopurine (PURINETHOL) 50 MG tablet Take 75 mg by mouth daily. 1 tablet and a half tablet Give on an empty stomach 1  hour before or 2 hours after meals. Caution: Chemotherapy.      . mesalamine (PENTASA) 250 MG CR capsule Take 1,500 mg by mouth 3 (three) times daily.      . mirabegron ER (MYRBETRIQ) 25 MG TB24 Take 25 mg by mouth daily.      . ondansetron (ZOFRAN) 4 MG tablet Take 4 mg by mouth every 8 (eight) hours as needed.      . potassium chloride (KLOR-CON) 10 MEQ CR tablet Take 20 mEq by mouth daily.        Marland Kitchen PROAIR HFA 108 (90 BASE) MCG/ACT inhaler INHALE 2 PUFFS INTO THE LUNGS EVERY 6 HOURS AS NEEDED  8.5 g  0  . QUEtiapine (SEROQUEL) 100 MG tablet Take 100 mg by mouth at bedtime.      . rosuvastatin (CRESTOR) 40 MG tablet Take 40 mg by mouth daily.      Marland Kitchen tiotropium (SPIRIVA HANDIHALER) 18 MCG inhalation capsule PLACE 1 CAPSULE INTO INHALER AND INHALE DAILY  30 capsule  6  .  traZODone (DESYREL) 100 MG tablet Take 50 mg by mouth at bedtime.       . Vitamin D, Ergocalciferol, (DRISDOL) 50000 UNITS CAPS Take 50,000 Units by mouth every 7 (seven) days.         No current facility-administered medications on file prior to visit.

## 2013-04-25 NOTE — Telephone Encounter (Signed)
Returning call can be reached at [phone].Elnita Maxwell

## 2013-04-25 NOTE — Telephone Encounter (Signed)
lmomtcb x1 for pt 

## 2013-04-28 NOTE — Telephone Encounter (Signed)
I spoke with [facility] and pt already picked up RX. Will sign off message

## 2013-04-30 ENCOUNTER — Telehealth: Payer: Self-pay | Admitting: Internal Medicine

## 2013-04-30 NOTE — Telephone Encounter (Signed)
Spoke with patient-aware that he has been recommended to call his PCP tomorrow and see about getting in for acute visit. Pt will do so; if unable to get an Ov with PCP then patient will call us to set up ENT referral.

## 2013-04-30 NOTE — Telephone Encounter (Signed)
Per 11.7.14 phone note, pt had called in with similar symptoms and was rx'd a zpak by MW as doc of day.  D/t possible interaction between zpak and seroquel, pt was changed to doxycycline 100mg  #14.  Called spoke with patient who reported that his sore throat and bilateral ear discomfort is no better on the Doxycycline -- has 2 days left.  Pt also reports a prod cough with thick yellow mucus and PND.  Pt denies any head congestion, dyspnea, wheezing, chest tightness, hemoptysis, f/c/s.  Pt did ask for a zpak for which I explained the above about the interaction between zpak and Seroquel.  Pt verbalized his understanding.    MR please advise, thank you. Last ov 9.16.14 [facility] [address] Allergies  Allergen Reactions  . Codeine Anaphylaxis  . Penicillins     REACTION: hives  . Sulfamethoxazole-Trimethoprim     REACTION: hives  . Sulfonamide Derivatives     REACTION: hives

## 2013-04-30 NOTE — Telephone Encounter (Signed)
lmomtcb  

## 2013-04-30 NOTE — Telephone Encounter (Signed)
Returning call can be reached at [phone].Elnita Maxwell

## 2013-04-30 NOTE — Telephone Encounter (Signed)
Dont know .  Can he be seen by his Maximino Greenland, MD his PCP. This is head and neck issue and he is not responding to antibioptic #1. Either PCP or ENT referral if PCP cannot see him by end of week  Dr. Brand Males, M.D., [facility].C.P Pulmonary and Critical Care Medicine Staff Physician [facility] Pager: [phone], If no answer or between  15:00h - 7:00h: call [phone]  04/30/2013 5:06 PM

## 2013-07-02 ENCOUNTER — Other Ambulatory Visit: Payer: Self-pay | Admitting: Internal Medicine

## 2013-07-02 MED ORDER — ALBUTEROL SULFATE HFA 108 (90 BASE) MCG/ACT IN AERS: INHALATION_SPRAY | RESPIRATORY_TRACT | Status: DC

## 2013-07-02 NOTE — Telephone Encounter (Signed)
Rx refill sent electronically

## 2013-07-11 ENCOUNTER — Other Ambulatory Visit: Payer: Self-pay | Admitting: *Deleted

## 2013-07-11 MED ORDER — TIOTROPIUM BROMIDE MONOHYDRATE 18 MCG IN CAPS: ORAL_CAPSULE | RESPIRATORY_TRACT | Status: DC

## 2013-09-02 ENCOUNTER — Encounter: Payer: Self-pay | Admitting: Internal Medicine

## 2013-09-02 ENCOUNTER — Ambulatory Visit (INDEPENDENT_AMBULATORY_CARE_PROVIDER_SITE_OTHER): Payer: Medicare Other | Admitting: Internal Medicine

## 2013-09-02 ENCOUNTER — Other Ambulatory Visit (INDEPENDENT_AMBULATORY_CARE_PROVIDER_SITE_OTHER): Payer: Medicare Other

## 2013-09-02 VITALS — BP 100/60 | HR 84 | Ht 71.0 in | Wt 181.0 lb

## 2013-09-02 DIAGNOSIS — Z129 Encounter for screening for malignant neoplasm, site unspecified: Secondary | ICD-10-CM

## 2013-09-02 DIAGNOSIS — Z720 Tobacco use: Secondary | ICD-10-CM

## 2013-09-02 DIAGNOSIS — R5383 Other fatigue: Principal | ICD-10-CM

## 2013-09-02 DIAGNOSIS — J449 Chronic obstructive pulmonary disease, unspecified: Secondary | ICD-10-CM

## 2013-09-02 DIAGNOSIS — R5381 Other malaise: Secondary | ICD-10-CM

## 2013-09-02 DIAGNOSIS — F17200 Nicotine dependence, unspecified, uncomplicated: Secondary | ICD-10-CM

## 2013-09-02 LAB — TSH: TSH: 0.78 u[IU]/mL (ref 0.35–5.50)

## 2013-09-02 NOTE — Progress Notes (Signed)
Subjective:    Patient ID: Chad Moss, male    DOB: 09-28-1955, 58 y.o.   MRN: [id number]  HPI PCP Maximino Greenland, MD   July 13, 2010: 9 month followup stage 2 COPD. Last seen April 2011. In interim, has completed pulmonary rehab. Has had flu shot. States compliance with spiriva. No new complaints. Feels well. Hardly any dyspnea now. Hardly any cough. Attribtes all improvement to rehab and spiriva. Unable to quantify degree of improvement but he states he is happy with his current quality of life. Denies chest pain, cough, edema, orthopnea, pnd, hemoptysis, edema, syncope.   Patient Instructions:  1) continue spiriva  2) congratulations on finishing exercise program  3) - do daily walks and do the exercies they taught you  4) we will check your blood for alpha 1 a genetic cause of copd - MM 5) return in 9 months or sooner if you are sick  6) i am glad you are doing as well as you can  OV 04/07/11: Here with live in caretaker Katina Degree (she is talking on cell phone to someone else and would not hang up until I told her. She was also eating in room when nurse went in). Asymptomatic per caretaker and per patient. They both state that he is compliant with medications though caretaker has no idea what they are. She is unsure why he is on propranalol and lisinopril thought both deny dyspnea or cough.  Reported to have quit smokng in 1998 but surprinngly caretaker admitted that patient smoked "a little" in June 2012. Apparently he has quit since being reprimanded  OV 09/25/11: Patient comes in today for an acute sick visit.  He is usually followed for COPD, and unfortunately has been smoking since June of last year.  He gives a two-week history of rhinorrhea without purulence, a sore throat, as well as a cough with white mucus.  He does not have chest congestion, nor does he feel his breathing is worse than his usual baseline.  He notes significant postnasal drip.  REC Get  chlorpheniramine 12m over the counter, and take at bedtime and lunch for a week to see if runny nose/cough gets better  Can take robitussin dm over the counter for the cough is needed.  Use saline nasal spray for "nasal soreness", congestion.  You must stop smoking 100% if you want to stay well.  Keep followup apptm with Dr. RChase Caller  OV 11/14/2011 PMD SMaximino Greenland MD  Followup COPD,  Smoking and multifactorial cough  - COPD - reports stability. CAT score is 6 and reflects minimal symptom burden. Overall stable. Compliant with medications; Tough time reconciling meds because his caretaker BKatina Degreeis quite unhelpful. WE had to call pharmacy. Noted he is on non specific beta blocker; [facility] Unclear why he is getting this med. We have addressed this with caretaker in past but with no success  - SMoking - he is smoking despite ban on smoking at his facility. Knows he needs to quit but unable to. CAretaker knows he smokes as well  - Cough - some mild residual cough despite copd Rx +. He is on ace inhibitor and per caretaker PMD considering stopping this. No asspociated sputum. Cough is stable    Past, Family, Social reviewed: no change since last visit  REC #COPD - it is stable  - please continue your current inahler regimen  - please get Dr [facility]Edmonia Lynch  [facility] to stop your propranolol in presence of copd  #Cough  - please get Dr Glendale Chard to stop your lisinopril because this is making cough worse  - she can do some other medication for high bp  #Smoking  Please workn on quitting smoking  #Followup  - 3 months or sooner if needed   OV 03/12/2012  Followup COPD,  Smoking and multifactorial cough  - COPD - reports stability.  Denies cough and dyspnea. Feels fine. Compliant with spiriva and albuterol prn but using albuterol twice daily out of habit though asymptomatic. Marland Kitchen CAT score is 9 and reflects minimal  symptom burden; and all sym,ptoms due to nocturia (sleep quality being affected because of nocturia) and fatigue Overall stable. Compliant with medications; We noted that he is off propranalool. Careetaker Katina Degree has confirmed this. Instead on serouquel by DR Theda Sers [facility]  - COugh - resolved. He is off lisinopril and propranolol   - SMoking - he is smoking despite ban on smoking at his facility. Knows he needs to quit but unable to. CAretaker knows he smokes as well. Unable to quit. He is interested in gaining weigght though PMD has advised weight loss. I told him he could gain weight if he quit smoking and he is interested but still says it will be real hard to quit smoking.      08/06/2012 Acute OV  Complains of nasal congestion,dry cough,soreness on left side of chest x 1 wk.,no fcs,no sob, no wheezing,nose yellow and green discharge OTC mucinex not helpign  Cough is keeping him up at night  No fever, recent abx or travel.  No chest pain , hemoptysis or weight loss.    Doxycycline 131m Twice daily For 7 days -take w/ food  Mucinex DM Twice daily As needed Cough/congestion .  Claritin 171mAt bedtime As needed Drainage  Fluids and rest  Hydromet 1-2 tsp every 4-6 hr As needed Cough , may make you sleepy  Please contact office for sooner follow up if symptoms do not improve or worsen or seek emergency care  Follow up Dr. RaChase Callern 6 weeks and As needed     OV 10/07/2012. Followup COPD and smoking  - Overall reports stability in shortness of breath and cough. Only minimal symptoms. He says he is doing well. He had an exacerbation that was treated in the office in February 2014 but currently he is over it. He is compliant with Spiriva. His caretaker is not with him today  - Smoking he continues to smoke 3 cigarettes a day. He knows that he needs to quit and he wants help. He has anxiety and depression and is taking anxiolytics, antidepressants and antiparkinsonian  drugs and anticholinergics [basically a lot of medications on the beer list]. He says nicotine patch does not work. He denies any suicidal ideations or frank depressive symptoms. He is under the care of a psychiatrist  - Lung cancer screening: We discussed this and he cannot output $300 out of pocket  REC  #smoking STart wellbutrin or zyban only other direction of psychiatrist because this is high risk for me to prescribe given your history of depression and anxiety   #COPD - Continue daily Spiriva - Have flu shot in the fall  #Lung cancer screening - If you are interested in this, it will cost you $300 out of pocket  #Followup - 2 months to review quit smoking and COPD  OV 12/02/2012  LaArnette Norrisrontis presents  with his caretaker to review his COPD, smoking and lung cancer screening  - His COPD cat score is 2 times worse than baseline [see below] but in talking to him he reports stable COPD is symptoms  -In terms of smoking: He and his caretaker state that they could not get hold of his psychiatrist at [facility] and start either Wellbutrin or Zyban. However he was extremely keen to quit smoking due to fear of cancer and therefore he quit smoking couple weeks ago. However, risk paranoid schizophrenia is worse since he quit smoking  - In terms of lung cancer screening: He is extremely paranoid is lung cancer. He is keen to have the CT scan of the chest he said he can take $300 out of pocket but only in monthly installment     #SMoking  - glad you quit  #Schizophrenia  - this could be worse due to quitting smoking; call your psychiatrist ASAP  #COPD  -stable  - continue ihalers  #Lung cancer screen  - meet with our [facility] to see if you can have cancer screen CT chest paying installments  #Followup  2-3 months  COPD CAt score at followup   OV 03/04/2013 Followup - COPD: He was at his dyspnea at baseline cough is stable. He is compliant with his inhalers. There is no history of  any exacerbation or admissions the visits to the emergency room or urgent care. He satisfied with his quality-of-life. COPD cat score was not done today  - Smoking: At last visit in June 2014 his smoking was in remission but his schizophrenia got worse. He saw a [facility] and his medications have been adjusted and his schizophrenia is improved but he relapsed back into smoking but he says that for the past one week he is quit he did he thinks he will remain quit  - Lung cancer screen: This is on hold due to money issues  OV 09/02/2013  Chief Complaint  Patient presents with  . COPD    follow-up. Pt states he has an occasional dry cough.     COPD: Just finished cipro for burning sensation in stomach. COPD is stable though 2 days ago had some increased cough but now baseline.  Rx spiriva. Feels well. COPD CAT score 12 and baseline. No new issues. LAst pneumovax 2012 but caretaker Ms Diamantina Monks thinks he got booster in 2014 through pcp. Ms sMal thinks last few to several months there is more fatigue. Not done Vit D. Not done rehab in years   SMoking:  reports that he quit smoking about 3 weeks ago. His smoking use included Cigarettes. He has a 27 pack-year smoking history. He has never used smokeless tobacco.  Lung cancer screen: CT chest LDCT June 2014: no nodules.   Past, Family, Social reviewed: no change since last visit. BAseline schizophrenia with auditory hallucinations continue    CAT COPD Symptom and Quality of Life Score ([employer])  0 ->5 11/14/11 03/12/2012  12/02/2012  09/02/2013   Never Cough -> Cough all the time 2 0 3 3  No phlegm in chest -> Chest is full of phlegm 0 0 0 0  No chest tightness -> Chest feels very tight 0 0 0 0  No dyspnea for 1 flight stairs/hill -> Very dyspneic for 1 flight of stairs 00 0 3 5  No limitations for ADL at home -> Very limited with ADL at home 0 0 3 0  Confident leaving home ->  Not at all confident leaving home  0 0 5 0  Sleep soundly -> Do not sleep soundly because of lung condition 0 4 0 0  Lots of Energy -> No energy at all 4 5 4 4   TOTAL Score (max 40)  6 9 18 12     Review of Systems  Constitutional: Negative for fever and unexpected weight change.  HENT: Negative for congestion, dental problem, ear pain, nosebleeds, postnasal drip, rhinorrhea, sinus pressure, sneezing, sore throat and trouble swallowing.   Eyes: Negative for redness and itching.  Respiratory: Positive for cough. Negative for chest tightness, shortness of breath and wheezing.   Cardiovascular: Negative for palpitations and leg swelling.  Gastrointestinal: Negative for nausea and vomiting.  Genitourinary: Negative for dysuria.  Musculoskeletal: Negative for joint swelling.  Skin: Negative for rash.  Neurological: Negative for headaches.  Hematological: Does not bruise/bleed easily.  Psychiatric/Behavioral: Negative for dysphoric mood. The patient is not nervous/anxious.        Objective:   Physical Exam  Nursing note and vitals reviewed. Constitutional: He is oriented to person, place, and time. He appears well-developed and well-nourished. No distress.  HENT:  Head: Normocephalic and atraumatic.  Right Ear: External ear normal.  Left Ear: External ear normal.  Mouth/Throat: Oropharynx is clear and moist. No oropharyngeal exudate.  Eyes: Conjunctivae and EOM are normal. Pupils are equal, round, and reactive to light. Right eye exhibits no discharge. Left eye exhibits no discharge. No scleral icterus.  Neck: Normal range of motion. Neck supple. No JVD present. No tracheal deviation present. No thyromegaly present.  Cardiovascular: Normal rate, regular rhythm and intact distal pulses.  Exam reveals no gallop and no friction rub.   No murmur heard. Pulmonary/Chest: Effort normal and breath sounds normal. No respiratory distress. He has no wheezes. He has no rales. He exhibits no tenderness.  Abdominal: Soft. Bowel sounds  are normal. He exhibits no distension and no mass. There is no tenderness. There is no rebound and no guarding.  Musculoskeletal: Normal range of motion. He exhibits no edema and no tenderness.  Lymphadenopathy:    He has no cervical adenopathy.  Neurological: He is alert and oriented to person, place, and time. He has normal reflexes. No cranial nerve deficit. Coordination normal.  Skin: Skin is warm and dry. No rash noted. He is not diaphoretic. No erythema. No pallor.  Psychiatric: He has a normal mood and affect. His behavior is normal. Judgment and thought content normal.          Assessment & Plan:

## 2013-09-02 NOTE — Patient Instructions (Addendum)
#  Smoking  - please work on staying quit - congratulations on quitting  #COPD  - stable - continue inhaler spiriva - check with pcp SANDERS,ROBYN N, MD when she gave last pneumovax. WE gave it 2010. NEed to know if she gave booster now. Depending on that, have to time PREVNAR vaccine - repeat PFT test at time of followup - re-refer pulmonary rehab   #FAtigue  - check TSH and Vit D levels  #lung cancer screen - next one summer 2015 if you can afford $300. Will discuss at that time  #Followup 4 months or sooner if needed PFT test to be done at time of followup

## 2013-09-03 LAB — VITAMIN D 25 HYDROXY (VIT D DEFICIENCY, FRACTURES): Vit D, 25-Hydroxy: 42 ng/mL (ref 30–89)

## 2013-09-04 ENCOUNTER — Ambulatory Visit
Admission: RE | Admit: 2013-09-04 | Discharge: 2013-09-04 | Disposition: A | Discharge status: Home / Self Care | Payer: Medicare Other | Source: Ambulatory Visit | Attending: Internal Medicine | Admitting: Internal Medicine

## 2013-09-04 ENCOUNTER — Other Ambulatory Visit: Payer: Self-pay | Admitting: Internal Medicine

## 2013-09-04 DIAGNOSIS — R52 Pain, unspecified: Secondary | ICD-10-CM

## 2013-09-04 DIAGNOSIS — R609 Edema, unspecified: Secondary | ICD-10-CM

## 2013-09-04 IMAGING — US US EXTREM LOW VENOUS*L*
1 series · 14 of 24 positions shown · non-contrast
Comparison: None.

CLINICAL DATA: Left lower extremity pain and edema. History of
prior right lower extremity DVT in [SV].

EXAM:
LEFT LOWER EXTREMITY VENOUS DOPPLER ULTRASOUND
TECHNIQUE: Gray-scale sonography with graded compression, as well as color
Doppler and duplex ultrasound, were performed to evaluate the deep
venous system from the level of the common femoral vein through the
popliteal and proximal calf veins. Spectral Doppler was utilized to
evaluate flow at rest and with distal augmentation maneuvers.

[Series 1: us extrem low venous*left* · 14 of 35 slices shown]
[im 1/35]
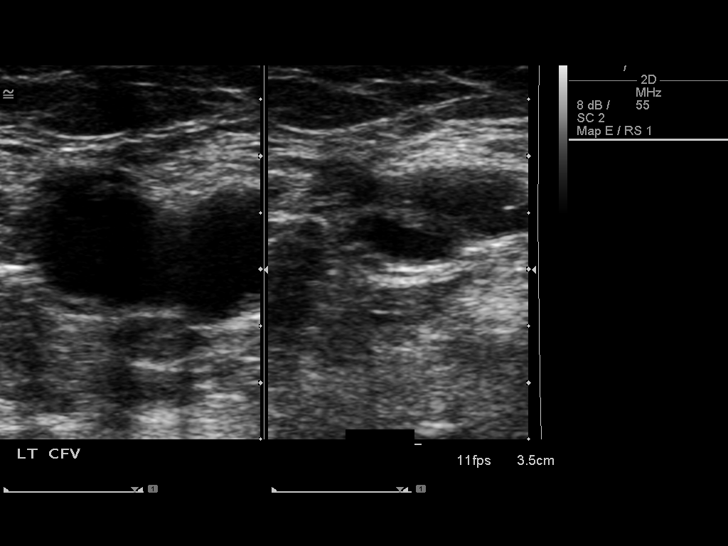
[im 3/35]
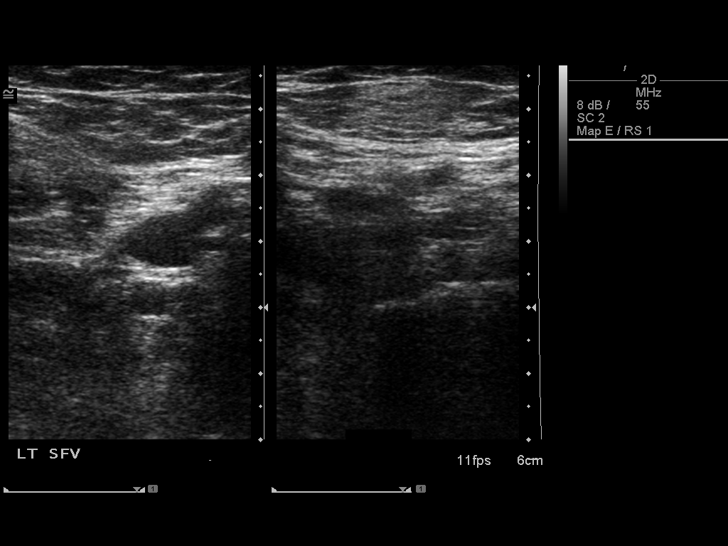
[im 6/35]
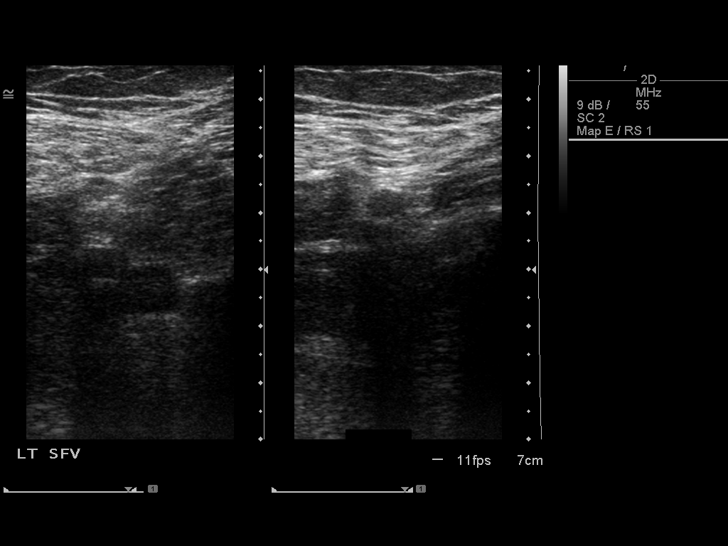
[im 9/35]
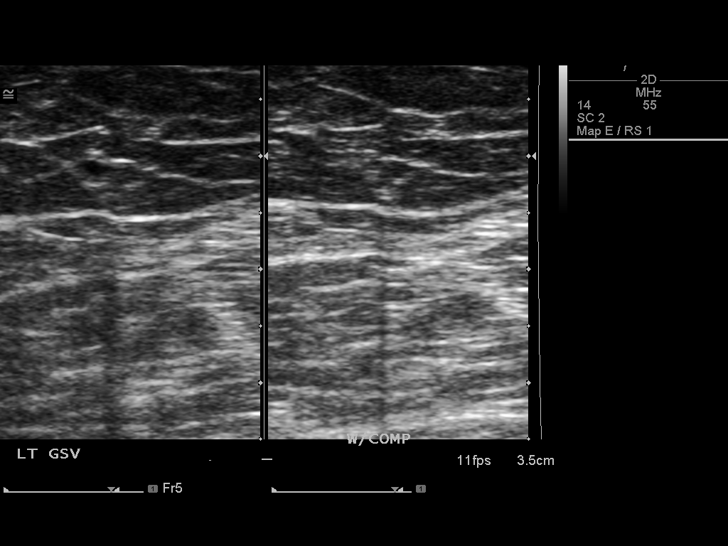
[im 11/35]
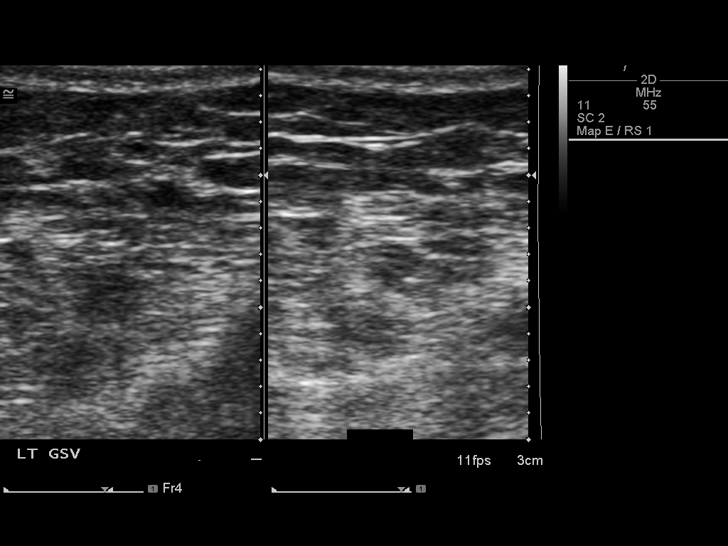
[im 14/35]
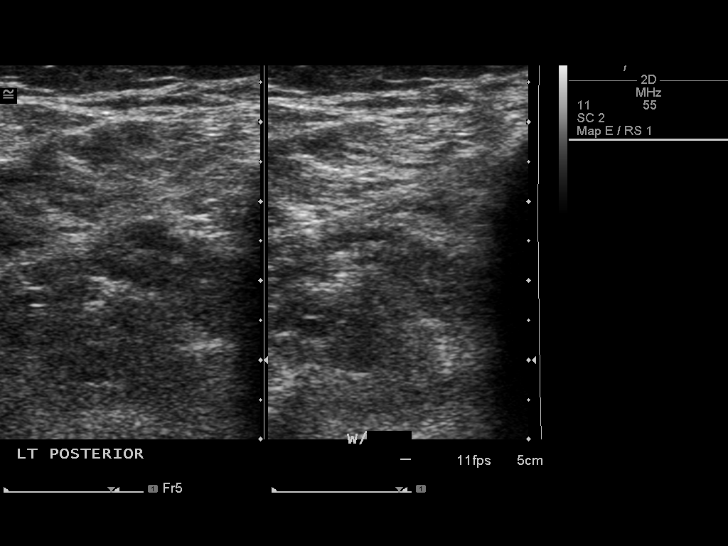
[im 17/35]
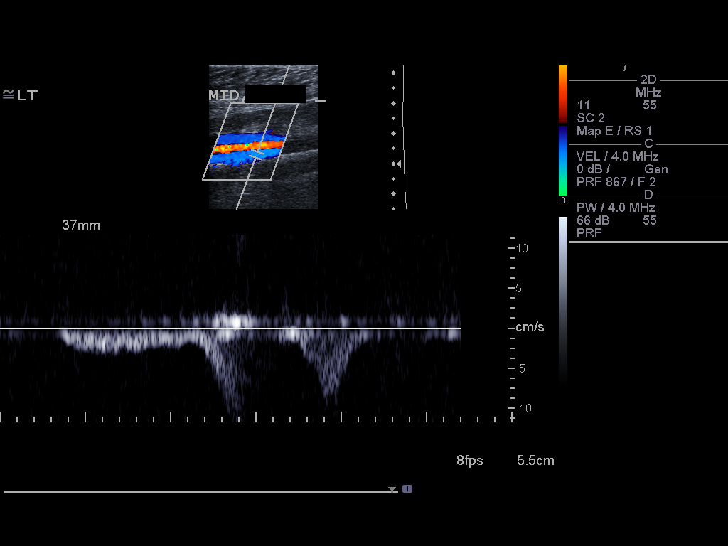
[im 18/35]
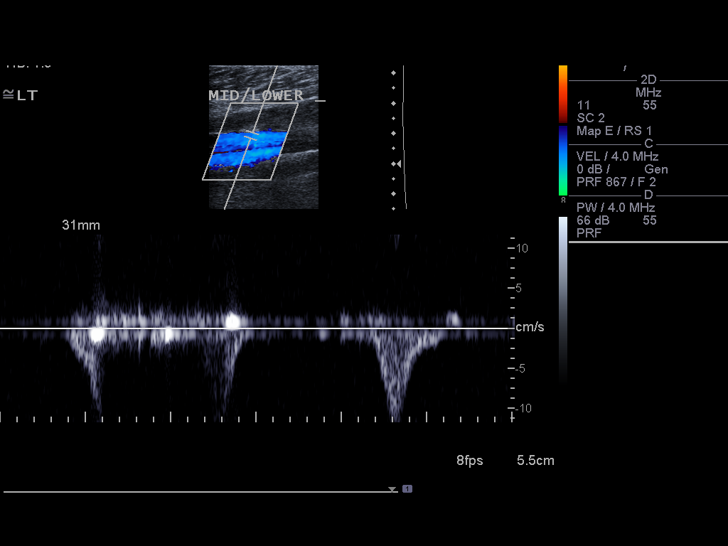
[im 21/35]
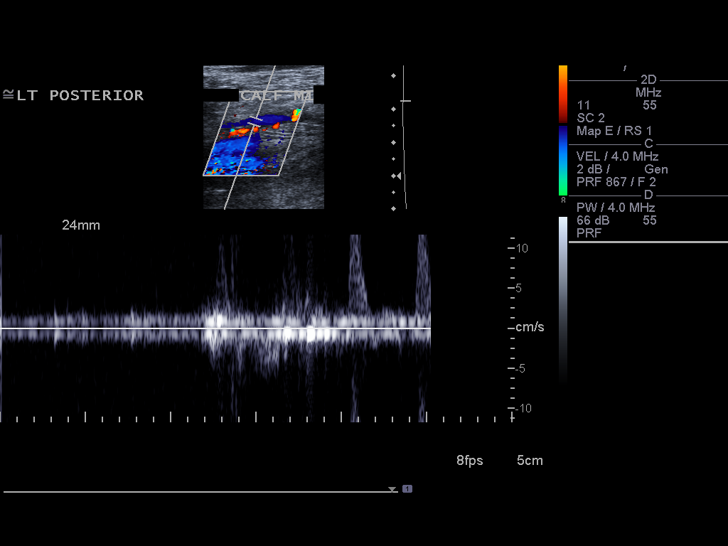
[im 24/35]
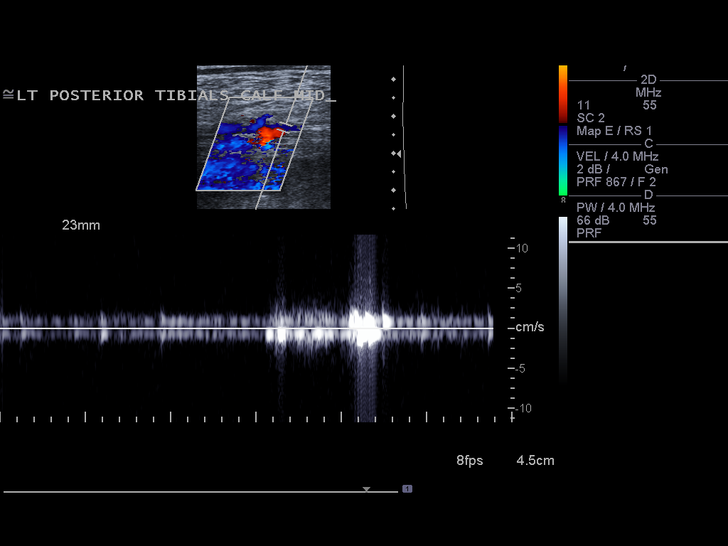
[im 27/35]
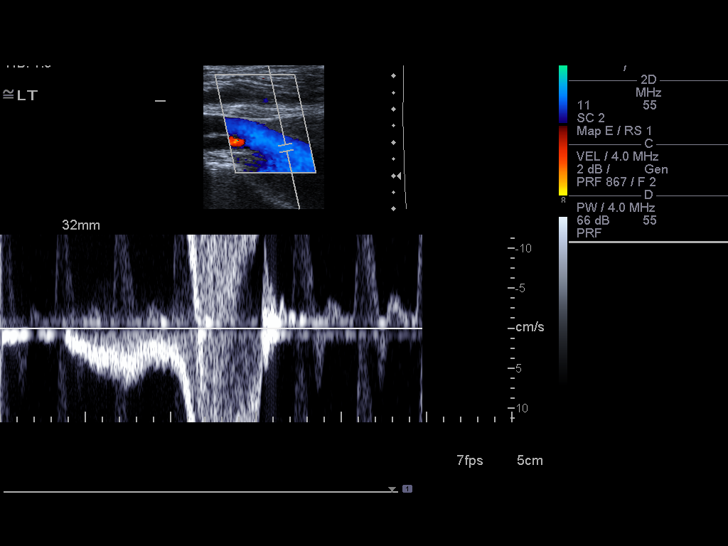
[im 29/35]
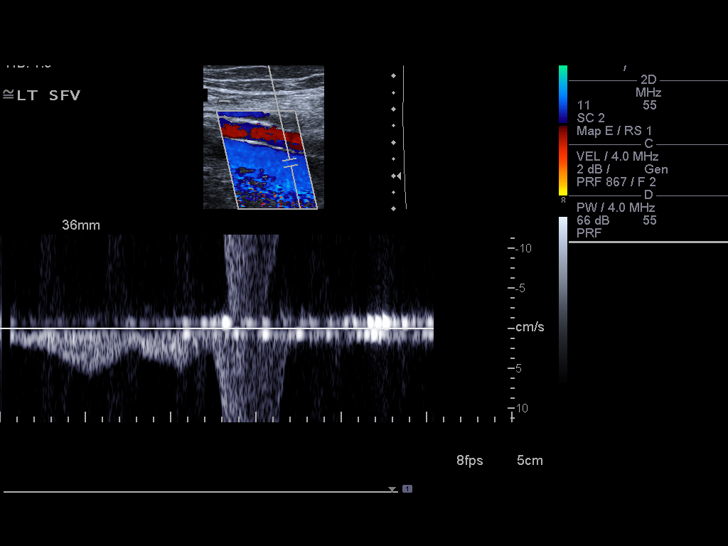
[im 32/35]
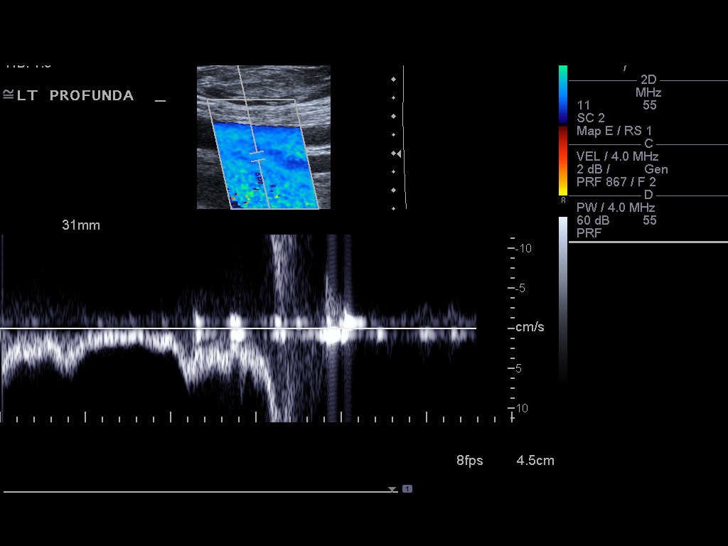
[im 35/35]
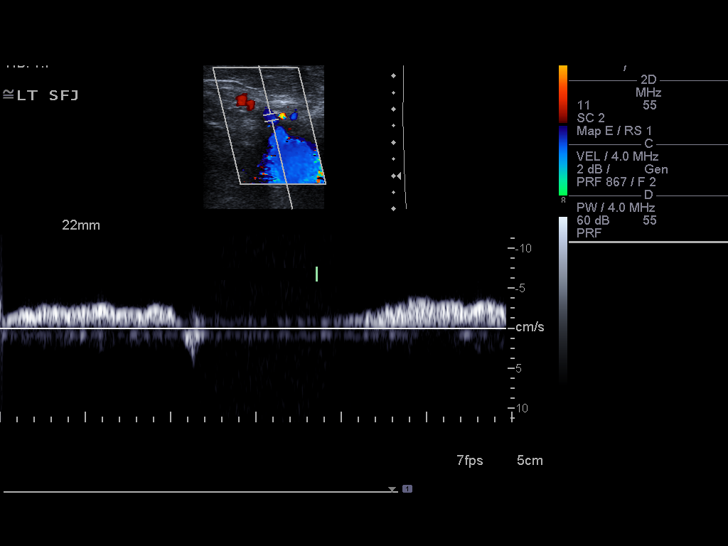

[14 of 24 positions shown; findings below may reference images not displayed]

FINDINGS: Thrombus within deep veins:  None visualized.

Compressibility of deep veins:  Normal.

Duplex waveform respiratory phasicity:  Normal.

Duplex waveform response to augmentation:  Normal.

Venous reflux:  None visualized.

Other findings: No evidence of superficial thrombophlebitis or
abnormal fluid collection.
IMPRESSION: No evidence of left lower extremity DVT.

## 2013-09-06 DIAGNOSIS — R5383 Other fatigue: Secondary | ICD-10-CM

## 2013-09-06 DIAGNOSIS — R5381 Other malaise: Secondary | ICD-10-CM | POA: Insufficient documentation

## 2013-09-06 NOTE — Assessment & Plan Note (Addendum)
#  Smoking - finally quit but at high risk of relapse due to schizophrenia  - please work on staying quit, dangers of smoking emphasized - congratulations on quitting    3 min face to face counseling

## 2013-09-06 NOTE — Assessment & Plan Note (Signed)
 #  FAtigue  - check TSH and Vit D levels

## 2013-09-06 NOTE — Assessment & Plan Note (Signed)
#  lung cancer screen - next one summer 2015 if you can afford $300. Will discuss at that time

## 2013-09-06 NOTE — Assessment & Plan Note (Signed)
#  COPD  - stable - continue inhaler spiriva - check with pcp SANDERS,ROBYN N, MD when she gave last pneumovax. WE gave it 2010. NEed to know if she gave booster now. Depending on that, have to time PREVNAR vaccine - repeat PFT test at time of followup - re-refer pulmonary rehab   #FAtigue  - check TSH and Vit D levels   #Followup 4 months or sooner if needed PFT test to be done at time of followup

## 2013-09-11 ENCOUNTER — Telehealth (HOSPITAL_COMMUNITY): Payer: Self-pay | Admitting: *Deleted

## 2013-09-11 NOTE — Telephone Encounter (Signed)
Telephone call placed to patient regarding referral to Pulmonary Rehab.  Spoke with his friend, stated he was not ready to start yet. Call back later.  Cathie Olden RN

## 2014-02-03 ENCOUNTER — Encounter (HOSPITAL_COMMUNITY): Payer: Self-pay | Admitting: Emergency Medicine

## 2014-02-03 ENCOUNTER — Emergency Department (HOSPITAL_COMMUNITY)
Admission: EM | Admit: 2014-02-03 | Discharge: 2014-02-03 | Disposition: A | Discharge status: Home / Self Care | Payer: Medicare Other | Attending: Emergency Medicine | Admitting: Emergency Medicine

## 2014-02-03 ENCOUNTER — Emergency Department (HOSPITAL_COMMUNITY): Payer: Medicare Other

## 2014-02-03 DIAGNOSIS — X58XXXA Exposure to other specified factors, initial encounter: Secondary | ICD-10-CM | POA: Diagnosis not present

## 2014-02-03 DIAGNOSIS — Z79899 Other long term (current) drug therapy: Secondary | ICD-10-CM | POA: Diagnosis not present

## 2014-02-03 DIAGNOSIS — J449 Chronic obstructive pulmonary disease, unspecified: Secondary | ICD-10-CM | POA: Insufficient documentation

## 2014-02-03 DIAGNOSIS — Y939 Activity, unspecified: Secondary | ICD-10-CM | POA: Diagnosis not present

## 2014-02-03 DIAGNOSIS — Z880 Allergy status to penicillin: Secondary | ICD-10-CM | POA: Diagnosis not present

## 2014-02-03 DIAGNOSIS — E785 Hyperlipidemia, unspecified: Secondary | ICD-10-CM | POA: Insufficient documentation

## 2014-02-03 DIAGNOSIS — K589 Irritable bowel syndrome without diarrhea: Secondary | ICD-10-CM | POA: Diagnosis not present

## 2014-02-03 DIAGNOSIS — R1084 Generalized abdominal pain: Secondary | ICD-10-CM | POA: Diagnosis not present

## 2014-02-03 DIAGNOSIS — F200 Paranoid schizophrenia: Secondary | ICD-10-CM | POA: Insufficient documentation

## 2014-02-03 DIAGNOSIS — Y929 Unspecified place or not applicable: Secondary | ICD-10-CM | POA: Diagnosis not present

## 2014-02-03 DIAGNOSIS — S161XXA Strain of muscle, fascia and tendon at neck level, initial encounter: Secondary | ICD-10-CM

## 2014-02-03 DIAGNOSIS — M542 Cervicalgia: Secondary | ICD-10-CM | POA: Insufficient documentation

## 2014-02-03 DIAGNOSIS — J4489 Other specified chronic obstructive pulmonary disease: Secondary | ICD-10-CM | POA: Insufficient documentation

## 2014-02-03 DIAGNOSIS — Z8701 Personal history of pneumonia (recurrent): Secondary | ICD-10-CM | POA: Insufficient documentation

## 2014-02-03 DIAGNOSIS — Z792 Long term (current) use of antibiotics: Secondary | ICD-10-CM | POA: Insufficient documentation

## 2014-02-03 DIAGNOSIS — S139XXA Sprain of joints and ligaments of unspecified parts of neck, initial encounter: Secondary | ICD-10-CM | POA: Diagnosis not present

## 2014-02-03 DIAGNOSIS — I10 Essential (primary) hypertension: Secondary | ICD-10-CM | POA: Insufficient documentation

## 2014-02-03 DIAGNOSIS — Z87891 Personal history of nicotine dependence: Secondary | ICD-10-CM | POA: Insufficient documentation

## 2014-02-03 LAB — COMPREHENSIVE METABOLIC PANEL
ALT: 30 U/L (ref 0–53)
AST: 39 U/L — ABNORMAL HIGH (ref 0–37)
Albumin: 4 g/dL (ref 3.5–5.2)
Alkaline Phosphatase: 34 U/L — ABNORMAL LOW (ref 39–117)
Anion gap: 12 (ref 5–15)
BUN: 11 mg/dL (ref 6–23)
CO2: 26 mEq/L (ref 19–32)
Calcium: 9.6 mg/dL (ref 8.4–10.5)
Chloride: 99 mEq/L (ref 96–112)
Creatinine, Ser: 1.21 mg/dL (ref 0.50–1.35)
GFR calc Af Amer: 73 mL/min — ABNORMAL LOW (ref >90)
GFR calc non Af Amer: 63 mL/min — ABNORMAL LOW (ref >90)
Glucose, Bld: 97 mg/dL (ref 70–99)
Potassium: 4.5 mEq/L (ref 3.7–5.3)
Sodium: 137 mEq/L (ref 137–147)
Total Bilirubin: 0.7 mg/dL (ref 0.3–1.2)
Total Protein: 7.8 g/dL (ref 6.0–8.3)

## 2014-02-03 LAB — CBC WITH DIFFERENTIAL/PLATELET
Basophils Absolute: 0 10*3/uL (ref 0.0–0.1)
Basophils Relative: 0 % (ref 0–1)
Eosinophils Absolute: 0 10*3/uL (ref 0.0–0.7)
Eosinophils Relative: 0 % (ref 0–5)
HCT: 33.5 % — ABNORMAL LOW (ref 39.0–52.0)
Hemoglobin: 12.1 g/dL — ABNORMAL LOW (ref 13.0–17.0)
Lymphocytes Relative: 23 % (ref 12–46)
Lymphs Abs: 1.1 10*3/uL (ref 0.7–4.0)
MCH: 32.9 pg (ref 26.0–34.0)
MCHC: 36.1 g/dL — ABNORMAL HIGH (ref 30.0–36.0)
MCV: 91 fL (ref 78.0–100.0)
Monocytes Absolute: 0.2 10*3/uL (ref 0.1–1.0)
Monocytes Relative: 5 % (ref 3–12)
Neutro Abs: 3.6 10*3/uL (ref 1.7–7.7)
Neutrophils Relative %: 72 % (ref 43–77)
Platelets: 238 10*3/uL (ref 150–400)
RBC: 3.68 MIL/uL — ABNORMAL LOW (ref 4.22–5.81)
RDW: 15.2 % (ref 11.5–15.5)
WBC: 5 10*3/uL (ref 4.0–10.5)

## 2014-02-03 LAB — URINALYSIS, ROUTINE W REFLEX MICROSCOPIC
Glucose, UA: NEGATIVE mg/dL
Hgb urine dipstick: NEGATIVE
Ketones, ur: NEGATIVE mg/dL
Leukocytes, UA: NEGATIVE
Nitrite: NEGATIVE
Protein, ur: NEGATIVE mg/dL
Specific Gravity, Urine: 1.028 (ref 1.005–1.030)
Urobilinogen, UA: 1 mg/dL (ref 0.0–1.0)
pH: 5 (ref 5.0–8.0)

## 2014-02-03 LAB — LIPASE, BLOOD: Lipase: 29 U/L (ref 11–59)

## 2014-02-03 IMAGING — CT CT ABD-PELV W/ CM
1 of 3 series · 14 of 32 positions shown, 19 images · IV contrast (OMNIPAQUE 300)
Comparison: [DATE]

CLINICAL DATA: Right-sided abdominal pain. Nausea and vomiting.
Crohn's disease. Multiple previous bowel resections pre

EXAM:
CT ABDOMEN AND PELVIS WITH CONTRAST
TECHNIQUE: Multidetector CT imaging of the abdomen and pelvis was performed
using the standard protocol following bolus administration of
intravenous contrast.
CONTRAST:  100mL OMNIPAQUE IOHEXOL 300 MG/ML  SOLN

[Series 2: abd/pel with · axial · 0.78mm/px · z∈[+991,+1386]mm · 14 of 89 slices shown, 19 images]
[im 5/89  soft-tissue]
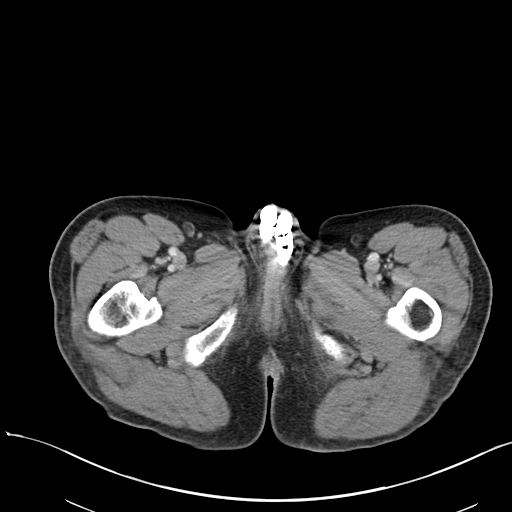
[im 5/89  bone]
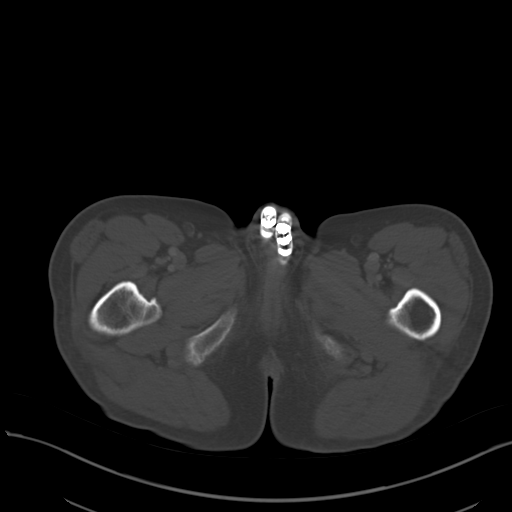
[im 14/89  soft-tissue]
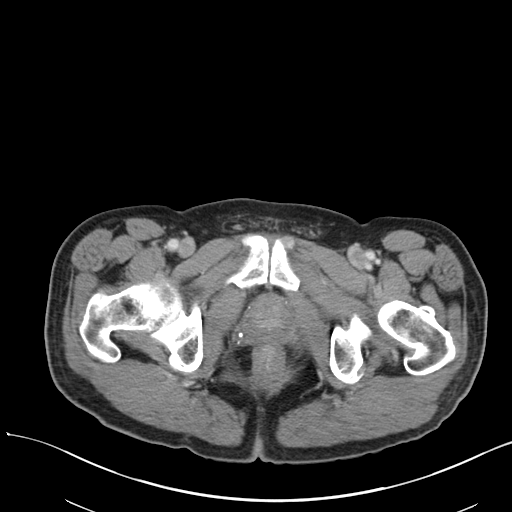
[im 19/89  soft-tissue]
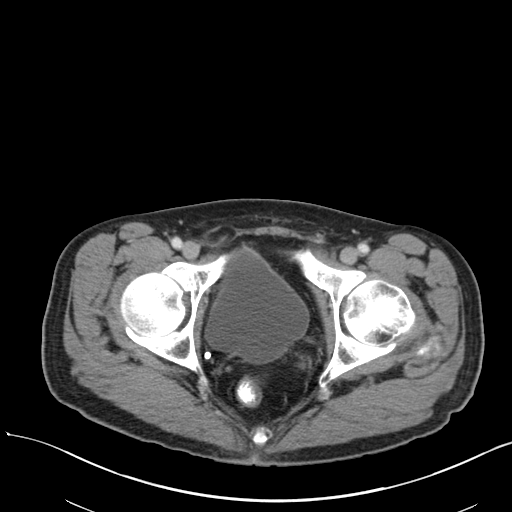
[im 24/89  soft-tissue]
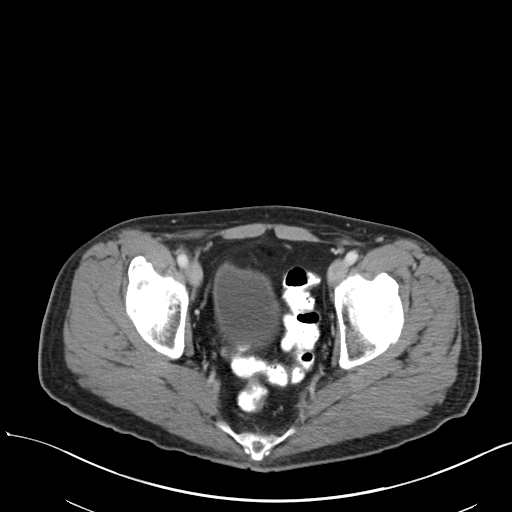
[im 33/89  soft-tissue]
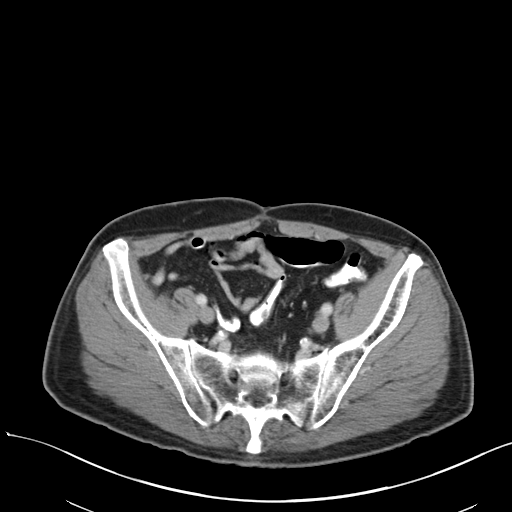
[im 38/89  soft-tissue]
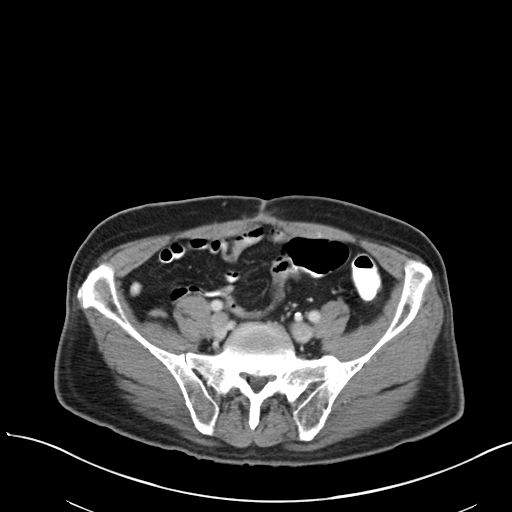
[im 47/89  soft-tissue]
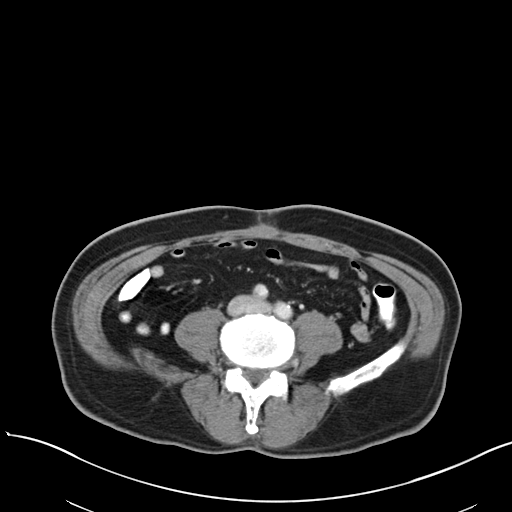
[im 51/89  soft-tissue]
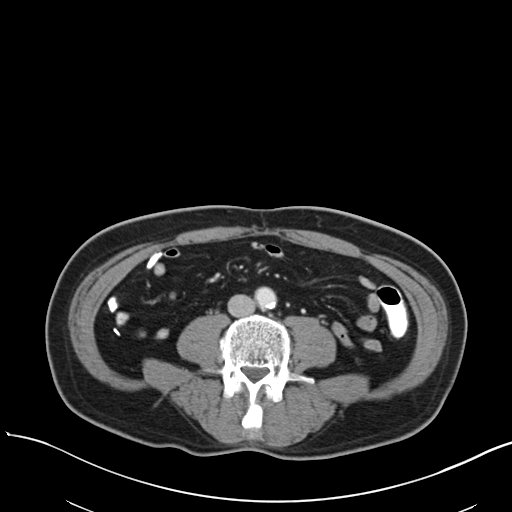
[im 56/89  soft-tissue]
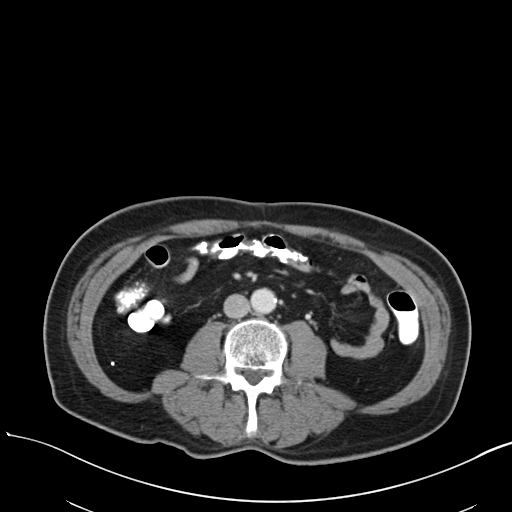
[im 56/89  bone]
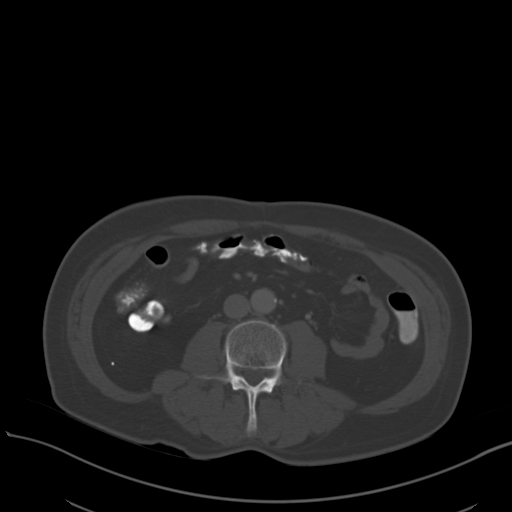
[im 65/89  soft-tissue]
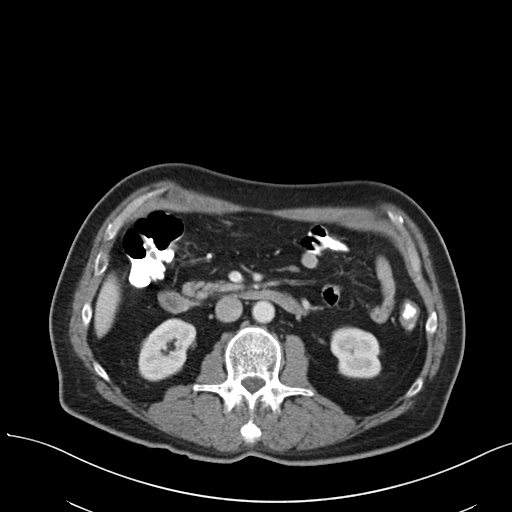
[im 70/89  soft-tissue]
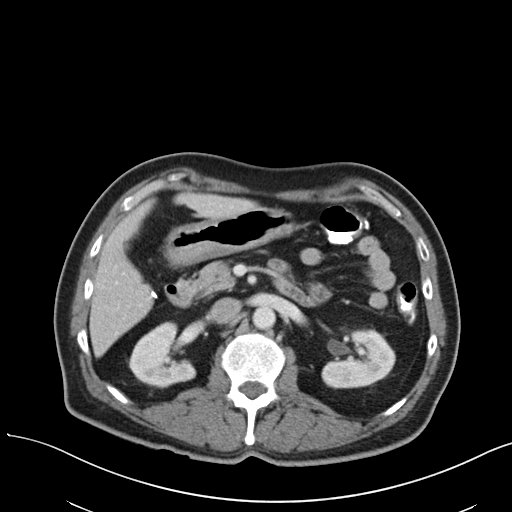
[im 70/89  lung]
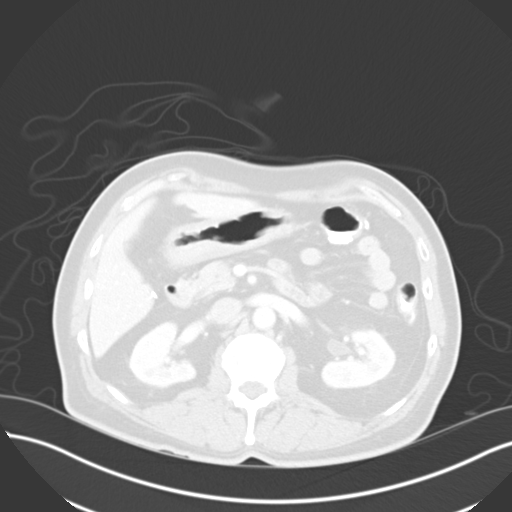
[im 75/89  soft-tissue]
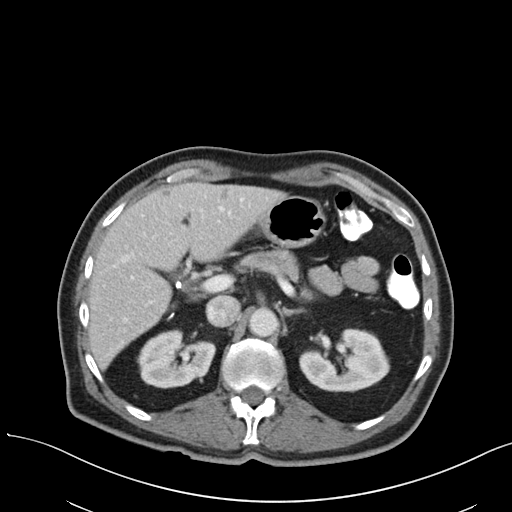
[im 75/89  lung]
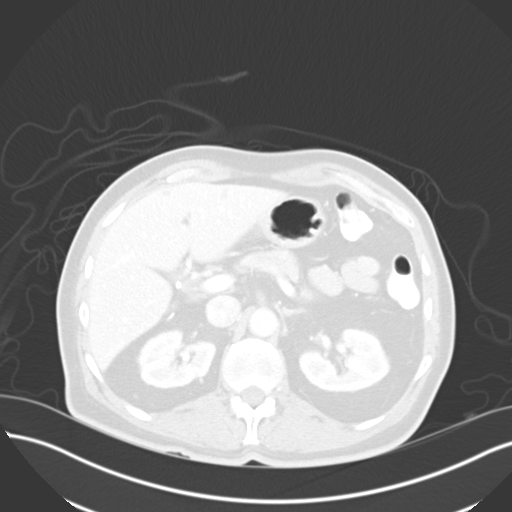
[im 79/89  lung]
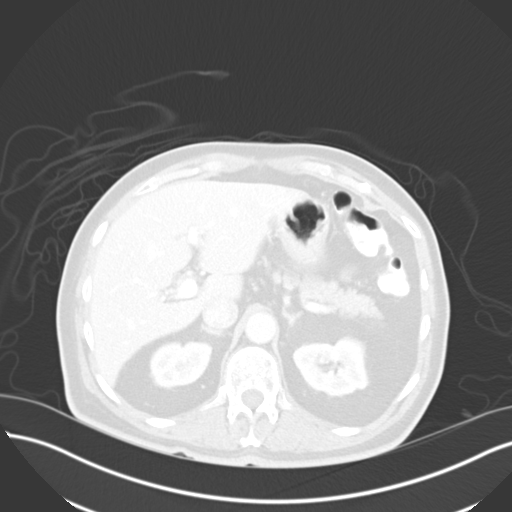
[im 84/89  soft-tissue]
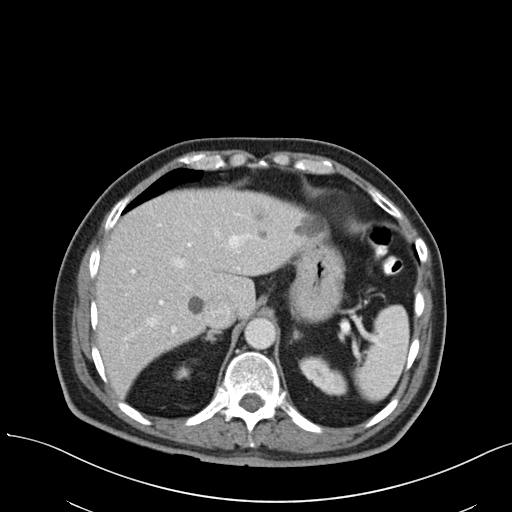
[im 84/89  lung]
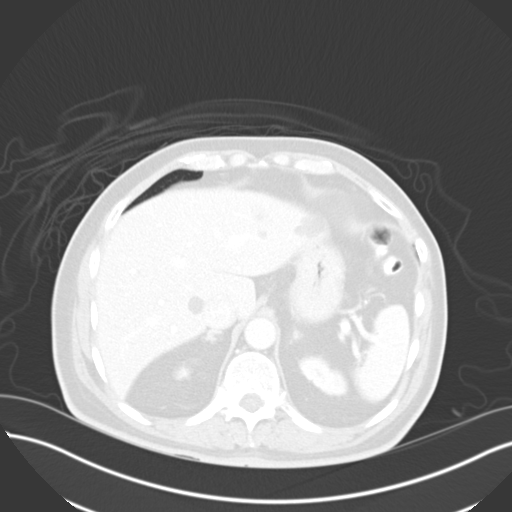

[14 of 32 positions shown; findings below may reference images not displayed]

FINDINGS: Liver: Multiple small hepatic cysts are stable. No liver masses are
identified.

Gallbladder/Biliary: Surgical clips from prior cholecystectomy
noted. No evidence of biliary ductal dilatation.

Pancreas: No mass, inflammatory changes, or other parenchymal
abnormality identified.

Spleen:  Within normal limits in size and appearance.

Adrenal Glands:  No mass identified.

Kidneys/Urinary Tract: No masses identified. No evidence of
hydronephrosis.

Lymph Nodes:  No pathologically enlarged lymph nodes identified.

Bowel: No evidence of wall thickening, mass, or obstruction.
Surgical clips noted in right lower quadrant.

Pelvic/Reproductive Organs: No mass or other significant abnormality
identified. Penile prosthesis noted.

Vascular:  No evidence of abdominal aortic aneurysm.

Musculoskeletal: No suspicious bone lesions identified. Stable
sclerotic bone lesion again noted the left sacrum, consistent with
benign etiology.

Other:  None.
IMPRESSION: Stable hepatic cysts. No acute findings or other significant
abnormality.

## 2014-02-03 MED ORDER — SUCRALFATE 1 G PO TABS: 1.0000 g | ORAL_TABLET | Freq: Three times a day (TID) | ORAL | Status: DC

## 2014-02-03 MED ORDER — FENTANYL CITRATE 0.05 MG/ML IJ SOLN
50.0000 ug | Freq: Once | INTRAMUSCULAR | Status: AC
  Administered 2014-02-03: 50 ug via INTRAVENOUS
  Filled 2014-02-03: qty 2

## 2014-02-03 MED ORDER — IOHEXOL 300 MG/ML  SOLN
100.0000 mL | Freq: Once | INTRAMUSCULAR | Status: AC | PRN
  Administered 2014-02-03: 100 mL via INTRAVENOUS

## 2014-02-03 MED ORDER — IOHEXOL 300 MG/ML  SOLN
50.0000 mL | Freq: Once | INTRAMUSCULAR | Status: AC | PRN
  Administered 2014-02-03: 50 mL via ORAL

## 2014-02-03 NOTE — ED Notes (Signed)
Patient ok to eat per Dr Gilmore Laroche. Patient given food brought in by family.

## 2014-02-03 NOTE — Discharge Instructions (Signed)
Cervical Sprain °A cervical sprain is an injury in the neck in which the strong, fibrous tissues (ligaments) that connect your neck bones stretch or tear. Cervical sprains can range from mild to severe. Severe cervical sprains can cause the neck vertebrae to be unstable. This can lead to damage of the spinal cord and can result in serious nervous system problems. The amount of time it takes for a cervical sprain to get better depends on the cause and extent of the injury. Most cervical sprains heal in 1 to 3 weeks. °CAUSES  °Severe cervical sprains may be caused by:  °· Contact sport injuries (such as from football, rugby, wrestling, hockey, auto racing, gymnastics, diving, martial arts, or boxing).   °· Motor vehicle collisions.   °· Whiplash injuries. This is an injury from a sudden forward and backward whipping movement of the head and neck.  °· Falls.   °Mild cervical sprains may be caused by:  °· Being in an awkward position, such as while cradling a telephone between your ear and shoulder.   °· Sitting in a chair that does not offer proper support.   °· Working at a poorly designed computer station.   °· Looking up or down for long periods of time.   °SYMPTOMS  °· Pain, soreness, stiffness, or a burning sensation in the front, back, or sides of the neck. This discomfort may develop immediately after the injury or slowly, 24 hours or more after the injury.   °· Pain or tenderness directly in the middle of the back of the neck.   °· Shoulder or upper back pain.   °· Limited ability to move the neck.   °· Headache.   °· Dizziness.   °· Weakness, numbness, or tingling in the hands or arms.   °· Muscle spasms.   °· Difficulty swallowing or chewing.   °· Tenderness and swelling of the neck.   °DIAGNOSIS  °Most of the time your health care provider can diagnose a cervical sprain by taking your history and doing a physical exam. Your health care provider will ask about previous neck injuries and any known neck  problems, such as arthritis in the neck. X-rays may be taken to find out if there are any other problems, such as with the bones of the neck. Other tests, such as a CT scan or MRI, may also be needed.  °TREATMENT  °Treatment depends on the severity of the cervical sprain. Mild sprains can be treated with rest, keeping the neck in place (immobilization), and pain medicines. Severe cervical sprains are immediately immobilized. Further treatment is done to help with pain, muscle spasms, and other symptoms and may include: °· Medicines, such as pain relievers, numbing medicines, or muscle relaxants.   °· Physical therapy. This may involve stretching exercises, strengthening exercises, and posture training. Exercises and improved posture can help stabilize the neck, strengthen muscles, and help stop symptoms from returning.   °HOME CARE INSTRUCTIONS  °· Put ice on the injured area.   °¨ Put ice in a plastic bag.   °¨ Place a towel between your skin and the bag.   °¨ Leave the ice on for 15-20 minutes, 3-4 times a day.   °· If your injury was severe, you may have been given a cervical collar to wear. A cervical collar is a two-piece collar designed to keep your neck from moving while it heals. °¨ Do not remove the collar unless instructed by your health care provider. °¨ If you have long hair, keep it outside of the collar. °¨ Ask your health care provider before making any adjustments to your collar. Minor   adjustments may be required over time to improve comfort and reduce pressure on your chin or on the back of your head. °¨ If you are allowed to remove the collar for cleaning or bathing, follow your health care provider's instructions on how to do so safely. °¨ Keep your collar clean by wiping it with mild soap and water and drying it completely. If the collar you have been given includes removable pads, remove them every 1-2 days and hand wash them with soap and water. Allow them to air dry. They should be completely  dry before you wear them in the collar. °¨ If you are allowed to remove the collar for cleaning and bathing, wash and dry the skin of your neck. Check your skin for irritation or sores. If you see any, tell your health care provider. °¨ Do not drive while wearing the collar.   °· Only take over-the-counter or prescription medicines for pain, discomfort, or fever as directed by your health care provider.   °· Keep all follow-up appointments as directed by your health care provider.   °· Keep all physical therapy appointments as directed by your health care provider.   °· Make any needed adjustments to your workstation to promote good posture.   °· Avoid positions and activities that make your symptoms worse.   °· Warm up and stretch before being active to help prevent problems.   °SEEK MEDICAL CARE IF:  °· Your pain is not controlled with medicine.   °· You are unable to decrease your pain medicine over time as planned.   °· Your activity level is not improving as expected.   °SEEK IMMEDIATE MEDICAL CARE IF:  °· You develop any bleeding. °· You develop stomach upset. °· You have signs of an allergic reaction to your medicine.   °· Your symptoms get worse.   °· You develop new, unexplained symptoms.   °· You have numbness, tingling, weakness, or paralysis in any part of your body.   °MAKE SURE YOU:  °· Understand these instructions. °· Will watch your condition. °· Will get help right away if you are not doing well or get worse. °Document Released: 04/02/2007 Document Revised: 06/10/2013 Document Reviewed: 12/11/2012 °[employer]® Patient Information ©2015 [employer]. This information is not intended to replace advice given to you by your health care provider. Make sure you discuss any questions you have with your health care provider. ° °Abdominal Pain °Many things can cause abdominal pain. Usually, abdominal pain is not caused by a disease and will improve without treatment. It can often be observed and treated at  home. Your health care provider will do a physical exam and possibly order blood tests and X-rays to help determine the seriousness of your pain. However, in many cases, more time must pass before a clear cause of the pain can be found. Before that point, your health care provider may not know if you need more testing or further treatment. °HOME CARE INSTRUCTIONS  °Monitor your abdominal pain for any changes. The following actions may help to alleviate any discomfort you are experiencing: °· Only take over-the-counter or prescription medicines as directed by your health care provider. °· Do not take laxatives unless directed to do so by your health care provider. °· Try a clear liquid diet (broth, tea, or water) as directed by your health care provider. Slowly move to a bland diet as tolerated. °SEEK MEDICAL CARE IF: °· You have unexplained abdominal pain. °· You have abdominal pain associated with nausea or diarrhea. °· You have pain when you urinate or have a   bowel movement. °· You experience abdominal pain that wakes you in the night. °· You have abdominal pain that is worsened or improved by eating food. °· You have abdominal pain that is worsened with eating fatty foods. °· You have a fever. °SEEK IMMEDIATE MEDICAL CARE IF:  °· Your pain does not go away within 2 hours. °· You keep throwing up (vomiting). °· Your pain is felt only in portions of the abdomen, such as the right side or the left lower portion of the abdomen. °· You pass bloody or black tarry stools. °MAKE SURE YOU: °· Understand these instructions.   °· Will watch your condition.   °· Will get help right away if you are not doing well or get worse.   °Document Released: 03/15/2005 Document Revised: 06/10/2013 Document Reviewed: 02/12/2013 °[employer]® Patient Information ©2015 [employer]. This information is not intended to replace advice given to you by your health care provider. Make sure you discuss any questions you have with your health care  provider. ° °

## 2014-02-03 NOTE — ED Notes (Signed)
Patient reports having a history of crohn's disease. Patient states that he has had burning of the abdomen x 3 weeks. Patient states he was prescribed Cipro a week ago and feels the symptoms are no better. Patient also states that he has soreness of the right neck when palpated.

## 2014-02-03 NOTE — ED Provider Notes (Signed)
CSN: [id number]     Arrival date & time 02/03/14  1438 History   First MD Initiated Contact with Patient 02/03/14 1603     Chief Complaint  Patient presents with  . abdominal discomfor    . Neck Pain     (Consider location/radiation/quality/duration/timing/severity/associated sxs/prior Treatment) HPI Comments: Patient presents with abdominal pain. He has a history of Crohn's disease. He states she's had a three-week history of some burning type discomfort in his abdomen. It starts in his right lower abdomen and radiates up to the epigastrium. It does not seem to be related to eating other than if he spaced he foods it seems to make it a little bit worse. He states it does not feel like his typical Crohn's flare up. He's had loose stools but no overt diarrhea. He denies he nausea or vomiting. He has no fevers or chills. He's been using Cipro which was prescribed by his gastroenterologist in [facility] for about 4 days now with no improvement of symptoms. He denies any urinary symptoms. He has no back pain. He also complains of some burning to the right-sided his neck. It's worse with movement. He denies any runny nose or nasal congestion. He denies any injuries. He denies any radiation down his arm or numbness in his extremities. He states he has 2 young children at home who have had colds with ear infections and he is concerned he might have an ear infection.  Patient is a 58 y.o. male presenting with neck pain.  Neck Pain Associated symptoms: no chest pain, no fever, no headaches, no numbness and no weakness     Past Medical History  Diagnosis Date  . Dysphagia   . Crohn's disease   . IBS (irritable bowel syndrome)   . Hypertension   . Hyperlipidemia   . Allergic rhinitis   . Paranoid schizophrenia   . Glaucoma   . Peripheral vascular disease   . Dyspnea   . History of pneumonia   . Atelectasis   . COPD (chronic obstructive pulmonary disease)    Past Surgical History  Procedure  Laterality Date  . Cholecystectomy    . Colon surgery  1996   Family History  Problem Relation Age of Onset  . Hypertension Mother   . Heart disease Mother   . Hypertension Father   . Heart disease Father   . Lung cancer Brother     2006   History  Substance Use Topics  . Smoking status: Former Smoker -- 1.00 packs/day for 27 years    Types: Cigarettes    Quit date: 08/12/2013  . Smokeless tobacco: Never Used  . Alcohol Use: No    Review of Systems  Constitutional: Negative for fever, chills, diaphoresis and fatigue.  HENT: Negative for congestion, rhinorrhea and sneezing.   Eyes: Negative.   Respiratory: Negative for cough, chest tightness and shortness of breath.   Cardiovascular: Negative for chest pain and leg swelling.  Gastrointestinal: Positive for abdominal pain. Negative for nausea, vomiting, diarrhea and blood in stool.  Genitourinary: Negative for frequency, hematuria, flank pain and difficulty urinating.  Musculoskeletal: Positive for neck pain. Negative for arthralgias and back pain.  Skin: Negative for rash.  Neurological: Negative for dizziness, speech difficulty, weakness, numbness and headaches.      Allergies  Codeine; Penicillins; Sulfamethoxazole-trimethoprim; and Sulfonamide derivatives  Home Medications   Prior to Admission medications   Medication Sig Start Date End Date Taking? Authorizing Provider  albuterol (PROAIR HFA) 108 (90 BASE)  MCG/ACT inhaler INHALE 2 PUFFS INTO THE LUNGS EVERY 6 HOURS AS NEEDED 07/02/13  Yes Waymon Budge, MD  albuterol (PROVENTIL HFA;VENTOLIN HFA) 108 (90 BASE) MCG/ACT inhaler Inhale 2 puffs into the lungs every 6 (six) hours as needed for wheezing or shortness of breath.   Yes Historical Provider, MD  alfuzosin (UROXATRAL) 10 MG 24 hr tablet Take 10 mg by mouth at bedtime.    Yes Historical Provider, MD  b complex vitamins tablet Take 1 tablet by mouth daily.     Yes Historical Provider, MD  bimatoprost (LUMIGAN)  0.01 % SOLN Place 1 drop into the left eye at bedtime.   Yes Historical Provider, MD  brimonidine (ALPHAGAN) 0.15 % ophthalmic solution Place 1 drop into the left eye 2 (two) times daily.    Yes Historical Provider, MD  cholestyramine Lanetta Inch) 4 G packet Take 1 packet by mouth 2 (two) times daily.    Yes Historical Provider, MD  Choline Fenofibrate (TRILIPIX) 135 MG capsule Take 135 mg by mouth daily.    Yes Historical Provider, MD  ciprofloxacin (CIPRO) 500 MG tablet Take 500 mg by mouth 2 (two) times daily. 01/30/14 02/09/14 Yes Historical Provider, MD  clonazePAM (KLONOPIN) 1 MG tablet Take 1 mg by mouth 3 (three) times daily as needed. Anxiety   Yes Historical Provider, MD  dicyclomine (BENTYL) 10 MG capsule Take 10 mg by mouth 4 (four) times daily -  before meals and at bedtime.    Yes Historical Provider, MD  diphenhydrAMINE (SOMINEX) 25 MG tablet Take 50 mg by mouth at bedtime as needed for sleep.    Yes Historical Provider, MD  dorzolamide-timolol (COSOPT) 22.3-6.8 MG/ML ophthalmic solution Place 1 drop into the left eye 2 (two) times daily.   Yes Historical Provider, MD  dutasteride (AVODART) 0.5 MG capsule Take 0.5 mg by mouth daily.     Yes Historical Provider, MD  esomeprazole (NEXIUM) 20 MG capsule Take 20 mg by mouth 2 (two) times daily.    Yes Historical Provider, MD  fesoterodine (TOVIAZ) 8 MG TB24 tablet Take 8 mg by mouth at bedtime.   Yes Historical Provider, MD  loxapine (LOXITANE) 10 MG capsule Take 10 mg by mouth 2 (two) times daily.    Yes Historical Provider, MD  mercaptopurine (PURINETHOL) 50 MG tablet Take 75 mg by mouth daily. 1 tablet and a half tablet Give on an empty stomach 1 hour before or 2 hours after meals. Caution: Chemotherapy.   Yes Historical Provider, MD  mesalamine (PENTASA) 250 MG CR capsule Take 1,500 mg by mouth 3 (three) times daily.   Yes Historical Provider, MD  mirabegron ER (MYRBETRIQ) 50 MG TB24 tablet Take 50 mg by mouth daily.   Yes Historical  Provider, MD  ondansetron (ZOFRAN) 8 MG tablet Take 8 mg by mouth 2 (two) times daily.   Yes Historical Provider, MD  potassium chloride (KLOR-CON) 10 MEQ CR tablet Take 10 mEq by mouth 2 (two) times daily.    Yes Historical Provider, MD  rosuvastatin (CRESTOR) 20 MG tablet Take 20 mg by mouth every morning.   Yes Historical Provider, MD  tiotropium (SPIRIVA HANDIHALER) 18 MCG inhalation capsule PLACE 1 CAPSULE INTO INHALER AND INHALE DAILY 07/11/13  Yes Kalman Shan, MD  traZODone (DESYREL) 50 MG tablet Take 150 mg by mouth at bedtime.   Yes Historical Provider, MD  sucralfate (CARAFATE) 1 G tablet Take 1 tablet (1 g total) by mouth 4 (four) times daily -  with meals and  at bedtime. 02/03/14   Rolan Bucco, MD   BP 110/65  Pulse 82  Temp(Src) 99.2 F (37.3 C) (Oral)  Resp 18  Ht 5\' 11"  (1.803 m)  Wt 160 lb 4 oz (72.689 kg)  BMI 22.36 kg/m2  SpO2 100% Physical Exam  Constitutional: He is oriented to person, place, and time. He appears well-developed and well-nourished.  HENT:  Head: Normocephalic and atraumatic.  Right Ear: External ear normal.  Left Ear: External ear normal.  No pharyngeal erythema or exudates. No trismus. No cervical lymphadenopathy.  Eyes: Pupils are equal, round, and reactive to light.  Neck: Normal range of motion. Neck supple.  +tenderness along the right SCM muscle.  No bony tenderness, no swelling or masses palpated  Cardiovascular: Normal rate, regular rhythm and normal heart sounds.   Pulmonary/Chest: Effort normal and breath sounds normal. No respiratory distress. He has no wheezes. He has no rales. He exhibits no tenderness.  Abdominal: Soft. Bowel sounds are normal. There is tenderness (moderate TTP RLQ and epigastrium). There is no rebound and no guarding.  No rash  Musculoskeletal: Normal range of motion. He exhibits no edema.  Lymphadenopathy:    He has no cervical adenopathy.  Neurological: He is alert and oriented to person, place, and time.   Skin: Skin is warm and dry. No rash noted.  Psychiatric: He has a normal mood and affect.    ED Course  Procedures (including critical care time) Labs Review Labs Reviewed  CBC WITH DIFFERENTIAL - Abnormal; Notable for the following:    RBC 3.68 (*)    Hemoglobin 12.1 (*)    HCT 33.5 (*)    MCHC 36.1 (*)    All other components within normal limits  COMPREHENSIVE METABOLIC PANEL - Abnormal; Notable for the following:    AST 39 (*)    Alkaline Phosphatase 34 (*)    GFR calc non Af Amer 63 (*)    GFR calc Af Amer 73 (*)    All other components within normal limits  URINALYSIS, ROUTINE W REFLEX MICROSCOPIC - Abnormal; Notable for the following:    Color, Urine AMBER (*)    Bilirubin Urine SMALL (*)    All other components within normal limits  LIPASE, BLOOD    Imaging Review Ct Abdomen Pelvis W Contrast  02/03/2014   CLINICAL DATA:  Right-sided abdominal pain. Nausea and vomiting. Crohn's disease. Multiple previous bowel resections pre  EXAM: CT ABDOMEN AND PELVIS WITH CONTRAST  TECHNIQUE: Multidetector CT imaging of the abdomen and pelvis was performed using the standard protocol following bolus administration of intravenous contrast.  CONTRAST:  OMNIPAQUE IOHEXOL 300 MG/ML  SOLN  COMPARISON:  07/27/2004  FINDINGS: Liver: Multiple small hepatic cysts are stable. No liver masses are identified.  Gallbladder/Biliary: Surgical clips from prior cholecystectomy noted. No evidence of biliary ductal dilatation.  Pancreas: No mass, inflammatory changes, or other parenchymal abnormality identified.  Spleen:  Within normal limits in size and appearance.  Adrenal Glands:  No mass identified.  Kidneys/Urinary Tract: No masses identified. No evidence of hydronephrosis.  Lymph Nodes:  No pathologically enlarged lymph nodes identified.  Bowel: No evidence of wall thickening, mass, or obstruction. Surgical clips noted in right lower quadrant.  Pelvic/Reproductive Organs: No mass or other  significant abnormality identified. Penile prosthesis noted.  Vascular:  No evidence of abdominal aortic aneurysm.  Musculoskeletal: No suspicious bone lesions identified. Stable sclerotic bone lesion again noted the left sacrum, consistent with benign etiology.  Other:  None.  IMPRESSION: Stable hepatic cysts. No acute findings or other significant abnormality.   Electronically Signed   By: Myles RosenthalJohn  Stahl M.D.   On: 02/03/2014 19:00     EKG Interpretation None      MDM   Final diagnoses:  Generalized abdominal pain  Neck strain, initial encounter    Pt's labs are unremarkable.  No evidence of colitis or obstruction.  Pt well appearing, sitting up in bed, [facility]  Will d/c.  Added carafate to his meds.  Will f/u with his PMD.  I advised him to return if his symptoms worsen.    Rolan BuccoMelanie Sharronda Schweers, MD 02/03/14 [phone]

## 2014-02-11 ENCOUNTER — Other Ambulatory Visit: Payer: Self-pay | Admitting: Internal Medicine

## 2014-04-29 ENCOUNTER — Ambulatory Visit: Payer: Medicare Other | Admitting: Internal Medicine

## 2014-05-28 ENCOUNTER — Encounter (HOSPITAL_COMMUNITY): Payer: Self-pay | Admitting: Emergency Medicine

## 2014-05-28 ENCOUNTER — Emergency Department (HOSPITAL_COMMUNITY): Payer: Medicare Other

## 2014-05-28 ENCOUNTER — Emergency Department (HOSPITAL_COMMUNITY)
Admission: EM | Admit: 2014-05-28 | Discharge: 2014-05-28 | Disposition: A | Discharge status: Home / Self Care | Payer: Medicare Other | Attending: Emergency Medicine | Admitting: Emergency Medicine

## 2014-05-28 DIAGNOSIS — R05 Cough: Secondary | ICD-10-CM

## 2014-05-28 DIAGNOSIS — R059 Cough, unspecified: Secondary | ICD-10-CM

## 2014-05-28 DIAGNOSIS — H409 Unspecified glaucoma: Secondary | ICD-10-CM | POA: Diagnosis not present

## 2014-05-28 DIAGNOSIS — Z8659 Personal history of other mental and behavioral disorders: Secondary | ICD-10-CM | POA: Insufficient documentation

## 2014-05-28 DIAGNOSIS — Z79899 Other long term (current) drug therapy: Secondary | ICD-10-CM | POA: Insufficient documentation

## 2014-05-28 DIAGNOSIS — E785 Hyperlipidemia, unspecified: Secondary | ICD-10-CM | POA: Insufficient documentation

## 2014-05-28 DIAGNOSIS — Z87891 Personal history of nicotine dependence: Secondary | ICD-10-CM | POA: Insufficient documentation

## 2014-05-28 DIAGNOSIS — Z8701 Personal history of pneumonia (recurrent): Secondary | ICD-10-CM | POA: Insufficient documentation

## 2014-05-28 DIAGNOSIS — J441 Chronic obstructive pulmonary disease with (acute) exacerbation: Secondary | ICD-10-CM

## 2014-05-28 DIAGNOSIS — I10 Essential (primary) hypertension: Secondary | ICD-10-CM | POA: Diagnosis not present

## 2014-05-28 DIAGNOSIS — K5090 Crohn's disease, unspecified, without complications: Secondary | ICD-10-CM | POA: Diagnosis not present

## 2014-05-28 DIAGNOSIS — R079 Chest pain, unspecified: Secondary | ICD-10-CM | POA: Diagnosis present

## 2014-05-28 LAB — I-STAT TROPONIN, ED: Troponin i, poc: 0 ng/mL (ref 0.00–0.08)

## 2014-05-28 LAB — BASIC METABOLIC PANEL
Anion gap: 11 (ref 5–15)
BUN: 16 mg/dL (ref 6–23)
CO2: 25 mEq/L (ref 19–32)
Calcium: 9.2 mg/dL (ref 8.4–10.5)
Chloride: 103 mEq/L (ref 96–112)
Creatinine, Ser: 1.3 mg/dL (ref 0.50–1.35)
GFR calc Af Amer: 67 mL/min — ABNORMAL LOW (ref >90)
GFR calc non Af Amer: 58 mL/min — ABNORMAL LOW (ref >90)
Glucose, Bld: 94 mg/dL (ref 70–99)
Potassium: 4.3 mEq/L (ref 3.7–5.3)
Sodium: 139 mEq/L (ref 137–147)

## 2014-05-28 LAB — CBC
HCT: 33.2 % — ABNORMAL LOW (ref 39.0–52.0)
Hemoglobin: 11.1 g/dL — ABNORMAL LOW (ref 13.0–17.0)
MCH: 31.7 pg (ref 26.0–34.0)
MCHC: 33.4 g/dL (ref 30.0–36.0)
MCV: 94.9 fL (ref 78.0–100.0)
Platelets: 151 10*3/uL (ref 150–400)
RBC: 3.5 MIL/uL — ABNORMAL LOW (ref 4.22–5.81)
RDW: 13.6 % (ref 11.5–15.5)
WBC: 5.5 10*3/uL (ref 4.0–10.5)

## 2014-05-28 IMAGING — CR DG CHEST 2V
2 series · 2 of 2 positions shown · non-contrast
Comparison: Chest CT [E3], chest radiograph [DATE].

CLINICAL DATA: COPD.  Cough for 1 month.  Chest pain for 3 weeks.

EXAM:
CHEST  2 VIEW

[w chest pa]
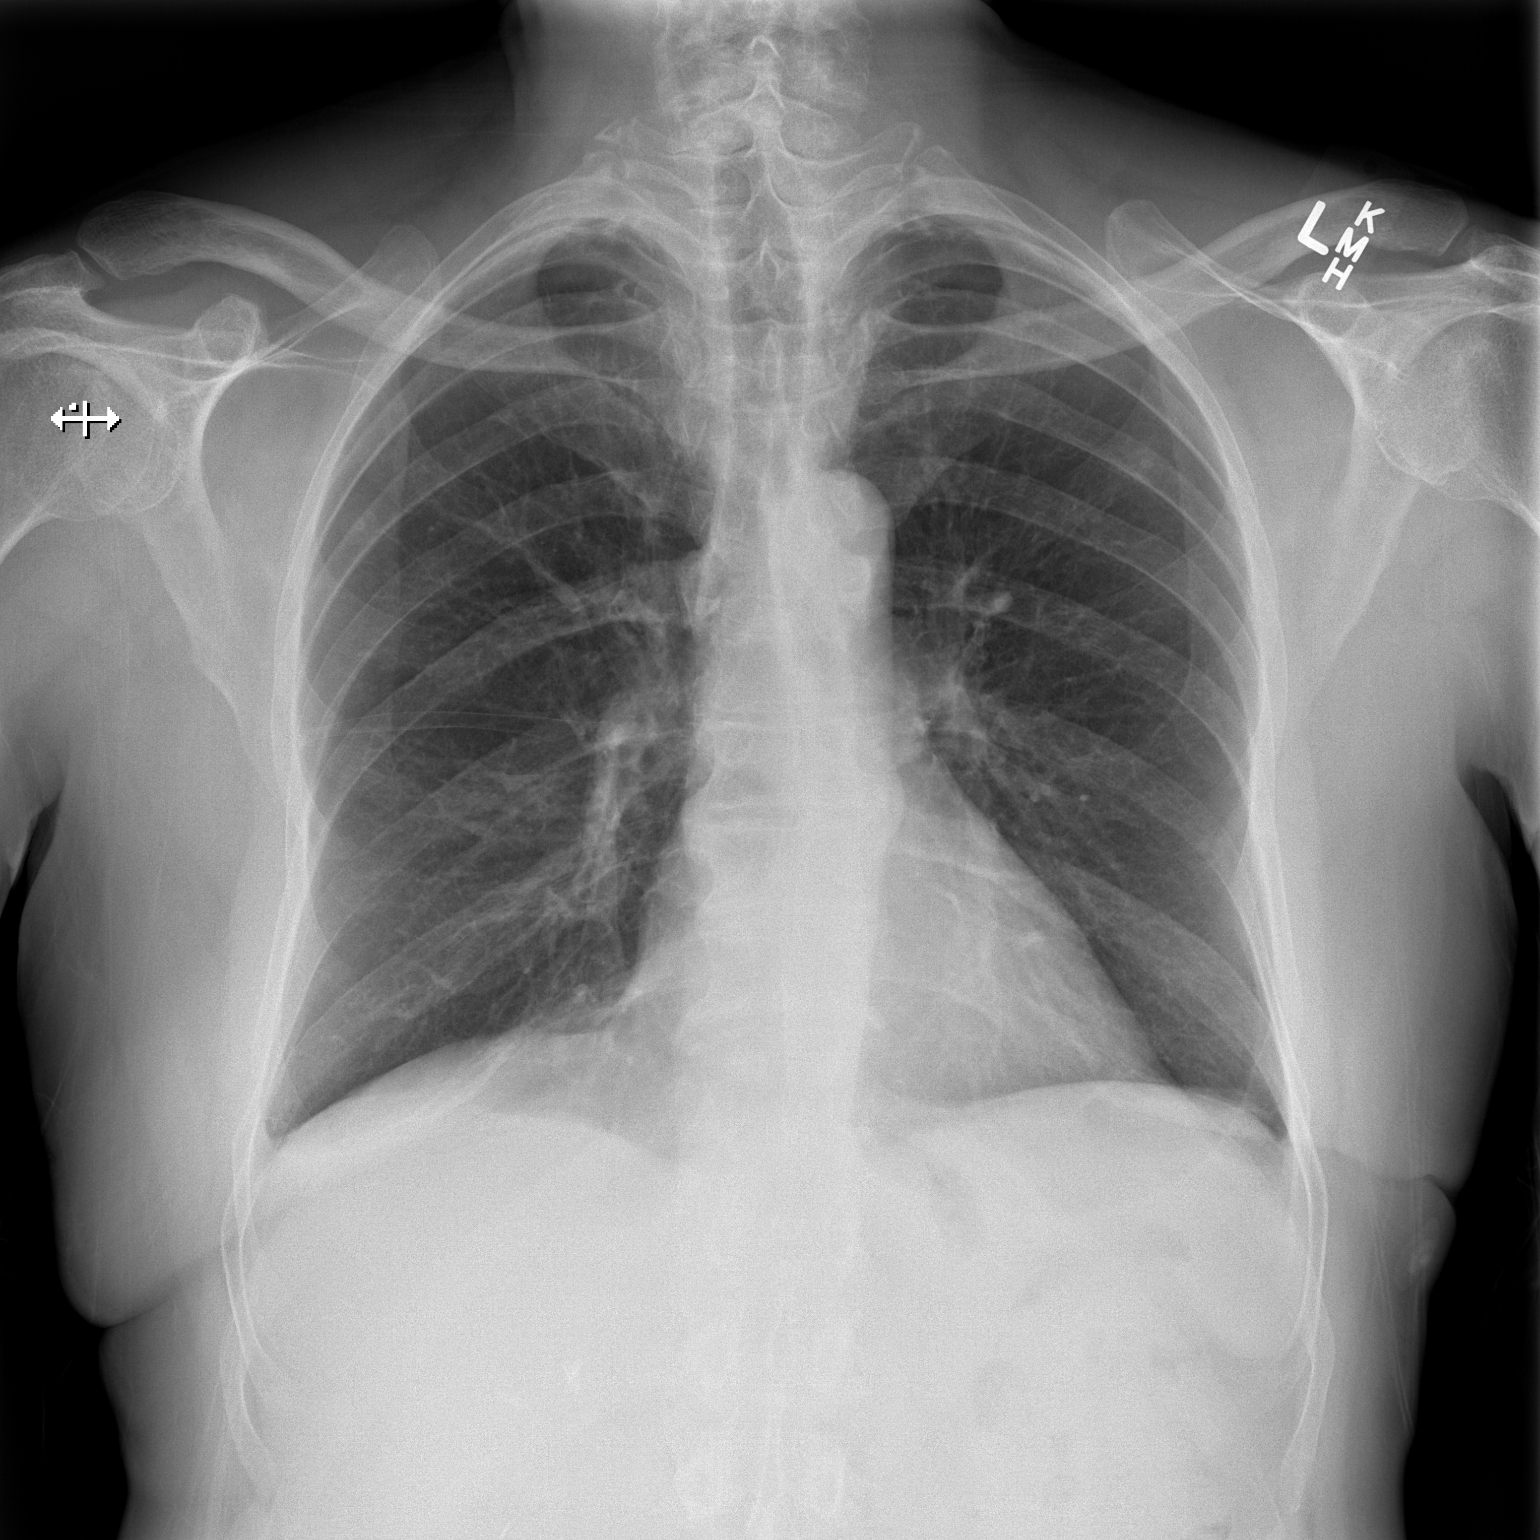

[w chest lat]
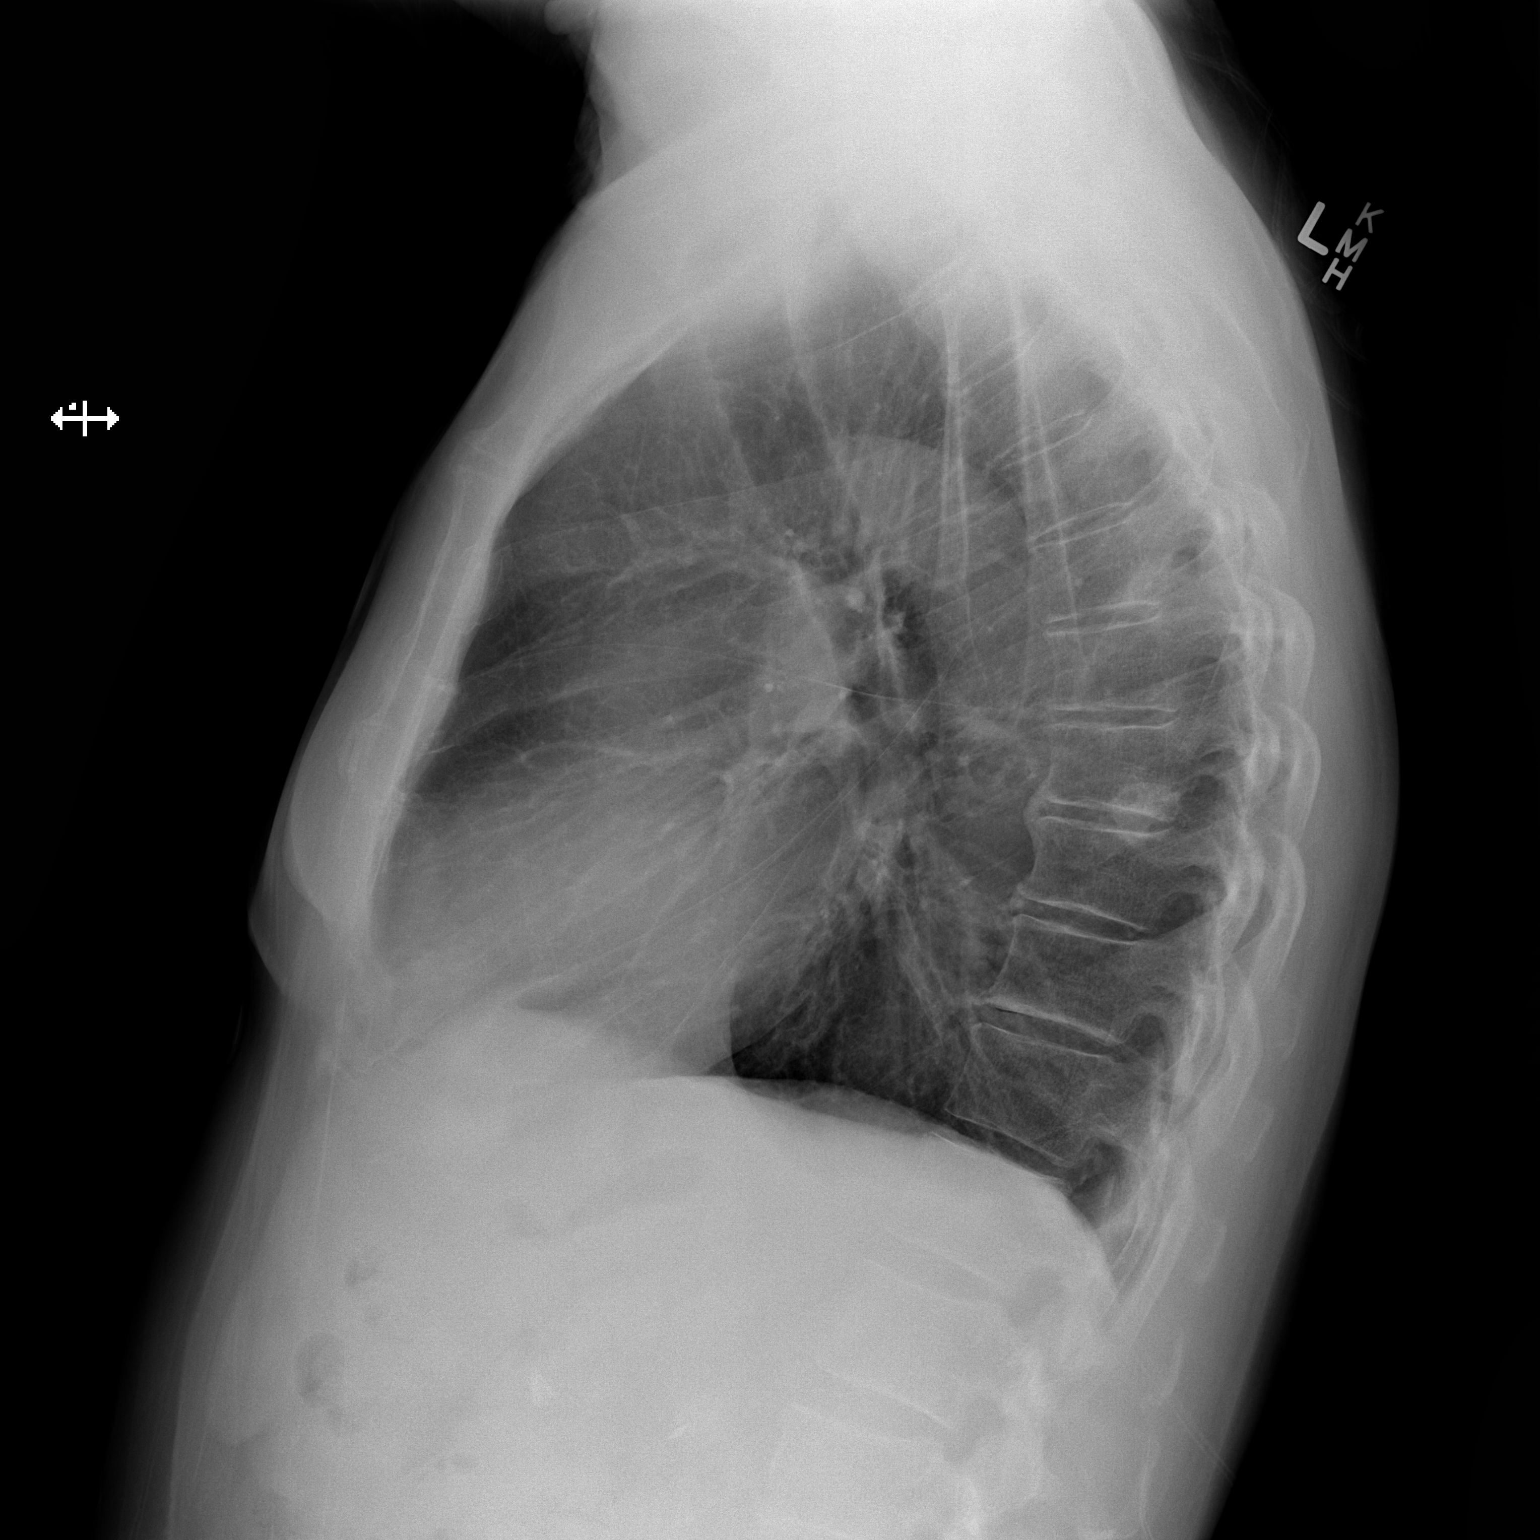

[2 of 2 positions shown; findings below may reference images not displayed]

FINDINGS: Blunting of the RIGHT costophrenic angle is present, likely
associated with emphysema or scarring. No blunting of the
costophrenic angles is present on the lateral view of the chest to
suggest effusion. Nodular density projects over thoracic spine on
the lateral view, representing an exostosis from the adjacent rib on
prior CT.

Emphysema is present with flattening of the hemidiaphragms and
enlargement of the retrosternal clear space. Partially translucent
monitoring buttons overlie the chest. The cardiopericardial
silhouette and mediastinal contours are normal.
IMPRESSION: Emphysema without active cardiopulmonary disease.

## 2014-05-28 MED ORDER — IPRATROPIUM BROMIDE 0.02 % IN SOLN
0.5000 mg | Freq: Once | RESPIRATORY_TRACT | Status: AC
  Administered 2014-05-28: 0.5 mg via RESPIRATORY_TRACT
  Filled 2014-05-28: qty 2.5

## 2014-05-28 MED ORDER — PREDNISONE 50 MG PO TABS: 50.0000 mg | ORAL_TABLET | Freq: Every day | ORAL | Status: DC

## 2014-05-28 MED ORDER — MOXIFLOXACIN HCL 400 MG PO TABS
400.0000 mg | ORAL_TABLET | Freq: Every day | ORAL | Status: DC
Start: 2014-05-28 — End: 2014-07-09

## 2014-05-28 MED ORDER — PREDNISONE 20 MG PO TABS
60.0000 mg | ORAL_TABLET | Freq: Once | ORAL | Status: AC
  Administered 2014-05-28: 60 mg via ORAL
  Filled 2014-05-28: qty 3

## 2014-05-28 MED ORDER — ALBUTEROL SULFATE (2.5 MG/3ML) 0.083% IN NEBU
5.0000 mg | INHALATION_SOLUTION | Freq: Once | RESPIRATORY_TRACT | Status: AC
  Administered 2014-05-28: 5 mg via RESPIRATORY_TRACT
  Filled 2014-05-28: qty 6

## 2014-05-28 NOTE — ED Notes (Signed)
Pt with Hx of COPD c/o cough x 1 month and chest pain x 3 weeks, patient saw PCP for these symptoms, PCP gave Dx of COPD exacerbation and allergies, prescribed prednisone and Abx. Pt in no apparent distress.

## 2014-05-28 NOTE — Discharge Instructions (Signed)
Return to the ED with any concerns including difficulty breathing despite using albuterol every 4 hours, not drinking fluids, decreased urine output, vomiting and not able to keep down liquids or medications, decreased level of alertness/lethargy, or any other alarming symptoms °

## 2014-05-28 NOTE — ED Provider Notes (Signed)
CSN: [id number]     Arrival date & time 05/28/14  1828 History   First MD Initiated Contact with Patient 05/28/14 2111     Chief Complaint  Patient presents with  . Chest Pain  . Cough     (Consider location/radiation/quality/duration/timing/severity/associated sxs/prior Treatment) HPI  Pt with hx of COPD presents with c/o cough which has been present over the past month.  Pt states that his cough is productive of yellow mucous.  No fever/chills.  No difficulty breathing.  He states he has some chest pain but only with coughing.  He was seen by his PMD one month ago and was placed on course of prednisone and doxycycline.  He states this did not help his symptoms.  He states he does smoke tobacco- 1 pack per day.  No leg swelling.  There are no other associated systemic symptoms, there are no other alleviating or modifying factors.   Past Medical History  Diagnosis Date  . Dysphagia   . Crohn's disease   . IBS (irritable bowel syndrome)   . Hypertension   . Hyperlipidemia   . Allergic rhinitis   . Paranoid schizophrenia   . Glaucoma   . Peripheral vascular disease   . Dyspnea   . History of pneumonia   . Atelectasis   . COPD (chronic obstructive pulmonary disease)    Past Surgical History  Procedure Laterality Date  . Cholecystectomy    . Colon surgery  1996   Family History  Problem Relation Age of Onset  . Hypertension Mother   . Heart disease Mother   . Hypertension Father   . Heart disease Father   . Lung cancer Brother     2006   History  Substance Use Topics  . Smoking status: Former Smoker -- 1.00 packs/day for 27 years    Types: Cigarettes    Quit date: 08/12/2013  . Smokeless tobacco: Never Used  . Alcohol Use: No    Review of Systems  ROS reviewed and all otherwise negative except for mentioned in HPI    Allergies  Codeine; Penicillins; Sulfamethoxazole-trimethoprim; and Sulfonamide derivatives  Home Medications   Prior to Admission medications    Medication Sig Start Date End Date Taking? Authorizing Provider  albuterol (PROVENTIL HFA;VENTOLIN HFA) 108 (90 BASE) MCG/ACT inhaler Inhale 2 puffs into the lungs every 6 (six) hours as needed for wheezing or shortness of breath.   Yes Historical Provider, MD  alfuzosin (UROXATRAL) 10 MG 24 hr tablet Take 10 mg by mouth at bedtime.    Yes Historical Provider, MD  b complex vitamins tablet Take 1 tablet by mouth daily.     Yes Historical Provider, MD  bimatoprost (LUMIGAN) 0.01 % SOLN Place 1 drop into the left eye at bedtime.   Yes Historical Provider, MD  brimonidine (ALPHAGAN) 0.15 % ophthalmic solution Place 1 drop into the left eye 2 (two) times daily.    Yes Historical Provider, MD  clonazePAM (KLONOPIN) 1 MG tablet Take 1 mg by mouth 3 (three) times daily as needed. Anxiety   Yes Historical Provider, MD  dicyclomine (BENTYL) 10 MG capsule Take 10 mg by mouth 4 (four) times daily -  before meals and at bedtime.    Yes Historical Provider, MD  diphenhydrAMINE (SOMINEX) 25 MG tablet Take 50 mg by mouth at bedtime as needed for sleep.    Yes Historical Provider, MD  dorzolamide-timolol (COSOPT) 22.3-6.8 MG/ML ophthalmic solution Place 1 drop into the left eye 2 (two) times daily.  Yes Historical Provider, MD  dutasteride (AVODART) 0.5 MG capsule Take 0.5 mg by mouth daily.     Yes Historical Provider, MD  esomeprazole (NEXIUM) 20 MG capsule Take 20 mg by mouth 2 (two) times daily.    Yes Historical Provider, MD  fesoterodine (TOVIAZ) 8 MG TB24 tablet Take 8 mg by mouth at bedtime.   Yes Historical Provider, MD  loxapine (LOXITANE) 10 MG capsule Take 10 mg by mouth 2 (two) times daily.    Yes Historical Provider, MD  mercaptopurine (PURINETHOL) 50 MG tablet Take 75 mg by mouth daily. 1 tablet and a half tablet Give on an empty stomach 1 hour before or 2 hours after meals. Caution: Chemotherapy.   Yes Historical Provider, MD  mesalamine (PENTASA) 250 MG CR capsule Take 1,000 mg by mouth 3  (three) times daily.    Yes Historical Provider, MD  mirabegron ER (MYRBETRIQ) 50 MG TB24 tablet Take 50 mg by mouth daily.   Yes Historical Provider, MD  ondansetron (ZOFRAN) 8 MG tablet Take 8 mg by mouth 2 (two) times daily.   Yes Historical Provider, MD  potassium chloride (KLOR-CON) 10 MEQ CR tablet Take 10 mEq by mouth 2 (two) times daily.    Yes Historical Provider, MD  rosuvastatin (CRESTOR) 20 MG tablet Take 20 mg by mouth every morning.   Yes Historical Provider, MD  SPIRIVA HANDIHALER 18 MCG inhalation capsule INHALE THE CONTENT OF 1 CAPSULE VIA HANDIHALER DAILY 02/12/14  Yes Kalman Shan, MD  traZODone (DESYREL) 50 MG tablet Take 150 mg by mouth at bedtime.   Yes Historical Provider, MD  albuterol (PROAIR HFA) 108 (90 BASE) MCG/ACT inhaler INHALE 2 PUFFS INTO THE LUNGS EVERY 6 HOURS AS NEEDED Patient taking differently: INHALE 2 PUFFS INTO THE LUNGS EVERY 6 HOURS AS NEEDED 07/02/13   Waymon Budge, MD  cholestyramine Lanetta Inch) 4 G packet Take 1 packet by mouth 2 (two) times daily.     Historical Provider, MD  Choline Fenofibrate (TRILIPIX) 135 MG capsule Take 135 mg by mouth daily.     Historical Provider, MD  moxifloxacin (AVELOX) 400 MG tablet Take 1 tablet (400 mg total) by mouth daily at 8 pm. 05/28/14   Ethelda Chick, MD  predniSONE (DELTASONE) 50 MG tablet Take 1 tablet (50 mg total) by mouth daily. 05/28/14   Ethelda Chick, MD  sucralfate (CARAFATE) 1 G tablet Take 1 tablet (1 g total) by mouth 4 (four) times daily -  with meals and at bedtime. Patient not taking: Reported on 05/28/2014 02/03/14   Rolan Bucco, MD   BP 126/87 mmHg  Pulse 86  Temp(Src) 98.1 F (36.7 C) (Oral)  Resp 18  SpO2 98%  Vitals reviewed Physical Exam  Physical Examination: General appearance - alert, well appearing, and in no distress Mental status - alert, oriented to person, place, and time Eyes - no conjunctival injection, no scleral icterus Mouth - mucous membranes moist, pharynx  normal without lesions Chest - clear to auscultation, no wheezes, rales or rhonchi, symmetric air entry, mild expiratory wheezing, normal respiratory effort Heart - normal rate, regular rhythm, normal S1, S2, no murmurs, rubs, clicks or gallops Abdomen - soft, nontender, nondistended, no masses or organomegaly Extremities - peripheral pulses normal, no pedal edema, no clubbing or cyanosis Skin - normal coloration and turgor, no rashes  ED Course  Procedures (including critical care time) Labs Review Labs Reviewed  CBC - Abnormal; Notable for the following:    RBC 3.50 (*)  Hemoglobin 11.1 (*)    HCT 33.2 (*)    All other components within normal limits  BASIC METABOLIC PANEL - Abnormal; Notable for the following:    GFR calc non Af Amer 58 (*)    GFR calc Af Amer 67 (*)    All other components within normal limits  Rosezena SensorI-STAT TROPOININ, ED    Imaging Review Dg Chest 2 View  05/28/2014   CLINICAL DATA:  COPD.  Cough for 1 month.  Chest pain for 3 weeks.  EXAM: CHEST  2 VIEW  COMPARISON:  Chest CT 2014, chest radiograph 11/30/2011.  FINDINGS: Blunting of the RIGHT costophrenic angle is present, likely associated with emphysema or scarring. No blunting of the costophrenic angles is present on the lateral view of the chest to suggest effusion. Nodular density projects over thoracic spine on the lateral view, representing an exostosis from the adjacent rib on prior CT.  Emphysema is present with flattening of the hemidiaphragms and enlargement of the retrosternal clear space. Partially translucent monitoring buttons overlie the chest. The cardiopericardial silhouette and mediastinal contours are normal.  IMPRESSION: Emphysema without active cardiopulmonary disease.   Electronically Signed   By: Andreas NewportGeoffrey  Lamke M.D.   On: 05/28/2014 21:24     EKG Interpretation   Date/Time:  Thursday May 28 2014 18:53:19 EST Ventricular Rate:  85 PR Interval:  142 QRS Duration: 82 QT Interval:   352 QTC Calculation: 418 R Axis:   29 Text Interpretation:  Sinus rhythm No significant change since last  tracing Confirmed by [facility]  MD, Amando Chaput [phone]([id number]) on 05/28/2014 10:05:40 PM      MDM   Final diagnoses:  COPD exacerbation    Pt presents with c/o cough productive of yellow mucous over the past month.  He has mild exp wheezing on exam.  cxr is reassuring.   Xray images reviewed and interpreted by me as well.  Pt treated with albuterol/atrovent neb in the ED.  Will start on prednisone and moxifloxacin for COPD exacerbation.  EKG and troponin reassuring- doubt ACS/PE as symptoms are so c/w bronchitis.  Discharged with strict return precautions.  Pt agreeable with plan.    Ethelda ChickMartha K Linker, MD 05/28/14 [phone]

## 2014-06-01 ENCOUNTER — Encounter: Payer: Self-pay | Admitting: Pulmonary Disease

## 2014-06-01 ENCOUNTER — Ambulatory Visit (INDEPENDENT_AMBULATORY_CARE_PROVIDER_SITE_OTHER): Payer: Medicare Other | Admitting: Pulmonary Disease

## 2014-06-01 VITALS — BP 116/62 | HR 101 | Temp 98.0°F | Ht 71.0 in | Wt 177.6 lb

## 2014-06-01 DIAGNOSIS — J438 Other emphysema: Secondary | ICD-10-CM

## 2014-06-01 DIAGNOSIS — J441 Chronic obstructive pulmonary disease with (acute) exacerbation: Secondary | ICD-10-CM | POA: Insufficient documentation

## 2014-06-01 MED ORDER — PREDNISONE 10 MG PO TABS: ORAL_TABLET | ORAL | Status: DC

## 2014-06-01 NOTE — Assessment & Plan Note (Signed)
The patient presents with a recent episode of acute bronchitis complicated by an acute COPD exacerbation. He has been treated with a course of antibiotics which he is still taking, as well as a very short course of prednisone which is finished. He is still having chest congestion and shortness of breath, however he is still smoking. I think he needs a longer course of prednisone, but also stressed to him the importance of total smoking cessation. I asked him to finish up his antibiotics.

## 2014-06-01 NOTE — Patient Instructions (Signed)
Will treat with 8 days of prednisone to get you thru this episode, but you MUST stop smoking or this will happen again over and over. Finish up your antibiotic No change in breathing medications. followup with Dr. Marchelle Gearing in 2 weeks.

## 2014-06-01 NOTE — Progress Notes (Signed)
   Subjective:    Patient ID: Chad Moss, male    DOB: 1956/03/10, 58 y.o.   MRN: [id number]  HPI Patient comes in today for an acute sick visit. He is normally followed by Dr. Marchelle Gearingamaswamy for COPD, and unfortunately continues to smoke. He was recently in the emergency room for increased chest congestion, cough with purulent mucus, and increased shortness of breath. His chest x-ray showed no acute process, his EKG was normal, and his troponin was normal. The patient was treated with a course of antibiotics which she is still taking, and only 3-4 days of prednisone which he has finished. The patient states that he is still having some chest congestion and shortness of breath, and feels that he has not returned to his baseline. Again, he is still smoking.   Review of Systems  Constitutional: Negative for fever and unexpected weight change.  HENT: Positive for congestion. Negative for dental problem, ear pain, nosebleeds, postnasal drip, rhinorrhea, sinus pressure, sneezing, sore throat and trouble swallowing.   Eyes: Negative for redness and itching.  Respiratory: Positive for cough, chest tightness and shortness of breath. Negative for wheezing.   Cardiovascular: Negative for palpitations and leg swelling.  Gastrointestinal: Negative for nausea and vomiting.  Genitourinary: Negative for dysuria.  Musculoskeletal: Negative for joint swelling.  Skin: Negative for rash.  Neurological: Negative for headaches.  Hematological: Does not bruise/bleed easily.  Psychiatric/Behavioral: Negative for dysphoric mood. The patient is not nervous/anxious.        Objective:   Physical Exam Thin male in no acute distress Nose without purulence or discharge noted Neck without lymphadenopathy or thyromegaly Chest with a few rhonchi, no wheezes and adequate airflow Cardiac exam with regular rate and rhythm Lower extremities without edema, no cyanosis Alert and oriented, moves all 4 extremities.         Assessment & Plan:

## 2014-07-09 ENCOUNTER — Ambulatory Visit (INDEPENDENT_AMBULATORY_CARE_PROVIDER_SITE_OTHER): Payer: 59 | Admitting: Internal Medicine

## 2014-07-09 ENCOUNTER — Encounter: Payer: Self-pay | Admitting: Internal Medicine

## 2014-07-09 VITALS — BP 110/76 | HR 96 | Ht 71.5 in | Wt 180.0 lb

## 2014-07-09 DIAGNOSIS — R109 Unspecified abdominal pain: Secondary | ICD-10-CM

## 2014-07-09 DIAGNOSIS — Z720 Tobacco use: Secondary | ICD-10-CM

## 2014-07-09 DIAGNOSIS — J449 Chronic obstructive pulmonary disease, unspecified: Secondary | ICD-10-CM | POA: Insufficient documentation

## 2014-07-09 HISTORY — DX: Unspecified abdominal pain: R10.9

## 2014-07-09 LAB — PULMONARY FUNCTION TEST
DL/VA % pred: 90 %
DL/VA: 4.24 ml/min/mmHg/L
DLCO unc % pred: 56 %
DLCO unc: 19.41 ml/min/mmHg
FEF 25-75 Post: 2.25 L/sec
FEF 25-75 Pre: 2.04 L/sec
FEF2575-%Change-Post: 10 %
FEF2575-%Pred-Post: 74 %
FEF2575-%Pred-Pre: 67 %
FEV1-%Change-Post: 1 %
FEV1-%Pred-Post: 84 %
FEV1-%Pred-Pre: 83 %
FEV1-Post: 2.78 L
FEV1-Pre: 2.74 L
FEV1FVC-%Change-Post: 5 %
FEV1FVC-%Pred-Pre: 94 %
FEV6-%Change-Post: -3 %
FEV6-%Pred-Post: 86 %
FEV6-%Pred-Pre: 90 %
FEV6-Post: 3.55 L
FEV6-Pre: 3.69 L
FEV6FVC-%Change-Post: 0 %
FEV6FVC-%Pred-Post: 103 %
FEV6FVC-%Pred-Pre: 103 %
FVC-%Change-Post: -3 %
FVC-%Pred-Post: 84 %
FVC-%Pred-Pre: 87 %
FVC-Post: 3.57 L
FVC-Pre: 3.71 L
Post FEV1/FVC ratio: 78 %
Post FEV6/FVC ratio: 99 %
Pre FEV1/FVC ratio: 74 %
Pre FEV6/FVC Ratio: 99 %

## 2014-07-09 NOTE — Patient Instructions (Addendum)
ICD-9-CM ICD-10-CM   1. Chronic obstructive pulmonary disease, unspecified COPD, unspecified chronic bronchitis type 496 J44.9   2. Tobacco abuse 305.1 Z72.0   3. Abdominal pain, unspecified abdominal location 789.00 R10.9     - COPD disease is stable: continue your inhalers - Smoking: please quit smoking  - Abdominal pain: do not know cause. Please talk to your Maximino Greenland, MD pcp  Followup  6-8 months for copd followup with NP Tammy Parrett  return sooner if needed

## 2014-07-09 NOTE — Progress Notes (Signed)
PFT done today. 

## 2014-07-09 NOTE — Progress Notes (Signed)
Subjective:    Patient ID: Chad Moss, male    DOB: 1955-06-22, 59 y.o.   MRN: [id number]  HPI   OV 07/09/2014  Chief Complaint  Patient presents with  . Follow-up    Pt here after PFT. Pt stated his breathing is worse since last OV. Pt c/o increase in SOB, non prod cough. Pt denies CP/tightness.     Smoking:  reports that he has been smoking Cigarettes.  He has a 27 pack-year smoking history. He has never used smokeless tobacco.  Lung Cancer Screen: CT chest LDCT June 2014: no nodules.     COPD: COPD is stable. In December 2015 he had a COPD exacerbation and was seen in our office acutely. Currently he is resolved from this. Spirometry today actually shows normal spirometry but diffusion capacity is low at 56% and therefore he is isolated low diffusion capacity account of his emphysema.  New issue: He is reporting nonspecific abdominal pain for the last 1 week. He thinks is due to smoking. In the past ciprofloxacin resolve this. This will specific aggravating or relieving factors. It is mild and constant. There is no associated diarrhea or constipation fever or chills. No vomiting    Immunization History  Administered Date(s) Administered  . Influenza Split 04/06/2011, 03/12/2012  . Influenza Whole 05/10/2009  . Influenza,inj,Quad PF,36+ Mos 03/04/2013  . Influenza-Unspecified 04/19/2014  . Pneumococcal Conjugate-13 01/27/2009  . Pneumococcal Polysaccharide-23 06/19/2008     Review of Systems  Constitutional: Negative for fever and unexpected weight change.  HENT: Negative for congestion, dental problem, ear pain, nosebleeds, postnasal drip, rhinorrhea, sinus pressure, sneezing, sore throat and trouble swallowing.   Eyes: Negative for redness and itching.  Respiratory: Positive for cough and shortness of breath. Negative for chest tightness and wheezing.   Cardiovascular: Negative for palpitations and leg swelling.  Gastrointestinal: Negative for nausea and  vomiting.  Genitourinary: Negative for dysuria.  Musculoskeletal: Negative for joint swelling.  Skin: Negative for rash.  Neurological: Negative for headaches.  Hematological: Does not bruise/bleed easily.  Psychiatric/Behavioral: Negative for dysphoric mood. The patient is not nervous/anxious.      Current outpatient prescriptions:  .  albuterol (PROVENTIL HFA;VENTOLIN HFA) 108 (90 BASE) MCG/ACT inhaler, Inhale 2 puffs into the lungs every 6 (six) hours as needed for wheezing or shortness of breath., Disp: , Rfl:  .  alfuzosin (UROXATRAL) 10 MG 24 hr tablet, Take 10 mg by mouth at bedtime. , Disp: , Rfl:  .  bimatoprost (LUMIGAN) 0.01 % SOLN, Place 1 drop into the left eye at bedtime., Disp: , Rfl:  .  brimonidine (ALPHAGAN) 0.15 % ophthalmic solution, Place 1 drop into the left eye 2 (two) times daily. , Disp: , Rfl:  .  cholestyramine (QUESTRAN) 4 G packet, Take 1 packet by mouth 2 (two) times daily. , Disp: , Rfl:  .  Choline Fenofibrate (TRILIPIX) 135 MG capsule, Take 135 mg by mouth daily. , Disp: , Rfl:  .  clonazePAM (KLONOPIN) 1 MG tablet, Take 1 mg by mouth 3 (three) times daily as needed. Anxiety, Disp: , Rfl:  .  dicyclomine (BENTYL) 10 MG capsule, Take 10 mg by mouth 4 (four) times daily -  before meals and at bedtime. , Disp: , Rfl:  .  diphenhydrAMINE (SOMINEX) 25 MG tablet, Take 50 mg by mouth at bedtime as needed for sleep. , Disp: , Rfl:  .  dorzolamide-timolol (COSOPT) 22.3-6.8 MG/ML ophthalmic solution, Place 1 drop into the left eye 2 (two)  times daily., Disp: , Rfl:  .  dutasteride (AVODART) 0.5 MG capsule, Take 0.5 mg by mouth daily.  , Disp: , Rfl:  .  esomeprazole (NEXIUM) 20 MG capsule, Take 20 mg by mouth 2 (two) times daily. , Disp: , Rfl:  .  fesoterodine (TOVIAZ) 8 MG TB24 tablet, Take 8 mg by mouth at bedtime., Disp: , Rfl:  .  loxapine (LOXITANE) 10 MG capsule, Take 10 mg by mouth 2 (two) times daily. , Disp: , Rfl:  .  mercaptopurine (PURINETHOL) 50 MG  tablet, Take 75 mg by mouth daily. 1 tablet and a half tablet Give on an empty stomach 1 hour before or 2 hours after meals. Caution: Chemotherapy., Disp: , Rfl:  .  mesalamine (PENTASA) 250 MG CR capsule, Take 1,000 mg by mouth 3 (three) times daily. , Disp: , Rfl:  .  mirabegron ER (MYRBETRIQ) 50 MG TB24 tablet, Take 50 mg by mouth daily., Disp: , Rfl:  .  ondansetron (ZOFRAN) 8 MG tablet, Take 8 mg by mouth 2 (two) times daily., Disp: , Rfl:  .  potassium chloride (KLOR-CON) 10 MEQ CR tablet, Take 10 mEq by mouth 2 (two) times daily. , Disp: , Rfl:  .  rosuvastatin (CRESTOR) 20 MG tablet, Take 20 mg by mouth every morning., Disp: , Rfl:  .  SPIRIVA HANDIHALER 18 MCG inhalation capsule, INHALE THE CONTENT OF 1 CAPSULE VIA HANDIHALER DAILY, Disp: 30 capsule, Rfl: 5 .  sucralfate (CARAFATE) 1 G tablet, Take 1 tablet (1 g total) by mouth 4 (four) times daily -  with meals and at bedtime., Disp: 15 tablet, Rfl: 0 .  traZODone (DESYREL) 50 MG tablet, Take 150 mg by mouth at bedtime., Disp: , Rfl:  .  b complex vitamins tablet, Take 1 tablet by mouth daily.  , Disp: , Rfl:       Objective:   Physical Exam  Constitutional: He is oriented to person, place, and time. He appears well-developed and well-nourished. No distress.  HENT:  Head: Normocephalic and atraumatic.  Right Ear: External ear normal.  Left Ear: External ear normal.  Mouth/Throat: Oropharynx is clear and moist. No oropharyngeal exudate.  Eyes: Conjunctivae and EOM are normal. Pupils are equal, round, and reactive to light. Right eye exhibits no discharge. Left eye exhibits no discharge. No scleral icterus.  Neck: Normal range of motion. Neck supple. No JVD present. No tracheal deviation present. No thyromegaly present.  Cardiovascular: Normal rate, regular rhythm and intact distal pulses.  Exam reveals no gallop and no friction rub.   No murmur heard. Pulmonary/Chest: Effort normal and breath sounds normal. No respiratory distress.  He has no wheezes. He has no rales. He exhibits no tenderness.  Abdominal: Soft. Bowel sounds are normal. He exhibits no distension and no mass. There is tenderness. There is no rebound and no guarding.  Non specific tenderness +   Musculoskeletal: Normal range of motion. He exhibits no edema or tenderness.  Lymphadenopathy:    He has no cervical adenopathy.  Neurological: He is alert and oriented to person, place, and time. He has normal reflexes. No cranial nerve deficit. Coordination normal.  Skin: Skin is warm and dry. No rash noted. He is not diaphoretic. No erythema. No pallor.  Psychiatric: He has a normal mood and affect. His behavior is normal. Judgment and thought content normal.  Nursing note and vitals reviewed.   Filed Vitals:   07/09/14 1054  BP: 110/76  Pulse: 96  Height: 5' 11.5" (1.816  m)  Weight: 180 lb (81.647 kg)  SpO2: 98%          Assessment & Plan:     ICD-9-CM ICD-10-CM   1. Chronic obstructive pulmonary disease, unspecified COPD, unspecified chronic bronchitis type 496 J44.9   2. Tobacco abuse 305.1 Z72.0   3. Abdominal pain, unspecified abdominal location 789.00 R10.9      - COPD disease is stable: continue your inhalers - Smoking: please quit smoking  - Abdominal pain: do not know cause. Please talk to your Maximino Greenland, MD pcp  Followup  6-8 months for copd followup with NP Tammy Parrett  return sooner if needed    Dr. Brand Males, M.D., [facility].C.P Pulmonary and Critical Care Medicine Staff Physician [facility] Pager: [phone], If no answer or between  15:00h - 7:00h: call [phone]  07/09/2014 11:19 AM

## 2014-08-05 ENCOUNTER — Telehealth: Payer: Self-pay | Admitting: Internal Medicine

## 2014-08-05 MED ORDER — TIOTROPIUM BROMIDE MONOHYDRATE 18 MCG IN CAPS
ORAL_CAPSULE | RESPIRATORY_TRACT | Status: DC
Start: 2014-08-05 — End: 2015-08-11

## 2014-08-05 NOTE — Telephone Encounter (Signed)
Rx has been called into [facility]. Nothing further was needed.

## 2014-08-24 ENCOUNTER — Other Ambulatory Visit: Payer: Self-pay | Admitting: Internal Medicine

## 2014-10-12 ENCOUNTER — Telehealth: Payer: Self-pay | Admitting: Internal Medicine

## 2014-10-12 MED ORDER — AZITHROMYCIN 250 MG PO TABS: 250.0000 mg | ORAL_TABLET | ORAL | Status: DC

## 2014-10-12 NOTE — Telephone Encounter (Signed)
LMTC x 1  

## 2014-10-12 NOTE — Telephone Encounter (Signed)
Spoke with pt's caregiver.  Pt c/o runny nose (taking Claritin), prod cough (yellow), sob about the same.  Denies wheezing or chest tightness.  Taking Mucinex.  Please advise.  Allergies  Allergen Reactions  . Codeine Anaphylaxis  . Penicillins     REACTION: hives  . Sulfamethoxazole-Trimethoprim     REACTION: hives  . Sulfonamide Derivatives     REACTION: hives

## 2014-10-12 NOTE — Telephone Encounter (Signed)
Spoke with [name] - aware that ZPAK has been sent to [facility] Call if symptoms worsen or do not improve.  Nothing further needed.

## 2014-10-12 NOTE — Telephone Encounter (Signed)
  Likely acute bronchitis.  Rx ZPAK If worse, he needs to call and we will call in pred course or he should go to er   Allergies  Allergen Reactions  . Codeine Anaphylaxis  . Penicillins     REACTION: hives  . Sulfamethoxazole-Trimethoprim     REACTION: hives  . Sulfonamide Derivatives     REACTION: hives

## 2014-10-21 ENCOUNTER — Telehealth: Payer: Self-pay | Admitting: Internal Medicine

## 2014-10-21 NOTE — Telephone Encounter (Signed)
Per 10/12/14 phone note: Kalman Shan, MD at 10/12/2014 12:05 PM     Status: Signed       Expand All Collapse All    Likely acute bronchitis.  Rx ZPAK If worse, he needs to call and we will call in pred course or he should go to er      --  lmomtcb x1

## 2014-10-22 NOTE — Telephone Encounter (Signed)
lmomtcb x1 

## 2014-10-22 NOTE — Telephone Encounter (Signed)
Pt returned call [phone]

## 2014-10-22 NOTE — Telephone Encounter (Addendum)
Patient does not have any fever.  Finished Zpak, coughing up white mucus.  Runny nose.  Not taking any medication for it right now.  [facility] - [address]   Current Outpatient Prescriptions on File Prior to Visit  Medication Sig Dispense Refill  . albuterol (PROVENTIL HFA;VENTOLIN HFA) 108 (90 BASE) MCG/ACT inhaler Inhale 2 puffs into the lungs every 6 (six) hours as needed for wheezing or shortness of breath.    . alfuzosin (UROXATRAL) 10 MG 24 hr tablet Take 10 mg by mouth at bedtime.     Marland Kitchen azithromycin (ZITHROMAX) 250 MG tablet Take 1 tablet (250 mg total) by mouth as directed. 6 tablet 0  . b complex vitamins tablet Take 1 tablet by mouth daily.      . bimatoprost (LUMIGAN) 0.01 % SOLN Place 1 drop into the left eye at bedtime.    . brimonidine (ALPHAGAN) 0.15 % ophthalmic solution Place 1 drop into the left eye 2 (two) times daily.     . cholestyramine (QUESTRAN) 4 G packet Take 1 packet by mouth 2 (two) times daily.     . Choline Fenofibrate (TRILIPIX) 135 MG capsule Take 135 mg by mouth daily.     . clonazePAM (KLONOPIN) 1 MG tablet Take 1 mg by mouth 3 (three) times daily as needed. Anxiety    . dicyclomine (BENTYL) 10 MG capsule Take 10 mg by mouth 4 (four) times daily -  before meals and at bedtime.     . diphenhydrAMINE (SOMINEX) 25 MG tablet Take 50 mg by mouth at bedtime as needed for sleep.     . dorzolamide-timolol (COSOPT) 22.3-6.8 MG/ML ophthalmic solution Place 1 drop into the left eye 2 (two) times daily.    Marland Kitchen dutasteride (AVODART) 0.5 MG capsule Take 0.5 mg by mouth daily.      Marland Kitchen esomeprazole (NEXIUM) 20 MG capsule Take 20 mg by mouth 2 (two) times daily.     . fesoterodine (TOVIAZ) 8 MG TB24 tablet Take 8 mg by mouth at bedtime.    Marland Kitchen loxapine (LOXITANE) 10 MG capsule Take 10 mg by mouth 2 (two) times daily.     . mercaptopurine (PURINETHOL) 50 MG tablet Take 75 mg by mouth daily. 1 tablet and a half tablet Give on an empty stomach 1 hour before or 2 hours  after meals. Caution: Chemotherapy.    . mesalamine (PENTASA) 250 MG CR capsule Take 1,000 mg by mouth 3 (three) times daily.     . mirabegron ER (MYRBETRIQ) 50 MG TB24 tablet Take 50 mg by mouth daily.    . ondansetron (ZOFRAN) 8 MG tablet Take 8 mg by mouth 2 (two) times daily.    . potassium chloride (KLOR-CON) 10 MEQ CR tablet Take 10 mEq by mouth 2 (two) times daily.     Marland Kitchen PROAIR HFA 108 (90 BASE) MCG/ACT inhaler INHALE 2 PUFFS BY MOUTH INTO THE LUNGS EVERY 6 HOURS AS NEEDED 8.5 g 0  . rosuvastatin (CRESTOR) 20 MG tablet Take 20 mg by mouth every morning.    . sucralfate (CARAFATE) 1 G tablet Take 1 tablet (1 g total) by mouth 4 (four) times daily -  with meals and at bedtime. 15 tablet 0  . tiotropium (SPIRIVA HANDIHALER) 18 MCG inhalation capsule INHALE THE CONTENT OF 1 CAPSULE VIA HANDIHALER DAILY 30 capsule 5  . traZODone (DESYREL) 50 MG tablet Take 150 mg by mouth at bedtime.     No current facility-administered medications on file prior to  visit.   Allergies  Allergen Reactions  . Codeine Anaphylaxis  . Penicillins     REACTION: hives  . Sulfamethoxazole-Trimethoprim     REACTION: hives  . Sulfonamide Derivatives     REACTION: hives   Sent to Dr. Maple Hudson in MR absence.

## 2014-10-22 NOTE — Telephone Encounter (Signed)
Offer doxycycline 100 mg, # 8, 2 today then one daily 

## 2014-10-23 MED ORDER — DOXYCYCLINE HYCLATE 100 MG PO TABS: ORAL_TABLET | ORAL | Status: DC

## 2014-10-23 NOTE — Telephone Encounter (Signed)
Patient notified. rx sent to pharmacy. Nothing further needed.  

## 2014-10-23 NOTE — Telephone Encounter (Signed)
lmtcb

## 2014-10-23 NOTE — Telephone Encounter (Signed)
Katina Degree is returning call for pt - [phone]

## 2014-11-24 ENCOUNTER — Other Ambulatory Visit: Payer: Self-pay | Admitting: Internal Medicine

## 2015-01-14 ENCOUNTER — Telehealth: Payer: Self-pay | Admitting: Internal Medicine

## 2015-01-14 MED ORDER — DOXYCYCLINE HYCLATE 100 MG PO TABS: ORAL_TABLET | ORAL | Status: DC

## 2015-01-14 MED ORDER — TRAMADOL HCL 50 MG PO TABS
50.0000 mg | ORAL_TABLET | Freq: Three times a day (TID) | ORAL | Status: DC | PRN
Start: 2015-01-14 — End: 2015-03-12

## 2015-01-14 NOTE — Telephone Encounter (Signed)
Rx sent to pharmacy. Patient's caregiver notified. Nothing further needed.

## 2015-01-14 NOTE — Telephone Encounter (Signed)
lmtcb

## 2015-01-14 NOTE — Telephone Encounter (Signed)
Patient's caretaker returned call.  Can be reached at [phone].

## 2015-01-14 NOTE — Telephone Encounter (Signed)
Offer doxycycline 100 mg, # 8, 2 today then one daily           Tramadol 50 mg, # 30, 1 every 8 hours as needed               Can also take Mucinex-DM or Delsym and use throat lozenges

## 2015-01-14 NOTE — Telephone Encounter (Signed)
Patient has cough x 2 weeks.  Using OTC medication and it has not helped.  Yellow phlegm.    Allergies  Allergen Reactions  . Codeine Anaphylaxis  . Penicillins     REACTION: hives  . Sulfamethoxazole-Trimethoprim     REACTION: hives  . Sulfonamide Derivatives     REACTION: hives   Current Outpatient Prescriptions on File Prior to Visit  Medication Sig Dispense Refill  . albuterol (PROVENTIL HFA;VENTOLIN HFA) 108 (90 BASE) MCG/ACT inhaler Inhale 2 puffs into the lungs every 6 (six) hours as needed for wheezing or shortness of breath.    . alfuzosin (UROXATRAL) 10 MG 24 hr tablet Take 10 mg by mouth at bedtime.     Marland Kitchen azithromycin (ZITHROMAX) 250 MG tablet Take 1 tablet (250 mg total) by mouth as directed. 6 tablet 0  . b complex vitamins tablet Take 1 tablet by mouth daily.      . bimatoprost (LUMIGAN) 0.01 % SOLN Place 1 drop into the left eye at bedtime.    . brimonidine (ALPHAGAN) 0.15 % ophthalmic solution Place 1 drop into the left eye 2 (two) times daily.     . cholestyramine (QUESTRAN) 4 G packet Take 1 packet by mouth 2 (two) times daily.     . Choline Fenofibrate (TRILIPIX) 135 MG capsule Take 135 mg by mouth daily.     . clonazePAM (KLONOPIN) 1 MG tablet Take 1 mg by mouth 3 (three) times daily as needed. Anxiety    . dicyclomine (BENTYL) 10 MG capsule Take 10 mg by mouth 4 (four) times daily -  before meals and at bedtime.     . diphenhydrAMINE (SOMINEX) 25 MG tablet Take 50 mg by mouth at bedtime as needed for sleep.     . dorzolamide-timolol (COSOPT) 22.3-6.8 MG/ML ophthalmic solution Place 1 drop into the left eye 2 (two) times daily.    Marland Kitchen doxycycline (VIBRA-TABS) 100 MG tablet Take 2 tablets first day, then 1 daily until gone. 8 tablet 0  . dutasteride (AVODART) 0.5 MG capsule Take 0.5 mg by mouth daily.      Marland Kitchen esomeprazole (NEXIUM) 20 MG capsule Take 20 mg by mouth 2 (two) times daily.     . fesoterodine (TOVIAZ) 8 MG TB24 tablet Take 8 mg by mouth at bedtime.    Marland Kitchen  loxapine (LOXITANE) 10 MG capsule Take 10 mg by mouth 2 (two) times daily.     . mercaptopurine (PURINETHOL) 50 MG tablet Take 75 mg by mouth daily. 1 tablet and a half tablet Give on an empty stomach 1 hour before or 2 hours after meals. Caution: Chemotherapy.    . mesalamine (PENTASA) 250 MG CR capsule Take 1,000 mg by mouth 3 (three) times daily.     . mirabegron ER (MYRBETRIQ) 50 MG TB24 tablet Take 50 mg by mouth daily.    . ondansetron (ZOFRAN) 8 MG tablet Take 8 mg by mouth 2 (two) times daily.    . potassium chloride (KLOR-CON) 10 MEQ CR tablet Take 10 mEq by mouth 2 (two) times daily.     Marland Kitchen PROAIR HFA 108 (90 BASE) MCG/ACT inhaler USE 2 PUFFS BY MOUTH EVERY 6 HOURS AS NEEDED 8.5 g 0  . rosuvastatin (CRESTOR) 20 MG tablet Take 20 mg by mouth every morning.    . sucralfate (CARAFATE) 1 G tablet Take 1 tablet (1 g total) by mouth 4 (four) times daily -  with meals and at bedtime. 15 tablet 0  . tiotropium (SPIRIVA HANDIHALER)  18 MCG inhalation capsule INHALE THE CONTENT OF 1 CAPSULE VIA HANDIHALER DAILY 30 capsule 5  . traZODone (DESYREL) 50 MG tablet Take 150 mg by mouth at bedtime.     No current facility-administered medications on file prior to visit.

## 2015-02-04 ENCOUNTER — Other Ambulatory Visit: Payer: Self-pay | Admitting: Internal Medicine

## 2015-02-08 ENCOUNTER — Ambulatory Visit: Payer: Medicare Other | Admitting: Adult Health

## 2015-03-03 ENCOUNTER — Encounter: Payer: Self-pay | Admitting: Cardiovascular Disease

## 2015-03-10 ENCOUNTER — Telehealth: Payer: Self-pay | Admitting: Internal Medicine

## 2015-03-10 MED ORDER — ALBUTEROL SULFATE HFA 108 (90 BASE) MCG/ACT IN AERS
2.0000 | INHALATION_SPRAY | Freq: Four times a day (QID) | RESPIRATORY_TRACT | Status: DC | PRN
Start: 2015-03-10 — End: 2015-06-21

## 2015-03-10 NOTE — Telephone Encounter (Signed)
Attempted to call the caller of this message, but she is not in the office. Not able to reach her at that number since she is a driver and not ever in the office. Refills of the albuterol have been sent to the pharmacy for the pt.  The pt has a pending appt with MR on 9/23

## 2015-03-12 ENCOUNTER — Ambulatory Visit (INDEPENDENT_AMBULATORY_CARE_PROVIDER_SITE_OTHER)
Admission: RE | Admit: 2015-03-12 | Discharge: 2015-03-12 | Disposition: A | Discharge status: Home / Self Care | Payer: Medicare Other | Source: Ambulatory Visit | Attending: Internal Medicine | Admitting: Internal Medicine

## 2015-03-12 ENCOUNTER — Ambulatory Visit (INDEPENDENT_AMBULATORY_CARE_PROVIDER_SITE_OTHER): Payer: Medicare Other | Admitting: Internal Medicine

## 2015-03-12 ENCOUNTER — Encounter: Payer: Self-pay | Admitting: Internal Medicine

## 2015-03-12 VITALS — BP 138/74 | HR 92 | Ht 71.5 in | Wt 186.0 lb

## 2015-03-12 DIAGNOSIS — J441 Chronic obstructive pulmonary disease with (acute) exacerbation: Secondary | ICD-10-CM

## 2015-03-12 DIAGNOSIS — Z720 Tobacco use: Secondary | ICD-10-CM | POA: Diagnosis not present

## 2015-03-12 IMAGING — CR DG CHEST 2V
2 series · 2 of 2 positions shown · non-contrast
Comparison: [DATE]

CLINICAL DATA: Cough for 3 months, history of tobacco use

EXAM:
CHEST - 2 VIEW

[view not recorded (1 of 2)]
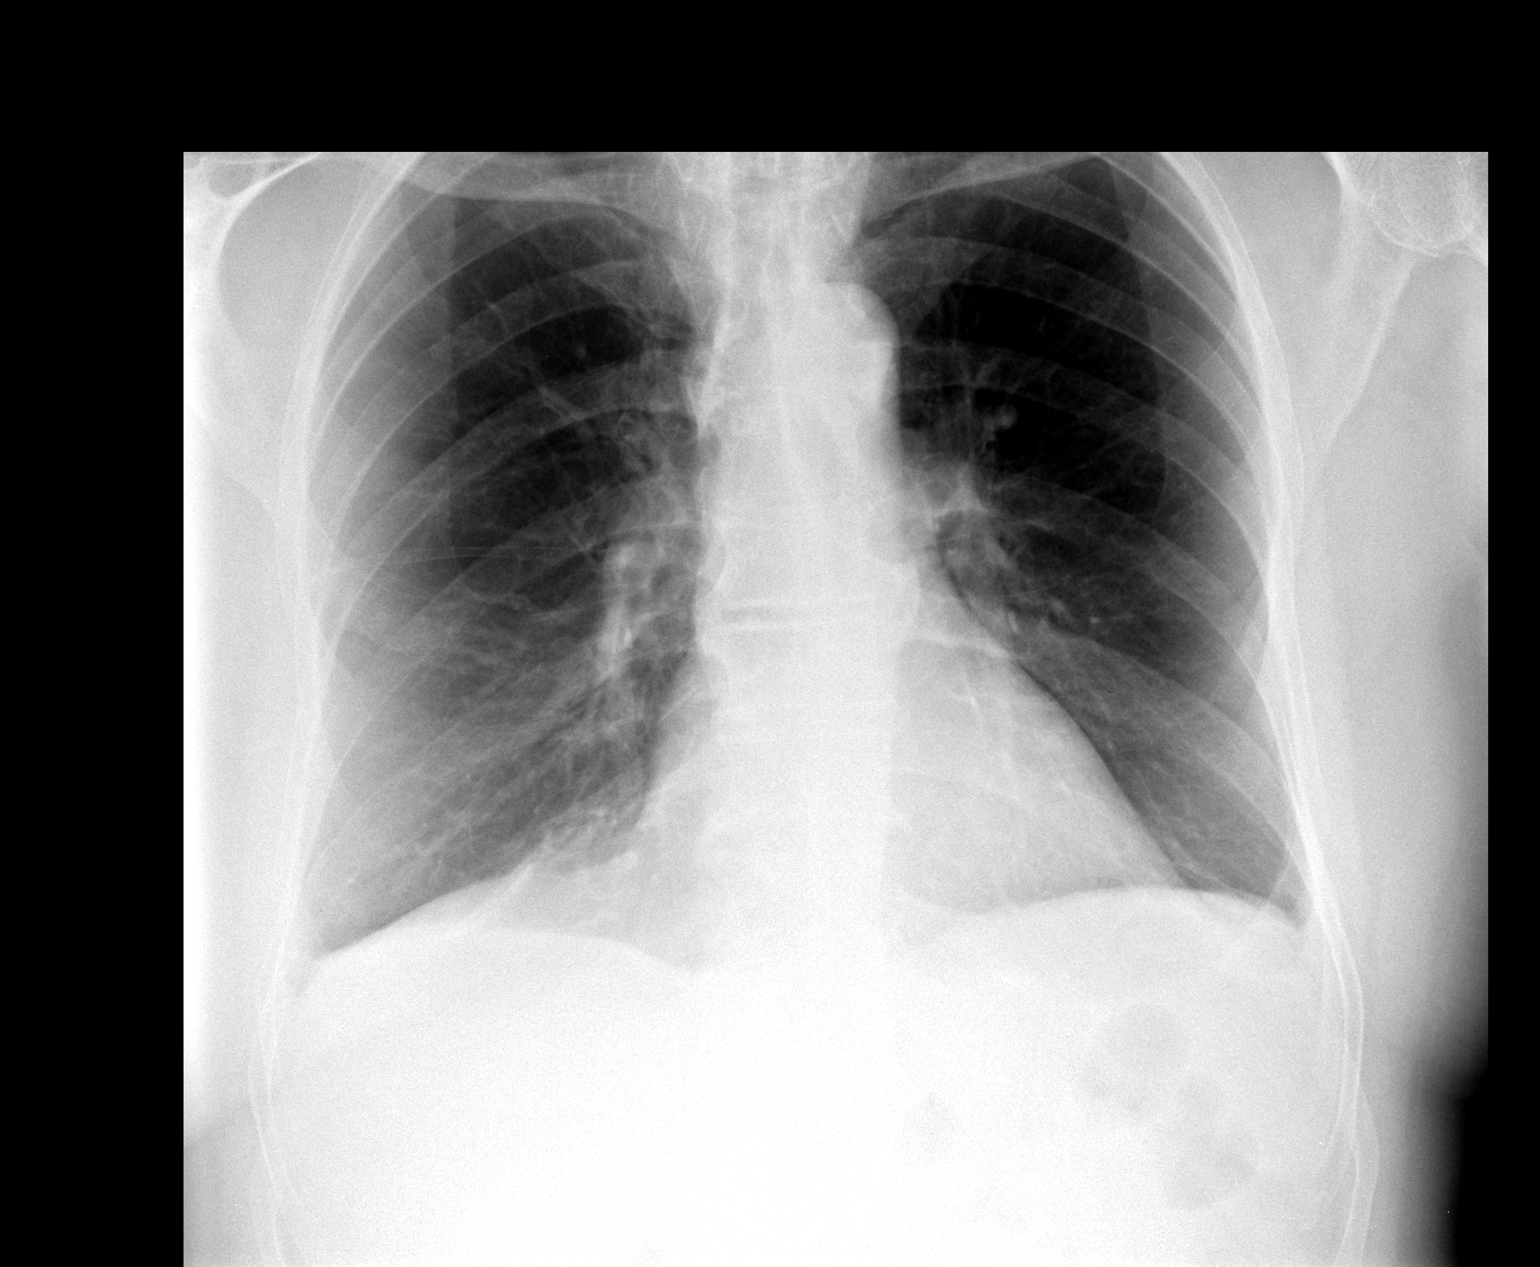

[view not recorded (2 of 2)]
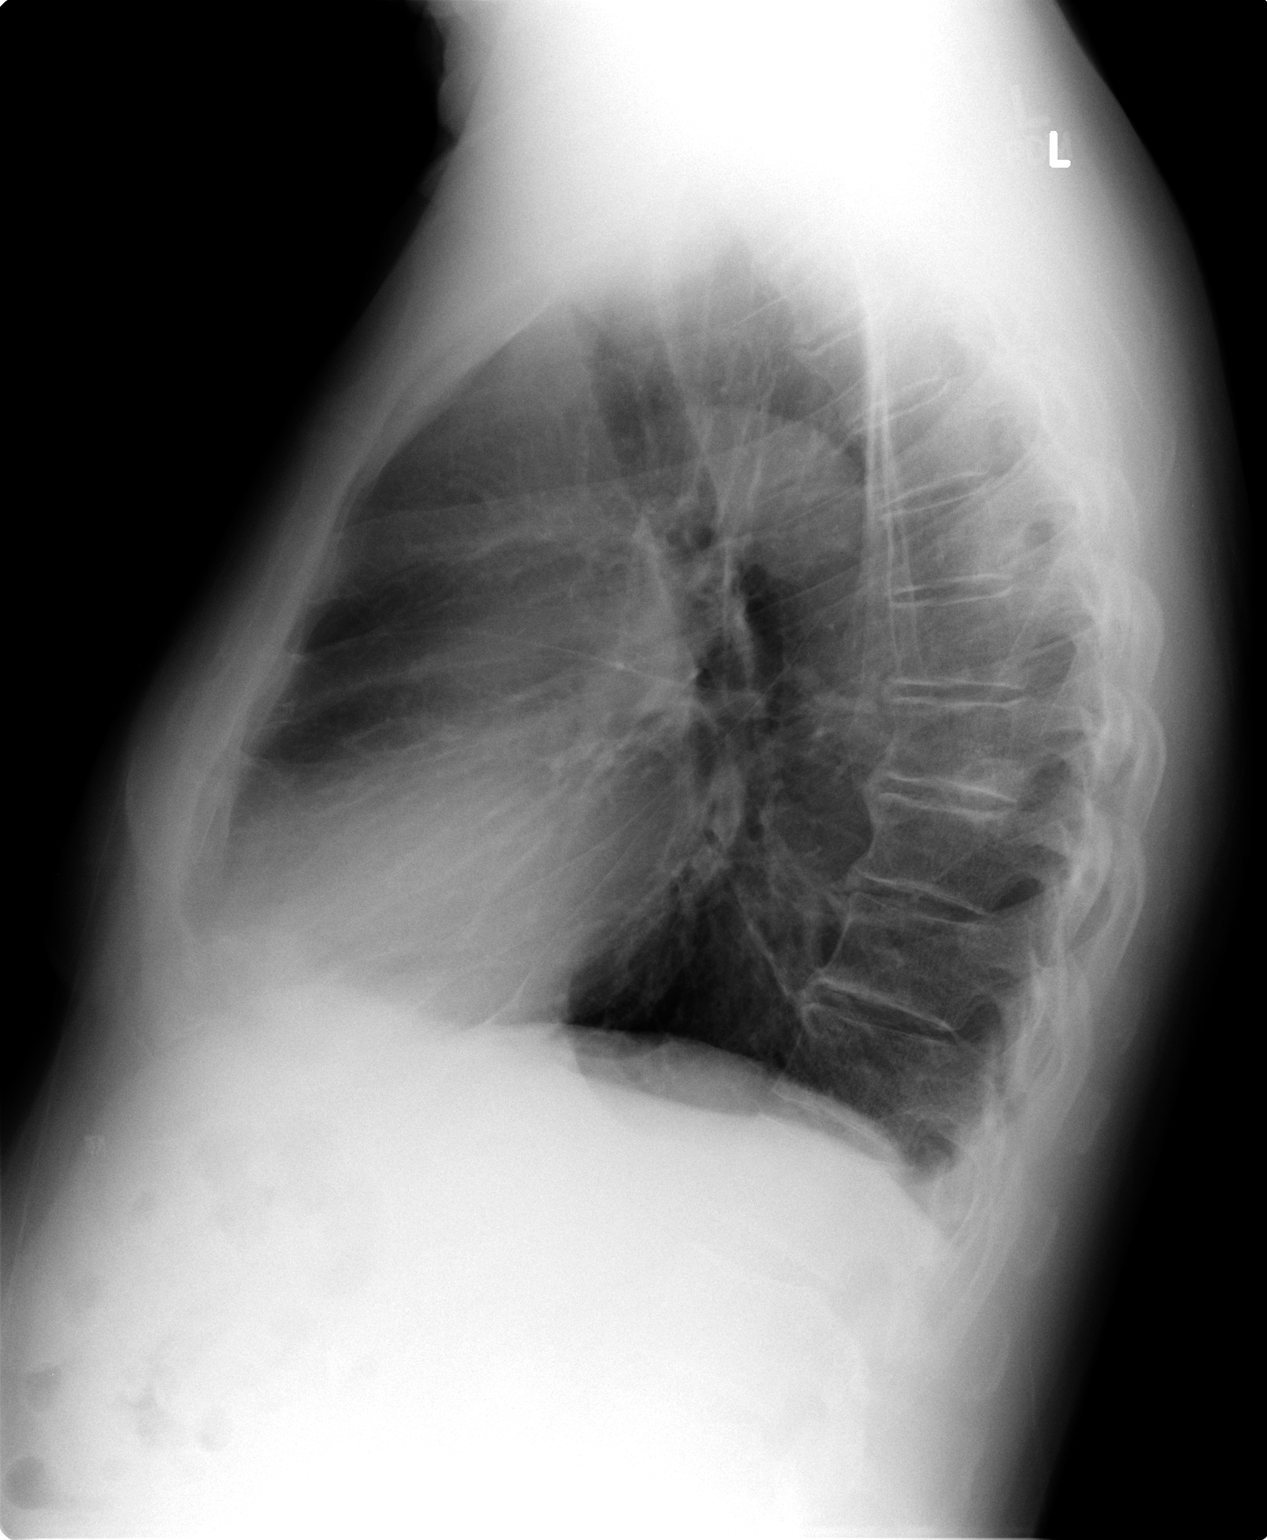

[2 of 2 positions shown; findings below may reference images not displayed]

FINDINGS: Cardiac shadow is within normal limits. The lungs are well aerated
bilaterally. No focal infiltrate or sizable effusion is seen.
Degenerative changes of the thoracic spine are noted.
IMPRESSION: No active disease.

## 2015-03-12 MED ORDER — DOXYCYCLINE HYCLATE 100 MG PO TABS: ORAL_TABLET | ORAL | Status: DC

## 2015-03-12 MED ORDER — ALBUTEROL SULFATE (2.5 MG/3ML) 0.083% IN NEBU: 2.5000 mg | INHALATION_SOLUTION | Freq: Four times a day (QID) | RESPIRATORY_TRACT | Status: DC | PRN

## 2015-03-12 MED ORDER — PREDNISONE 10 MG PO TABS: ORAL_TABLET | ORAL | Status: DC

## 2015-03-12 NOTE — Patient Instructions (Addendum)
ICD-9-CM ICD-10-CM   1. COPD exacerbation 491.21 J44.1   2. Tobacco abuse 305.1 Z72.0    You have mild attack of copd called COPD exacerbation Do CXR 2 view Please take doxycycline 134m twice daily after meals x 5 days; avoid sunlight Please take Please take prednisone 40 mg x1 day, then 30 mg x1 day, then 20 mg x1 day, then 10 mg x1 day, and then 5 mg x1 day and stop Have flu shot next week at your place Continue spiriva as before  Quit smoking  Followup 6 months with my NP Tammy or sooner if needed

## 2015-03-12 NOTE — Addendum Note (Signed)
Addended by: Nicanor Alcon on: 03/12/2015 05:30 PM   Modules accepted: Orders

## 2015-03-12 NOTE — Progress Notes (Signed)
Subjective:    Patient ID: Chad PLEMMONS, male    DOB: 1956/02/01, 59 y.o.   MRN: [id number]  HPI     OV 03/12/2015  Chief Complaint  Patient presents with  . Acute Visit    Pt c/o increase in cough with white mucus - worse at night. Pt denies change in SOB, CP/tightness, f/c/s.     59 year old male with significant psychiatric history. Active smoker. Normally accompanied by his caretaker. Today he is sitting here by himself. January 2016 already function test shows isolated reduction in diffusion capacity. Last chest x-ray December 2015 was clear. This is a routine follow-up but he is reporting one month of worsening shortness of breath, cough, increase in sputum volume but no change in sputum color and increased wheezing and chest tightness. He says he has cut down his smoking. There is no fever or chills or hemoptysis. He says is compliant with the Spiriva.   reports that he has been smoking Cigarettes.  He has a 27 pack-year smoking history. He has never used smokeless tobacco.  Allergies  Allergen Reactions  . Codeine Anaphylaxis  . Penicillins     REACTION: hives  . Sulfamethoxazole-Trimethoprim     REACTION: hives  . Sulfonamide Derivatives     REACTION: hives     Immunization History  Administered Date(s) Administered  . Influenza Split 04/06/2011, 03/12/2012  . Influenza Whole 05/10/2009  . Influenza,inj,Quad PF,36+ Mos 03/04/2013  . Influenza-Unspecified 04/19/2014  . Pneumococcal Conjugate-13 01/27/2009  . Pneumococcal Polysaccharide-23 06/19/2008      Current outpatient prescriptions:  .  albuterol (PROVENTIL HFA;VENTOLIN HFA) 108 (90 BASE) MCG/ACT inhaler, Inhale 2 puffs into the lungs every 6 (six) hours as needed for wheezing or shortness of breath., Disp: 1 Inhaler, Rfl: 1 .  alfuzosin (UROXATRAL) 10 MG 24 hr tablet, Take 10 mg by mouth at bedtime. , Disp: , Rfl:  .  b complex vitamins tablet, Take 1 tablet by mouth daily.  , Disp: , Rfl:  .   bimatoprost (LUMIGAN) 0.01 % SOLN, Place 1 drop into the left eye at bedtime., Disp: , Rfl:  .  brimonidine (ALPHAGAN) 0.15 % ophthalmic solution, Place 1 drop into the left eye 2 (two) times daily. , Disp: , Rfl:  .  cholestyramine (QUESTRAN) 4 G packet, Take 1 packet by mouth 2 (two) times daily. , Disp: , Rfl:  .  clonazePAM (KLONOPIN) 1 MG tablet, Take 1 mg by mouth 3 (three) times daily as needed. Anxiety, Disp: , Rfl:  .  dicyclomine (BENTYL) 10 MG capsule, Take 10 mg by mouth 4 (four) times daily -  before meals and at bedtime. , Disp: , Rfl:  .  diphenhydrAMINE (SOMINEX) 25 MG tablet, Take 50 mg by mouth at bedtime as needed for sleep. , Disp: , Rfl:  .  dorzolamide-timolol (COSOPT) 22.3-6.8 MG/ML ophthalmic solution, Place 1 drop into the left eye 2 (two) times daily., Disp: , Rfl:  .  dutasteride (AVODART) 0.5 MG capsule, Take 0.5 mg by mouth daily.  , Disp: , Rfl:  .  esomeprazole (NEXIUM) 20 MG capsule, Take 20 mg by mouth 2 (two) times daily. , Disp: , Rfl:  .  fesoterodine (TOVIAZ) 8 MG TB24 tablet, Take 8 mg by mouth at bedtime., Disp: , Rfl:  .  loxapine (LOXITANE) 10 MG capsule, Take 10 mg by mouth 2 (two) times daily. , Disp: , Rfl:  .  mercaptopurine (PURINETHOL) 50 MG tablet, Take 75 mg by mouth daily.  1 tablet and a half tablet Give on an empty stomach 1 hour before or 2 hours after meals. Caution: Chemotherapy., Disp: , Rfl:  .  mesalamine (PENTASA) 250 MG CR capsule, Take 1,000 mg by mouth 3 (three) times daily. , Disp: , Rfl:  .  mirabegron ER (MYRBETRIQ) 50 MG TB24 tablet, Take 50 mg by mouth daily., Disp: , Rfl:  .  ondansetron (ZOFRAN) 8 MG tablet, Take 8 mg by mouth 2 (two) times daily., Disp: , Rfl:  .  potassium chloride (KLOR-CON) 10 MEQ CR tablet, Take 10 mEq by mouth 2 (two) times daily. , Disp: , Rfl:  .  PROAIR HFA 108 (90 BASE) MCG/ACT inhaler, USE 2 PUFFS BY MOUTH EVERY 6 HOURS AS NEEDED, Disp: 8.5 g, Rfl: 0 .  rosuvastatin (CRESTOR) 20 MG tablet, Take 20 mg by  mouth every morning., Disp: , Rfl:  .  SPIRIVA HANDIHALER 18 MCG inhalation capsule, INHALE THE CONTENT OF 1 CAPSULE VIA HANDIHALER DAILY, Disp: 30 capsule, Rfl: 2 .  traZODone (DESYREL) 50 MG tablet, Take 150 mg by mouth at bedtime., Disp: , Rfl:    Review of Systems  Constitutional: Negative for fever and unexpected weight change.  HENT: Negative for congestion, dental problem, ear pain, nosebleeds, postnasal drip, rhinorrhea, sinus pressure, sneezing, sore throat and trouble swallowing.   Eyes: Negative for redness and itching.  Respiratory: Positive for cough and shortness of breath. Negative for chest tightness and wheezing.   Cardiovascular: Negative for palpitations and leg swelling.  Gastrointestinal: Negative for nausea and vomiting.  Genitourinary: Negative for dysuria.  Musculoskeletal: Negative for joint swelling.  Skin: Negative for rash.  Neurological: Negative for headaches.  Hematological: Does not bruise/bleed easily.  Psychiatric/Behavioral: Negative for dysphoric mood. The patient is not nervous/anxious.        Objective:   Physical Exam  Constitutional: He is oriented to person, place, and time. He appears well-developed and well-nourished. No distress.  HENT:  Head: Normocephalic and atraumatic.  Right Ear: External ear normal.  Left Ear: External ear normal.  Mouth/Throat: Oropharynx is clear and moist. No oropharyngeal exudate.  Eyes: Conjunctivae and EOM are normal. Pupils are equal, round, and reactive to light. Right eye exhibits no discharge. Left eye exhibits no discharge. No scleral icterus.  Neck: Normal range of motion. Neck supple. No JVD present. No tracheal deviation present. No thyromegaly present.  Cardiovascular: Normal rate, regular rhythm and intact distal pulses.  Exam reveals no gallop and no friction rub.   No murmur heard. Pulmonary/Chest: Effort normal and breath sounds normal. No respiratory distress. He has no wheezes. He has no rales. He  exhibits no tenderness.  Abdominal: Soft. Bowel sounds are normal. He exhibits no distension and no mass. There is no tenderness. There is no rebound and no guarding.  Musculoskeletal: Normal range of motion. He exhibits no edema or tenderness.  Lymphadenopathy:    He has no cervical adenopathy.  Neurological: He is alert and oriented to person, place, and time. He has normal reflexes. No cranial nerve deficit. Coordination normal.  Skin: Skin is warm and dry. No rash noted. He is not diaphoretic. No erythema. No pallor.  Psychiatric:  Poor historian  Nursing note and vitals reviewed.    Filed Vitals:   03/12/15 1635  BP: 138/74  Pulse: 92  Height: 5' 11.5" (1.816 m)  Weight: 186 lb (84.369 kg)  SpO2: 96%           Assessment & Plan:  ICD-9-CM ICD-10-CM   1. COPD exacerbation 491.21 J44.1 DG Chest 2 View  2. Tobacco abuse 305.1 Z72.0 DG Chest 2 View    You have mild attack of copd called COPD exacerbation Do CXR 2 view Please take doxycycline 132m twice daily after meals x 5 days; avoid sunlight Please take Please take prednisone 40 mg x1 day, then 30 mg x1 day, then 20 mg x1 day, then 10 mg x1 day, and then 5 mg x1 day and stop Have flu shot next week at your place Continue spiriva as before  Quit smoking  Followup 6 months with my NP Tammy or sooner if needed    Dr. MBrand Males M.D., FCarepoint Health-Christ HospitalC.P Pulmonary and Critical Care Medicine Staff Physician CWaylandPulmonary and Critical Care Pager: [phone] If no answer or between  15:00h - 7:00h: call [phone]  03/12/2015 4:56 PM

## 2015-03-15 ENCOUNTER — Telehealth: Payer: Self-pay | Admitting: Internal Medicine

## 2015-03-15 NOTE — Telephone Encounter (Signed)
Chad Moss cb please cb at previous number listed

## 2015-03-15 NOTE — Telephone Encounter (Signed)
lmtcb for Chad Moss.

## 2015-03-15 NOTE — Telephone Encounter (Signed)
Verbal order given to Dianne to change rx. Nothing further was needed.

## 2015-03-15 NOTE — Telephone Encounter (Signed)
lmtcb x1 for Chad Moss.

## 2015-03-15 NOTE — Telephone Encounter (Signed)
[phone] ext [phone]  [facility]

## 2015-03-29 NOTE — Progress Notes (Signed)
Quick Note:  Contacted pt, advised per Dr. Marchelle Gearing CXR was clear. Pt expressed understanding with no concerns. ______

## 2015-06-21 ENCOUNTER — Other Ambulatory Visit: Payer: Self-pay | Admitting: Internal Medicine

## 2015-07-02 ENCOUNTER — Telehealth: Payer: Self-pay | Admitting: Internal Medicine

## 2015-07-02 MED ORDER — PREDNISONE 10 MG PO TABS: ORAL_TABLET | ORAL | Status: DC

## 2015-07-02 MED ORDER — AZITHROMYCIN 250 MG PO TABS: 250.0000 mg | ORAL_TABLET | ORAL | Status: DC

## 2015-07-02 NOTE — Telephone Encounter (Signed)
Sending to DOD as this has not yet been addressed. MW please advise on recs.  Thanks!

## 2015-07-02 NOTE — Telephone Encounter (Signed)
Spoke with pt's EC, c/o sinus congestion, pnd, sore throat, prod cough with yellow mucus, chest tightness X1 week. Denies fever, chest pain, chills.   Pt has not taken anything to help with s/s. Pt uses [facility] on [address]    MR please advise on recs.  Thanks.

## 2015-07-02 NOTE — Telephone Encounter (Signed)
zpak Prednisone 10 mg take  4 each am x 2 days,   2 each am x 2 days,  1 each am x 2 days and stop  

## 2015-07-02 NOTE — Telephone Encounter (Signed)
[phone], pt cb

## 2015-07-02 NOTE — Telephone Encounter (Signed)
Spoke with Cherly Hensen, aware that abx and Pred is being sent to pharmacy. Verified pharmacy. Nothing further needed.

## 2015-07-02 NOTE — Telephone Encounter (Signed)
atc pt, no answer, mailbox full.  Wcb.

## 2015-08-09 ENCOUNTER — Encounter: Payer: Self-pay | Admitting: Internal Medicine

## 2015-08-09 ENCOUNTER — Ambulatory Visit (INDEPENDENT_AMBULATORY_CARE_PROVIDER_SITE_OTHER): Payer: Medicare Other | Admitting: Internal Medicine

## 2015-08-09 ENCOUNTER — Telehealth: Payer: Self-pay | Admitting: Internal Medicine

## 2015-08-09 VITALS — BP 138/68 | HR 115 | Temp 102.1°F | Ht 71.5 in | Wt 197.6 lb

## 2015-08-09 DIAGNOSIS — R6889 Other general symptoms and signs: Secondary | ICD-10-CM

## 2015-08-09 DIAGNOSIS — R059 Cough, unspecified: Secondary | ICD-10-CM

## 2015-08-09 DIAGNOSIS — R05 Cough: Secondary | ICD-10-CM | POA: Diagnosis not present

## 2015-08-09 DIAGNOSIS — J018 Other acute sinusitis: Secondary | ICD-10-CM

## 2015-08-09 DIAGNOSIS — J019 Acute sinusitis, unspecified: Secondary | ICD-10-CM | POA: Insufficient documentation

## 2015-08-09 HISTORY — DX: Cough, unspecified: R05.9

## 2015-08-09 HISTORY — DX: Other general symptoms and signs: R68.89

## 2015-08-09 HISTORY — DX: Acute sinusitis, unspecified: J01.90

## 2015-08-09 LAB — POCT INFLUENZA A/B
Influenza A, POC: NEGATIVE
Influenza B, POC: NEGATIVE

## 2015-08-09 MED ORDER — OSELTAMIVIR PHOSPHATE 75 MG PO CAPS: 75.0000 mg | ORAL_CAPSULE | Freq: Two times a day (BID) | ORAL | Status: DC

## 2015-08-09 MED ORDER — LEVOFLOXACIN 500 MG PO TABS: 500.0000 mg | ORAL_TABLET | Freq: Every day | ORAL | Status: DC

## 2015-08-09 MED ORDER — PREDNISONE 10 MG PO TABS: ORAL_TABLET | ORAL | Status: DC

## 2015-08-09 NOTE — Progress Notes (Signed)
Subjective:     Patient ID: Chad Moss, male   DOB: 1956-01-17, 60 y.o.   MRN: [id number]  HPI  OV 08/09/2015  Chief Complaint  Patient presents with  . Acute Visit    Pt c/o sinus pain/pressure, sore throat, cough with yellow mucus, chills and vomiting. Most symptoms x4 days, vomiting began today. Pt denies body aches/wheeze/SOB/CP/tightness. Pt has not taken Spiriva in 4 weeks.     60 year old male with COPD. He presents with his caretaker. He says he has quit smoking. For the last 4 days is having sinus congestion pressure and sore throat. Today he went to a restaurant and vomited. He denies any fever but his temperature here is 102. He does not feel he is in COPD exacerbation but does admit to slight increase in cough and shortness of breath. Sputum is yellow for the last 4 days. Wheezes slightly more than usual. The caretaker says that the patient never complained about anything until she went to pick him up today. They do not feel that he needs were the emergency room or a second of admission  Flu swab polymerase chain reaction in the office is negative.  Immunization History  Administered Date(s) Administered  . Influenza Split 04/06/2011, 03/12/2012, 03/31/2015  . Influenza Whole 05/10/2009  . Influenza,inj,Quad PF,36+ Mos 03/04/2013  . Influenza-Unspecified 04/19/2014  . Pneumococcal Conjugate-13 01/27/2009  . Pneumococcal Polysaccharide-23 06/19/2008       has a past medical history of Dysphagia; Crohn's disease ([place]); IBS (irritable bowel syndrome); Hypertension; Hyperlipidemia; Allergic rhinitis; Paranoid schizophrenia ([place]); Glaucoma; Peripheral vascular disease ([place]); Dyspnea; History of pneumonia; Atelectasis; COPD (chronic obstructive pulmonary disease) ([place]); and Syncopal episodes.   reports that he has been smoking Cigarettes.  He has a 27 pack-year smoking history. He has never used smokeless tobacco.  Past Surgical History  Procedure Laterality Date  .  Cholecystectomy    . Colon surgery  1996    Allergies  Allergen Reactions  . Benztropine Anaphylaxis  . Codeine Anaphylaxis  . Meperidine Swelling  . Cyclobenzaprine Other (See Comments)  . Penicillins     REACTION: hives  . Sulfamethoxazole-Trimethoprim     REACTION: hives  . Sulfonamide Derivatives     REACTION: hives    Immunization History  Administered Date(s) Administered  . Influenza Split 04/06/2011, 03/12/2012, 03/31/2015  . Influenza Whole 05/10/2009  . Influenza,inj,Quad PF,36+ Mos 03/04/2013  . Influenza-Unspecified 04/19/2014  . Pneumococcal Conjugate-13 01/27/2009  . Pneumococcal Polysaccharide-23 06/19/2008    Family History  Problem Relation Age of Onset  . Hypertension Mother   . Heart disease Mother   . Hypertension Father   . Heart disease Father   . Lung cancer Brother     2006     Current outpatient prescriptions:  .  albuterol (PROVENTIL) (2.5 MG/3ML) 0.083% nebulizer solution, Take 3 mLs (2.5 mg total) by nebulization every 6 (six) hours as needed for wheezing or shortness of breath., Disp: 120 mL, Rfl: 12 .  alfuzosin (UROXATRAL) 10 MG 24 hr tablet, Take 10 mg by mouth at bedtime. , Disp: , Rfl:  .  b complex vitamins tablet, Take 1 tablet by mouth daily.  , Disp: , Rfl:  .  bimatoprost (LUMIGAN) 0.01 % SOLN, Place 1 drop into the left eye at bedtime., Disp: , Rfl:  .  brimonidine (ALPHAGAN) 0.15 % ophthalmic solution, Place 1 drop into the left eye 2 (two) times daily. , Disp: , Rfl:  .  cholestyramine (QUESTRAN) 4 G packet, Take 1  packet by mouth 2 (two) times daily. , Disp: , Rfl:  .  clonazePAM (KLONOPIN) 1 MG tablet, Take 1 mg by mouth 3 (three) times daily as needed. Anxiety, Disp: , Rfl:  .  dicyclomine (BENTYL) 10 MG capsule, Take 10 mg by mouth 4 (four) times daily -  before meals and at bedtime. , Disp: , Rfl:  .  diphenhydrAMINE (SOMINEX) 25 MG tablet, Take 50 mg by mouth at bedtime as needed for sleep. , Disp: , Rfl:  .   dorzolamide-timolol (COSOPT) 22.3-6.8 MG/ML ophthalmic solution, Place 1 drop into the left eye 2 (two) times daily., Disp: , Rfl:  .  dutasteride (AVODART) 0.5 MG capsule, Take 0.5 mg by mouth daily.  , Disp: , Rfl:  .  esomeprazole (NEXIUM) 20 MG capsule, Take 20 mg by mouth 2 (two) times daily. , Disp: , Rfl:  .  loxapine (LOXITANE) 10 MG capsule, Take 10 mg by mouth 2 (two) times daily. , Disp: , Rfl:  .  mercaptopurine (PURINETHOL) 50 MG tablet, Take 75 mg by mouth daily. 1 tablet and a half tablet Give on an empty stomach 1 hour before or 2 hours after meals. Caution: Chemotherapy., Disp: , Rfl:  .  mesalamine (PENTASA) 250 MG CR capsule, Take 1,000 mg by mouth 3 (three) times daily. , Disp: , Rfl:  .  mirabegron ER (MYRBETRIQ) 50 MG TB24 tablet, Take 50 mg by mouth daily., Disp: , Rfl:  .  ondansetron (ZOFRAN) 8 MG tablet, Take 8 mg by mouth 2 (two) times daily., Disp: , Rfl:  .  potassium chloride (KLOR-CON) 10 MEQ CR tablet, Take 10 mEq by mouth 2 (two) times daily. , Disp: , Rfl:  .  PROAIR HFA 108 (90 Base) MCG/ACT inhaler, INHALE 2 PUFFS INTO THE LUNGS EVERY 6 HOURS AS NEEDED FOR WHEEZING OR SHORTNESS OF BREATH, Disp: 8.5 g, Rfl: 1 .  rosuvastatin (CRESTOR) 20 MG tablet, Take 20 mg by mouth every morning., Disp: , Rfl:  .  traZODone (DESYREL) 50 MG tablet, Take 150 mg by mouth at bedtime., Disp: , Rfl:  .  fesoterodine (TOVIAZ) 8 MG TB24 tablet, Take 8 mg by mouth at bedtime., Disp: , Rfl:  .  SPIRIVA HANDIHALER 18 MCG inhalation capsule, INHALE THE CONTENT OF 1 CAPSULE VIA HANDIHALER DAILY (Patient not taking: Reported on 08/09/2015), Disp: 30 capsule, Rfl: 2     Review of Systems     Objective:   Physical Exam  Constitutional: He is oriented to person, place, and time. He appears well-developed and well-nourished. No distress.  HENT:  Head: Normocephalic and atraumatic.  Right Ear: External ear normal.  Left Ear: External ear normal.  Mouth/Throat: Oropharynx is clear and  moist. No oropharyngeal exudate.  Mild nasal congestion present  Eyes: Conjunctivae and EOM are normal. Pupils are equal, round, and reactive to light. Right eye exhibits no discharge. Left eye exhibits no discharge. No scleral icterus.  Neck: Normal range of motion. Neck supple. No JVD present. No tracheal deviation present. No thyromegaly present.  Cardiovascular: Normal rate, regular rhythm and intact distal pulses.  Exam reveals no gallop and no friction rub.   No murmur heard. Pulmonary/Chest: Effort normal and breath sounds normal. No respiratory distress. He has no wheezes. He has no rales. He exhibits no tenderness.  Abdominal: Soft. Bowel sounds are normal. He exhibits no distension and no mass. There is no tenderness. There is no rebound and no guarding.  Musculoskeletal: Normal range of motion. He exhibits  no edema or tenderness.  Lymphadenopathy:    He has no cervical adenopathy.  Neurological: He is alert and oriented to person, place, and time. He has normal reflexes. No cranial nerve deficit. Coordination normal.  Skin: Skin is warm and dry. No rash noted. He is not diaphoretic. No erythema. No pallor.  Psychiatric: He has a normal mood and affect. His behavior is normal. Judgment and thought content normal.  Nursing note and vitals reviewed.   Filed Vitals:   08/09/15 1608  BP: 138/68  Pulse: 115  Temp: 102.1 F (38.9 C)  TempSrc: Oral  Height: 5' 11.5" (1.816 m)  Weight: 197 lb 9.6 oz (89.631 kg)  SpO2: 95%        Assessment:       ICD-9-CM ICD-10-CM   1. Flu-like symptoms 780.99 R68.89   2. Cough 786.2 R05 POCT Influenza A/B  3. Other acute sinusitis 461.8 J01.80    I think his flu swab is negative is a false negative. I will treat him for flu. The might be an impending COPD exacerbation or early COPD exacerbations will treat for that as well as associated with acute sinusitis.     Plan:      Take prednisone 40 mg daily x 2 days, then 72m daily x 2 days,  then 157mdaily x 2 days, then 78m38maily x 2 days and stop   take levaquin 500m59mce daily  X 7 days  Tamiflu 778mg20mce daily x 5 days  If not better or getting worse go to ER    Dr. MuralBrand Males., F.C.CWester[facility] HospitalPulmonary [facility] Medicine Staff Physician [facility] Pager: [phone] answer or between  15:00h - 7:00h: call [phone]  08/09/2015 4:41 PM

## 2015-08-09 NOTE — Telephone Encounter (Signed)
Pt called back and scheduled appt 02/20 at 3:45p

## 2015-08-09 NOTE — Patient Instructions (Signed)
ICD-9-CM ICD-10-CM   1. Flu-like symptoms 780.99 R68.89   2. Cough 786.2 R05 POCT Influenza A/B  3. Other acute sinusitis 461.8 J01.80     Take prednisone 40 mg daily x 2 days, then 41m daily x 2 days, then 172mdaily x 2 days, then 32m26maily x 2 days and stop   take levaquin 500m32mce daily  X 7 days  Tamiflu 732mg50mce daily x 5 days  If not better or getting worse go to ER

## 2015-08-11 ENCOUNTER — Other Ambulatory Visit: Payer: Self-pay | Admitting: Internal Medicine

## 2015-09-13 ENCOUNTER — Encounter: Payer: Self-pay | Admitting: Internal Medicine

## 2015-09-13 ENCOUNTER — Ambulatory Visit (INDEPENDENT_AMBULATORY_CARE_PROVIDER_SITE_OTHER): Payer: Medicare Other | Admitting: Internal Medicine

## 2015-09-13 VITALS — BP 124/72 | HR 90 | Ht 71.5 in | Wt 208.4 lb

## 2015-09-13 DIAGNOSIS — J449 Chronic obstructive pulmonary disease, unspecified: Secondary | ICD-10-CM | POA: Diagnosis not present

## 2015-09-13 DIAGNOSIS — Z129 Encounter for screening for malignant neoplasm, site unspecified: Secondary | ICD-10-CM

## 2015-09-13 DIAGNOSIS — Z720 Tobacco use: Secondary | ICD-10-CM

## 2015-09-13 NOTE — Patient Instructions (Signed)
ICD-9-CM ICD-10-CM   1. Chronic obstructive pulmonary disease, unspecified COPD, unspecified chronic bronchitis type 496 J44.9   2. Tobacco abuse 305.1 Z72.0   3. Cancer screening V76.9 Z12.9     COPD  - Stable disease - Continue Spiriva daily -Use albuterol as needed  Smoking - To bad you relapsed. Please try to work on quitting smoking again  Lung cancer screening  - Do low-dose CT scan of the chest without contrast and we will call you with the results  Follow-up - 8 months or sooner if needed

## 2015-09-13 NOTE — Progress Notes (Signed)
Subjective:     Patient ID: Chad Moss, male   DOB: 06/24/1955, 60 y.o.   MRN: [id number]  HPI   OV 07/09/2014  Chief Complaint  Patient presents with  . Follow-up    Pt here after PFT. Pt stated his breathing is worse since last OV. Pt c/o increase in SOB, non prod cough. Pt denies CP/tightness.     Smoking:  reports that he has been smoking Cigarettes.  He has a 27 pack-year smoking history. He has never used smokeless tobacco.  Lung Cancer Screen: CT chest LDCT June 2014: no nodules.     COPD: COPD is stable. In December 2015 he had a COPD exacerbation and was seen in our office acutely. Currently he is resolved from this. Spirometry today actually shows normal spirometry but diffusion capacity is low at 56% and therefore he is isolated low diffusion capacity account of his emphysema.  New issue: He is reporting nonspecific abdominal pain for the last 1 week. He thinks is due to smoking. In the past ciprofloxacin resolve this. This will specific aggravating or relieving factors. It is mild and constant. There is no associated diarrhea or constipation fever or chills. No vomiting   OV 08/09/2015  Chief Complaint  Patient presents with  . Acute Visit    Pt c/o sinus pain/pressure, sore throat, cough with yellow mucus, chills and vomiting. Most symptoms x4 days, vomiting began today. Pt denies body aches/wheeze/SOB/CP/tightness. Pt has not taken Spiriva in 4 weeks.     60 year old male with COPD. He presents with his caretaker. He says he has quit smoking. For the last 4 days is having sinus congestion pressure and sore throat. Today he went to a restaurant and vomited. He denies any fever but his temperature here is 102. He does not feel he is in COPD exacerbation but does admit to slight increase in cough and shortness of breath. Sputum is yellow for the last 4 days. Wheezes slightly more than usual. The caretaker says that the patient never complained about anything until she  went to pick him up today. They do not feel that he needs were the emergency room or a second of admission  Flu swab polymerase chain reaction in the office is negative.   OV 09/13/2015  Chief Complaint  Patient presents with  . Follow-up    Pt states his breathing is back to baseline. Pt c/o prod cough with yellow mucus. Pt denies CP/tightness, wheezing, f/c/s.     COPD: Last visit was 08/09/2015. Had flulike symptoms and COPD exacerbation. Treated with antibiotics, Tamiflu and prednisone. Currently everything is resolved and is feeling well.    Smoking :  reports that he has been smoking Cigarettes.  He has a 27 pack-year smoking history. He has never used smokeless tobacco.  Cancer screen: #lung cancer screening I discussed screening Ct chest for early detection of lung cancer Explained that in age 22-75 and smoking history, annual low dose CT chest can pick up lung cancer early and has potential to save lives and cure lung cancer This is similar in concept to screening mammogram, colonoscopies and pap smears I explained Ct scan is low dose radiation I explained early lung cancer asymptomatic and only way to  detect is CT  With the real advantage that early lung cancer is curable through radiation or surgery I explained CT superior to CXR I explained that false positives are present and can incur cost and workup like biopsies, additional scan but benefit outweighs  risk   Past medical history According to the caretaker he recently received blood transfusions and endoscopy at [facility]-    Current outpatient prescriptions:  .  albuterol (PROVENTIL) (2.5 MG/3ML) 0.083% nebulizer solution, Take 3 mLs (2.5 mg total) by nebulization every 6 (six) hours as needed for wheezing or shortness of breath., Disp: 120 mL, Rfl: 12 .  alfuzosin (UROXATRAL) 10 MG 24 hr tablet, Take 10 mg by mouth at bedtime. , Disp: , Rfl:  .  b complex vitamins tablet, Take 1 tablet by mouth daily.  ,  Disp: , Rfl:  .  bimatoprost (LUMIGAN) 0.01 % SOLN, Place 1 drop into the left eye at bedtime., Disp: , Rfl:  .  brimonidine (ALPHAGAN) 0.15 % ophthalmic solution, Place 1 drop into the left eye 2 (two) times daily. , Disp: , Rfl:  .  cholestyramine (QUESTRAN) 4 G packet, Take 1 packet by mouth 2 (two) times daily as needed. , Disp: , Rfl:  .  clonazePAM (KLONOPIN) 1 MG tablet, Take 1 mg by mouth 3 (three) times daily as needed. Anxiety, Disp: , Rfl:  .  dicyclomine (BENTYL) 10 MG capsule, Take 10 mg by mouth 4 (four) times daily -  before meals and at bedtime. , Disp: , Rfl:  .  diphenhydrAMINE (BENADRYL) 25 MG tablet, Take 25 mg by mouth every 6 (six) hours as needed., Disp: , Rfl:  .  dorzolamide-timolol (COSOPT) 22.3-6.8 MG/ML ophthalmic solution, Place 1 drop into the left eye 2 (two) times daily., Disp: , Rfl:  .  esomeprazole (NEXIUM) 20 MG capsule, Take 20 mg by mouth 2 (two) times daily. , Disp: , Rfl:  .  loxapine (LOXITANE) 10 MG capsule, Take 10 mg by mouth 2 (two) times daily. , Disp: , Rfl:  .  mercaptopurine (PURINETHOL) 50 MG tablet, Take 75 mg by mouth daily. 1 tablet and a half tablet Give on an empty stomach 1 hour before or 2 hours after meals. Caution: Chemotherapy., Disp: , Rfl:  .  mesalamine (PENTASA) 250 MG CR capsule, Take 1,000 mg by mouth 3 (three) times daily. , Disp: , Rfl:  .  mirabegron ER (MYRBETRIQ) 50 MG TB24 tablet, Take 50 mg by mouth daily., Disp: , Rfl:  .  ondansetron (ZOFRAN) 8 MG tablet, Take 8 mg by mouth 2 (two) times daily., Disp: , Rfl:  .  potassium chloride (KLOR-CON) 10 MEQ CR tablet, Take 10 mEq by mouth 2 (two) times daily. , Disp: , Rfl:  .  PROAIR HFA 108 (90 Base) MCG/ACT inhaler, INHALE 2 PUFFS INTO THE LUNGS EVERY 6 HOURS AS NEEDED FOR WHEEZING OR SHORTNESS OF BREATH, Disp: 8.5 g, Rfl: 1 .  rosuvastatin (CRESTOR) 20 MG tablet, Take 20 mg by mouth every morning., Disp: , Rfl:  .  SPIRIVA HANDIHALER 18 MCG inhalation capsule, INHALE THE CONTENT  OF 1 CAPSULE VIA HANDIHALER DAILY, Disp: 30 capsule, Rfl: 2 .  traZODone (DESYREL) 50 MG tablet, Take 150 mg by mouth at bedtime., Disp: , Rfl:   Immunization History  Administered Date(s) Administered  . Influenza Split 04/06/2011, 03/12/2012, 03/31/2015  . Influenza Whole 05/10/2009  . Influenza,inj,Quad PF,36+ Mos 03/04/2013  . Influenza-Unspecified 04/19/2014  . Pneumococcal Conjugate-13 01/27/2009  . Pneumococcal Polysaccharide-23 06/19/2008    Allergies  Allergen Reactions  . Benztropine Anaphylaxis  . Codeine Anaphylaxis  . Meperidine Swelling  . Cyclobenzaprine Other (See Comments)  . Penicillins     REACTION: hives  . Sulfamethoxazole-Trimethoprim     REACTION:  hives  . Sulfonamide Derivatives     REACTION: hives     Review of Systems     Objective:   Physical Exam  Constitutional: He is oriented to person, place, and time. He appears well-developed and well-nourished. No distress.  HENT:  Head: Normocephalic and atraumatic.  Right Ear: External ear normal.  Left Ear: External ear normal.  Mouth/Throat: Oropharynx is clear and moist. No oropharyngeal exudate.  Eyes: Conjunctivae and EOM are normal. Pupils are equal, round, and reactive to light. Right eye exhibits no discharge. Left eye exhibits no discharge. No scleral icterus.  Neck: Normal range of motion. Neck supple. No JVD present. No tracheal deviation present. No thyromegaly present.  Cardiovascular: Normal rate, regular rhythm and intact distal pulses.  Exam reveals no gallop and no friction rub.   No murmur heard. Pulmonary/Chest: Effort normal and breath sounds normal. No respiratory distress. He has no wheezes. He has no rales. He exhibits no tenderness.  Abdominal: Soft. Bowel sounds are normal. He exhibits no distension and no mass. There is no tenderness. There is no rebound and no guarding.  Scar in abdomen +  Musculoskeletal: Normal range of motion. He exhibits no edema or tenderness.   Lymphadenopathy:    He has no cervical adenopathy.  Neurological: He is alert and oriented to person, place, and time. He has normal reflexes. No cranial nerve deficit. Coordination normal.  Skin: Skin is warm and dry. No rash noted. He is not diaphoretic. No erythema. No pallor.  Psychiatric:  Poor historian due to psych issues  Nursing note and vitals reviewed.   Filed Vitals:   09/13/15 1104  BP: 124/72  Pulse: 90  Height: 5' 11.5" (1.816 m)  Weight: 208 lb 6.4 oz (94.53 kg)  SpO2: 97%        Assessment:       ICD-9-CM ICD-10-CM   1. Chronic obstructive pulmonary disease, unspecified COPD, unspecified chronic bronchitis type 496 J44.9   2. Tobacco abuse 305.1 Z72.0   3. Cancer screening V76.9 Z12.9        Plan:      COPD  - Stable disease - Continue Spiriva daily -Use albuterol as needed  Smoking - To bad you relapsed. Please try to work on quitting smoking again  Lung cancer screening  - Do low-dose CT scan of the chest without contrast and we will call you with the results  Follow-up - 8 months or sooner if needed  Dr. Brand Males, M.D., [facility].C.P Pulmonary and Critical Care Medicine Staff Physician [place] [facility] Pager: [phone], If no answer or between  15:00h - 7:00h: call [phone]  09/13/2015 11:28 AM

## 2015-09-22 ENCOUNTER — Ambulatory Visit (INDEPENDENT_AMBULATORY_CARE_PROVIDER_SITE_OTHER)
Admission: RE | Admit: 2015-09-22 | Discharge: 2015-09-22 | Disposition: A | Discharge status: Home / Self Care | Payer: Medicare Other | Source: Ambulatory Visit | Attending: Internal Medicine | Admitting: Internal Medicine

## 2015-09-22 DIAGNOSIS — Z122 Encounter for screening for malignant neoplasm of respiratory organs: Secondary | ICD-10-CM

## 2015-09-22 DIAGNOSIS — F17210 Nicotine dependence, cigarettes, uncomplicated: Secondary | ICD-10-CM

## 2015-09-22 DIAGNOSIS — J449 Chronic obstructive pulmonary disease, unspecified: Secondary | ICD-10-CM

## 2015-09-22 IMAGING — CT CT CHEST LUNG CANCER SCREENING LOW DOSE W/O CM
2 of 5 series · 15 of 40 positions shown, 18 images · non-contrast
Comparison: [DATE].

CLINICAL DATA: Former smoker, quit [DATE], 46 pack-year
history.

EXAM:
CT CHEST WITHOUT CONTRAST LOW-DOSE FOR LUNG CANCER SCREENING
TECHNIQUE: Multidetector CT imaging of the chest was performed following the
standard protocol without IV contrast.

[Series 2: thorax 5.0 i31f 3 · axial · 0.71mm/px · z∈[-256,-21]mm · 12 of 53 slices shown, 15 images]
[im 3/53  mediastinal]
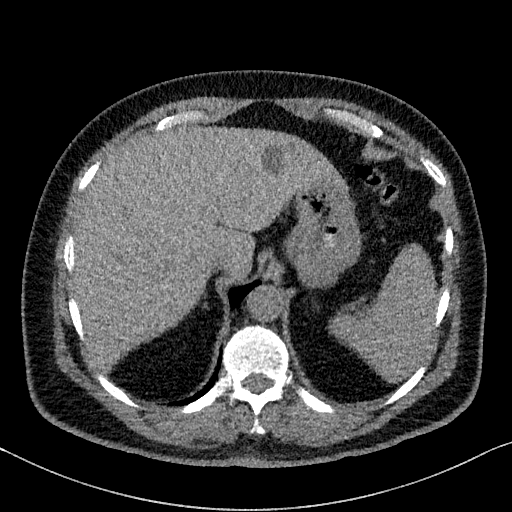
[im 3/53  lung]
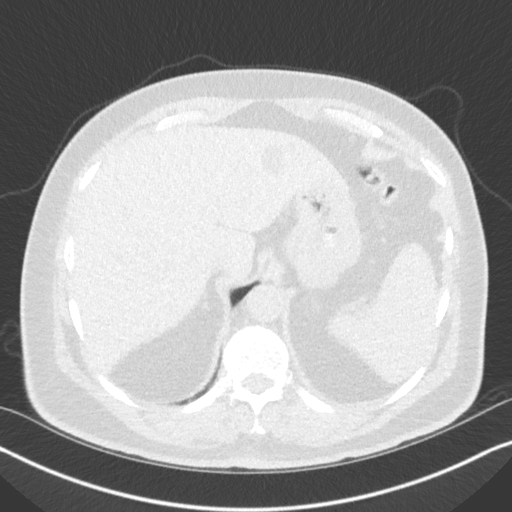
[im 8/53  lung]
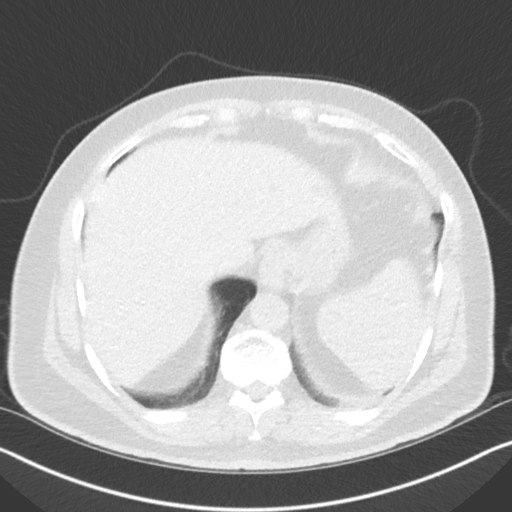
[im 11/53  lung]
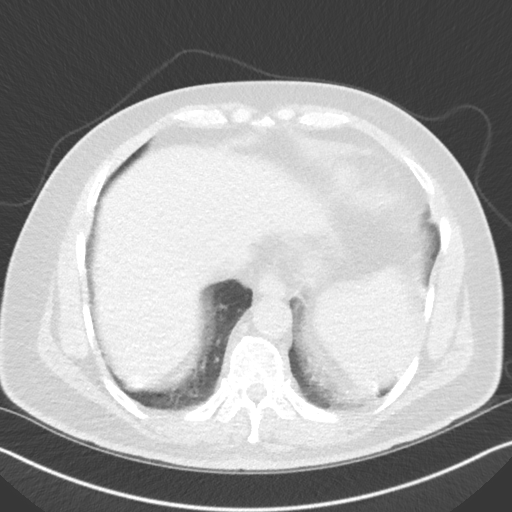
[im 16/53  lung]
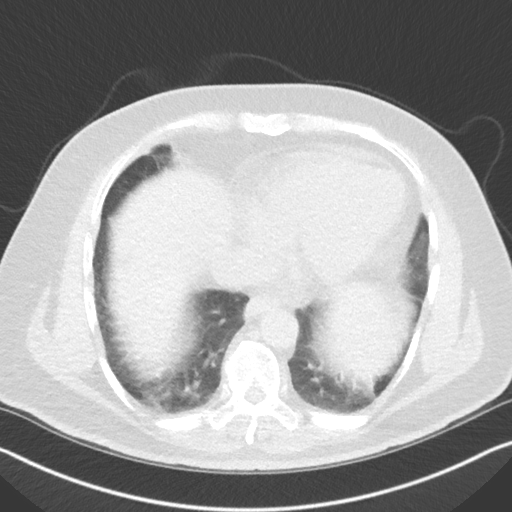
[im 21/53  mediastinal]
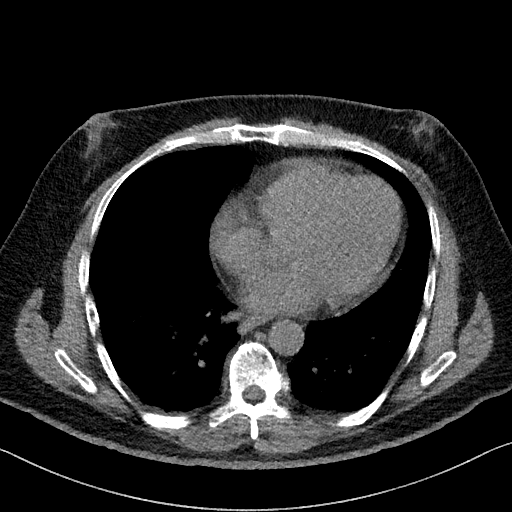
[im 21/53  lung]
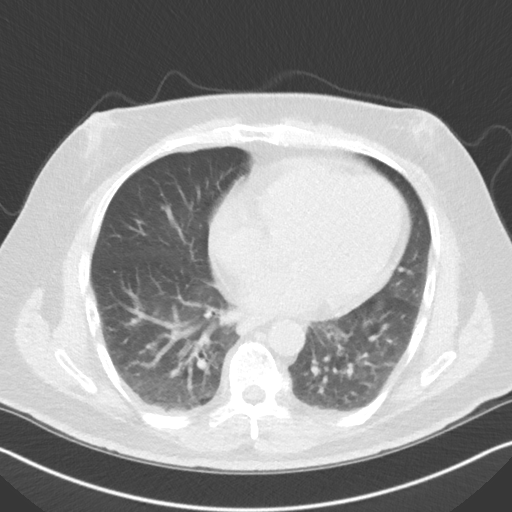
[im 24/53  lung]
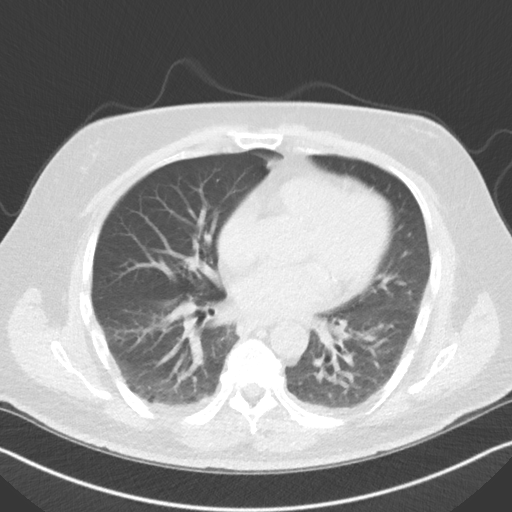
[im 29/53  lung]
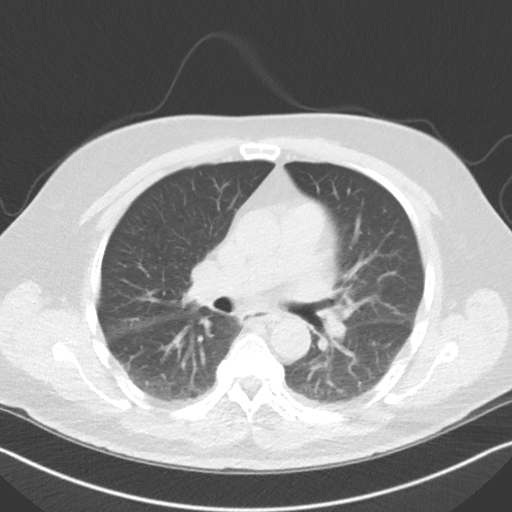
[im 32/53  lung]
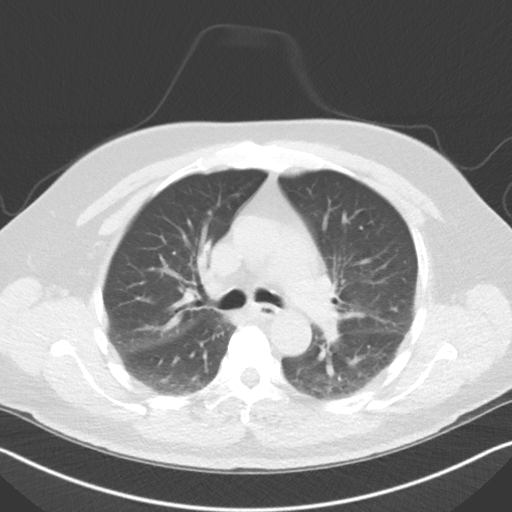
[im 37/53  mediastinal]
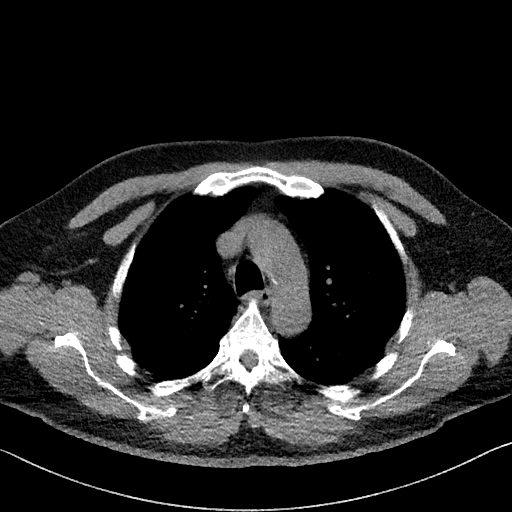
[im 37/53  lung]
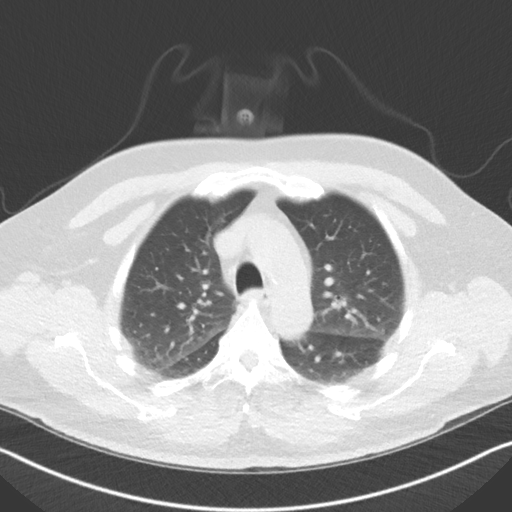
[im 42/53  lung]
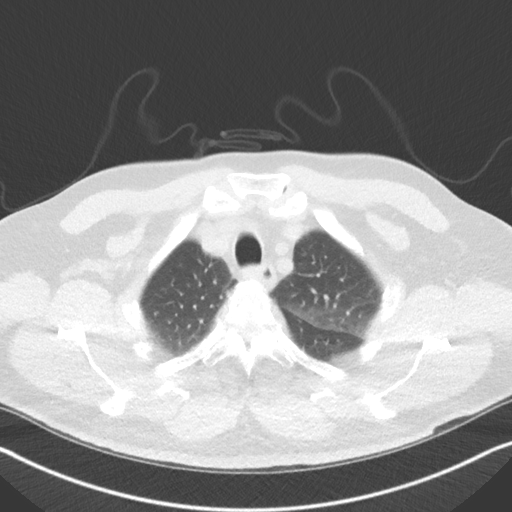
[im 45/53  lung]
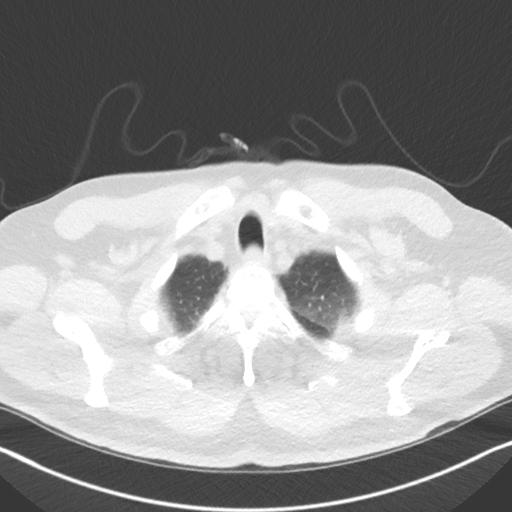
[im 50/53  lung]
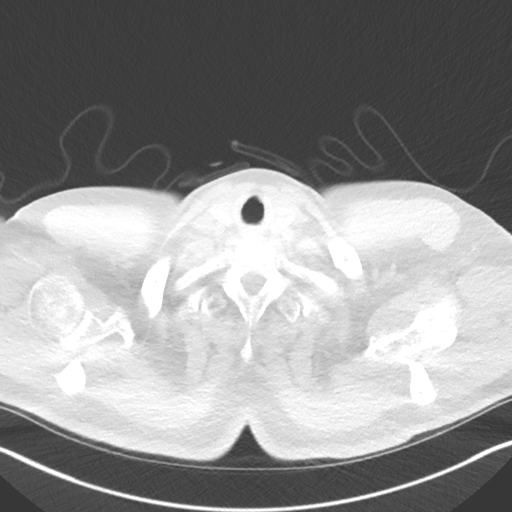

[Series 5: coronal · coronal · 0.59mm/px · 3 of 129 slices shown]
[im 26/129  lung]
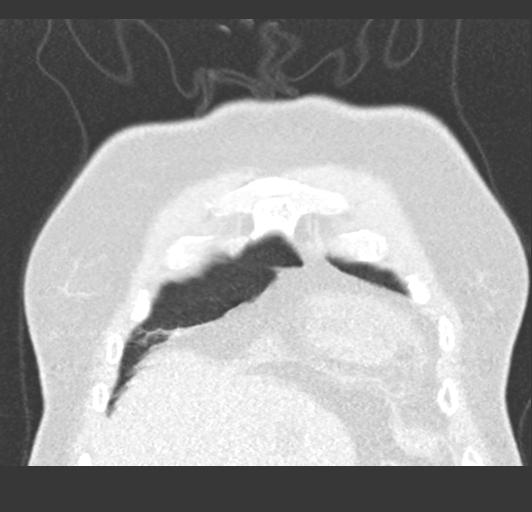
[im 52/129  lung]
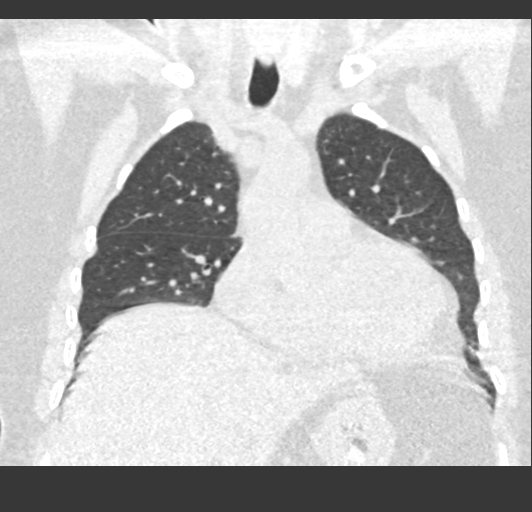
[im 77/129  lung]
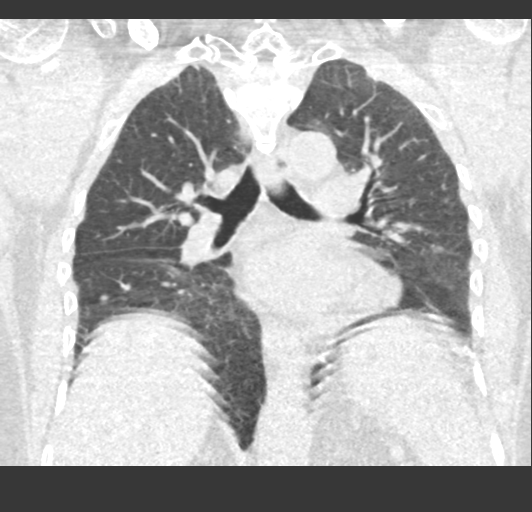

[15 of 40 positions shown; findings below may reference images not displayed]

FINDINGS: Mediastinum/Nodes: No pathologically enlarged mediastinal or
axillary lymph nodes. Hilar regions are difficult to definitively
evaluate without IV contrast. Heart is at the upper limits of normal
in size. No pericardial effusion.

Lungs/Pleura: 4 mm subpleural right middle lobe nodule (3/136) is
unchanged. No new pulmonary nodules. No pleural fluid. Airway is
unremarkable.

Upper abdomen: Low-attenuation lesions in the liver measure up to
2.8 cm, stable. Visualized portions of the adrenal glands, spleen
and stomach are grossly unremarkable.

Musculoskeletal: No worrisome lytic or sclerotic lesions.
IMPRESSION: Lung-RADS Category 2, benign appearance or behavior. Continue annual
screening with low-dose chest CT without contrast in 12 months.

## 2015-09-23 ENCOUNTER — Other Ambulatory Visit: Payer: Self-pay | Admitting: Internal Medicine

## 2015-09-23 DIAGNOSIS — Z720 Tobacco use: Secondary | ICD-10-CM

## 2015-09-23 NOTE — Progress Notes (Signed)
Quick Note:  Called and spoke to pt's caregiver, Chad Moss. Informed her of the results and recs per [name] verbalized understanding and denied any further questions or concerns at this time.   ______

## 2015-11-09 ENCOUNTER — Other Ambulatory Visit: Payer: Self-pay | Admitting: Internal Medicine

## 2015-11-23 ENCOUNTER — Other Ambulatory Visit: Payer: Self-pay | Admitting: Internal Medicine

## 2015-12-12 ENCOUNTER — Other Ambulatory Visit: Payer: Self-pay | Admitting: Internal Medicine

## 2015-12-24 DIAGNOSIS — K50919 Crohn's disease, unspecified, with unspecified complications: Secondary | ICD-10-CM | POA: Diagnosis not present

## 2015-12-24 DIAGNOSIS — K589 Irritable bowel syndrome without diarrhea: Secondary | ICD-10-CM | POA: Diagnosis not present

## 2015-12-24 DIAGNOSIS — G479 Sleep disorder, unspecified: Secondary | ICD-10-CM | POA: Diagnosis not present

## 2016-01-14 DIAGNOSIS — J441 Chronic obstructive pulmonary disease with (acute) exacerbation: Secondary | ICD-10-CM | POA: Diagnosis not present

## 2016-01-15 ENCOUNTER — Other Ambulatory Visit: Payer: Self-pay | Admitting: Internal Medicine

## 2016-02-14 DIAGNOSIS — J441 Chronic obstructive pulmonary disease with (acute) exacerbation: Secondary | ICD-10-CM | POA: Diagnosis not present

## 2016-02-26 ENCOUNTER — Other Ambulatory Visit: Payer: Self-pay | Admitting: Internal Medicine

## 2016-03-03 DIAGNOSIS — H401133 Primary open-angle glaucoma, bilateral, severe stage: Secondary | ICD-10-CM | POA: Diagnosis not present

## 2016-03-03 DIAGNOSIS — H2513 Age-related nuclear cataract, bilateral: Secondary | ICD-10-CM | POA: Diagnosis not present

## 2016-03-06 ENCOUNTER — Ambulatory Visit (INDEPENDENT_AMBULATORY_CARE_PROVIDER_SITE_OTHER): Payer: Medicare Other | Admitting: Pulmonary Disease

## 2016-03-06 ENCOUNTER — Encounter: Payer: Self-pay | Admitting: Pulmonary Disease

## 2016-03-06 DIAGNOSIS — Z720 Tobacco use: Secondary | ICD-10-CM | POA: Diagnosis not present

## 2016-03-06 MED ORDER — PREDNISONE 10 MG PO TABS: ORAL_TABLET | ORAL | 0 refills | Status: DC

## 2016-03-06 NOTE — Patient Instructions (Signed)
Prednisone 10 mg tabs  Take 2 tabs daily with food x 5ds, then 1 tab daily with food x 5ds then STOP  Delsym cough syrup 5 ML twice daily for one week  If you have yellow/green phlegm, then call us for antibiotic

## 2016-03-06 NOTE — Assessment & Plan Note (Signed)
Consult about smoking cessation Reassured him that his lung nodule is benign

## 2016-03-06 NOTE — Assessment & Plan Note (Signed)
Prednisone 10 mg tabs  Take 2 tabs daily with food x 5ds, then 1 tab daily with food x 5ds then STOP  Delsym cough syrup 5 ML twice daily for one week  If you have yellow/green phlegm, then call us for antibiotic

## 2016-03-06 NOTE — Progress Notes (Signed)
   Subjective:    Patient ID: Chad Moss, male    DOB: 15-Aug-1955, 60 y.o.   MRN: [id number]  HPI  60 year old smoker with  emphysema  Chief Complaint  Patient presents with  . Acute Visit    Sob, when he lays down at night, cannot breath, dry, non-productive cough, throat itches, no chest tightness, pain in right side of the chest this morning.      He continues to smoke Accompanied by his girlfriend/caregiver-reports sudden onset worsening of his cough for the last 2-3 days, dry, complains of right-sided chest pain when he coughs which is episodic, and resolved spontaneously. He has not tried any over-the-counter medications Denies preceding URI symptoms or fever No pedal edema, orthopnea or paroxysmal nocturnal dyspnea, no chest pain on exertion  He is compliant with albuterol and this seems to relieve her symptoms  Significant tests/ events Spirometry 07/2015 Normal FEV1 but diffusion capacity is low at 56% and therefore he is isolated low diffusion capacity account of his emphysema   Screening CT chest/2017-  stable 4 mm right subpleural nodule   Review of Systems neg for any significant sore throat, dysphagia, itching, sneezing, nasal congestion or excess/ purulent secretions, fever, chills, sweats, unintended wt loss, pleuritic or exertional cp, hempoptysis, orthopnea pnd or change in chronic leg swelling. Also denies presyncope, palpitations, heartburn, abdominal pain, nausea, vomiting, diarrhea or change in bowel or urinary habits, dysuria,hematuria, rash, arthralgias, visual complaints, headache, numbness weakness or ataxia.     Objective:   Physical Exam   Gen. Pleasant, well-nourished, in no distress ENT - no lesions, no post nasal drip Neck: No JVD, no thyromegaly, no carotid bruits Lungs: no use of accessory muscles, no dullness to percussion, clear without rales or rhonchi  Cardiovascular: Rhythm regular, heart sounds  normal, no murmurs or gallops, no  peripheral edema Musculoskeletal: No deformities, no cyanosis or clubbing         Assessment & Plan:

## 2016-03-14 DIAGNOSIS — Z125 Encounter for screening for malignant neoplasm of prostate: Secondary | ICD-10-CM | POA: Diagnosis not present

## 2016-04-01 ENCOUNTER — Telehealth: Payer: Self-pay | Admitting: Pulmonary Disease

## 2016-04-01 ENCOUNTER — Emergency Department (HOSPITAL_COMMUNITY)
Admission: EM | Admit: 2016-04-01 | Discharge: 2016-04-01 | Disposition: A | Discharge status: Home / Self Care | Payer: Medicare Other | Attending: Emergency Medicine | Admitting: Emergency Medicine

## 2016-04-01 ENCOUNTER — Encounter (HOSPITAL_COMMUNITY): Payer: Self-pay | Admitting: *Deleted

## 2016-04-01 DIAGNOSIS — J209 Acute bronchitis, unspecified: Secondary | ICD-10-CM | POA: Diagnosis not present

## 2016-04-01 DIAGNOSIS — I10 Essential (primary) hypertension: Secondary | ICD-10-CM | POA: Diagnosis not present

## 2016-04-01 DIAGNOSIS — J40 Bronchitis, not specified as acute or chronic: Secondary | ICD-10-CM | POA: Insufficient documentation

## 2016-04-01 DIAGNOSIS — F17210 Nicotine dependence, cigarettes, uncomplicated: Secondary | ICD-10-CM | POA: Insufficient documentation

## 2016-04-01 DIAGNOSIS — Z79899 Other long term (current) drug therapy: Secondary | ICD-10-CM | POA: Diagnosis not present

## 2016-04-01 DIAGNOSIS — R05 Cough: Secondary | ICD-10-CM | POA: Diagnosis present

## 2016-04-01 DIAGNOSIS — J449 Chronic obstructive pulmonary disease, unspecified: Secondary | ICD-10-CM | POA: Diagnosis not present

## 2016-04-01 MED ORDER — PREDNISONE 20 MG PO TABS: ORAL_TABLET | ORAL | 0 refills | Status: DC

## 2016-04-01 MED ORDER — LEVOFLOXACIN 500 MG PO TABS
500.0000 mg | ORAL_TABLET | Freq: Once | ORAL | Status: AC
  Administered 2016-04-01: 500 mg via ORAL
  Filled 2016-04-01: qty 1

## 2016-04-01 MED ORDER — LEVOFLOXACIN 500 MG PO TABS: 500.0000 mg | ORAL_TABLET | Freq: Every day | ORAL | 0 refills | Status: DC

## 2016-04-01 MED ORDER — PREDNISONE 20 MG PO TABS
40.0000 mg | ORAL_TABLET | Freq: Once | ORAL | Status: AC
  Administered 2016-04-01: 40 mg via ORAL
  Filled 2016-04-01: qty 2

## 2016-04-01 NOTE — Telephone Encounter (Signed)
Patient with COPD whose caregiver notes increased dyspnea.  She feels he needs to be seen.  I recommended patient report to ED.  She will bring him to [facility] ED.

## 2016-04-01 NOTE — ED Provider Notes (Signed)
[facility]-EMERGENCY DEPT Provider Note   CSN: [id number] Arrival date & time: 04/01/16  8676   Chief complaint cough  History   Chief Complaint No chief complaint on file.   HPI Chad Moss is a 60 y.o. male.Complains of cough productive of yellowish sputum for the past one week accompanied by rhinorrhea and shortness of breath. He denies any fever. He treated himself with albuterol nebulizer prior to coming here this morning with relief of breathing. He is presently breathing normally. He states she's been treated in the past for similar illness with antibiotics and prednisone with relief. No other associated symptoms. He's been taking Delsym without relief of cough  HPI  Past Medical History:  Diagnosis Date  . Allergic rhinitis   . Atelectasis   . COPD (chronic obstructive pulmonary disease) (HCC)   . Crohn's disease (HCC)   . Dysphagia   . Dyspnea   . Glaucoma   . History of pneumonia   . Hyperlipidemia   . Hypertension   . IBS (irritable bowel syndrome)   . Paranoid schizophrenia (HCC)   . Peripheral vascular disease (HCC)   . Syncopal episodes     Patient Active Problem List   Diagnosis Date Noted  . Chronic obstructive pulmonary disease, unspecified copd, unspecified chronic bronchitis type 09/13/2015  . Cough 08/09/2015  . Flu-like symptoms 08/09/2015  . Acute sinusitis 08/09/2015  . AP (abdominal pain) 07/09/2014  . COLD (chronic obstructive lung disease) (HCC) 07/09/2014  . COPD exacerbation (HCC) 06/01/2014  . Other malaise and fatigue 09/06/2013  . Cancer screening 10/09/2012  . Chronic cough 11/19/2011  . Tobacco abuse 04/06/2011  . HYPERLIPIDEMIA 01/27/2009  . HYPERTENSION 01/27/2009  . PERIPHERAL VASCULAR DISEASE 01/27/2009  . ALLERGIC RHINITIS 01/27/2009  . COPD with emphysema (HCC) 01/27/2009  . DIZZINESS, CHRONIC 01/27/2009  . SHORTNESS OF BREATH (SOB) 01/27/2009  . CROHN'S DISEASE 01/26/2009    Past Surgical History:  Procedure Laterality  Date  . CHOLECYSTECTOMY    . COLON SURGERY  1996       Home Medications    Prior to Admission medications   Medication Sig Start Date End Date Taking? Authorizing Provider  albuterol (PROVENTIL) (2.5 MG/3ML) 0.083% nebulizer solution Take 3 mLs (2.5 mg total) by nebulization every 6 (six) hours as needed for wheezing or shortness of breath. 03/12/15   Kalman Shan, MD  alfuzosin (UROXATRAL) 10 MG 24 hr tablet Take 10 mg by mouth at bedtime.     Historical Provider, MD  b complex vitamins tablet Take 1 tablet by mouth daily.      Historical Provider, MD  bimatoprost (LUMIGAN) 0.01 % SOLN Place 1 drop into the left eye at bedtime.    Historical Provider, MD  brimonidine (ALPHAGAN) 0.15 % ophthalmic solution Place 1 drop into the left eye 2 (two) times daily.     Historical Provider, MD  cholestyramine Lanetta Inch) 4 G packet Take 1 packet by mouth 2 (two) times daily as needed.     Historical Provider, MD  clonazePAM (KLONOPIN) 1 MG tablet Take 1 mg by mouth 3 (three) times daily as needed. Anxiety    Historical Provider, MD  dicyclomine (BENTYL) 10 MG capsule Take 10 mg by mouth 4 (four) times daily -  before meals and at bedtime.     Historical Provider, MD  diphenhydrAMINE (BENADRYL) 25 MG tablet Take 25 mg by mouth every 6 (six) hours as needed.    Historical Provider, MD  dorzolamide-timolol (COSOPT) 22.3-6.8 MG/ML ophthalmic solution Place  1 drop into the left eye 2 (two) times daily.    Historical Provider, MD  esomeprazole (NEXIUM) 20 MG capsule Take 20 mg by mouth 2 (two) times daily.     Historical Provider, MD  levofloxacin (LEVAQUIN) 500 MG tablet Take 1 tablet (500 mg total) by mouth daily. 04/01/16   Doug Sou, MD  loxapine (LOXITANE) 10 MG capsule Take 10 mg by mouth 2 (two) times daily.     Historical Provider, MD  mercaptopurine (PURINETHOL) 50 MG tablet Take 75 mg by mouth daily. 1 tablet and a half tablet Give on an empty stomach 1 hour before or 2 hours after meals.  Caution: Chemotherapy.    Historical Provider, MD  mesalamine (PENTASA) 250 MG CR capsule Take 1,000 mg by mouth 3 (three) times daily.     Historical Provider, MD  mirabegron ER (MYRBETRIQ) 50 MG TB24 tablet Take 50 mg by mouth daily.    Historical Provider, MD  ondansetron (ZOFRAN) 8 MG tablet Take 8 mg by mouth 2 (two) times daily.    Historical Provider, MD  potassium chloride (KLOR-CON) 10 MEQ CR tablet Take 10 mEq by mouth 2 (two) times daily.     Historical Provider, MD  predniSONE (DELTASONE) 20 MG tablet 2 tabs po daily x 4 days starting 04/02/16 04/01/16   Doug Sou, MD  PROAIR HFA 108 (90 Base) MCG/ACT inhaler INHALE 2 PUFFS INTO THE LUNGS EVERY 6 HOURS AS NEEDED FOR WHEEZING OR SHORTNESS OF BREATH 11/24/15   Kalman Shan, MD  rosuvastatin (CRESTOR) 20 MG tablet Take 20 mg by mouth every morning.    Historical Provider, MD  SPIRIVA HANDIHALER 18 MCG inhalation capsule INHALE THE CONTENT OF 1 CAPSULE VIA HANDIHALER DAILY 11/09/15   Kalman Shan, MD  traZODone (DESYREL) 50 MG tablet Take 150 mg by mouth at bedtime.    Historical Provider, MD    Family History Family History  Problem Relation Age of Onset  . Hypertension Mother   . Heart disease Mother   . Hypertension Father   . Heart disease Father   . Lung cancer Brother     2006    Social History Social History  Substance Use Topics  . Smoking status: Current Every Day Smoker    Packs/day: 1.00    Years: 27.00    Types: Cigarettes  . Smokeless tobacco: Never Used  . Alcohol use No     Allergies   Benztropine; Codeine; Meperidine; Cyclobenzaprine; Penicillins; Sulfamethoxazole-trimethoprim; and Sulfonamide derivatives   Review of Systems Review of Systems  Constitutional: Negative.   HENT: Positive for rhinorrhea.   Respiratory: Positive for cough and shortness of breath.   Cardiovascular: Positive for chest pain.       Syncope  Gastrointestinal: Negative.   Musculoskeletal: Negative.   Skin:  Negative.   Allergic/Immunologic: Negative.   Neurological: Negative.   Psychiatric/Behavioral: Negative.   All other systems reviewed and are negative.    Physical Exam Updated Vital Signs BP 104/80 (BP Location: Left Arm)   Pulse 106   Temp 98.5 F (36.9 C) (Oral)   Resp 16   Ht 5\' 11"  (1.803 m)   Wt 185 lb (83.9 kg)   SpO2 98%   BMI 25.80 kg/m   Physical Exam  Constitutional: He appears well-developed and well-nourished. No distress.  HENT:  Head: Normocephalic and atraumatic.  Eyes: Conjunctivae are normal. Pupils are equal, round, and reactive to light.  Neck: Neck supple. No tracheal deviation present. No thyromegaly present.  Cardiovascular:  Normal rate, regular rhythm and normal heart sounds.   No murmur heard. Heart rate counted 92 bpm by me  Pulmonary/Chest: Effort normal. No respiratory distress.  Diffuse scant rhonchi. Speaks in paragraphs.  Abdominal: Soft. Bowel sounds are normal. He exhibits no distension. There is no tenderness.  Musculoskeletal: Normal range of motion. He exhibits no edema or tenderness.  Neurological: He is alert. Coordination normal.  Skin: Skin is warm and dry. No rash noted.  Psychiatric: He has a normal mood and affect.  Nursing note and vitals reviewed.    ED Treatments / Results  Labs (all labs ordered are listed, but only abnormal results are displayed) Labs Reviewed - No data to display  EKG  EKG Interpretation None       Radiology No results found.  Procedures Procedures (including critical care time)  Medications Ordered in ED Medications  predniSONE (DELTASONE) tablet 40 mg (not administered)  levofloxacin (LEVAQUIN) tablet 500 mg (not administered)     Initial Impression / Assessment and Plan / ED Course  I have reviewed the triage vital signs and the nursing notes.  Pertinent labs & imaging results that were available during my care of the patient were reviewed by me and considered in my medical  decision making (see chart for details).  Clinical Course    Counseled patient for 5 minutes on smoking cessation. He sees his albuterol HFA or nebulizer every 4 hours as needed for cough or shortness of breath. Return if needed more than every 4 hours . Prescriptions prednisone and Levaquin  Final Clinical Impressions(s) / ED Diagnoses  Diagnoses #1 acute bronchitis #2 tobacco abuse Final diagnoses:  Bronchitis    New Prescriptions New Prescriptions   LEVOFLOXACIN (LEVAQUIN) 500 MG TABLET    Take 1 tablet (500 mg total) by mouth daily.   PREDNISONE (DELTASONE) 20 MG TABLET    2 tabs po daily x 4 days starting 04/02/16     Doug SouSam Heba Ige, MD 04/01/16 [phone]

## 2016-04-01 NOTE — ED Notes (Signed)
During screening questions, pt stated he does not feel safe at home in his living environment. Pt calls the  Name "Sullivan Loneodd" Lady at bedside, identifies herself as his live in caregiver. She states pt has a mental illness, schizophrenia.  Pt states "Tawanna Coolerodd" tells him to go to the school and kill little children.

## 2016-04-01 NOTE — Discharge Instructions (Signed)
Use your albuterol inhaler 2 puffs every 4 hours or your albuterol nebulizer every 4 hours as needed for cough or shortness of breath. Return if needed more than every 4 hours, or see your doctor. Ask Dr. Allyne Gee to help you to stop smoking. You can start taking the prednisone and levofloxacin(levaquin) prescribed tomorrow.

## 2016-04-01 NOTE — ED Triage Notes (Signed)
Pt states he has had a cold  Sx's: productive cough, runny nose for over 1 week. He has h/o COPD. Current smoker.  Has taken Delsym x 1 week with out relief. In September, his physician gave him prednisone and ATB. Has burning in his chest when cough.

## 2016-04-03 NOTE — Telephone Encounter (Signed)
Chart reviewed. He appears to be in ed  Dr. Brand Males, M.D., [facility].C.P Pulmonary and Critical Care Medicine Staff Physician [place] [facility] Pager: [phone], If no answer or between  15:00h - 7:00h: call [phone]  04/03/2016 1:14 PM

## 2016-04-19 DIAGNOSIS — I639 Cerebral infarction, unspecified: Secondary | ICD-10-CM

## 2016-04-19 HISTORY — DX: Cerebral infarction, unspecified: I63.9

## 2016-05-17 ENCOUNTER — Encounter (HOSPITAL_COMMUNITY): Payer: Self-pay | Admitting: Emergency Medicine

## 2016-05-17 ENCOUNTER — Emergency Department (HOSPITAL_COMMUNITY)
Admission: EM | Admit: 2016-05-17 | Discharge: 2016-05-17 | Disposition: A | Discharge status: Home / Self Care | Payer: Medicare Other | Attending: Emergency Medicine | Admitting: Emergency Medicine

## 2016-05-17 DIAGNOSIS — I4589 Other specified conduction disorders: Secondary | ICD-10-CM | POA: Diagnosis not present

## 2016-05-17 DIAGNOSIS — I739 Peripheral vascular disease, unspecified: Secondary | ICD-10-CM | POA: Diagnosis not present

## 2016-05-17 DIAGNOSIS — H53132 Sudden visual loss, left eye: Secondary | ICD-10-CM | POA: Diagnosis present

## 2016-05-17 DIAGNOSIS — H3410 Central retinal artery occlusion, unspecified eye: Secondary | ICD-10-CM | POA: Diagnosis not present

## 2016-05-17 DIAGNOSIS — H3412 Central retinal artery occlusion, left eye: Secondary | ICD-10-CM | POA: Diagnosis not present

## 2016-05-17 DIAGNOSIS — J449 Chronic obstructive pulmonary disease, unspecified: Secondary | ICD-10-CM | POA: Diagnosis not present

## 2016-05-17 DIAGNOSIS — H409 Unspecified glaucoma: Secondary | ICD-10-CM | POA: Diagnosis not present

## 2016-05-17 DIAGNOSIS — F17210 Nicotine dependence, cigarettes, uncomplicated: Secondary | ICD-10-CM | POA: Diagnosis not present

## 2016-05-17 DIAGNOSIS — H34239 Retinal artery branch occlusion, unspecified eye: Secondary | ICD-10-CM | POA: Diagnosis not present

## 2016-05-17 DIAGNOSIS — H5462 Unqualified visual loss, left eye, normal vision right eye: Secondary | ICD-10-CM | POA: Diagnosis not present

## 2016-05-17 DIAGNOSIS — I10 Essential (primary) hypertension: Secondary | ICD-10-CM | POA: Insufficient documentation

## 2016-05-17 DIAGNOSIS — E041 Nontoxic single thyroid nodule: Secondary | ICD-10-CM | POA: Diagnosis not present

## 2016-05-17 NOTE — ED Provider Notes (Signed)
[facility] DEPT Provider Note   CSN: [id number] Arrival date & time: 05/17/16  1047     History   Chief Complaint Chief Complaint  Patient presents with  . Eye Injury    HPI Chad Moss is a 60 y.o. male.  HPI Patient was bending over to tie his shoe at [place] today when he felt a "pop" in his left eye and lost vision. States it went very white and black all over. No pain. No headache. No vision change in the other eye. He states now the vision is back to normal since he's come to the ER. He does state however he has some loss of vision on the lateral visual field of his left eye. History of glaucoma and has a ophthalmologist in [place].   Past Medical History:  Diagnosis Date  . Allergic rhinitis   . Atelectasis   . COPD (chronic obstructive pulmonary disease) ([place])   . Crohn's disease ([place])   . Dysphagia   . Dyspnea   . Glaucoma   . History of pneumonia   . Hyperlipidemia   . Hypertension   . IBS (irritable bowel syndrome)   . Paranoid schizophrenia ([place])   . Peripheral vascular disease ([place])   . Syncopal episodes     Patient Active Problem List   Diagnosis Date Noted  . Chronic obstructive pulmonary disease, unspecified copd, unspecified chronic bronchitis type 09/13/2015  . Cough 08/09/2015  . Flu-like symptoms 08/09/2015  . Acute sinusitis 08/09/2015  . AP (abdominal pain) 07/09/2014  . COLD (chronic obstructive lung disease) ([place]) 07/09/2014  . COPD exacerbation ([place]) 06/01/2014  . Other malaise and fatigue 09/06/2013  . Cancer screening 10/09/2012  . Chronic cough 11/19/2011  . Tobacco abuse 04/06/2011  . HYPERLIPIDEMIA 01/27/2009  . HYPERTENSION 01/27/2009  . PERIPHERAL VASCULAR DISEASE 01/27/2009  . ALLERGIC RHINITIS 01/27/2009  . COPD with emphysema ([place]) 01/27/2009  . DIZZINESS, CHRONIC 01/27/2009  . SHORTNESS OF BREATH (SOB) 01/27/2009  . CROHN'S DISEASE 01/26/2009    Past Surgical History:  Procedure Laterality Date  .  CHOLECYSTECTOMY    . COLON SURGERY  1996       Home Medications    Prior to Admission medications   Medication Sig Start Date End Date Taking? Authorizing Provider  albuterol (PROVENTIL) (2.5 MG/3ML) 0.083% nebulizer solution Take 3 mLs (2.5 mg total) by nebulization every 6 (six) hours as needed for wheezing or shortness of breath. 03/12/15  Yes Brand Males, MD  alfuzosin (UROXATRAL) 10 MG 24 hr tablet Take 10 mg by mouth at bedtime.    Yes Historical Provider, MD  b complex vitamins tablet Take 1 tablet by mouth daily.     Yes Historical Provider, MD  brimonidine (ALPHAGAN) 0.15 % ophthalmic solution Place 1 drop into the left eye 2 (two) times daily.    Yes Historical Provider, MD  cholestyramine Lucrezia Starch) 4 G packet Take 1 packet by mouth 2 (two) times daily as needed (Crohn's-related issues.).    Yes Historical Provider, MD  bimatoprost (LUMIGAN) 0.01 % SOLN Place 1 drop into the left eye at bedtime.    Historical Provider, MD  clonazePAM (KLONOPIN) 1 MG tablet Take 1 mg by mouth 3 (three) times daily as needed. Anxiety    Historical Provider, MD  dicyclomine (BENTYL) 10 MG capsule Take 10 mg by mouth 4 (four) times daily -  before meals and at bedtime.     Historical Provider, MD  diphenhydrAMINE (BENADRYL) 25 MG tablet Take 25 mg by  mouth every 6 (six) hours as needed.    Historical Provider, MD  dorzolamide-timolol (COSOPT) 22.3-6.8 MG/ML ophthalmic solution Place 1 drop into the left eye 2 (two) times daily.    Historical Provider, MD  esomeprazole (NEXIUM) 20 MG capsule Take 20 mg by mouth 2 (two) times daily.     Historical Provider, MD  levofloxacin (LEVAQUIN) 500 MG tablet Take 1 tablet (500 mg total) by mouth daily. 04/01/16   Orlie Dakin, MD  loxapine (LOXITANE) 10 MG capsule Take 10 mg by mouth 2 (two) times daily.     Historical Provider, MD  mercaptopurine (PURINETHOL) 50 MG tablet Take 75 mg by mouth daily. 1 tablet and a half tablet Give on an empty stomach 1 hour  before or 2 hours after meals. Caution: Chemotherapy.    Historical Provider, MD  mesalamine (PENTASA) 250 MG CR capsule Take 1,000 mg by mouth 3 (three) times daily.     Historical Provider, MD  mirabegron ER (MYRBETRIQ) 50 MG TB24 tablet Take 50 mg by mouth daily.    Historical Provider, MD  ondansetron (ZOFRAN) 8 MG tablet Take 8 mg by mouth 2 (two) times daily.    Historical Provider, MD  potassium chloride (KLOR-CON) 10 MEQ CR tablet Take 10 mEq by mouth 2 (two) times daily.     Historical Provider, MD  predniSONE (DELTASONE) 20 MG tablet 2 tabs po daily x 4 days starting 04/02/16 04/01/16   Orlie Dakin, MD  PROAIR HFA 108 (90 Base) MCG/ACT inhaler INHALE 2 PUFFS INTO THE LUNGS EVERY 6 HOURS AS NEEDED FOR WHEEZING OR SHORTNESS OF BREATH 11/24/15   Brand Males, MD  rosuvastatin (CRESTOR) 20 MG tablet Take 20 mg by mouth every morning.    Historical Provider, MD  SPIRIVA HANDIHALER 18 MCG inhalation capsule INHALE THE CONTENT OF 1 CAPSULE VIA HANDIHALER DAILY 11/09/15   Brand Males, MD  traZODone (DESYREL) 50 MG tablet Take 150 mg by mouth at bedtime.    Historical Provider, MD    Family History Family History  Problem Relation Age of Onset  . Hypertension Mother   . Heart disease Mother   . Hypertension Father   . Heart disease Father   . Lung cancer Brother     2006    Social History Social History  Substance Use Topics  . Smoking status: Current Every Day Smoker    Packs/day: 1.00    Years: 27.00    Types: Cigarettes  . Smokeless tobacco: Never Used  . Alcohol use No     Allergies   Benztropine; Codeine; Meperidine; Cyclobenzaprine; Penicillins; Sulfamethoxazole-trimethoprim; and Sulfonamide derivatives   Review of Systems Review of Systems  Constitutional: Negative for appetite change.  HENT: Negative for facial swelling.   Eyes: Positive for visual disturbance. Negative for photophobia, pain, discharge, redness and itching.  Respiratory: Negative for  chest tightness.   Cardiovascular: Negative for chest pain.  Gastrointestinal: Negative for abdominal pain.  Musculoskeletal: Negative for back pain.  Neurological: Negative for headaches.     Physical Exam Updated Vital Signs BP 162/87   Pulse 108   Temp (P) 98.4 F (36.9 C) (Oral)   Resp 18   SpO2 97%   Physical Exam  Constitutional: He appears well-developed.  HENT:  Head: Atraumatic.  Eyes: EOM are normal. Pupils are equal, round, and reactive to light. Right eye exhibits no discharge. Left eye exhibits no discharge.  Visual fields grossly intact although may have slight decrease in nasal visual field of left eye  but patient states he is having difficulty seeing on the temporal side of the left eye.  Neck: Neck supple.  Cardiovascular: Normal rate.   Neurological: He is alert.  Skin: Skin is warm.     ED Treatments / Results  Labs (all labs ordered are listed, but only abnormal results are displayed) Labs Reviewed - No data to display  EKG  EKG Interpretation None       Radiology No results found.  Procedures Procedures (including critical care time)  Medications Ordered in ED Medications - No data to display   Initial Impression / Assessment and Plan / ED Course  I have reviewed the triage vital signs and the nursing notes.  Pertinent labs & imaging results that were available during my care of the patient were reviewed by me and considered in my medical decision making (see chart for details).  Clinical Course     Patient with painless vision loss left eye. Vision has improved except for maybe some lateral peripheral vision. Attempt to get a hold of ophthalmology here but did not hear back. Patient and his family member consult at his ophthalmologist over the phone in [place] and he will be seen as soon as he can get over-the-counter. Discharged from ER.  Final Clinical Impressions(s) / ED Diagnoses   Final diagnoses:  Vision loss of left eye     New Prescriptions Discharge Medication List as of 05/17/2016  2:52 PM       Davonna Belling, MD 05/17/16 2333

## 2016-05-17 NOTE — Discharge Instructions (Signed)
Go to see your eye doctor now as planned.

## 2016-05-17 NOTE — ED Notes (Signed)
Felt a pop in his eye then a flash of light then his vision is compltely gone

## 2016-05-17 NOTE — ED Triage Notes (Signed)
Pt reports eye injury , sts was banding down to tie his shoes when he heard "a pop". Hx glaucoma, sts unable to see from side left eye. Hx HTN. No neuro deficit noted. Alert and oriented x 4. denies dizziness nor headache.

## 2016-06-02 DIAGNOSIS — H3412 Central retinal artery occlusion, left eye: Secondary | ICD-10-CM | POA: Diagnosis not present

## 2016-06-05 DIAGNOSIS — E782 Mixed hyperlipidemia: Secondary | ICD-10-CM | POA: Diagnosis not present

## 2016-06-05 DIAGNOSIS — I69398 Other sequelae of cerebral infarction: Secondary | ICD-10-CM | POA: Diagnosis not present

## 2016-06-05 DIAGNOSIS — H539 Unspecified visual disturbance: Secondary | ICD-10-CM | POA: Diagnosis not present

## 2016-06-20 ENCOUNTER — Telehealth: Payer: Self-pay | Admitting: Internal Medicine

## 2016-06-20 NOTE — Telephone Encounter (Signed)
CY  Please Advise-Sick Message   MR Pt. Pt. C/o coughing with yellow phlegm,runny nose, and congestion, Denies fever,wheezing,sob,chest tightness. He wanted to know if prednisone and an antibiotic can be called in.  Allergies  Allergen Reactions  . Benztropine Anaphylaxis  . Codeine Anaphylaxis  . Meperidine Swelling  . Cyclobenzaprine Other (See Comments)  . Penicillins     Has patient had a PCN reaction causing immediate rash, facial/tongue/throat swelling, SOB or lightheadedness with hypotension: Yes Has patient had a PCN reaction causing severe rash involving mucus membranes or skin necrosis: No Has patient had a PCN reaction that required hospitalization No Has patient had a PCN reaction occurring within the last 10 years: No If all of the above answers are "NO", then may proceed with Cephalosporin use.   . Sulfamethoxazole-Trimethoprim     REACTION: hives  . Sulfonamide Derivatives     REACTION: hives

## 2016-06-20 NOTE — Telephone Encounter (Signed)
Offer prednisone 10 mg, # 20   4 X 2 DAYS, 3 X 2 DAYS, 2 X 2 DAYS, 1 X 2 DAYS             Z pak, 250 mg, # 6    2 today then one daily

## 2016-06-20 NOTE — Telephone Encounter (Signed)
lmomtcb x1 

## 2016-06-21 MED ORDER — PREDNISONE 10 MG PO TABS: ORAL_TABLET | ORAL | 0 refills | Status: DC

## 2016-06-21 MED ORDER — AZITHROMYCIN 250 MG PO TABS: ORAL_TABLET | ORAL | 0 refills | Status: DC

## 2016-06-21 NOTE — Telephone Encounter (Signed)
Pt aware of recs.  rx sent to preferred pharmacy.  Nothing further needed.  

## 2016-06-28 ENCOUNTER — Emergency Department (HOSPITAL_BASED_OUTPATIENT_CLINIC_OR_DEPARTMENT_OTHER): Admit: 2016-06-28 | Discharge: 2016-06-28 | Disposition: A | Discharge status: Home / Self Care | Payer: Medicare Other

## 2016-06-28 ENCOUNTER — Encounter (HOSPITAL_COMMUNITY): Payer: Self-pay | Admitting: Emergency Medicine

## 2016-06-28 ENCOUNTER — Emergency Department (HOSPITAL_COMMUNITY)
Admission: EM | Admit: 2016-06-28 | Discharge: 2016-06-28 | Disposition: A | Discharge status: Home / Self Care | Payer: Medicare Other | Attending: Emergency Medicine | Admitting: Emergency Medicine

## 2016-06-28 DIAGNOSIS — F17210 Nicotine dependence, cigarettes, uncomplicated: Secondary | ICD-10-CM | POA: Diagnosis not present

## 2016-06-28 DIAGNOSIS — I10 Essential (primary) hypertension: Secondary | ICD-10-CM | POA: Insufficient documentation

## 2016-06-28 DIAGNOSIS — J449 Chronic obstructive pulmonary disease, unspecified: Secondary | ICD-10-CM | POA: Insufficient documentation

## 2016-06-28 DIAGNOSIS — Z79899 Other long term (current) drug therapy: Secondary | ICD-10-CM | POA: Diagnosis not present

## 2016-06-28 DIAGNOSIS — M79652 Pain in left thigh: Secondary | ICD-10-CM | POA: Diagnosis not present

## 2016-06-28 DIAGNOSIS — M79609 Pain in unspecified limb: Secondary | ICD-10-CM | POA: Diagnosis not present

## 2016-06-28 DIAGNOSIS — M79605 Pain in left leg: Secondary | ICD-10-CM | POA: Diagnosis not present

## 2016-06-28 MED ORDER — OXYCODONE-ACETAMINOPHEN 5-325 MG PO TABS
1.0000 | ORAL_TABLET | Freq: Once | ORAL | Status: AC
  Administered 2016-06-28: 1 via ORAL
  Filled 2016-06-28: qty 1

## 2016-06-28 MED ORDER — OXYCODONE-ACETAMINOPHEN 5-325 MG PO TABS: 2.0000 | ORAL_TABLET | ORAL | 0 refills | Status: DC | PRN

## 2016-06-28 NOTE — ED Provider Notes (Signed)
[facility] DEPT Provider Note   CSN: [id number] Arrival date & time: 06/28/16  1502     History   Chief Complaint Chief Complaint  Patient presents with  . Leg Pain    HPI Chad Moss is a 61 y.o. male.  61 year old male who presents complaining of three-day history of left thigh pain characterized as sharp with movement and certain positions. Does have a history of DVT and says that this might feel similar. Denies any chest pain or shortness of breath. Denies any recent history of trauma. No numbness or tingling to his left foot. Is not currently taking any anticoagulants. Symptoms better with remaining still.      Past Medical History:  Diagnosis Date  . Allergic rhinitis   . Atelectasis   . COPD (chronic obstructive pulmonary disease) ([place])   . Crohn's disease ([place])   . Dysphagia   . Dyspnea   . Glaucoma   . History of pneumonia   . Hyperlipidemia   . Hypertension   . IBS (irritable bowel syndrome)   . Paranoid schizophrenia ([place])   . Peripheral vascular disease ([place])   . Syncopal episodes     Patient Active Problem List   Diagnosis Date Noted  . Chronic obstructive pulmonary disease, unspecified copd, unspecified chronic bronchitis type 09/13/2015  . Cough 08/09/2015  . Flu-like symptoms 08/09/2015  . Acute sinusitis 08/09/2015  . AP (abdominal pain) 07/09/2014  . COLD (chronic obstructive lung disease) ([place]) 07/09/2014  . COPD exacerbation ([place]) 06/01/2014  . Other malaise and fatigue 09/06/2013  . Cancer screening 10/09/2012  . Chronic cough 11/19/2011  . Tobacco abuse 04/06/2011  . HYPERLIPIDEMIA 01/27/2009  . HYPERTENSION 01/27/2009  . PERIPHERAL VASCULAR DISEASE 01/27/2009  . ALLERGIC RHINITIS 01/27/2009  . COPD with emphysema ([place]) 01/27/2009  . DIZZINESS, CHRONIC 01/27/2009  . SHORTNESS OF BREATH (SOB) 01/27/2009  . CROHN'S DISEASE 01/26/2009    Past Surgical History:  Procedure Laterality Date  . CHOLECYSTECTOMY    . COLON  SURGERY  1996       Home Medications    Prior to Admission medications   Medication Sig Start Date End Date Taking? Authorizing Provider  albuterol (PROVENTIL) (2.5 MG/3ML) 0.083% nebulizer solution Take 3 mLs (2.5 mg total) by nebulization every 6 (six) hours as needed for wheezing or shortness of breath. 03/12/15   Brand Males, MD  alfuzosin (UROXATRAL) 10 MG 24 hr tablet Take 10 mg by mouth at bedtime.     Historical Provider, MD  azithromycin (ZITHROMAX) 250 MG tablet Take 2 tabs today, then 1 tab daily until gone. 06/21/16   Deneise Lever, MD  b complex vitamins tablet Take 1 tablet by mouth daily.      Historical Provider, MD  bimatoprost (LUMIGAN) 0.01 % SOLN Place 1 drop into the left eye at bedtime.    Historical Provider, MD  brimonidine (ALPHAGAN) 0.15 % ophthalmic solution Place 1 drop into the left eye 2 (two) times daily.     Historical Provider, MD  cholestyramine Lucrezia Starch) 4 G packet Take 1 packet by mouth 2 (two) times daily as needed (Crohn's-related issues.).     Historical Provider, MD  clonazePAM (KLONOPIN) 1 MG tablet Take 1 mg by mouth 3 (three) times daily as needed. Anxiety    Historical Provider, MD  dicyclomine (BENTYL) 10 MG capsule Take 10 mg by mouth 4 (four) times daily -  before meals and at bedtime.     Historical Provider, MD  diphenhydrAMINE (BENADRYL) 25 MG  tablet Take 25 mg by mouth every 6 (six) hours as needed.    Historical Provider, MD  dorzolamide-timolol (COSOPT) 22.3-6.8 MG/ML ophthalmic solution Place 1 drop into the left eye 2 (two) times daily.    Historical Provider, MD  esomeprazole (NEXIUM) 20 MG capsule Take 20 mg by mouth 2 (two) times daily.     Historical Provider, MD  levofloxacin (LEVAQUIN) 500 MG tablet Take 1 tablet (500 mg total) by mouth daily. 04/01/16   Orlie Dakin, MD  loxapine (LOXITANE) 10 MG capsule Take 10 mg by mouth 2 (two) times daily.     Historical Provider, MD  mercaptopurine (PURINETHOL) 50 MG tablet Take 75 mg  by mouth daily. 1 tablet and a half tablet Give on an empty stomach 1 hour before or 2 hours after meals. Caution: Chemotherapy.    Historical Provider, MD  mesalamine (PENTASA) 250 MG CR capsule Take 1,000 mg by mouth 3 (three) times daily.     Historical Provider, MD  mirabegron ER (MYRBETRIQ) 50 MG TB24 tablet Take 50 mg by mouth daily.    Historical Provider, MD  ondansetron (ZOFRAN) 8 MG tablet Take 8 mg by mouth 2 (two) times daily.    Historical Provider, MD  potassium chloride (KLOR-CON) 10 MEQ CR tablet Take 10 mEq by mouth 2 (two) times daily.     Historical Provider, MD  predniSONE (DELTASONE) 10 MG tablet 5mX2 days, 380mX2 days, 2027m2 days, 69m47mdays, then stop. 06/21/16   ClinDeneise Lever  PROAIR HFA 108 ([phone]) MCG/ACT inhaler INHALE 2 PUFFS INTO THE LUNGS EVERY 6 HOURS AS NEEDED FOR WHEEZING OR SHORTNESS OF BREATH 11/24/15   MuraBrand Males  rosuvastatin (CRESTOR) 20 MG tablet Take 20 mg by mouth every morning.    Historical Provider, MD  SPIRIVA HANDIHALER 18 MCG inhalation capsule INHALE THE CONTENT OF 1 CAPSULE VIA HANDIHALER DAILY 11/09/15   MuraBrand Males  traZODone (DESYREL) 50 MG tablet Take 150 mg by mouth at bedtime.    Historical Provider, MD    Family History Family History  Problem Relation Age of Onset  . Hypertension Mother   . Heart disease Mother   . Hypertension Father   . Heart disease Father   . Lung cancer Brother     2006    Social History Social History  Substance Use Topics  . Smoking status: Current Every Day Smoker    Packs/day: 1.00    Years: 27.00    Types: Cigarettes  . Smokeless tobacco: Never Used  . Alcohol use No     Allergies   Benztropine; Codeine; Meperidine; Cyclobenzaprine; Penicillins; Sulfamethoxazole-trimethoprim; and Sulfonamide derivatives   Review of Systems Review of Systems  All other systems reviewed and are negative.    Physical Exam Updated Vital Signs BP 130/90 (BP Location: Left Arm)    Pulse 92   Temp 98.3 F (36.8 C) (Oral)   Resp 18   Ht 5' 11"  (1.803 m)   Wt 87.1 kg   SpO2 98%   BMI 26.78 kg/m   Physical Exam  Constitutional: He is oriented to person, place, and time. He appears well-developed and well-nourished.  Non-toxic appearance. No distress.  HENT:  Head: Normocephalic and atraumatic.  Eyes: Conjunctivae, EOM and lids are normal. Pupils are equal, round, and reactive to light.  Neck: Normal range of motion. Neck supple. No tracheal deviation present. No thyroid mass present.  Cardiovascular: Normal rate, regular rhythm and normal heart sounds.  Exam  reveals no gallop.   No murmur heard. Pulmonary/Chest: Effort normal and breath sounds normal. No stridor. No respiratory distress. He has no decreased breath sounds. He has no wheezes. He has no rhonchi. He has no rales.  Abdominal: Soft. Normal appearance and bowel sounds are normal. He exhibits no distension. There is no tenderness. There is no rebound and no CVA tenderness.  Musculoskeletal: Normal range of motion. He exhibits no edema or tenderness.       Legs: Neurological: He is alert and oriented to person, place, and time. He has normal strength. No cranial nerve deficit or sensory deficit. GCS eye subscore is 4. GCS verbal subscore is 5. GCS motor subscore is 6.  Skin: Skin is warm and dry. No abrasion and no rash noted.  Psychiatric: He has a normal mood and affect. His speech is normal and behavior is normal.  Nursing note and vitals reviewed.    ED Treatments / Results  Labs (all labs ordered are listed, but only abnormal results are displayed) Labs Reviewed - No data to display  EKG  EKG Interpretation None       Radiology No results found.  Procedures Procedures (including critical care time)  Medications Ordered in ED Medications - No data to display   Initial Impression / Assessment and Plan / ED Course  I have reviewed the triage vital signs and the nursing  notes.  Pertinent labs & imaging results that were available during my care of the patient were reviewed by me and considered in my medical decision making (see chart for details).  Clinical Course     Doppler lower extremity negative. Suspect musculoskeletal strain. Stable for discharge  Final Clinical Impressions(s) / ED Diagnoses   Final diagnoses:  None    New Prescriptions New Prescriptions   No medications on file     Lacretia Leigh, MD 06/28/16 2112

## 2016-06-28 NOTE — ED Triage Notes (Addendum)
Patient reports left thigh pain x3 days. States pain worsens with movement. Hx DVT. Denies injury.Cap refill <3 seconds. +2 pulse in right foot.

## 2016-06-28 NOTE — Progress Notes (Signed)
VASCULAR LAB PRELIMINARY  PRELIMINARY  PRELIMINARY  PRELIMINARY  Left lower extremity venous duplex completed.    Preliminary report:  Left:  No evidence of DVT, superficial thrombosis, or Baker's cyst.  Genetta Fiero, RVS 06/28/2016, 7:47 PM

## 2016-06-30 DIAGNOSIS — H3412 Central retinal artery occlusion, left eye: Secondary | ICD-10-CM | POA: Diagnosis not present

## 2016-06-30 DIAGNOSIS — I517 Cardiomegaly: Secondary | ICD-10-CM | POA: Diagnosis not present

## 2016-07-10 ENCOUNTER — Other Ambulatory Visit: Payer: Self-pay | Admitting: Internal Medicine

## 2016-07-19 DIAGNOSIS — Z87891 Personal history of nicotine dependence: Secondary | ICD-10-CM | POA: Diagnosis not present

## 2016-07-19 DIAGNOSIS — I739 Peripheral vascular disease, unspecified: Secondary | ICD-10-CM | POA: Diagnosis not present

## 2016-07-19 DIAGNOSIS — M79662 Pain in left lower leg: Secondary | ICD-10-CM | POA: Diagnosis not present

## 2016-07-21 DIAGNOSIS — H401133 Primary open-angle glaucoma, bilateral, severe stage: Secondary | ICD-10-CM | POA: Diagnosis not present

## 2016-07-21 DIAGNOSIS — H2513 Age-related nuclear cataract, bilateral: Secondary | ICD-10-CM | POA: Diagnosis not present

## 2016-07-21 DIAGNOSIS — H3412 Central retinal artery occlusion, left eye: Secondary | ICD-10-CM | POA: Diagnosis not present

## 2016-08-07 ENCOUNTER — Other Ambulatory Visit: Payer: Self-pay | Admitting: Internal Medicine

## 2016-08-10 ENCOUNTER — Telehealth: Payer: Self-pay | Admitting: Internal Medicine

## 2016-08-10 MED ORDER — DOXYCYCLINE HYCLATE 100 MG PO TABS: 100.0000 mg | ORAL_TABLET | Freq: Two times a day (BID) | ORAL | 0 refills | Status: DC

## 2016-08-10 MED ORDER — PREDNISONE 5 MG PO TABS: ORAL_TABLET | ORAL | 0 refills | Status: DC

## 2016-08-10 NOTE — Telephone Encounter (Signed)
Called and spoke with pts caregiver and she is aware of MR recs and that these meds have been sent to the pharmacy.

## 2016-08-10 NOTE — Telephone Encounter (Signed)
For aecopd/sinusitis  Take doxycycline 149m po twice daily x 5 days; take after meals and avoid sunlight  Please take prednisone 40 mg x1 day, then 30 mg x1 day, then 20 mg x1 day, then 10 mg x1 day, and then 5 mg x1 day and stop   Allergies  Allergen Reactions  . Benztropine Anaphylaxis  . Codeine Anaphylaxis  . Meperidine Swelling  . Cyclobenzaprine Other (See Comments)  . Penicillins     Has patient had a PCN reaction causing immediate rash, facial/tongue/throat swelling, SOB or lightheadedness with hypotension: Yes Has patient had a PCN reaction causing severe rash involving mucus membranes or skin necrosis: No Has patient had a PCN reaction that required hospitalization No Has patient had a PCN reaction occurring within the last 10 years: No If all of the above answers are "NO", then may proceed with Cephalosporin use.   . Sulfamethoxazole-Trimethoprim     REACTION: hives  . Sulfonamide Derivatives     REACTION: hives

## 2016-08-10 NOTE — Telephone Encounter (Signed)
MR  Please Advise- Sick Message  For the past 4 days pt has been coughing a lot, coughing up yellow to green phlegm, and lots of nasal drainage. Denies fever, or any changes in his breathing. He wanted to know if something could be called in.

## 2016-09-11 DIAGNOSIS — N3281 Overactive bladder: Secondary | ICD-10-CM | POA: Diagnosis not present

## 2016-09-23 ENCOUNTER — Other Ambulatory Visit: Payer: Self-pay | Admitting: Internal Medicine

## 2016-10-02 ENCOUNTER — Ambulatory Visit: Payer: Medicare Other

## 2016-10-02 ENCOUNTER — Ambulatory Visit (INDEPENDENT_AMBULATORY_CARE_PROVIDER_SITE_OTHER)
Admission: RE | Admit: 2016-10-02 | Discharge: 2016-10-02 | Disposition: A | Discharge status: Home / Self Care | Payer: Medicare Other | Source: Ambulatory Visit | Attending: Internal Medicine | Admitting: Internal Medicine

## 2016-10-02 DIAGNOSIS — Z720 Tobacco use: Secondary | ICD-10-CM

## 2016-10-02 DIAGNOSIS — Z87891 Personal history of nicotine dependence: Secondary | ICD-10-CM

## 2016-10-02 IMAGING — CT CT CHEST LUNG CANCER SCREENING LOW DOSE W/O CM
2 of 4 series · 15 of 40 positions shown, 18 images · non-contrast
Comparison: [DATE].

CLINICAL DATA: Current smoker, 46 pack-year history, lung cancer
screening.

EXAM:
CT CHEST WITHOUT CONTRAST LOW-DOSE FOR LUNG CANCER SCREENING
TECHNIQUE: Multidetector CT imaging of the chest was performed following the
standard protocol without IV contrast.

[Series 2: thorax 5.0 i31f 3 · axial · 0.74mm/px · z∈[-297,-42]mm · 12 of 57 slices shown, 15 images]
[im 3/57  mediastinal]
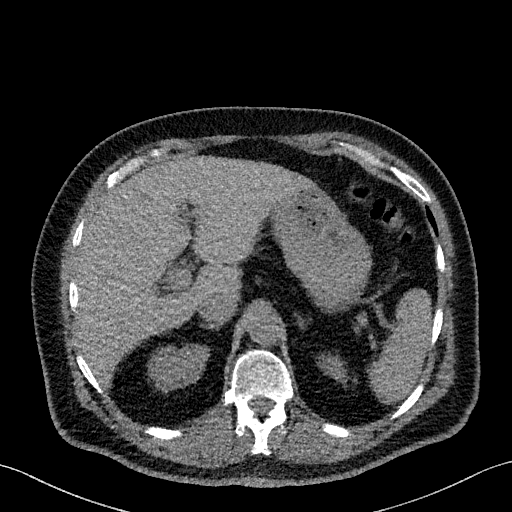
[im 3/57  lung]
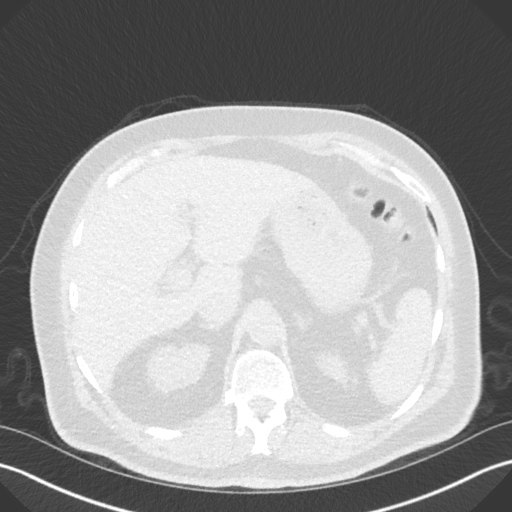
[im 8/57  lung]
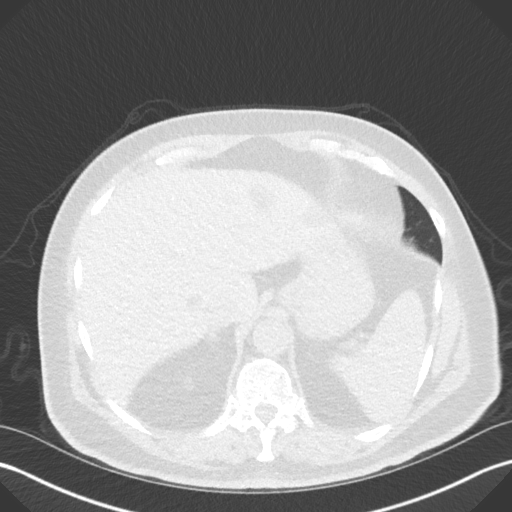
[im 13/57  lung]
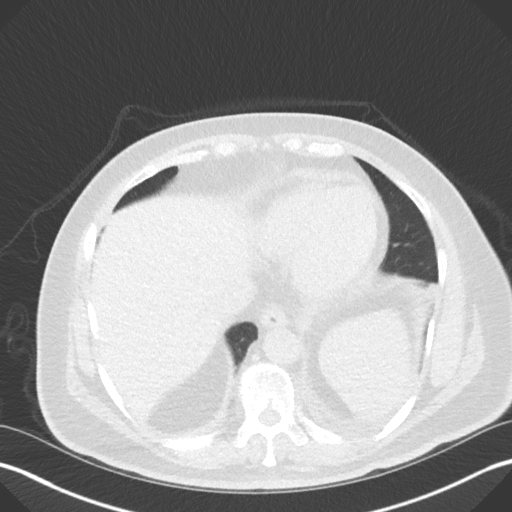
[im 18/57  lung]
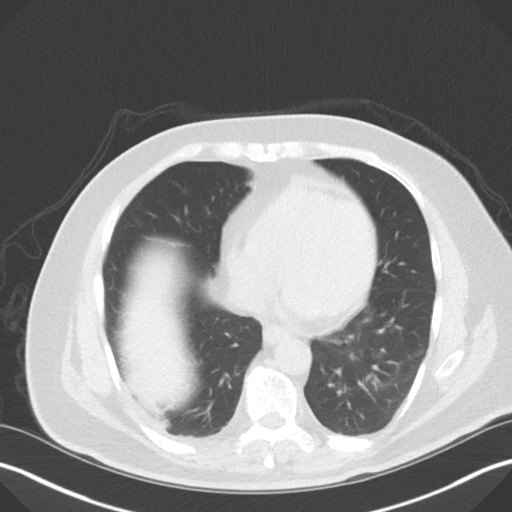
[im 22/57  mediastinal]
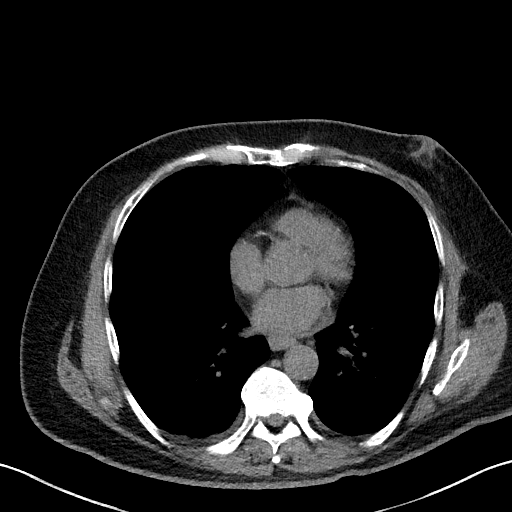
[im 22/57  lung]
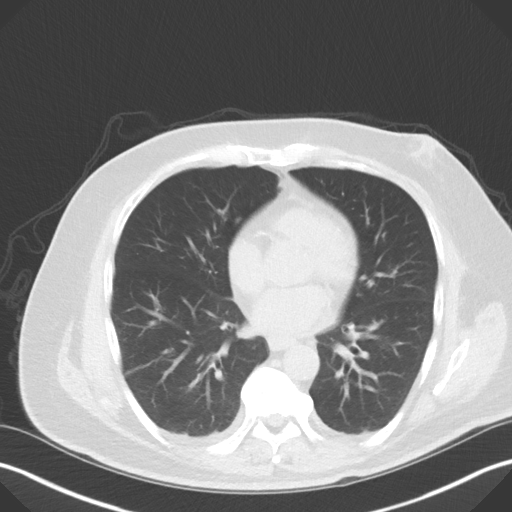
[im 27/57  lung]
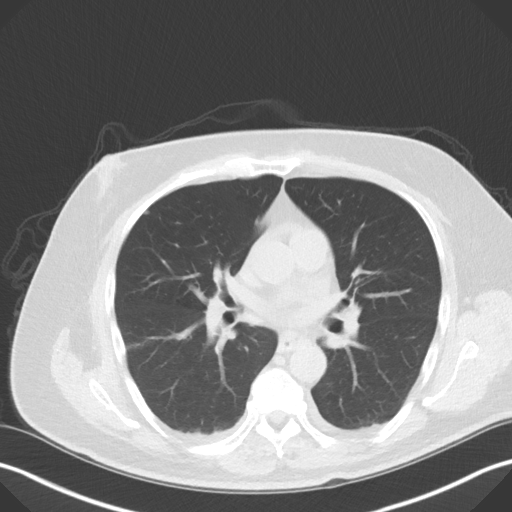
[im 30/57  lung]
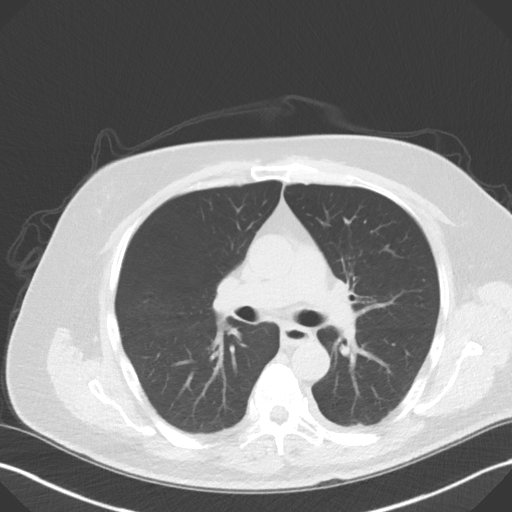
[im 35/57  lung]
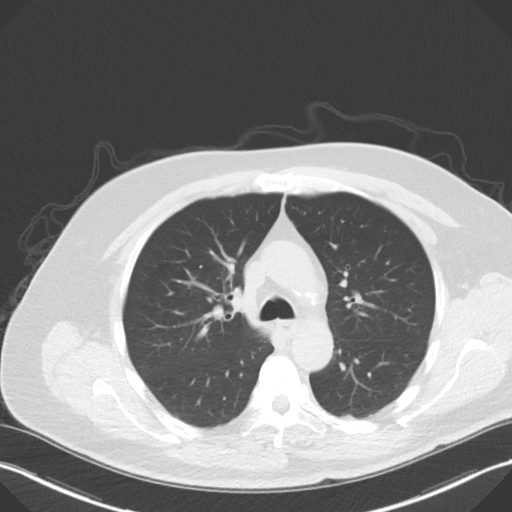
[im 39/57  mediastinal]
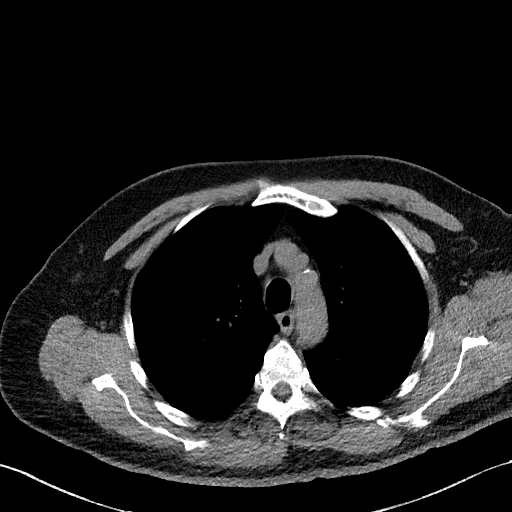
[im 39/57  lung]
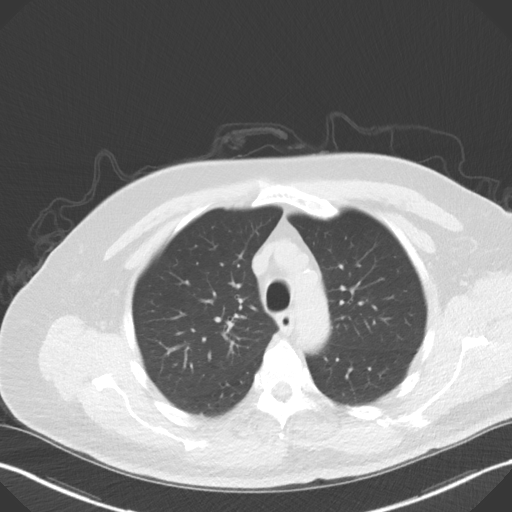
[im 44/57  lung]
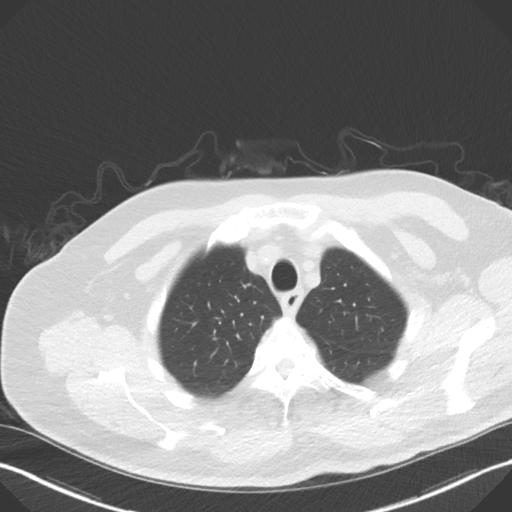
[im 49/57  lung]
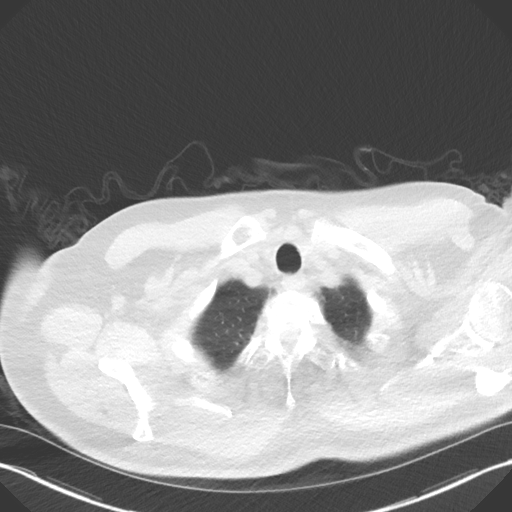
[im 54/57  lung]
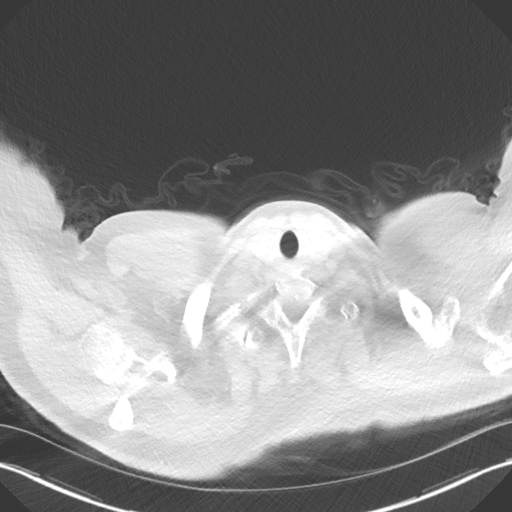

[Series 5: coronal · coronal · 0.57mm/px · 3 of 132 slices shown]
[im 27/132  lung]
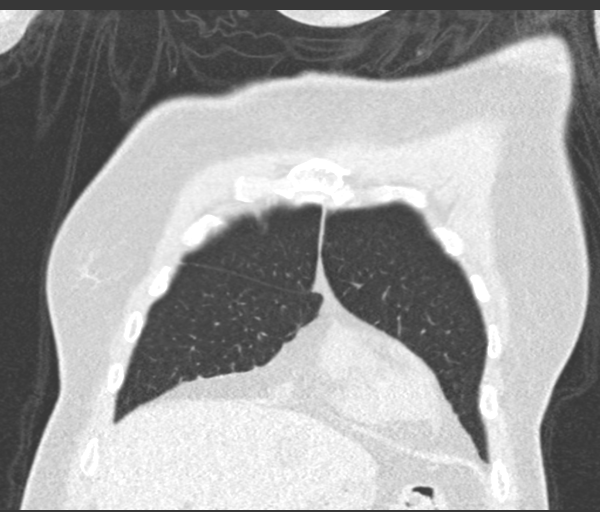
[im 53/132  lung]
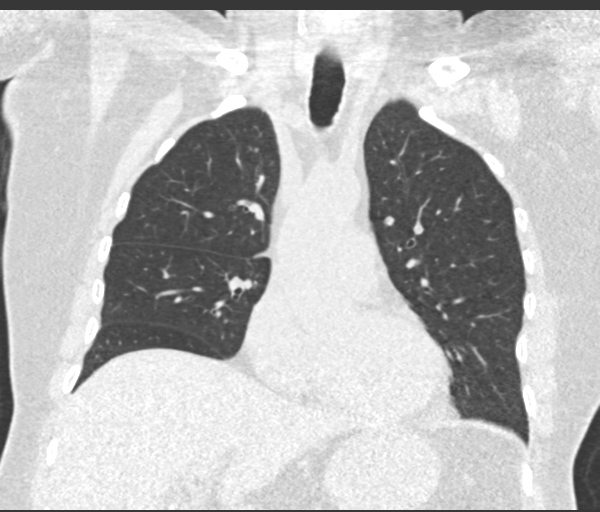
[im 79/132  lung]
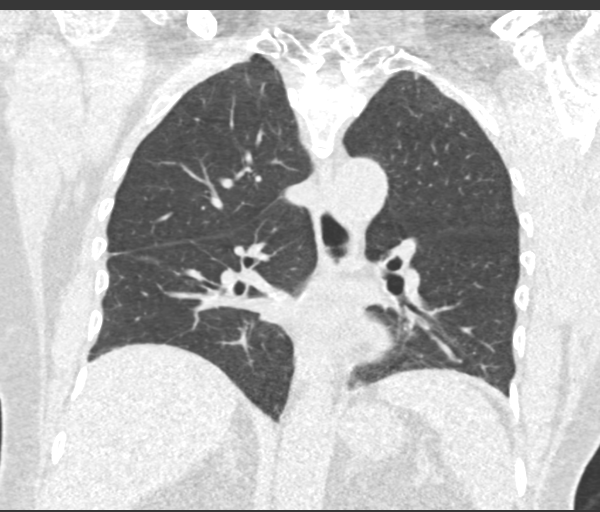

[15 of 40 positions shown; findings below may reference images not displayed]

FINDINGS: Cardiovascular: Atherosclerotic calcification of the arterial
vasculature, including coronary arteries. Heart size normal. Small
amount of pericardial fluid and/or thickening.

Mediastinum/Nodes: No pathologically enlarged mediastinal or
axillary lymph nodes. Hilar regions are difficult to evaluate
without IV contrast. Esophagus is grossly unremarkable.

Lungs/Pleura: Minimal biapical pleuroparenchymal scarring. Mild
scarring at the lung bases. Calcified granuloma in the left upper
lobe. A few scattered subpleural nodules measure up to 4 mm. There
are areas of scattered dependent atelectasis bilaterally. No pleural
fluid. Airway is unremarkable.

Upper Abdomen: Low-attenuation lesions in the liver measure up to
2.3 cm in the left hepatic lobe, as before. Visualized portions of
the adrenal glands, kidneys, spleen and stomach are grossly
unremarkable.

Musculoskeletal: No worrisome lytic or sclerotic lesions.
Degenerative changes are seen in the spine.
IMPRESSION: 1. Lung-RADS Category 2, benign appearance or behavior. Continue
annual screening with low-dose chest CT without contrast in 12
months.
2. Aortic atherosclerosis ([VE]-170.0). Coronary artery
calcification.

## 2016-10-07 ENCOUNTER — Other Ambulatory Visit: Payer: Self-pay | Admitting: Internal Medicine

## 2016-10-09 ENCOUNTER — Encounter: Payer: Self-pay | Admitting: Emergency Medicine

## 2016-10-09 NOTE — Progress Notes (Signed)
ATC all numbers listed in pt's chart and am unable to reach pt. Will send letter to pt's address to have him call us back to review results. Will sign off.

## 2016-10-12 ENCOUNTER — Telehealth: Payer: Self-pay | Admitting: Internal Medicine

## 2016-10-12 DIAGNOSIS — Z720 Tobacco use: Secondary | ICD-10-CM

## 2016-10-12 DIAGNOSIS — J438 Other emphysema: Secondary | ICD-10-CM

## 2016-10-12 NOTE — Telephone Encounter (Signed)
Notes recorded by Kalman Shan, MD on 10/06/2016 at 9:44 AM EDT Lung cancer screen without cancer. Do repeat low dose CT in 1 year. Give fu based on prior ov plan ---------------- Spoke with pt's caregiver Cherly Hensen, aware of recs.  1 yr rov ct ordered.  Nothing further needed.

## 2016-10-31 DIAGNOSIS — K50919 Crohn's disease, unspecified, with unspecified complications: Secondary | ICD-10-CM | POA: Diagnosis not present

## 2016-11-04 ENCOUNTER — Other Ambulatory Visit: Payer: Self-pay | Admitting: Internal Medicine

## 2016-12-13 DIAGNOSIS — N181 Chronic kidney disease, stage 1: Secondary | ICD-10-CM | POA: Diagnosis not present

## 2016-12-13 DIAGNOSIS — I129 Hypertensive chronic kidney disease with stage 1 through stage 4 chronic kidney disease, or unspecified chronic kidney disease: Secondary | ICD-10-CM | POA: Diagnosis not present

## 2017-01-12 ENCOUNTER — Encounter: Payer: Self-pay | Admitting: Internal Medicine

## 2017-01-12 ENCOUNTER — Ambulatory Visit (INDEPENDENT_AMBULATORY_CARE_PROVIDER_SITE_OTHER): Payer: Medicare Other | Admitting: Internal Medicine

## 2017-01-12 VITALS — BP 120/72 | HR 83 | Ht 71.0 in | Wt 205.0 lb

## 2017-01-12 DIAGNOSIS — J438 Other emphysema: Secondary | ICD-10-CM | POA: Diagnosis not present

## 2017-01-12 DIAGNOSIS — Z122 Encounter for screening for malignant neoplasm of respiratory organs: Secondary | ICD-10-CM | POA: Diagnosis not present

## 2017-01-12 DIAGNOSIS — Z720 Tobacco use: Secondary | ICD-10-CM

## 2017-01-12 DIAGNOSIS — I2510 Atherosclerotic heart disease of native coronary artery without angina pectoris: Secondary | ICD-10-CM | POA: Diagnosis not present

## 2017-01-12 HISTORY — DX: Atherosclerotic heart disease of native coronary artery without angina pectoris: I25.10

## 2017-01-12 NOTE — Patient Instructions (Addendum)
ICD-10-CM   1. Tobacco abuse Z72.0   2. Other emphysema ([place]) J43.8   3. Coronary artery calcification seen on CAT scan I25.10   4. Encounter for screening for lung cancer Z12.2    1. Tobacco abuse plaease work on quitting smoking  2. Other emphysema ([place]) Due to cough that persists, change spiriva to stiolto scheduled Albuterol as needed only Flu shot in fall Please talk to PCP Glendale Chard, MD -  and ensure you get  shingarix vaccine   3. Coronary artery calcification seen on CAT scan Refer Dr Irish Lack  4. Encounter for screening for lung cancer Next ct April/may 2019  Followup 9 months or sooner if needed

## 2017-01-12 NOTE — Progress Notes (Signed)
Subjective:     Patient ID: Chad Moss, male   DOB: 1956/05/25, 61 y.o.   MRN: [id number]    HPI    61 year old smoker with  emphysema  Chief Complaint  Patient presents with  . Acute Visit    Sob, when he lays down at night, cannot breath, dry, non-productive cough, throat itches, no chest tightness, pain in right side of the chest this morning.      He continues to smoke Accompanied by his girlfriend/caregiver-reports sudden onset worsening of his cough for the last 2-3 days, dry, complains of right-sided chest pain when he coughs which is episodic, and resolved spontaneously. He has not tried any over-the-counter medications Denies preceding URI symptoms or fever No pedal edema, orthopnea or paroxysmal nocturnal dyspnea, no chest pain on exertion  He is compliant with albuterol and this seems to relieve her symptoms  Significant tests/ events Spirometry 07/2015 Normal FEV1 but diffusion capacity is low at 56% and therefore he is isolated low diffusion capacity account of his emphysema   Screening CT chest/2017-  stable 4 mm right subpleural nodule   OV 01/12/2017  Chief Complaint  Patient presents with  . Follow-up    Pt c/o increase in dry cough. Pt denies chest congestion, f/c/s, CP/tightness. Pt states he feels his breathing is doing well.      Follow-up for he is here with his caretaker  Smoking: He continues to smoke. Because of his paranoid schizophrenia is unable to quit.  New finding: CT scan of the chest done for lung scan (screening shows coronary artery calcification. According to the caretaker he had a stress test many years ago. He does not have chest pain within the caretaker said that some years ago he did have chest pain. They're interested in seeing Dr Irish Lack o   Emphysema: He is on Spiriva but he is also taking his albuterol daily. He says that this is because of cough which is helped with albuterol. He is open to up- scaling his scheduled  inhaler  Lung cancer screening: He had a benign CT scan April 2018 as documented below. Next CT scan is -2019 IMPRESSION: 1. Lung-RADS Category 2, benign appearance or behavior. Continue annual screening with low-dose chest CT without contrast in 12 months. 2. Aortic atherosclerosis (ICD10-170.0). Coronary artery calcification.   Electronically Signed   By: Lorin Picket M.D.   On: 10/02/2016 12:54    has a past medical history of Allergic rhinitis; Atelectasis; COPD (chronic obstructive pulmonary disease) ([place]); Crohn's disease ([place]); Dysphagia; Dyspnea; Glaucoma; History of pneumonia; Hyperlipidemia; Hypertension; IBS (irritable bowel syndrome); Paranoid schizophrenia ([place]); Peripheral vascular disease ([place]); and Syncopal episodes.   reports that he has been smoking Cigarettes.  He has a 27.00 pack-year smoking history. He has never used smokeless tobacco.  Past Surgical History:  Procedure Laterality Date  . CHOLECYSTECTOMY    . COLON SURGERY  1996    Allergies  Allergen Reactions  . Benztropine Anaphylaxis  . Codeine Anaphylaxis  . Meperidine Swelling  . Cyclobenzaprine Other (See Comments)  . Penicillins     Has patient had a PCN reaction causing immediate rash, facial/tongue/throat swelling, SOB or lightheadedness with hypotension: Yes Has patient had a PCN reaction causing severe rash involving mucus membranes or skin necrosis: No Has patient had a PCN reaction that required hospitalization No Has patient had a PCN reaction occurring within the last 10 years: No If all of the above answers are "NO", then may proceed with Cephalosporin use.   Marland Kitchen  Sulfamethoxazole-Trimethoprim     REACTION: hives  . Sulfonamide Derivatives     REACTION: hives    Immunization History  Administered Date(s) Administered  . Influenza Split 04/06/2011, 03/12/2012, 03/31/2015  . Influenza Whole 05/10/2009  . Influenza,inj,Quad PF,36+ Mos 03/04/2013  . Influenza-Unspecified  04/19/2014  . Pneumococcal Conjugate-13 01/27/2009  . Pneumococcal Polysaccharide-23 06/19/2008    Family History  Problem Relation Age of Onset  . Hypertension Mother   . Heart disease Mother   . Hypertension Father   . Heart disease Father   . Lung cancer Brother        2006     Current Outpatient Prescriptions:  .  albuterol (PROVENTIL) (2.5 MG/3ML) 0.083% nebulizer solution, Take 3 mLs (2.5 mg total) by nebulization every 6 (six) hours as needed for wheezing or shortness of breath., Disp: 120 mL, Rfl: 12 .  Atorvastatin Calcium (LIPITOR PO), Take by mouth., Disp: , Rfl:  .  bimatoprost (LUMIGAN) 0.01 % SOLN, Place 1 drop into the left eye at bedtime., Disp: , Rfl:  .  brimonidine (ALPHAGAN) 0.15 % ophthalmic solution, Place 1 drop into the left eye 2 (two) times daily. , Disp: , Rfl:  .  cholestyramine (QUESTRAN) 4 G packet, Take 1 packet by mouth daily. , Disp: , Rfl:  .  clonazePAM (KLONOPIN) 1 MG tablet, Take 1 mg by mouth 3 (three) times daily as needed. Anxiety, Disp: , Rfl:  .  dicyclomine (BENTYL) 10 MG capsule, Take 10 mg by mouth 4 (four) times daily -  before meals and at bedtime. , Disp: , Rfl:  .  diphenhydrAMINE (BENADRYL) 25 MG tablet, Take 50 mg by mouth at bedtime. , Disp: , Rfl:  .  esomeprazole (NEXIUM) 20 MG capsule, Take 20 mg by mouth 2 (two) times daily. , Disp: , Rfl:  .  finasteride (PROSCAR) 5 MG tablet, Take 5 mg by mouth daily., Disp: , Rfl:  .  Homeopathic Products (CIRCULO-HEEL PO), Take 3 tablets by mouth daily., Disp: , Rfl:  .  loxapine (LOXITANE) 10 MG capsule, Take 10 mg by mouth 2 (two) times daily. 1 tab in AM and 2 tabs in PM, Disp: , Rfl:  .  mercaptopurine (PURINETHOL) 50 MG tablet, Take 75 mg by mouth daily. 1 tablet and a half tablet Give on an empty stomach 1 hour before or 2 hours after meals. Caution: Chemotherapy., Disp: , Rfl:  .  mesalamine (PENTASA) 250 MG CR capsule, Take 1,000 mg by mouth 3 (three) times daily. , Disp: , Rfl:  .   mirabegron ER (MYRBETRIQ) 50 MG TB24 tablet, Take 50 mg by mouth daily., Disp: , Rfl:  .  ondansetron (ZOFRAN) 8 MG tablet, Take 8 mg by mouth 2 (two) times daily., Disp: , Rfl:  .  potassium chloride (KLOR-CON) 10 MEQ CR tablet, Take 20 mEq by mouth daily. , Disp: , Rfl:  .  PROAIR HFA 108 (90 Base) MCG/ACT inhaler, INHALE 2 PUFFS INTO THE LUNGS EVERY 6 HOURS AS NEEDED FOR WHEEZING OR SHORTNESS OF BREATH, Disp: 8.5 g, Rfl: 0 .  SPIRIVA HANDIHALER 18 MCG inhalation capsule, INHALE THE CONTENT OF 1 CAPSULE VIA HANDIHALER DAILY, Disp: 30 capsule, Rfl: 0 .  timolol (TIMOPTIC) 0.5 % ophthalmic solution, Administer 1 drop into the left eye Two (2) times a day., Disp: , Rfl:  .  traZODone (DESYREL) 50 MG tablet, Take 150 mg by mouth at bedtime., Disp: , Rfl:  .  alfuzosin (UROXATRAL) 10 MG 24 hr tablet, Take  10 mg by mouth at bedtime. , Disp: , Rfl:  .  b complex vitamins tablet, Take 1 tablet by mouth daily.  , Disp: , Rfl:    Review of Systems     Objective:   Physical Exam  Constitutional: He is oriented to person, place, and time. He appears well-developed and well-nourished. No distress.  HENT:  Head: Normocephalic and atraumatic.  Right Ear: External ear normal.  Left Ear: External ear normal.  Mouth/Throat: Oropharynx is clear and moist. No oropharyngeal exudate.  Eyes: Pupils are equal, round, and reactive to light. Conjunctivae and EOM are normal. Right eye exhibits no discharge. Left eye exhibits no discharge. No scleral icterus.  Neck: Normal range of motion. Neck supple. No JVD present. No tracheal deviation present. No thyromegaly present.  Cardiovascular: Normal rate, regular rhythm and intact distal pulses.  Exam reveals no gallop and no friction rub.   No murmur heard. Pulmonary/Chest: Effort normal and breath sounds normal. No respiratory distress. He has no wheezes. He has no rales. He exhibits no tenderness.  Abdominal: Soft. Bowel sounds are normal. He exhibits no distension  and no mass. There is no tenderness. There is no rebound and no guarding.  Musculoskeletal: Normal range of motion. He exhibits no edema or tenderness.  Lymphadenopathy:    He has no cervical adenopathy.  Neurological: He is alert and oriented to person, place, and time. He has normal reflexes. No cranial nerve deficit. Coordination normal.  Skin: Skin is warm and dry. No rash noted. He is not diaphoretic. No erythema. No pallor.  Psychiatric: He has a normal mood and affect. His behavior is normal. Judgment and thought content normal.  Nursing note and vitals reviewed.  Vitals:   01/12/17 0957  BP: 120/72  Pulse: 83  SpO2: 97%  Weight: 205 lb (93 kg)  Height: 5' 11"  (1.803 m)   Estimated body mass index is 28.59 kg/m as calculated from the following:   Height as of this encounter: 5' 11"  (1.803 m).   Weight as of this encounter: 205 lb (93 kg).      Assessment:       ICD-10-CM   1. Tobacco abuse Z72.0   2. Other emphysema ([place]) J43.8   3. Coronary artery calcification seen on CAT scan I25.10   4. Encounter for screening for lung cancer Z12.2        Plan:      1. Tobacco abuse plaease work on quitting smoking  2. Other emphysema ([place]) Due to cough that persists, change spiriva to stiolto scheduled Albuterol as needed only Flu shot in fall Please talk to PCP Glendale Chard, MD -  and ensure you get  shingarix vaccine   3. Coronary artery calcification seen on CAT scan Refer Dr Irish Lack  4. Encounter for screening for lung cancer Next ct April/may 2019  Followup 9 months or sooner if needed   Dr. Brand Males, M.D., [facility].C.P Pulmonary and Critical Care Medicine Staff Physician [place] [facility] Pager: [phone], If no answer or between  15:00h - 7:00h: call [phone]  01/12/2017 10:16 AM

## 2017-01-19 DIAGNOSIS — H3412 Central retinal artery occlusion, left eye: Secondary | ICD-10-CM | POA: Diagnosis not present

## 2017-01-19 DIAGNOSIS — Q150 Congenital glaucoma: Secondary | ICD-10-CM | POA: Diagnosis not present

## 2017-01-19 DIAGNOSIS — H2513 Age-related nuclear cataract, bilateral: Secondary | ICD-10-CM | POA: Diagnosis not present

## 2017-02-28 ENCOUNTER — Encounter: Payer: Self-pay | Admitting: Interventional Cardiology

## 2017-02-28 ENCOUNTER — Ambulatory Visit (INDEPENDENT_AMBULATORY_CARE_PROVIDER_SITE_OTHER): Payer: Medicare Other | Admitting: Interventional Cardiology

## 2017-02-28 VITALS — BP 134/70 | HR 102 | Ht 71.0 in | Wt 205.0 lb

## 2017-02-28 DIAGNOSIS — Z720 Tobacco use: Secondary | ICD-10-CM

## 2017-02-28 DIAGNOSIS — E782 Mixed hyperlipidemia: Secondary | ICD-10-CM | POA: Diagnosis not present

## 2017-02-28 DIAGNOSIS — I739 Peripheral vascular disease, unspecified: Secondary | ICD-10-CM

## 2017-02-28 DIAGNOSIS — I2510 Atherosclerotic heart disease of native coronary artery without angina pectoris: Secondary | ICD-10-CM

## 2017-02-28 DIAGNOSIS — I209 Angina pectoris, unspecified: Secondary | ICD-10-CM | POA: Diagnosis not present

## 2017-02-28 NOTE — Patient Instructions (Signed)
Medication Instructions:  Your physician recommends that you continue on your current medications as directed. Please refer to the Current Medication list given to you today.   Labwork: LABS TODAY: CBC, BMET, PT/INR  Testing/Procedures: Your physician has requested that you have a cardiac catheterization on 9/25. Cardiac catheterization is used to diagnose and/or treat various heart conditions. Doctors may recommend this procedure for a number of different reasons. The most common reason is to evaluate chest pain. Chest pain can be a symptom of coronary artery disease (CAD), and cardiac catheterization can show whether plaque is narrowing or blocking your heart's arteries. This procedure is also used to evaluate the valves, as well as measure the blood flow and oxygen levels in different parts of your heart. For further information please visit [email or web address] Please follow instruction sheet, as given.    Follow-Up: Your physician wants you to follow-up in: 2 weeks after cardiac catheterization with Dr. Eldridge Dace or APP on his team.  Any Other Special Instructions Will Be Listed Below (If Applicable).    [facility] CARDIOVASCULAR DIVISION [facility] ST OFFICE [address] [place] [address] Dept: [phone] Loc: [phone]  Staley Lunz [place]  02/28/2017  You are scheduled for a Cardiac Catheterization on Tuesday, September 25 with Dr. Lance Muss.  1. Please arrive at the [facility] (Main Entrance A) at [facility]: [address] [place], [place] [address] at 8:00 AM (two hours before your procedure to ensure your preparation). Free valet parking service is available.   Special note: Every effort is made to have your procedure done on time. Please understand that emergencies sometimes delay scheduled procedures.  2. Diet: Do not eat or drink anything after midnight prior to your procedure except sips of  water to take medications.  3. Labs: LABS TODAY  4. Medication instructions in preparation for your procedure:  On the morning of your procedure, take your Aspirin and any morning medicines NOT listed above.  You may use sips of water.  5. Plan for one night stay--bring personal belongings. 6. Bring a current list of your medications and current insurance cards. 7. You MUST have a responsible person to drive you home. 8. Someone MUST be with you the first 24 hours after you arrive home or your discharge will be delayed. 9. Please wear clothes that are easy to get on and off and wear slip-on shoes.  Thank you for allowing [place] to care for you!   -- [facility]    If you need a refill on your cardiac medications before your next appointment, please call your pharmacy.

## 2017-02-28 NOTE — Progress Notes (Signed)
Cardiology Office Note   Date:  02/28/2017   ID:  Chad Moss, DOB 06/26/1955, MRN [id number]  PCP:  Chad Chard, MD    No chief complaint on file.  Coronary calcification  Wt Readings from Last 3 Encounters:  02/28/17 205 lb (93 kg)  01/12/17 205 lb (93 kg)  06/28/16 192 lb (87.1 kg)       History of Present Illness:  Chad Moss is a 61 y.o. male who is being seen today for the evaluation of Coronary calcification at the request of Chad Chard, MD. 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  He was treated at [facility].  He had a chest CT.  He was found to have coronary calcification.    Denies : Dizziness. Leg edema. Nitroglycerin use. Orthopnea. Palpitations. Paroxysmal nocturnal dyspnea. Shortness of breath. Syncope.   He does not do a lot of walking.  He walks for 10 minutes at a time daily.   He continues to smoke.  He can have pain with walking or with smoking.  It can come on when he walks.  He has some DOE with walking as well.     Past Medical History:  Diagnosis Date  . Allergic rhinitis   . Atelectasis   . COPD (chronic obstructive pulmonary disease) ([place])   . Crohn's disease ([place])   . Dysphagia   . Dyspnea   . Glaucoma   . History of pneumonia   . Hyperlipidemia   . Hypertension   . IBS (irritable bowel syndrome)   . Paranoid schizophrenia ([place])   . Peripheral vascular disease ([place])   . Syncopal episodes     Past Surgical History:  Procedure Laterality Date  . CHOLECYSTECTOMY    . COLON SURGERY  1996     Current Outpatient Prescriptions  Medication Sig Dispense Refill  . albuterol (PROVENTIL) (2.5 MG/3ML) 0.083% nebulizer solution Take 3 mLs (2.5 mg total) by nebulization every 6 (six) hours as needed for wheezing or shortness of breath. 120 mL 12  . alfuzosin (UROXATRAL) 10 MG 24 hr tablet Take 10 mg by mouth at bedtime.     Marland Kitchen aspirin EC 81 MG tablet Take 81 mg by mouth daily.    . Atorvastatin Calcium (LIPITOR PO) Take 10 mg by  mouth daily.     Marland Kitchen b complex vitamins tablet Take 1 tablet by mouth daily.      . bimatoprost (LUMIGAN) 0.01 % SOLN Place 1 drop into the left eye at bedtime.    . brimonidine (ALPHAGAN) 0.15 % ophthalmic solution Place 1 drop into the left eye 2 (two) times daily.     . cholestyramine (QUESTRAN) 4 G packet Take 1 packet by mouth daily.     . clonazePAM (KLONOPIN) 1 MG tablet Take 1 mg by mouth at bedtime. Anxiety    . dicyclomine (BENTYL) 10 MG capsule Take 10 mg by mouth 4 (four) times daily -  before meals and at bedtime.     . diphenhydrAMINE (BENADRYL) 25 MG tablet Take 50 mg by mouth at bedtime.     Marland Kitchen esomeprazole (NEXIUM) 20 MG capsule Take 20 mg by mouth 2 (two) times daily.     . finasteride (PROSCAR) 5 MG tablet Take 5 mg by mouth daily.    . hydrocortisone cream 0.5 % Apply 1 application topically 2 (two) times daily.    Marland Kitchen loxapine (LOXITANE) 10 MG capsule Take 10 mg by mouth 2 (two) times daily. 1 tab  in AM and 2 tabs in PM    . loxapine (LOXITANE) 25 MG capsule TAKE ONE CAPSULE BY MOUTH IN THE MORNING AND 2 CAPSULES AT NIGHT    . mercaptopurine (PURINETHOL) 50 MG tablet Take 75 mg by mouth daily. 1 tablet and a half tablet Give on an empty stomach 1 hour before or 2 hours after meals. Caution: Chemotherapy.    . mesalamine (PENTASA) 250 MG CR capsule Take 1,000 mg by mouth 3 (three) times daily.     . mirabegron ER (MYRBETRIQ) 50 MG TB24 tablet Take 50 mg by mouth daily.    . mirtazapine (REMERON) 30 MG tablet Take 30 mg by mouth.    . ondansetron (ZOFRAN) 8 MG tablet Take 8 mg by mouth 2 (two) times daily.    . potassium chloride (K-DUR) 10 MEQ tablet Take 10 mEq by mouth 2 (two) times daily.    Marland Kitchen PROAIR HFA 108 (90 Base) MCG/ACT inhaler INHALE 2 PUFFS INTO THE LUNGS EVERY 6 HOURS AS NEEDED FOR WHEEZING OR SHORTNESS OF BREATH 8.5 g 0  . SPIRIVA HANDIHALER 18 MCG inhalation capsule INHALE THE CONTENT OF 1 CAPSULE VIA HANDIHALER DAILY 30 capsule 0  . timolol (TIMOPTIC) 0.5 %  ophthalmic solution Administer 1 drop into the left eye Two (2) times a day.    . traZODone (DESYREL) 50 MG tablet Take 150 mg by mouth at bedtime.     No current facility-administered medications for this visit.     Allergies:   Benztropine; Codeine; Meperidine; Cyclobenzaprine; Sulfamethoxazole-trimethoprim; Sulfonamide derivatives; and Penicillins    Social History:  The patient  reports that he has been smoking Cigarettes.  He has a 27.00 pack-year smoking history. He has never used smokeless tobacco. He reports that he does not drink alcohol or use drugs.   Family History:  The patient's family history includes Heart disease in his father and mother; Hypertension in his father and mother; Lung cancer in his brother.    ROS:  Please see the history of present illness.   Otherwise, review of systems are positive for chest pain.   All other systems are reviewed and negative.    PHYSICAL EXAM: VS:  BP 134/70   Pulse (!) 102   Ht 5' 11"  (1.803 m)   Wt 205 lb (93 kg)   BMI 28.59 kg/m  , BMI Body mass index is 28.59 kg/m. GEN: Well nourished, well developed, in no acute distress  HEENT: normal  Neck: no JVD, carotid bruits, or masses Cardiac: RRR; no murmurs, rubs, or gallops,; bilateral lower extremity edema  Respiratory:  clear to auscultation bilaterally, normal work of breathing GI: soft, nontender, nondistended, + BS MS: no deformity or atrophy  Skin: warm and dry, no rash Neuro:  Strength and sensation are intact Psych: euthymic mood, full affect   EKG:   The ekg ordered today demonstrates sinus tachycardia, NSST   Recent Labs: No results found for requested labs within last 8760 hours.   Lipid Panel No results found for: CHOL, TRIG, HDL, CHOLHDL, VLDL, LDLCALC, LDLDIRECT   Other studies Reviewed: Additional studies/ records that were reviewed today with results demonstrating: Cr 2.03 in 2013.  Cr now below 1 in 5/18. Chest CT showing aortic and coronary  calcification.    ASSESSMENT AND PLAN:  1. Coronary calcification: He has some chest discomfort. There is some typical and some atypical features. Given that he has the discomfort every other day, I think we would not benefit from doing a  stress test. We'll plan for cardiac catheterization. The risks and benefits of the procedure were explained to the patient and he is willing to proceed. All questions were answered. 2. Hyperlipidemia: Continue atorvastatin 3. History of renal insufficiency. Resolved. 4. History of stroke and peripheral arterial disease involving his lower extremities. Cardiac catheterization was discussed with the patient fully. The patient understands that risks include but are not limited to stroke (1 in 1000), death (1 in 5), kidney failure [usually temporary] (1 in 500), bleeding (1 in 200), allergic reaction [possibly serious] (1 in 200).  The patient understands and is willing to proceed.   He needs to stop smoking.    Current medicines are reviewed at length with the patient today.  The patient concerns regarding his medicines were addressed.  The following changes have been made:  No change  Labs/ tests ordered today include: CBC, BMet No orders of the defined types were placed in this encounter.   Recommend 150 minutes/week of aerobic exercise Low fat, low carb, high fiber diet recommended  Disposition:   FU for cath   Signed, Larae Grooms, MD  02/28/2017 2:59 PM    [address] [place], [place], [place]  [address] Phone: [phone]; Fax: [phone]

## 2017-03-01 LAB — BASIC METABOLIC PANEL
BUN/Creatinine Ratio: 5 — ABNORMAL LOW (ref 10–24)
BUN: 5 mg/dL — ABNORMAL LOW (ref 8–27)
CO2: 22 mmol/L (ref 20–29)
Calcium: 9.2 mg/dL (ref 8.6–10.2)
Chloride: 104 mmol/L (ref 96–106)
Creatinine, Ser: 0.99 mg/dL (ref 0.76–1.27)
GFR calc Af Amer: 93 mL/min/{1.73_m2} (ref >59)
GFR calc non Af Amer: 81 mL/min/{1.73_m2} (ref >59)
Glucose: 105 mg/dL — ABNORMAL HIGH (ref 65–99)
Potassium: 3.6 mmol/L (ref 3.5–5.2)
Sodium: 143 mmol/L (ref 134–144)

## 2017-03-01 LAB — CBC
Hematocrit: 39 % (ref 37.5–51.0)
Hemoglobin: 13.6 g/dL (ref 13.0–17.7)
MCH: 31.9 pg (ref 26.6–33.0)
MCHC: 34.9 g/dL (ref 31.5–35.7)
MCV: 92 fL (ref 79–97)
Platelets: 241 10*3/uL (ref 150–379)
RBC: 4.26 x10E6/uL (ref 4.14–5.80)
RDW: 17 % — ABNORMAL HIGH (ref 12.3–15.4)
WBC: 7.1 10*3/uL (ref 3.4–10.8)

## 2017-03-01 LAB — PROTIME-INR
INR: 1 (ref 0.8–1.2)
Prothrombin Time: 10.1 s (ref 9.1–12.0)

## 2017-03-12 ENCOUNTER — Telehealth: Payer: Self-pay

## 2017-03-12 NOTE — Telephone Encounter (Signed)
Patient contacted pre-catheterization at [facility] scheduled for:  03/13/2017 @ 1030 Verified arrival time and place:  NT @ 0800 Confirmed AM meds to be taken pre-cath with sip of water: Take ASA Confirmed patient has responsible person to drive home post procedure and observe patient for 24 hours:  yes Addl concerns:  None noted

## 2017-03-13 ENCOUNTER — Ambulatory Visit (HOSPITAL_COMMUNITY)
Admission: RE | Admit: 2017-03-13 | Discharge: 2017-03-14 | Disposition: A | Discharge status: Home / Self Care | Payer: Medicare Other | Source: Ambulatory Visit | Attending: Interventional Cardiology | Admitting: Interventional Cardiology

## 2017-03-13 ENCOUNTER — Ambulatory Visit (HOSPITAL_COMMUNITY)
Admission: RE | Disposition: A | Discharge status: Home / Self Care | Payer: Self-pay | Source: Ambulatory Visit | Attending: Interventional Cardiology

## 2017-03-13 ENCOUNTER — Encounter (HOSPITAL_COMMUNITY): Payer: Self-pay | Admitting: General Practice

## 2017-03-13 DIAGNOSIS — Z8673 Personal history of transient ischemic attack (TIA), and cerebral infarction without residual deficits: Secondary | ICD-10-CM | POA: Diagnosis not present

## 2017-03-13 DIAGNOSIS — F200 Paranoid schizophrenia: Secondary | ICD-10-CM | POA: Diagnosis not present

## 2017-03-13 DIAGNOSIS — K589 Irritable bowel syndrome without diarrhea: Secondary | ICD-10-CM | POA: Diagnosis not present

## 2017-03-13 DIAGNOSIS — Z7982 Long term (current) use of aspirin: Secondary | ICD-10-CM | POA: Diagnosis not present

## 2017-03-13 DIAGNOSIS — Z888 Allergy status to other drugs, medicaments and biological substances status: Secondary | ICD-10-CM | POA: Diagnosis not present

## 2017-03-13 DIAGNOSIS — F17210 Nicotine dependence, cigarettes, uncomplicated: Secondary | ICD-10-CM | POA: Insufficient documentation

## 2017-03-13 DIAGNOSIS — I10 Essential (primary) hypertension: Secondary | ICD-10-CM

## 2017-03-13 DIAGNOSIS — H409 Unspecified glaucoma: Secondary | ICD-10-CM | POA: Insufficient documentation

## 2017-03-13 DIAGNOSIS — J441 Chronic obstructive pulmonary disease with (acute) exacerbation: Secondary | ICD-10-CM | POA: Diagnosis not present

## 2017-03-13 DIAGNOSIS — Z882 Allergy status to sulfonamides status: Secondary | ICD-10-CM | POA: Insufficient documentation

## 2017-03-13 DIAGNOSIS — E782 Mixed hyperlipidemia: Secondary | ICD-10-CM | POA: Diagnosis not present

## 2017-03-13 DIAGNOSIS — I739 Peripheral vascular disease, unspecified: Secondary | ICD-10-CM | POA: Diagnosis not present

## 2017-03-13 DIAGNOSIS — E785 Hyperlipidemia, unspecified: Secondary | ICD-10-CM | POA: Diagnosis not present

## 2017-03-13 DIAGNOSIS — I25119 Atherosclerotic heart disease of native coronary artery with unspecified angina pectoris: Secondary | ICD-10-CM | POA: Diagnosis not present

## 2017-03-13 DIAGNOSIS — Z955 Presence of coronary angioplasty implant and graft: Secondary | ICD-10-CM

## 2017-03-13 DIAGNOSIS — Z720 Tobacco use: Secondary | ICD-10-CM | POA: Diagnosis present

## 2017-03-13 DIAGNOSIS — Z880 Allergy status to penicillin: Secondary | ICD-10-CM | POA: Insufficient documentation

## 2017-03-13 DIAGNOSIS — J449 Chronic obstructive pulmonary disease, unspecified: Secondary | ICD-10-CM | POA: Diagnosis present

## 2017-03-13 DIAGNOSIS — Z79899 Other long term (current) drug therapy: Secondary | ICD-10-CM | POA: Insufficient documentation

## 2017-03-13 DIAGNOSIS — I2510 Atherosclerotic heart disease of native coronary artery without angina pectoris: Secondary | ICD-10-CM | POA: Diagnosis present

## 2017-03-13 HISTORY — DX: Atherosclerotic heart disease of native coronary artery with unspecified angina pectoris: I25.119

## 2017-03-13 HISTORY — DX: Major depressive disorder, single episode, unspecified: F32.9

## 2017-03-13 HISTORY — DX: Atherosclerotic heart disease of native coronary artery without angina pectoris: I25.10

## 2017-03-13 HISTORY — PX: CORONARY STENT INTERVENTION: CATH118234

## 2017-03-13 HISTORY — DX: Depression, unspecified: F32.A

## 2017-03-13 HISTORY — DX: Cardiac murmur, unspecified: R01.1

## 2017-03-13 HISTORY — DX: Personal history of peptic ulcer disease: Z87.11

## 2017-03-13 HISTORY — DX: Unspecified chronic bronchitis: J42

## 2017-03-13 HISTORY — PX: LEFT HEART CATH AND CORONARY ANGIOGRAPHY: CATH118249

## 2017-03-13 HISTORY — DX: Prediabetes: R73.03

## 2017-03-13 HISTORY — DX: Unspecified glaucoma: H40.9

## 2017-03-13 HISTORY — DX: Pneumonia, unspecified organism: J18.9

## 2017-03-13 HISTORY — PX: CORONARY ANGIOPLASTY WITH STENT PLACEMENT: SHX49

## 2017-03-13 HISTORY — DX: Benign prostatic hyperplasia without lower urinary tract symptoms: N40.0

## 2017-03-13 HISTORY — DX: Anxiety disorder, unspecified: F41.9

## 2017-03-13 HISTORY — DX: Personal history of other diseases of the digestive system: Z87.19

## 2017-03-13 HISTORY — DX: Acute embolism and thrombosis of unspecified deep veins of unspecified lower extremity: I82.409

## 2017-03-13 HISTORY — DX: Gastro-esophageal reflux disease without esophagitis: K21.9

## 2017-03-13 HISTORY — DX: Cerebral infarction, unspecified: I63.9

## 2017-03-13 LAB — POCT ACTIVATED CLOTTING TIME: Activated Clotting Time: 351 seconds

## 2017-03-13 SURGERY — LEFT HEART CATH AND CORONARY ANGIOGRAPHY
Anesthesia: LOCAL

## 2017-03-13 MED ORDER — DICYCLOMINE HCL 10 MG PO CAPS
10.0000 mg | ORAL_CAPSULE | Freq: Three times a day (TID) | ORAL | Status: DC
  Administered 2017-03-13 – 2017-03-14 (×3): 10 mg via ORAL
  Filled 2017-03-13 (×5): qty 1

## 2017-03-13 MED ORDER — MIRTAZAPINE 30 MG PO TABS
30.0000 mg | ORAL_TABLET | Freq: Every day | ORAL | Status: DC
  Administered 2017-03-13: 30 mg via ORAL
  Filled 2017-03-13: qty 1

## 2017-03-13 MED ORDER — VERAPAMIL HCL 2.5 MG/ML IV SOLN
INTRAVENOUS | Status: AC
  Filled 2017-03-13: qty 2

## 2017-03-13 MED ORDER — CLOPIDOGREL BISULFATE 75 MG PO TABS
75.0000 mg | ORAL_TABLET | Freq: Every day | ORAL | Status: DC
  Administered 2017-03-14: 75 mg via ORAL
  Filled 2017-03-13: qty 1

## 2017-03-13 MED ORDER — CLONAZEPAM 1 MG PO TABS
1.0000 mg | ORAL_TABLET | Freq: Every day | ORAL | Status: DC
  Administered 2017-03-13: 1 mg via ORAL
  Filled 2017-03-13: qty 1

## 2017-03-13 MED ORDER — ALFUZOSIN HCL ER 10 MG PO TB24
10.0000 mg | ORAL_TABLET | Freq: Every day | ORAL | Status: DC
  Administered 2017-03-13: 10 mg via ORAL
  Filled 2017-03-13: qty 1

## 2017-03-13 MED ORDER — SODIUM CHLORIDE 0.9 % WEIGHT BASED INFUSION
3.0000 mL/kg/h | INTRAVENOUS | Status: DC
  Administered 2017-03-13: 3 mL/kg/h via INTRAVENOUS

## 2017-03-13 MED ORDER — NITROGLYCERIN 1 MG/10 ML FOR IR/CATH LAB
INTRA_ARTERIAL | Status: DC | PRN
  Administered 2017-03-13: 200 ug via INTRACORONARY

## 2017-03-13 MED ORDER — ANGIOPLASTY BOOK
Freq: Once | Status: AC
  Administered 2017-03-14: 07:00:00
  Filled 2017-03-13: qty 1

## 2017-03-13 MED ORDER — HYDRALAZINE HCL 20 MG/ML IJ SOLN
5.0000 mg | INTRAMUSCULAR | Status: DC | PRN
  Administered 2017-03-13: 5 mg via INTRAVENOUS
  Filled 2017-03-13: qty 1

## 2017-03-13 MED ORDER — SODIUM CHLORIDE 0.9 % IV SOLN
INTRAVENOUS | Status: AC | PRN
  Administered 2017-03-13: 1.75 mg/kg/h via INTRAVENOUS

## 2017-03-13 MED ORDER — LIDOCAINE HCL (PF) 1 % IJ SOLN
INTRAMUSCULAR | Status: DC | PRN
  Administered 2017-03-13: 20 mL via INTRADERMAL

## 2017-03-13 MED ORDER — ATORVASTATIN CALCIUM 10 MG PO TABS
10.0000 mg | ORAL_TABLET | Freq: Every day | ORAL | Status: DC
  Administered 2017-03-13: 10 mg via ORAL
  Filled 2017-03-13: qty 1

## 2017-03-13 MED ORDER — SODIUM CHLORIDE 0.9 % IV SOLN: 250.0000 mL | INTRAVENOUS | Status: DC | PRN

## 2017-03-13 MED ORDER — DIPHENHYDRAMINE HCL 25 MG PO TABS
50.0000 mg | ORAL_TABLET | Freq: Every day | ORAL | Status: DC
  Administered 2017-03-13: 50 mg via ORAL
  Filled 2017-03-13 (×2): qty 2

## 2017-03-13 MED ORDER — SODIUM CHLORIDE 0.9% FLUSH: 3.0000 mL | INTRAVENOUS | Status: DC | PRN

## 2017-03-13 MED ORDER — MIRABEGRON ER 50 MG PO TB24
50.0000 mg | ORAL_TABLET | Freq: Every day | ORAL | Status: DC
  Administered 2017-03-14: 50 mg via ORAL
  Filled 2017-03-13: qty 1

## 2017-03-13 MED ORDER — ASPIRIN 81 MG PO CHEW
81.0000 mg | CHEWABLE_TABLET | Freq: Every day | ORAL | Status: DC
  Administered 2017-03-14: 81 mg via ORAL
  Filled 2017-03-13: qty 1

## 2017-03-13 MED ORDER — INFLUENZA VAC SPLIT QUAD 0.5 ML IM SUSY: 0.5000 mL | PREFILLED_SYRINGE | INTRAMUSCULAR | Status: DC

## 2017-03-13 MED ORDER — ASPIRIN EC 81 MG PO TBEC: 81.0000 mg | DELAYED_RELEASE_TABLET | Freq: Every day | ORAL | Status: DC

## 2017-03-13 MED ORDER — BIVALIRUDIN TRIFLUOROACETATE 250 MG IV SOLR
INTRAVENOUS | Status: AC
  Filled 2017-03-13: qty 250

## 2017-03-13 MED ORDER — TRAZODONE HCL 150 MG PO TABS
150.0000 mg | ORAL_TABLET | Freq: Every day | ORAL | Status: DC
  Administered 2017-03-13: 150 mg via ORAL
  Filled 2017-03-13: qty 1

## 2017-03-13 MED ORDER — MIDAZOLAM HCL 2 MG/2ML IJ SOLN
INTRAMUSCULAR | Status: AC
  Filled 2017-03-13: qty 2

## 2017-03-13 MED ORDER — BIVALIRUDIN BOLUS VIA INFUSION - CUPID
INTRAVENOUS | Status: DC | PRN
  Administered 2017-03-13: 69.75 mg via INTRAVENOUS

## 2017-03-13 MED ORDER — NITROGLYCERIN 1 MG/10 ML FOR IR/CATH LAB
INTRA_ARTERIAL | Status: AC
  Filled 2017-03-13: qty 10

## 2017-03-13 MED ORDER — MIDAZOLAM HCL 2 MG/2ML IJ SOLN
INTRAMUSCULAR | Status: DC | PRN
  Administered 2017-03-13: 1 mg via INTRAVENOUS

## 2017-03-13 MED ORDER — LATANOPROST 0.005 % OP SOLN
1.0000 [drp] | Freq: Every day | OPHTHALMIC | Status: DC
  Administered 2017-03-13: 1 [drp] via OPHTHALMIC
  Filled 2017-03-13: qty 2.5

## 2017-03-13 MED ORDER — HEPARIN SODIUM (PORCINE) 1000 UNIT/ML IJ SOLN
INTRAMUSCULAR | Status: AC
  Filled 2017-03-13: qty 1

## 2017-03-13 MED ORDER — FINASTERIDE 5 MG PO TABS
5.0000 mg | ORAL_TABLET | Freq: Every day | ORAL | Status: DC
  Administered 2017-03-14: 5 mg via ORAL
  Filled 2017-03-13 (×2): qty 1

## 2017-03-13 MED ORDER — FAMOTIDINE IN NACL 20-0.9 MG/50ML-% IV SOLN
INTRAVENOUS | Status: AC
  Filled 2017-03-13: qty 50

## 2017-03-13 MED ORDER — SODIUM CHLORIDE 0.9% FLUSH
3.0000 mL | Freq: Two times a day (BID) | INTRAVENOUS | Status: DC
  Administered 2017-03-13 (×2): 3 mL via INTRAVENOUS

## 2017-03-13 MED ORDER — IOPAMIDOL (ISOVUE-370) INJECTION 76%
INTRAVENOUS | Status: DC | PRN
  Administered 2017-03-13: 115 mL via INTRA_ARTERIAL

## 2017-03-13 MED ORDER — HEPARIN (PORCINE) IN NACL 2-0.9 UNIT/ML-% IJ SOLN
INTRAMUSCULAR | Status: AC
  Filled 2017-03-13: qty 1000

## 2017-03-13 MED ORDER — LOXAPINE SUCCINATE 5 MG PO CAPS
10.0000 mg | ORAL_CAPSULE | Freq: Every day | ORAL | Status: DC
  Administered 2017-03-14: 10 mg via ORAL
  Filled 2017-03-13: qty 2

## 2017-03-13 MED ORDER — MESALAMINE ER 250 MG PO CPCR
1000.0000 mg | ORAL_CAPSULE | Freq: Three times a day (TID) | ORAL | Status: DC
  Administered 2017-03-13 – 2017-03-14 (×3): 1000 mg via ORAL
  Filled 2017-03-13 (×4): qty 4

## 2017-03-13 MED ORDER — POTASSIUM CHLORIDE ER 10 MEQ PO TBCR
10.0000 meq | EXTENDED_RELEASE_TABLET | Freq: Two times a day (BID) | ORAL | Status: DC
  Administered 2017-03-13 – 2017-03-14 (×2): 10 meq via ORAL
  Filled 2017-03-13 (×5): qty 1

## 2017-03-13 MED ORDER — ALBUTEROL SULFATE (2.5 MG/3ML) 0.083% IN NEBU: 2.5000 mg | INHALATION_SOLUTION | Freq: Four times a day (QID) | RESPIRATORY_TRACT | Status: DC | PRN

## 2017-03-13 MED ORDER — ASPIRIN 81 MG PO CHEW: 81.0000 mg | CHEWABLE_TABLET | ORAL | Status: DC

## 2017-03-13 MED ORDER — HYDRALAZINE HCL 20 MG/ML IJ SOLN
5.0000 mg | INTRAMUSCULAR | Status: DC | PRN
  Administered 2017-03-13 (×3): 5 mg via INTRAVENOUS
  Filled 2017-03-13 (×3): qty 1

## 2017-03-13 MED ORDER — LOXAPINE SUCCINATE 5 MG PO CAPS
20.0000 mg | ORAL_CAPSULE | Freq: Every day | ORAL | Status: DC
  Administered 2017-03-13: 20 mg via ORAL
  Filled 2017-03-13: qty 4

## 2017-03-13 MED ORDER — LABETALOL HCL 5 MG/ML IV SOLN
INTRAVENOUS | Status: AC
  Filled 2017-03-13: qty 4

## 2017-03-13 MED ORDER — ACETAMINOPHEN 325 MG PO TABS
650.0000 mg | ORAL_TABLET | ORAL | Status: DC | PRN
  Administered 2017-03-13: 650 mg via ORAL
  Filled 2017-03-13: qty 2

## 2017-03-13 MED ORDER — ONDANSETRON HCL 4 MG PO TABS
8.0000 mg | ORAL_TABLET | Freq: Two times a day (BID) | ORAL | Status: DC
  Administered 2017-03-13 – 2017-03-14 (×2): 8 mg via ORAL
  Filled 2017-03-13 (×2): qty 2

## 2017-03-13 MED ORDER — FAMOTIDINE IN NACL 20-0.9 MG/50ML-% IV SOLN
INTRAVENOUS | Status: AC | PRN
  Administered 2017-03-13: 20 mg via INTRAVENOUS

## 2017-03-13 MED ORDER — MERCAPTOPURINE 50 MG PO TABS
75.0000 mg | ORAL_TABLET | Freq: Every day | ORAL | Status: DC
  Administered 2017-03-14: 75 mg via ORAL
  Filled 2017-03-13: qty 2

## 2017-03-13 MED ORDER — FENTANYL CITRATE (PF) 100 MCG/2ML IJ SOLN
INTRAMUSCULAR | Status: AC
  Filled 2017-03-13: qty 2

## 2017-03-13 MED ORDER — LABETALOL HCL 5 MG/ML IV SOLN
INTRAVENOUS | Status: DC | PRN
  Administered 2017-03-13: 20 mg via INTRAVENOUS

## 2017-03-13 MED ORDER — FENTANYL CITRATE (PF) 100 MCG/2ML IJ SOLN
INTRAMUSCULAR | Status: DC | PRN
  Administered 2017-03-13: 25 ug via INTRAVENOUS

## 2017-03-13 MED ORDER — HYDRALAZINE HCL 20 MG/ML IJ SOLN
INTRAMUSCULAR | Status: AC
  Filled 2017-03-13: qty 1

## 2017-03-13 MED ORDER — IOPAMIDOL (ISOVUE-370) INJECTION 76%
INTRAVENOUS | Status: AC
  Filled 2017-03-13: qty 100

## 2017-03-13 MED ORDER — LIDOCAINE HCL 2 % IJ SOLN
INTRAMUSCULAR | Status: AC
  Filled 2017-03-13: qty 10

## 2017-03-13 MED ORDER — CLOPIDOGREL BISULFATE 300 MG PO TABS
ORAL_TABLET | ORAL | Status: DC | PRN
  Administered 2017-03-13: 600 mg via ORAL

## 2017-03-13 MED ORDER — BRIMONIDINE TARTRATE 0.15 % OP SOLN
1.0000 [drp] | Freq: Two times a day (BID) | OPHTHALMIC | Status: DC
  Administered 2017-03-13 – 2017-03-14 (×2): 1 [drp] via OPHTHALMIC
  Filled 2017-03-13: qty 5

## 2017-03-13 MED ORDER — HYDRALAZINE HCL 20 MG/ML IJ SOLN
INTRAMUSCULAR | Status: DC | PRN
  Administered 2017-03-13 (×2): 10 mg via INTRAVENOUS

## 2017-03-13 MED ORDER — CLOPIDOGREL BISULFATE 300 MG PO TABS
ORAL_TABLET | ORAL | Status: AC
  Filled 2017-03-13: qty 2

## 2017-03-13 MED ORDER — TIMOLOL MALEATE 0.5 % OP SOLN
1.0000 [drp] | Freq: Two times a day (BID) | OPHTHALMIC | Status: DC
  Administered 2017-03-13 – 2017-03-14 (×2): 1 [drp] via OPHTHALMIC
  Filled 2017-03-13: qty 5

## 2017-03-13 MED ORDER — CHOLESTYRAMINE 4 G PO PACK
1.0000 | PACK | Freq: Every day | ORAL | Status: DC
  Administered 2017-03-14: 1 via ORAL
  Filled 2017-03-13: qty 1

## 2017-03-13 MED ORDER — SODIUM CHLORIDE 0.9 % IV SOLN
INTRAVENOUS | Status: DC
  Administered 2017-03-13: 14:00:00 via INTRAVENOUS

## 2017-03-13 MED ORDER — SODIUM CHLORIDE 0.9 % WEIGHT BASED INFUSION: 1.0000 mL/kg/h | INTRAVENOUS | Status: DC

## 2017-03-13 MED ORDER — ONDANSETRON HCL 4 MG/2ML IJ SOLN
4.0000 mg | Freq: Four times a day (QID) | INTRAMUSCULAR | Status: DC | PRN
  Administered 2017-03-13: 4 mg via INTRAVENOUS
  Filled 2017-03-13: qty 2

## 2017-03-13 MED ORDER — HEPARIN (PORCINE) IN NACL 2-0.9 UNIT/ML-% IJ SOLN
INTRAMUSCULAR | Status: AC | PRN
  Administered 2017-03-13: 1000 mL via INTRA_ARTERIAL

## 2017-03-13 MED ORDER — SODIUM CHLORIDE 0.9% FLUSH: 3.0000 mL | Freq: Two times a day (BID) | INTRAVENOUS | Status: DC

## 2017-03-13 MED ORDER — LABETALOL HCL 5 MG/ML IV SOLN: 10.0000 mg | INTRAVENOUS | Status: DC | PRN

## 2017-03-13 SURGICAL SUPPLY — 23 items
BALLN EMERGE MR 2.0X15 (BALLOONS) ×2
BALLOON EMERGE MR 2.0X15 (BALLOONS) IMPLANT
CATH INFINITI 5FR JL4 (CATHETERS) ×1 IMPLANT
CATH INFINITI JR4 5F (CATHETERS) ×1 IMPLANT
CATH LAUNCHER 6FR EBU3.5 (CATHETERS) ×2 IMPLANT
COVER PRB 48X5XTLSCP FOLD TPE (BAG) IMPLANT
COVER PROBE 5X48 (BAG) ×2
DEVICE CONTINUOUS FLUSH (MISCELLANEOUS) ×1 IMPLANT
DEVICE RAD COMP TR BAND LRG (VASCULAR PRODUCTS) ×1 IMPLANT
GLIDESHEATH SLEND SS 6F .021 (SHEATH) ×1 IMPLANT
GUIDEWIRE INQWIRE 1.5J.035X260 (WIRE) IMPLANT
INQWIRE 1.5J .035X260CM (WIRE) ×2
KIT ENCORE 26 ADVANTAGE (KITS) ×1 IMPLANT
KIT HEART LEFT (KITS) ×2 IMPLANT
PACK CARDIAC CATHETERIZATION (CUSTOM PROCEDURE TRAY) ×2 IMPLANT
SHEATH PINNACLE 5F 10CM (SHEATH) ×1 IMPLANT
SHEATH PINNACLE 6F 10CM (SHEATH) ×1 IMPLANT
STENT RESOLUTE ONYX 2.75X22 (Permanent Stent) ×1 IMPLANT
TRANSDUCER W/STOPCOCK (MISCELLANEOUS) ×2 IMPLANT
TUBING CIL FLEX 10 FLL-RA (TUBING) ×2 IMPLANT
VALVE GUARDIAN II ~~LOC~~ HEMO (MISCELLANEOUS) ×1 IMPLANT
WIRE ASAHI PROWATER 180CM (WIRE) ×1 IMPLANT
WIRE HI TORQ VERSACORE-J 145CM (WIRE) ×1 IMPLANT

## 2017-03-13 NOTE — Progress Notes (Signed)
TR BAND REMOVAL  LOCATION:    right radial  DEFLATED PER PROTOCOL:    Yes.    TIME BAND OFF / DRESSING APPLIED:    1940    SITE UPON ARRIVAL:    Level 0  SITE AFTER BAND REMOVAL:    Level 0  CIRCULATION SENSATION AND MOVEMENT:    Within Normal Limits   Yes.    COMMENTS:   Radial site teaching reinforced with family and patient. Verbalizes and demonstrates understanding.

## 2017-03-13 NOTE — H&P (View-Only) (Signed)
Cardiology Office Note   Date:  02/28/2017   ID:  Chad Moss, DOB 1955/09/09, MRN [id number]  PCP:  Glendale Chard, MD    No chief complaint on file.  Coronary calcification  Wt Readings from Last 3 Encounters:  02/28/17 205 lb (93 kg)  01/12/17 205 lb (93 kg)  06/28/16 192 lb (87.1 kg)       History of Present Illness:  Chad Moss is a 61 y.o. male who is being seen today for the evaluation of Coronary calcification at the request of Glendale Chard, MD. 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  He was treated at [facility].  He had a chest CT.  He was found to have coronary calcification.    Denies : Dizziness. Leg edema. Nitroglycerin use. Orthopnea. Palpitations. Paroxysmal nocturnal dyspnea. Shortness of breath. Syncope.   He does not do a lot of walking.  He walks for 10 minutes at a time daily.   He continues to smoke.  He can have pain with walking or with smoking.  It can come on when he walks.  He has some DOE with walking as well.     Past Medical History:  Diagnosis Date  . Allergic rhinitis   . Atelectasis   . COPD (chronic obstructive pulmonary disease) ([place])   . Crohn's disease ([place])   . Dysphagia   . Dyspnea   . Glaucoma   . History of pneumonia   . Hyperlipidemia   . Hypertension   . IBS (irritable bowel syndrome)   . Paranoid schizophrenia ([place])   . Peripheral vascular disease ([place])   . Syncopal episodes     Past Surgical History:  Procedure Laterality Date  . CHOLECYSTECTOMY    . COLON SURGERY  1996     Current Outpatient Prescriptions  Medication Sig Dispense Refill  . albuterol (PROVENTIL) (2.5 MG/3ML) 0.083% nebulizer solution Take 3 mLs (2.5 mg total) by nebulization every 6 (six) hours as needed for wheezing or shortness of breath. 120 mL 12  . alfuzosin (UROXATRAL) 10 MG 24 hr tablet Take 10 mg by mouth at bedtime.     Marland Kitchen aspirin EC 81 MG tablet Take 81 mg by mouth daily.    . Atorvastatin Calcium (LIPITOR PO) Take 10 mg by  mouth daily.     Marland Kitchen b complex vitamins tablet Take 1 tablet by mouth daily.      . bimatoprost (LUMIGAN) 0.01 % SOLN Place 1 drop into the left eye at bedtime.    . brimonidine (ALPHAGAN) 0.15 % ophthalmic solution Place 1 drop into the left eye 2 (two) times daily.     . cholestyramine (QUESTRAN) 4 G packet Take 1 packet by mouth daily.     . clonazePAM (KLONOPIN) 1 MG tablet Take 1 mg by mouth at bedtime. Anxiety    . dicyclomine (BENTYL) 10 MG capsule Take 10 mg by mouth 4 (four) times daily -  before meals and at bedtime.     . diphenhydrAMINE (BENADRYL) 25 MG tablet Take 50 mg by mouth at bedtime.     Marland Kitchen esomeprazole (NEXIUM) 20 MG capsule Take 20 mg by mouth 2 (two) times daily.     . finasteride (PROSCAR) 5 MG tablet Take 5 mg by mouth daily.    . hydrocortisone cream 0.5 % Apply 1 application topically 2 (two) times daily.    Marland Kitchen loxapine (LOXITANE) 10 MG capsule Take 10 mg by mouth 2 (two) times daily. 1 tab  in AM and 2 tabs in PM    . loxapine (LOXITANE) 25 MG capsule TAKE ONE CAPSULE BY MOUTH IN THE MORNING AND 2 CAPSULES AT NIGHT    . mercaptopurine (PURINETHOL) 50 MG tablet Take 75 mg by mouth daily. 1 tablet and a half tablet Give on an empty stomach 1 hour before or 2 hours after meals. Caution: Chemotherapy.    . mesalamine (PENTASA) 250 MG CR capsule Take 1,000 mg by mouth 3 (three) times daily.     . mirabegron ER (MYRBETRIQ) 50 MG TB24 tablet Take 50 mg by mouth daily.    . mirtazapine (REMERON) 30 MG tablet Take 30 mg by mouth.    . ondansetron (ZOFRAN) 8 MG tablet Take 8 mg by mouth 2 (two) times daily.    . potassium chloride (K-DUR) 10 MEQ tablet Take 10 mEq by mouth 2 (two) times daily.    Marland Kitchen PROAIR HFA 108 (90 Base) MCG/ACT inhaler INHALE 2 PUFFS INTO THE LUNGS EVERY 6 HOURS AS NEEDED FOR WHEEZING OR SHORTNESS OF BREATH 8.5 g 0  . SPIRIVA HANDIHALER 18 MCG inhalation capsule INHALE THE CONTENT OF 1 CAPSULE VIA HANDIHALER DAILY 30 capsule 0  . timolol (TIMOPTIC) 0.5 %  ophthalmic solution Administer 1 drop into the left eye Two (2) times a day.    . traZODone (DESYREL) 50 MG tablet Take 150 mg by mouth at bedtime.     No current facility-administered medications for this visit.     Allergies:   Benztropine; Codeine; Meperidine; Cyclobenzaprine; Sulfamethoxazole-trimethoprim; Sulfonamide derivatives; and Penicillins    Social History:  The patient  reports that he has been smoking Cigarettes.  He has a 27.00 pack-year smoking history. He has never used smokeless tobacco. He reports that he does not drink alcohol or use drugs.   Family History:  The patient's family history includes Heart disease in his father and mother; Hypertension in his father and mother; Lung cancer in his brother.    ROS:  Please see the history of present illness.   Otherwise, review of systems are positive for chest pain.   All other systems are reviewed and negative.    PHYSICAL EXAM: VS:  BP 134/70   Pulse (!) 102   Ht 5' 11"  (1.803 m)   Wt 205 lb (93 kg)   BMI 28.59 kg/m  , BMI Body mass index is 28.59 kg/m. GEN: Well nourished, well developed, in no acute distress  HEENT: normal  Neck: no JVD, carotid bruits, or masses Cardiac: RRR; no murmurs, rubs, or gallops,; bilateral lower extremity edema  Respiratory:  clear to auscultation bilaterally, normal work of breathing GI: soft, nontender, nondistended, + BS MS: no deformity or atrophy  Skin: warm and dry, no rash Neuro:  Strength and sensation are intact Psych: euthymic mood, full affect   EKG:   The ekg ordered today demonstrates sinus tachycardia, NSST   Recent Labs: No results found for requested labs within last 8760 hours.   Lipid Panel No results found for: CHOL, TRIG, HDL, CHOLHDL, VLDL, LDLCALC, LDLDIRECT   Other studies Reviewed: Additional studies/ records that were reviewed today with results demonstrating: Cr 2.03 in 2013.  Cr now below 1 in 5/18. Chest CT showing aortic and coronary  calcification.    ASSESSMENT AND PLAN:  1. Coronary calcification: He has some chest discomfort. There is some typical and some atypical features. Given that he has the discomfort every other day, I think we would not benefit from doing a  stress test. We'll plan for cardiac catheterization. The risks and benefits of the procedure were explained to the patient and he is willing to proceed. All questions were answered. 2. Hyperlipidemia: Continue atorvastatin 3. History of renal insufficiency. Resolved. 4. History of stroke and peripheral arterial disease involving his lower extremities. Cardiac catheterization was discussed with the patient fully. The patient understands that risks include but are not limited to stroke (1 in 1000), death (1 in 6), kidney failure [usually temporary] (1 in 500), bleeding (1 in 200), allergic reaction [possibly serious] (1 in 200).  The patient understands and is willing to proceed.   He needs to stop smoking.    Current medicines are reviewed at length with the patient today.  The patient concerns regarding his medicines were addressed.  The following changes have been made:  No change  Labs/ tests ordered today include: CBC, BMet No orders of the defined types were placed in this encounter.   Recommend 150 minutes/week of aerobic exercise Low fat, low carb, high fiber diet recommended  Disposition:   FU for cath   Signed, Larae Grooms, MD  02/28/2017 2:59 PM    [address], [place], [place]  [address] Phone: [phone]; Fax: [phone]

## 2017-03-13 NOTE — Interval H&P Note (Signed)
Cath Lab Visit (complete for each Cath Lab visit)  Clinical Evaluation Leading to the Procedure:   ACS: No.  Non-ACS:    Anginal Classification: CCS III  Anti-ischemic medical therapy: Minimal Therapy (1 class of medications)  Non-Invasive Test Results: No non-invasive testing performed  Prior CABG: No previous CABG  Recurrent angina.     History and Physical Interval Note:  03/13/2017 11:47 AM  Chad Moss  has presented today for surgery, with the diagnosis of angina  The various methods of treatment have been discussed with the patient and family. After consideration of risks, benefits and other options for treatment, the patient has consented to  Procedure(s): LEFT HEART CATH AND CORONARY ANGIOGRAPHY (N/A) as a surgical intervention .  The patient's history has been reviewed, patient examined, no change in status, stable for surgery.  I have reviewed the patient's chart and labs.  Questions were answered to the patient's satisfaction.     Larae Grooms

## 2017-03-13 NOTE — Progress Notes (Addendum)
Site area: Rt fem art 75fr sheath removed Site Prior to Removal:  Level 0 Pressure Applied For:34min Manual:   Yes Patient Status During Pull:  A/O Post Pull Site:  Level 0 Post Pull Instructions Given:  Yes, and pts understands. Pts personal care giver in room and and was instructed also. Dressing Applied:  Tegaderm and a 2x2 Bedrest begins @  18:15:00 Comments: Vidal Schwalbe Abordo-RN in to elvaluate rt groin. Level 0. Pt is a/o. Rt groin was unremarkable and dressing CDI. Distal pulses 2+ rt dp/pt.

## 2017-03-14 ENCOUNTER — Telehealth: Payer: Self-pay | Admitting: Cardiology

## 2017-03-14 ENCOUNTER — Telehealth: Payer: Self-pay | Admitting: Internal Medicine

## 2017-03-14 ENCOUNTER — Encounter (HOSPITAL_COMMUNITY): Payer: Self-pay | Admitting: Cardiology

## 2017-03-14 DIAGNOSIS — I10 Essential (primary) hypertension: Secondary | ICD-10-CM

## 2017-03-14 DIAGNOSIS — Z882 Allergy status to sulfonamides status: Secondary | ICD-10-CM | POA: Diagnosis not present

## 2017-03-14 DIAGNOSIS — I739 Peripheral vascular disease, unspecified: Secondary | ICD-10-CM

## 2017-03-14 DIAGNOSIS — Z720 Tobacco use: Secondary | ICD-10-CM | POA: Diagnosis not present

## 2017-03-14 DIAGNOSIS — Z8673 Personal history of transient ischemic attack (TIA), and cerebral infarction without residual deficits: Secondary | ICD-10-CM | POA: Diagnosis not present

## 2017-03-14 DIAGNOSIS — E785 Hyperlipidemia, unspecified: Secondary | ICD-10-CM | POA: Diagnosis not present

## 2017-03-14 DIAGNOSIS — E782 Mixed hyperlipidemia: Secondary | ICD-10-CM | POA: Diagnosis not present

## 2017-03-14 DIAGNOSIS — J441 Chronic obstructive pulmonary disease with (acute) exacerbation: Secondary | ICD-10-CM | POA: Diagnosis not present

## 2017-03-14 DIAGNOSIS — Z888 Allergy status to other drugs, medicaments and biological substances status: Secondary | ICD-10-CM | POA: Diagnosis not present

## 2017-03-14 DIAGNOSIS — Z7982 Long term (current) use of aspirin: Secondary | ICD-10-CM | POA: Diagnosis not present

## 2017-03-14 DIAGNOSIS — I25119 Atherosclerotic heart disease of native coronary artery with unspecified angina pectoris: Secondary | ICD-10-CM | POA: Diagnosis not present

## 2017-03-14 DIAGNOSIS — Z79899 Other long term (current) drug therapy: Secondary | ICD-10-CM | POA: Diagnosis not present

## 2017-03-14 DIAGNOSIS — K589 Irritable bowel syndrome without diarrhea: Secondary | ICD-10-CM | POA: Diagnosis not present

## 2017-03-14 DIAGNOSIS — Z880 Allergy status to penicillin: Secondary | ICD-10-CM | POA: Diagnosis not present

## 2017-03-14 DIAGNOSIS — H409 Unspecified glaucoma: Secondary | ICD-10-CM | POA: Diagnosis not present

## 2017-03-14 LAB — CBC
HCT: 37.7 % — ABNORMAL LOW (ref 39.0–52.0)
Hemoglobin: 13.5 g/dL (ref 13.0–17.0)
MCH: 32.1 pg (ref 26.0–34.0)
MCHC: 35.8 g/dL (ref 30.0–36.0)
MCV: 89.5 fL (ref 78.0–100.0)
Platelets: 196 10*3/uL (ref 150–400)
RBC: 4.21 MIL/uL — ABNORMAL LOW (ref 4.22–5.81)
RDW: 15.4 % (ref 11.5–15.5)
WBC: 10.2 10*3/uL (ref 4.0–10.5)

## 2017-03-14 LAB — BASIC METABOLIC PANEL
Anion gap: 9 (ref 5–15)
BUN: 5 mg/dL — ABNORMAL LOW (ref 6–20)
CO2: 24 mmol/L (ref 22–32)
Calcium: 8.8 mg/dL — ABNORMAL LOW (ref 8.9–10.3)
Chloride: 102 mmol/L (ref 101–111)
Creatinine, Ser: 1.04 mg/dL (ref 0.61–1.24)
GFR calc Af Amer: >60 mL/min (ref >60)
GFR calc non Af Amer: >60 mL/min (ref >60)
Glucose, Bld: 110 mg/dL — ABNORMAL HIGH (ref 65–99)
Potassium: 3.7 mmol/L (ref 3.5–5.1)
Sodium: 135 mmol/L (ref 135–145)

## 2017-03-14 MED ORDER — ATORVASTATIN CALCIUM 40 MG PO TABS: 40.0000 mg | ORAL_TABLET | Freq: Every day | ORAL | 1 refills | Status: DC

## 2017-03-14 MED ORDER — NICOTINE 21-14-7 MG/24HR TD KIT: 1.0000 | PACK | TRANSDERMAL | 0 refills | Status: DC

## 2017-03-14 MED ORDER — AMLODIPINE BESYLATE 5 MG PO TABS: 5.0000 mg | ORAL_TABLET | Freq: Every day | ORAL | 2 refills | Status: DC

## 2017-03-14 MED ORDER — CLOPIDOGREL BISULFATE 75 MG PO TABS: 75.0000 mg | ORAL_TABLET | Freq: Every day | ORAL | 2 refills | Status: DC

## 2017-03-14 MED ORDER — AMLODIPINE BESYLATE 5 MG PO TABS
5.0000 mg | ORAL_TABLET | Freq: Every day | ORAL | Status: DC
  Administered 2017-03-14: 5 mg via ORAL
  Filled 2017-03-14: qty 1

## 2017-03-14 MED ORDER — NITROGLYCERIN 0.4 MG SL SUBL: 0.4000 mg | SUBLINGUAL_TABLET | SUBLINGUAL | 2 refills | Status: DC | PRN

## 2017-03-14 MED ORDER — PANTOPRAZOLE SODIUM 40 MG PO TBEC: 40.0000 mg | DELAYED_RELEASE_TABLET | Freq: Every day | ORAL | 1 refills | Status: DC

## 2017-03-14 MED FILL — Heparin Sodium (Porcine) Inj 1000 Unit/ML: INTRAMUSCULAR | Qty: 10 | Status: AC

## 2017-03-14 MED FILL — Verapamil HCl IV Soln 2.5 MG/ML: INTRAVENOUS | Qty: 2 | Status: AC

## 2017-03-14 MED FILL — Lidocaine HCl Local Inj 2%: INTRAMUSCULAR | Qty: 10 | Status: AC

## 2017-03-14 NOTE — Discharge Summary (Signed)
The patient has been seen in conjunction with Laverda Page, NP=C. All aspects of care have been considered and discussed. The patient has been personally interviewed, examined, and all clinical data has been reviewed.   Has ambulated and is currently bathing. No recurrence of chest discomfort during this hospital stay. No discomfort with activity. Uneventful evening after PCI other than nasal congestion.  Radial cath site unremarkable. Lungs are clear.  Digital images reviewed and PCI result is excellent.  Dual antiplatelet therapy for at least 6 months since PCI done in non-ACS setting.  Blood pressure at times his been significantly elevated. Given history of stroke and now CAD, will add low-dose amlodipine. We will also intensify statin therapy by increasing atorvastatin 40 mg daily.  Okay to use nonsedating antihistamine therapy for nasal congestion.  TOC follow-up 7-10 days.   Discharge Summary    Patient ID: Chad Moss,  MRN: [id number], DOB/AGE: [birth year] 61 y.o.  Admit date: 03/13/2017 Discharge date: 03/14/2017  Primary Care Provider: Dorothyann Peng Primary Cardiologist: Eldridge Dace   Discharge Diagnoses    Principal Problem:   Coronary artery disease involving native coronary artery of native heart with angina pectoris [facility]) Active Problems:   Mixed hyperlipidemia   PERIPHERAL VASCULAR DISEASE   Tobacco abuse   COPD exacerbation (HCC)   COLD (chronic obstructive lung disease) (HCC)   Coronary artery calcification seen on CAT scan   Essential hypertension   Allergies Allergies  Allergen Reactions  . Benztropine Anaphylaxis  . Codeine Anaphylaxis  . Meperidine Swelling  . Cyclobenzaprine Other (See Comments)    Pt. Does not remember  . Penicillins Rash    Has patient had a PCN reaction causing immediate rash, facial/tongue/throat swelling, SOB or lightheadedness with hypotension: Yes Has patient had a PCN reaction causing severe rash involving  mucus membranes or skin necrosis: No Has patient had a PCN reaction that required hospitalization No Has patient had a PCN reaction occurring within the last 10 years: No If all of the above answers are "NO", then may proceed with Cephalosporin use.   . Sulfamethoxazole-Trimethoprim     REACTION: hives  . Sulfonamide Derivatives     REACTION: hives    Diagnostic Studies/Procedures    Cath: 03/13/17   Conclusion     Prox RCA lesion, 25 %stenosed.  Mid LAD lesion, 25 %stenosed.  Ost 2nd Diag to 2nd Diag lesion, 25 %stenosed.  The left ventricular systolic function is normal.  LV end diastolic pressure is normal.  The left ventricular ejection fraction is 55-65% by visual estimate.  There is no aortic valve stenosis.  Mid Cx lesion, 75 %stenosed.  A STENT RESOLUTE ONYX [id number] drug eluting stent was successfully placed.  Post intervention, there is a 0% residual stenosis.  Unable to use right radial artery due to high bifurcation of radial and ulnar arteries.   He will need dual antiplatelet therapy for 6-12 months, along with aggressive secondary prevention including smoking cessation.   Plan for discharge tomorrow.    _____________   History of Present Illness     61 yo male with PMH of HTN, HL, COPD, PVD, CVA and crohn's disease who was referred to Dr. Eldridge Dace. He was seen in the office on 02/28/17 and reported exertional chest pain, and DOE. Noted to have both typical and atypical features, but symptoms seemed to be progressing. CT chest showed aortic and coronary calcifications. Given his CRFs he was referred for outpatient cath.   Hospital Course  Underwent cardiac cath with Dr. Eldridge Dace noted above with successful PCI/DES to 75% mLcx lesion. Plan for DAPT with ASA/plavix for 6-12 months. Post cath labs showed Cr 1.04 and Hgb 13.5. No complications noted post cath. Worked with cardiac rehab without any further chest pain. Blood pressures were noted to be  elevated this admission and amlodipine  daily was added at discharge. Statin therapy was increased from  daily to  daily. Encouraged to follow up with blood pressures at home and bring to follow up appt.   [name] was seen by Dr. Katrinka Blazing and determined stable for discharge home. Follow up in the office has been arranged. Medications are listed below.   _____________  Discharge Vitals Blood pressure (!) 163/94, pulse 95, temperature 98.5 F (36.9 C), temperature source Oral, resp. rate 17, height  (1.803 m), weight 209 lb 7 oz (95 kg), SpO2 97 %.  Filed Weights   03/13/17 0816 03/14/17 0319  Weight: 205 lb (93 kg) 209 lb 7 oz (95 kg)    Labs & Radiologic Studies    CBC  Recent Labs  03/14/17 0256  WBC 10.2  HGB 13.5  HCT 37.7*  MCV 89.5  PLT 196   Basic Metabolic Panel  Recent Labs  03/14/17 0256  NA 135  K 3.7  CL 102  CO2 24  GLUCOSE 110*  BUN 5*  CREATININE 1.04  CALCIUM 8.8*   Liver Function Tests No results for input(s): AST, ALT, ALKPHOS, BILITOT, PROT, ALBUMIN in the last 72 hours. No results for input(s): LIPASE, AMYLASE in the last 72 hours. Cardiac Enzymes No results for input(s): CKTOTAL, CKMB, CKMBINDEX, TROPONINI in the last 72 hours. BNP Invalid input(s): POCBNP D-Dimer No results for input(s): DDIMER in the last 72 hours. Hemoglobin A1C No results for input(s): HGBA1C in the last 72 hours. Fasting Lipid Panel No results for input(s): CHOL, HDL, LDLCALC, TRIG, CHOLHDL, LDLDIRECT in the last 72 hours. Thyroid Function Tests No results for input(s): TSH, T4TOTAL, T3FREE, THYROIDAB in the last 72 hours.  Invalid input(s): FREET3 _____________  No results found. Disposition   Pt is being discharged home today in good condition.  Follow-up Plans & Appointments    Follow-up Information    Allayne Butcher, PA-C Follow up on 03/22/2017.   Specialties:  Cardiology, Radiology Why:  at 9:30am for your follow up appt.    Contact information: [address] [place] [address] [phone]          Discharge Instructions    Amb Referral to Cardiac Rehabilitation    Complete by:  As directed    Diagnosis:  Coronary Stents   Call MD for:  redness, tenderness, or signs of infection (pain, swelling, redness, odor or green/yellow discharge around incision site)    Complete by:  As directed    Diet - low sodium heart healthy    Complete by:  As directed    Discharge instructions    Complete by:  As directed    Radial Site Care Refer to this sheet in the next few weeks. These instructions provide you with information on caring for yourself after your procedure. Your caregiver may also give you more specific instructions. Your treatment has been planned according to current medical practices, but problems sometimes occur. Call your caregiver if you have any problems or questions after your procedure. HOME CARE INSTRUCTIONS You may shower the day after the procedure.Remove the bandage (dressing) and gently wash the site with  plain soap and water.Gently pat the site dry.  Do not apply powder or lotion to the site.  Do not submerge the affected site in water for 3 to 5 days.  Inspect the site at least twice daily.  Do not flex or bend the affected arm for 24 hours.  No lifting over 5 pounds (2.3 kg) for 5 days after your procedure.  Do not drive home if you are discharged the same day of the procedure. Have someone else drive you.  You may drive 24 hours after the procedure unless otherwise instructed by your caregiver.  What to expect: Any bruising will usually fade within 1 to 2 weeks.  Blood that collects in the tissue (hematoma) may be painful to the touch. It should usually decrease in size and tenderness within 1 to 2 weeks.  SEEK IMMEDIATE MEDICAL CARE IF: You have unusual pain at the radial site.  You have redness, warmth, swelling, or pain at the radial site.  You have drainage (other  than a small amount of blood on the dressing).  You have chills.  You have a fever or persistent symptoms for more than 72 hours.  You have a fever and your symptoms suddenly get worse.  Your arm becomes pale, cool, tingly, or numb.  You have heavy bleeding from the site. Hold pressure on the site.   PLEASE DO NOT MISS ANY DOSES OF YOUR PLAVIX!!!!! Also keep a log of you blood pressures and bring back to your follow up appt. Please call the office with any questions.   Patients taking blood thinners should generally stay away from medicines like ibuprofen, Advil, Motrin, naproxen, and Aleve due to risk of stomach bleeding. You may take Tylenol as directed or talk to your primary doctor about alternatives.  Some studies suggest Prilosec/Omeprazole interacts with Plavix. We changed your Prilosec/Omeprazole to the equivalent dose of Protonix for less chance of interaction.   Increase activity slowly    Complete by:  As directed       Discharge Medications     Medication List    STOP taking these medications   esomeprazole 20 MG capsule Commonly known as:  NEXIUM     TAKE these medications   albuterol (2.5 MG/3ML) 0.083% nebulizer solution Commonly known as:  PROVENTIL Take 3 mLs (2.5 mg total) by nebulization every 6 (six) hours as needed for wheezing or shortness of breath.   PROAIR HFA 108 (90 Base) MCG/ACT inhaler Generic drug:  albuterol INHALE 2 PUFFS INTO THE LUNGS EVERY 6 HOURS AS NEEDED FOR WHEEZING OR SHORTNESS OF BREATH   alfuzosin 10 MG 24 hr tablet Commonly known as:  UROXATRAL Take 10 mg by mouth at bedtime.   amLODipine 5 MG tablet Commonly known as:  NORVASC Take 1 tablet (5 mg total) by mouth daily.   aspirin EC 81 MG tablet Take 81 mg by mouth daily.   atorvastatin 40 MG tablet Commonly known as:  LIPITOR Take 1 tablet (40 mg total) by mouth daily at 6 PM. What changed:  medication strength  how much to take  when to take this   bimatoprost  0.01 % Soln Commonly known as:  LUMIGAN Place 1 drop into the left eye at bedtime.   brimonidine 0.15 % ophthalmic solution Commonly known as:  ALPHAGAN Place 1 drop into the left eye 2 (two) times daily.   cholestyramine 4 g packet Commonly known as:  QUESTRAN Take 1 packet by mouth daily.   clonazePAM 1  MG tablet Commonly known as:  KLONOPIN Take 1 mg by mouth at bedtime. Anxiety   clopidogrel 75 MG tablet Commonly known as:  PLAVIX Take 1 tablet (75 mg total) by mouth daily with breakfast.   dicyclomine 10 MG capsule Commonly known as:  BENTYL Take 10 mg by mouth 4 (four) times daily -  before meals and at bedtime.   diphenhydrAMINE 25 MG tablet Commonly known as:  BENADRYL Take 50 mg by mouth at bedtime.   finasteride 5 MG tablet Commonly known as:  PROSCAR Take 5 mg by mouth daily.   hydrocortisone cream 0.5 % Apply 1 application topically 2 (two) times daily.   loxapine 10 MG capsule Commonly known as:  LOXITANE Take 10 mg by mouth 2 (two) times daily. 1 tab in AM and 2 tabs in PM   mercaptopurine 50 MG tablet Commonly known as:  PURINETHOL Take 75 mg by mouth daily. 1 tablet and a half tablet Give on an empty stomach 1 hour before or 2 hours after meals. Caution: Chemotherapy.   mesalamine 250 MG CR capsule Commonly known as:  PENTASA Take 1,000 mg by mouth 3 (three) times daily.   mirtazapine 30 MG tablet Commonly known as:  REMERON Take 30 mg by mouth at bedtime.   MYRBETRIQ 50 MG Tb24 tablet Generic drug:  mirabegron ER Take 50 mg by mouth daily.   nitroGLYCERIN 0.4 MG SL tablet Commonly known as:  NITROSTAT Place 1 tablet (0.4 mg total) under the tongue every 5 (five) minutes as needed.   ondansetron 8 MG tablet Commonly known as:  ZOFRAN Take 8 mg by mouth 2 (two) times daily.   pantoprazole 40 MG tablet Commonly known as:  PROTONIX Take 1 tablet (40 mg total) by mouth daily.   potassium chloride 10 MEQ tablet Commonly known as:   K-DUR Take 10 mEq by mouth 2 (two) times daily.   SPIRIVA HANDIHALER 18 MCG inhalation capsule Generic drug:  tiotropium INHALE THE CONTENT OF 1 CAPSULE VIA HANDIHALER DAILY   timolol 0.5 % ophthalmic solution Commonly known as:  TIMOPTIC Administer 1 drop into the left eye Two (2) times a day.   traZODone 50 MG tablet Commonly known as:  DESYREL Take 150 mg by mouth at bedtime.        Aspirin prescribed at discharge?  Yes High Intensity Statin Prescribed? (Lipitor 40-80mg  or Crestor 20-40mg ): Yes Beta Blocker Prescribed? No: COPD For EF <40%, was ACEI/ARB Prescribed? No: EF ok ADP Receptor Inhibitor Prescribed? (i.e. Plavix etc.-Includes Medically Managed Patients): Yes For EF <40%, Aldosterone Inhibitor Prescribed? No: EF ok Was EF assessed during THIS hospitalization? Yes Was Cardiac Rehab II ordered? (Included Medically managed Patients): Yes   Outstanding Labs/Studies   FLP/LFTs in 6 weeks if tolerating statin increase.   Duration of Discharge Encounter   Greater than 30 minutes including physician time.  Signed, Laverda Page NP-C 03/14/2017, 10:31 AM

## 2017-03-14 NOTE — Care Management Note (Signed)
Case Management Note  Patient Details  Name: Chad Moss MRN: [id number] Date of Birth: Dec 30, 1955  Subjective/Objective:   From home, pta indep, s/p coronary stent intervention , will be on plavix.                   Action/Plan: Dc home today, no needs.   Expected Discharge Date:                  Expected Discharge Plan:  Home/Self Care  In-House Referral:     Discharge planning Services  CM Consult  Post Acute Care Choice:    Choice offered to:     DME Arranged:    DME Agency:     HH Arranged:    HH Agency:     Status of Service:  Completed, signed off  If discussed at [employer] of Stay Meetings, dates discussed:    Additional Comments:  Leone Haven, RN 03/14/2017, 9:36 AM

## 2017-03-14 NOTE — Progress Notes (Signed)
CARDIAC REHAB PHASE I   PRE:  Rate/Rhythm: 104 ST  BP:  Supine: 153/92  Sitting:   Standing:    SaO2: 99%RA  MODE:  Ambulation: 800 ft   POST:  Rate/Rhythm: 122 ST  BP:  Supine:   Sitting: 140/95  Standing:    SaO2: 98%RA 0805-0925 Pt walked 800 ft with steady gait. No CP. Tolerated well. Education completed with pt and caregiver who voiced understanding. Stressed importance of plavix with stent. Reviewed NTG use, ex ed and heart healthy diet. Discussed CRP 2 and will refer to [place]. Pt has [place] mailing address but lives in [place]. Gave pt fake cigarette and smoking cessation handout. Encouraged to call 1800quitnow. Pt stated he will not be able to quit on his own. To talk with cardiologist re medication or prescription for nicotine patches so that it will be covered by Medicaid. Stressed several times with pt need to quit due to more plaque buildup in future. Pt with many questions during ed that were addressed. Care giver to reinforce ed.   Luetta Nutting, RN BSN  03/14/2017 9:19 AM

## 2017-03-14 NOTE — Telephone Encounter (Signed)
New message   TOC appt made with Mendel Ryder for 7-10 days per PA. Appt 10/4 @9 :30 Simmons,Brittany

## 2017-03-14 NOTE — Telephone Encounter (Signed)
Spoke with pt's caretaker, Mliss Sax. Bernadette states pt shad stent placement yesterday and is currently in the process of being discharged. Pt has developed cold symptoms and request for Rx to be sent to [facility] [address]. Pt reports of nasal drainage with yellow in color, headache, head congestion & prod cough with yellow mucus x1d  MR please advise. Thanks.

## 2017-03-14 NOTE — Telephone Encounter (Signed)
lmomtcb x 1 for [employer]

## 2017-03-14 NOTE — Telephone Encounter (Signed)
Attempted to contact patient via phone. Message said "Chad Moss sorry your call cannot be connected right now. I will try again later.

## 2017-03-14 NOTE — Telephone Encounter (Signed)
If he is in the hospital - they need to notify the admi/discharge MD  For sinus symptoms  Take doxycycline 154m po twice daily x 5 days; take after meals and avoid sunlight  Please take prednisone 40 mg x1 day, then 30 mg x1 day, then 20 mg x1 day, then 10 mg x1 day, and then 5 mg x1 day and stop   Allergies  Allergen Reactions  . Benztropine Anaphylaxis  . Codeine Anaphylaxis  . Meperidine Swelling  . Cyclobenzaprine Other (See Comments)    Pt. Does not remember  . Penicillins Rash    Has patient had a PCN reaction causing immediate rash, facial/tongue/throat swelling, SOB or lightheadedness with hypotension: Yes Has patient had a PCN reaction causing severe rash involving mucus membranes or skin necrosis: No Has patient had a PCN reaction that required hospitalization No Has patient had a PCN reaction occurring within the last 10 years: No If all of the above answers are "NO", then may proceed with Cephalosporin use.   . Sulfamethoxazole-Trimethoprim     REACTION: hives  . Sulfonamide Derivatives     REACTION: hives

## 2017-03-14 NOTE — Telephone Encounter (Signed)
Again, attempted to call patient. Call cannot be completed.

## 2017-03-15 ENCOUNTER — Other Ambulatory Visit: Payer: Self-pay | Admitting: Interventional Cardiology

## 2017-03-15 MED ORDER — PREDNISONE 10 MG PO TABS: ORAL_TABLET | ORAL | 0 refills | Status: DC

## 2017-03-15 MED ORDER — CLOPIDOGREL BISULFATE 75 MG PO TABS: 75.0000 mg | ORAL_TABLET | Freq: Every day | ORAL | 1 refills | Status: DC

## 2017-03-15 MED ORDER — AMLODIPINE BESYLATE 5 MG PO TABS: 5.0000 mg | ORAL_TABLET | Freq: Every day | ORAL | 1 refills | Status: DC

## 2017-03-15 MED ORDER — NITROGLYCERIN 0.4 MG SL SUBL: 0.4000 mg | SUBLINGUAL_TABLET | SUBLINGUAL | 3 refills | Status: DC | PRN

## 2017-03-15 MED ORDER — DOXYCYCLINE HYCLATE 100 MG PO TABS: 100.0000 mg | ORAL_TABLET | Freq: Two times a day (BID) | ORAL | 0 refills | Status: DC

## 2017-03-15 NOTE — Telephone Encounter (Signed)
**Note De-Identified  Obfuscation** Patient contacted regarding discharge from [facility] on 03/14/17.  Patient understands to follow up with provider Boyce Medici, PA-c on 03/22/17 at 9:30 at [facility]. Patient understands discharge instructions? Yes Patient understands medications and regiment? Yes Patient understands to bring all medications to this visit? Yes  The pt states that his NTG, Amlodipine and Plavix RXs were not sent to his pharmacy at his d/c. Per his request I have sent those RXs to [facility] to fill.

## 2017-03-15 NOTE — Telephone Encounter (Signed)
lmtcb x2 for [place].

## 2017-03-15 NOTE — Telephone Encounter (Signed)
Spoke with Cherly Hensen and advised message from MR. She verbalized understanding and will pick up medications for pt. Nothing further is needed.

## 2017-03-15 NOTE — Telephone Encounter (Signed)
Chad Moss returning call - she can be reached at [phone] -pr

## 2017-03-20 ENCOUNTER — Telehealth (HOSPITAL_COMMUNITY): Payer: Self-pay

## 2017-03-20 NOTE — Telephone Encounter (Signed)
Patient insurance is active and benefits verified. Patient insurance is [facility] Medicare - no co-payment, no deductible, out of pocket $6700/$3.27 has been met, no co-insurance and no pre-authorization. Passport/reference [phone].

## 2017-03-21 NOTE — Progress Notes (Signed)
Cardiology Office Note:    Date:  03/22/2017   ID:  Chad Moss, DOB 08/05/1955, MRN [id number]  PCP:  Chad Chard, MD  Cardiologist:  Dr. Irish Moss  Referring MD: Chad Chard, MD   Chief Complaint  Patient presents with  . Hospitalization Follow-up    S/P stent    History of Present Illness:    Chad Moss is a 61 y.o. male with a past medical history significant for COPD, current smoker, Crohn's disease, hyperlipidemia, hypertension, IBS, paranoid schizophrenia, PVD, CVA affecting the left eye. The patient had a chest CT and was found to have coronary calcification. He was referred to Dr Chad Moss for further cardiac evaluation. The patient admitted to some typical and atypical chest discomfort. He also had shortness of breath with exertion. A cardiac catheterization was arranged and was accomplished on 03/13/2017. The LHC revealed a 75% stenosis in the mid left circumflex followed by successful PCI/DES. The patient did well through the procedure. He worked with cardiac rehabilitation without any further chest pain. Blood pressures were elevated and amlodipine 5 mg daily was added at discharge. Given his history of stroke and now CAD statin therapy was intensified, increasing atorvastatin to 40 mg daily. The patient was counseled on smoking cessation. Plan is for dual antiplatelet therapy for at least 6 months since PCI done and non-ACS setting.  He is here today with his daughter for post hospital follow-up. He denies chest pain, shortness of breath, orthopnea, PND, edema, or palpitations. He does occasionally have some lightheadedness/dizziness upon first standing and twice had some dizziness with walking. He reports compliance with his medications. His daughter sets out all of his medications and these were reviewed with her. The patient has been given instructions for heart healthy diet but he and his daughter state that they have not started this yet. He continues to smoke. He  is trying to walk for exercise several times per week with his daughter. His weight is actually down from his discharge weight. States that he is eating well and staying hydrated.  Past Medical History:  Diagnosis Date  . Allergic rhinitis   . Anxiety   . Atelectasis   . CAD (coronary artery disease), native coronary artery    9/18 PCI/DES x1 to mLCx, mild diffuse nonobstructive disease, EF 55% on Lv gram  . Chronic bronchitis ([place])   . COPD (chronic obstructive pulmonary disease) ([place])   . Crohn's disease ([place])   . Depression   . DVT (deep venous thrombosis) ([facility])    RLE X 2  . Dysphagia   . Dyspnea   . Enlarged prostate   . GERD (gastroesophageal reflux disease)   . Glaucoma, both eyes   . Heart murmur   . History of stomach ulcers 1980s  . Hyperlipidemia   . Hypertension    "off RX for years now cause of coughing w/Lisinopril" (03/13/2017)  . IBS (irritable bowel syndrome)   . Paranoid schizophrenia ([place])   . Peripheral vascular disease ([place])   . Pneumonia 1990s  . Pre-diabetes    "one time" (03/13/2017)  . Stroke [name]) 04/2016   "eye stroke; left eye" (03/13/2017)  . Syncopal episodes     Past Surgical History:  Procedure Laterality Date  . ABDOMINAL HERNIA REPAIR  1990s  . COLECTOMY  1985; ?1989; 1996   "part of my ileum removed"  . CORONARY ANGIOPLASTY WITH STENT PLACEMENT  03/13/2017  . CORONARY STENT INTERVENTION N/A 03/13/2017   Procedure: CORONARY STENT INTERVENTION;  Surgeon:  Jettie Booze, MD;  Location: [facility];  Service: Cardiovascular;  Laterality: N/A;  . EYE SURGERY Right    "laser; for glaucoma" (03/13/2017)  . FASCIOTOMY Right   . HERNIA REPAIR    . LAPAROSCOPIC CHOLECYSTECTOMY    . LEFT HEART CATH AND CORONARY ANGIOGRAPHY N/A 03/13/2017   Procedure: LEFT HEART CATH AND CORONARY ANGIOGRAPHY;  Surgeon: Jettie Booze, MD;  Location: [facility];  Service: Cardiovascular;  Laterality: N/A;  . PENILE PROSTHESIS IMPLANT   01/2011   Archie Endo 02/01/2011  . PERIPHERAL VASCULAR CATHETERIZATION Right 1999   "had blood clots in my leg; went in to open them up; dye went into leg; ended up having a fasiotomy"  . TONSILLECTOMY      Current Medications: Current Meds  Medication Sig  . albuterol (PROVENTIL) (2.5 MG/3ML) 0.083% nebulizer solution Take 3 mLs (2.5 mg total) by nebulization every 6 (six) hours as needed for wheezing or shortness of breath.  . alfuzosin (UROXATRAL) 10 MG 24 hr tablet Take 10 mg by mouth at bedtime.   Marland Kitchen amLODipine (NORVASC) 5 MG tablet Take 1 tablet (5 mg total) by mouth daily.  Marland Kitchen aspirin EC 81 MG tablet Take 81 mg by mouth daily.  Marland Kitchen atorvastatin (LIPITOR) 40 MG tablet Take 1 tablet (40 mg total) by mouth daily at 6 PM.  . bimatoprost (LUMIGAN) 0.01 % SOLN Place 1 drop into the left eye at bedtime.  . brimonidine (ALPHAGAN) 0.15 % ophthalmic solution Place 1 drop into the left eye 2 (two) times daily.   . cholestyramine (QUESTRAN) 4 G packet Take 1 packet by mouth daily.   . clonazePAM (KLONOPIN) 1 MG tablet Take 1 mg by mouth at bedtime. Anxiety  . clopidogrel (PLAVIX) 75 MG tablet Take 1 tablet (75 mg total) by mouth daily with breakfast.  . dicyclomine (BENTYL) 10 MG capsule Take 10 mg by mouth 4 (four) times daily -  before meals and at bedtime.   . diphenhydrAMINE (BENADRYL) 25 MG tablet Take 50 mg by mouth at bedtime.   . finasteride (PROSCAR) 5 MG tablet Take 5 mg by mouth daily.  . hydrocortisone cream 0.5 % Apply 1 application topically 2 (two) times daily.  Marland Kitchen loxapine (LOXITANE) 10 MG capsule Take 10 mg by mouth 2 (two) times daily. 1 tab in AM and 2 tabs in PM  . mercaptopurine (PURINETHOL) 50 MG tablet Take 75 mg by mouth daily. 1 tablet and a half tablet Give on an empty stomach 1 hour before or 2 hours after meals. Caution: Chemotherapy.  . mesalamine (PENTASA) 250 MG CR capsule Take 1,000 mg by mouth 3 (three) times daily.   . mirabegron ER (MYRBETRIQ) 50 MG TB24 tablet Take 50  mg by mouth daily.  . mirtazapine (REMERON) 30 MG tablet Take 30 mg by mouth at bedtime.   . Nicotine 21-14-7 MG/24HR KIT Apply 1 patch topically as directed.  . nitroGLYCERIN (NITROSTAT) 0.4 MG SL tablet DISSOLVE 1 TABLET UNDER THE TONGUE EVERY 5 MINUTES AS NEEDED  . ondansetron (ZOFRAN) 8 MG tablet Take 8 mg by mouth 2 (two) times daily.  . pantoprazole (PROTONIX) 40 MG tablet Take 1 tablet (40 mg total) by mouth daily.  . potassium chloride (K-DUR) 10 MEQ tablet Take 10 mEq by mouth 2 (two) times daily.  Marland Kitchen PROAIR HFA 108 (90 Base) MCG/ACT inhaler INHALE 2 PUFFS INTO THE LUNGS EVERY 6 HOURS AS NEEDED FOR WHEEZING OR SHORTNESS OF BREATH  . SPIRIVA HANDIHALER  18 MCG inhalation capsule INHALE THE CONTENT OF 1 CAPSULE VIA HANDIHALER DAILY  . timolol (TIMOPTIC) 0.5 % ophthalmic solution Administer 1 drop into the left eye Two (2) times a day.  . traZODone (DESYREL) 50 MG tablet Take 150 mg by mouth at bedtime.  . [DISCONTINUED] doxycycline (VIBRA-TABS) 100 MG tablet Take 1 tablet (100 mg total) by mouth 2 (two) times daily.  . [DISCONTINUED] predniSONE (DELTASONE) 10 MG tablet Take 4 tablets x1 day, then 3 tablets x1 day, then 2 tablets x1 day, then 1 tablet x1 day, and then 0.5 tab x1 day and stop     Allergies:   Benztropine; Codeine; Meperidine; Cyclobenzaprine; Penicillins; Sulfamethoxazole-trimethoprim; and Sulfonamide derivatives   Social History   Social History  . Marital status: Single    Spouse name: N/A  . Number of children: N/A  . Years of education: N/A   Occupational History  . disabled    Social History Main Topics  . Smoking status: Current Every Day Smoker    Packs/day: 1.00    Years: 47.00    Types: Cigarettes  . Smokeless tobacco: Never Used  . Alcohol use No  . Drug use: No  . Sexual activity: No   Other Topics Concern  . None   Social History Narrative  . None     Family History: The patient's family history includes Heart disease in his father and  mother; Hypertension in his father and mother; Lung cancer in his brother. ROS:   Please see the history of present illness.     All other systems reviewed and are negative.  EKGs/Labs/Other Studies Reviewed:    The following studies were reviewed today:  Cardiac catheterization 03/13/2017 Cath: 03/13/17  Conclusion    Prox RCA lesion, 25 %stenosed.  Mid LAD lesion, 25 %stenosed.  Ost 2nd Diag to 2nd Diag lesion, 25 %stenosed.  The left ventricular systolic function is normal.  LV end diastolic pressure is normal.  The left ventricular ejection fraction is 55-65% by visual estimate.  There is no aortic valve stenosis.  Mid Cx lesion, 75 %stenosed.  A STENT RESOLUTE ONYX [id number] drug eluting stent was successfully placed.  Post intervention, there is a 0% residual stenosis.  Unable to use right radial artery due to high bifurcation of radial and ulnar arteries.  He will need dual antiplatelet therapy for 6-12 months, along with aggressive secondary prevention including smoking cessation.       EKG:  EKG is  ordered today.  The ekg ordered today demonstrates normal sinus rhythm at 93 bpm.  Recent Labs: 03/14/2017: BUN 5; Creatinine, Ser 1.04; Hemoglobin 13.5; Platelets 196; Potassium 3.7; Sodium 135   Recent Lipid Panel No results found for: CHOL, TRIG, HDL, CHOLHDL, VLDL, LDLCALC, LDLDIRECT  Physical Exam:    VS:  BP 110/80 (BP Location: Left Arm, Patient Position: Sitting, Cuff Size: Normal)   Pulse 99   Ht '5\' 11"'  (1.803 m)   Wt 200 lb (90.7 kg)   SpO2 97%   BMI 27.89 kg/m     Wt Readings from Last 3 Encounters:  03/22/17 200 lb (90.7 kg)  03/14/17 209 lb 7 oz (95 kg)  02/28/17 205 lb (93 kg)     Physical Exam  Constitutional: He is oriented to person, place, and time. He appears well-developed and well-nourished. No distress.  HENT:  Head: Normocephalic and atraumatic.  Neck: Normal range of motion. Neck supple. No JVD present.  Cardiovascular:  Normal rate, regular rhythm and normal heart  sounds.  Exam reveals no gallop and no friction rub.   No murmur heard. Pulmonary/Chest: Effort normal and breath sounds normal. No respiratory distress. He has no wheezes. He has no rales.  Abdominal: Soft. Bowel sounds are normal. He exhibits no distension. There is no tenderness.  Musculoskeletal: Normal range of motion. He exhibits no edema or deformity.  Neurological: He is alert and oriented to person, place, and time.  Skin: Skin is warm and dry.  Psychiatric: He has a normal mood and affect. His behavior is normal. Thought content normal.   Right radial cath site Is mildly tender but improving, no hematoma, redness or drainage  ASSESSMENT:    1. Coronary artery disease involving native coronary artery of native heart with angina pectoris ([place])   2. Essential hypertension   3. Mixed hyperlipidemia   4. Tobacco abuse    PLAN:    In order of problems listed above:  CAD: Status post stent to the left circumflex on 03/13/17. Patient is having no further chest pain or shortness of breath. He has been compliant with aspirin 81 mg and Plavix 75 mg which are to be continued for 6-12 months post stent. He is taking statin. He is walking with his daughter for exercise. He continues to smoke and we had a long discussion on smoking cessation. We also had a discussion a heart healthy diet and he plans to work on incorporating more fruits and vegetables.  Hypertension: Amlodipine was added at Hospital for elevated blood pressure. Currently blood pressure is well controlled. The patient has occasional lightheadedness upon standing and occasionally when he is out walking. Patient encouraged to maintain adequate fluid intake and rise slowly. The daughter will check his blood pressure at home and if she gets readings with SBP less than 100 she will call the office and we may consider stopping the amlodipine. (The patient has a history of cough in the past  related to ACE inhibitor)  Hyperlipidemia: Statin was increased in the hospital with atorvastatin 10 mg to 40 mg given CAD and history of stroke. The patient will need a fasting lipid panel and LFTs in 6-12 weeks. We will arrange to be done in December.  Tobacco use: He continues to smoke and we had a long discussion on smoking cessation. He cannot afford nicotine patches and we discussed other behavioral interventions. He states that he is motivated to quit and will set a quit date.  Medication Adjustments/Labs and Tests Ordered: Current medicines are reviewed at length with the patient today.  Concerns regarding medicines are outlined above. Labs and tests ordered and medication changes are outlined in the patient instructions below:  Patient Instructions  Medication Instructions:  Your physician recommends that you continue on your current medications as directed. Please refer to the Current Medication list given to you today.   Labwork: 8 WEEKS:  FASTING LIPID  Testing/Procedures: None ordered  Follow-Up: Your physician recommends that you schedule a follow-up appointment in: 3-4 MONTHS WITH DR. VARANASI  Any Other Special Instructions Will Be Listed Below (If Applicable).   If you need a refill on your cardiac medications before your next appointment, please call your pharmacy.      Signed, Daune Perch, NP  03/22/2017 12:47 PM    [facility]

## 2017-03-22 ENCOUNTER — Encounter: Payer: Self-pay | Admitting: Cardiology

## 2017-03-22 ENCOUNTER — Ambulatory Visit (INDEPENDENT_AMBULATORY_CARE_PROVIDER_SITE_OTHER): Payer: Medicare Other | Admitting: Cardiology

## 2017-03-22 VITALS — BP 110/80 | HR 99 | Ht 71.0 in | Wt 200.0 lb

## 2017-03-22 DIAGNOSIS — J449 Chronic obstructive pulmonary disease, unspecified: Secondary | ICD-10-CM | POA: Diagnosis not present

## 2017-03-22 DIAGNOSIS — Z720 Tobacco use: Secondary | ICD-10-CM | POA: Diagnosis not present

## 2017-03-22 DIAGNOSIS — I10 Essential (primary) hypertension: Secondary | ICD-10-CM

## 2017-03-22 DIAGNOSIS — E782 Mixed hyperlipidemia: Secondary | ICD-10-CM

## 2017-03-22 DIAGNOSIS — R7309 Other abnormal glucose: Secondary | ICD-10-CM | POA: Diagnosis not present

## 2017-03-22 DIAGNOSIS — H409 Unspecified glaucoma: Secondary | ICD-10-CM | POA: Diagnosis not present

## 2017-03-22 DIAGNOSIS — Z0000 Encounter for general adult medical examination without abnormal findings: Secondary | ICD-10-CM | POA: Diagnosis not present

## 2017-03-22 DIAGNOSIS — E559 Vitamin D deficiency, unspecified: Secondary | ICD-10-CM | POA: Diagnosis not present

## 2017-03-22 DIAGNOSIS — I25119 Atherosclerotic heart disease of native coronary artery with unspecified angina pectoris: Secondary | ICD-10-CM | POA: Diagnosis not present

## 2017-03-22 DIAGNOSIS — I739 Peripheral vascular disease, unspecified: Secondary | ICD-10-CM | POA: Diagnosis not present

## 2017-03-22 DIAGNOSIS — Z23 Encounter for immunization: Secondary | ICD-10-CM | POA: Diagnosis not present

## 2017-03-22 NOTE — Patient Instructions (Addendum)
Medication Instructions:  Your physician recommends that you continue on your current medications as directed. Please refer to the Current Medication list given to you today.   Labwork: 8 WEEKS:  FASTING LIPID  Testing/Procedures: None ordered  Follow-Up: Your physician recommends that you schedule a follow-up appointment in: 3-4 MONTHS WITH DR. VARANASI  Any Other Special Instructions Will Be Listed Below (If Applicable).   If you need a refill on your cardiac medications before your next appointment, please call your pharmacy.

## 2017-03-23 DIAGNOSIS — K50819 Crohn's disease of both small and large intestine with unspecified complications: Secondary | ICD-10-CM | POA: Diagnosis not present

## 2017-03-27 ENCOUNTER — Ambulatory Visit: Payer: Medicare Other | Admitting: Physician Assistant

## 2017-04-04 ENCOUNTER — Other Ambulatory Visit: Payer: Self-pay | Admitting: Internal Medicine

## 2017-04-12 ENCOUNTER — Telehealth (HOSPITAL_COMMUNITY): Payer: Self-pay

## 2017-04-16 ENCOUNTER — Telehealth (HOSPITAL_COMMUNITY): Payer: Self-pay

## 2017-04-16 NOTE — Telephone Encounter (Signed)
UPDATED: Patient insurance is active and benefits verified with [employer] - No co-payment, no deductible, out of pocket amount is $6,700/$6.54 has been met, no co-insurance, and no pre-authorization is required. Passport/reference [id number]

## 2017-04-17 ENCOUNTER — Telehealth: Payer: Self-pay | Admitting: *Deleted

## 2017-04-17 ENCOUNTER — Encounter (HOSPITAL_COMMUNITY)
Admission: RE | Admit: 2017-04-17 | Discharge: 2017-04-17 | Disposition: A | Discharge status: Home / Self Care | Payer: Medicare Other | Source: Ambulatory Visit | Attending: Interventional Cardiology | Admitting: Interventional Cardiology

## 2017-04-17 ENCOUNTER — Encounter (HOSPITAL_COMMUNITY): Payer: Self-pay

## 2017-04-17 VITALS — BP 122/80 | HR 90 | Ht 70.0 in | Wt 201.9 lb

## 2017-04-17 DIAGNOSIS — Z955 Presence of coronary angioplasty implant and graft: Secondary | ICD-10-CM

## 2017-04-17 NOTE — Progress Notes (Signed)
Upon reviewing Chad Moss's health history he revealed that he took 2 sublingual nitroglycerine yesterday for chest discomfort he rated a 3-4  On a 1-10 scale. Chad Moss did not have any chest pain today during orientation. Chad Moss called and notified. Chad Moss said will call the office and have Chad Moss to get an appointment for evaluation in the near future.Chad LighterMaria Zakariya Knickerbocker, RN,BSN 04/17/2017 4:09 PM

## 2017-04-17 NOTE — Telephone Encounter (Signed)
-----   Message from Berton BonJanine Hammond, NP sent at 04/17/2017  3:56 PM EDT ----- Regarding: chest pain Cardiac rehab called me to report that this pt has had chest pain relieved with NTG on at least 2 occasions in the last few days. They would like for him to be seen and assessed prior to starting to exercise. Can you call him and arrange for him to be seen in the next few days please. He is a pt of Dr. Eldridge DaceVaranasi.   Thanks, Lizabeth LeydenNina Hammond, NP

## 2017-04-17 NOTE — Progress Notes (Signed)
Cardiac Individual Treatment Plan  Patient Details  Name: Chad Moss MRN: [id number] Date of Birth: 04-27-1956 Referring Provider:     [facility] from 04/17/2017 in [place]  Referring Provider  Larae Grooms, MD.      Initial Encounter Date:    CARDIAC REHAB PHASE II ORIENTATION from 04/17/2017 in [place]  Date  04/17/17  Referring Provider  Larae Grooms, MD.      Visit Diagnosis: Status post coronary artery stent placement 03/13/2017 s/p DES LCX  Patient's Home Medications on Admission:  Current Outpatient Prescriptions:  .  albuterol (PROVENTIL) (2.5 MG/3ML) 0.083% nebulizer solution, Take 3 mLs (2.5 mg total) by nebulization every 6 (six) hours as needed for wheezing or shortness of breath., Disp: 120 mL, Rfl: 12 .  alfuzosin (UROXATRAL) 10 MG 24 hr tablet, Take 10 mg by mouth at bedtime. , Disp: , Rfl:  .  amLODipine (NORVASC) 5 MG tablet, Take 1 tablet (5 mg total) by mouth daily., Disp: 90 tablet, Rfl: 1 .  aspirin EC 81 MG tablet, Take 81 mg by mouth daily., Disp: , Rfl:  .  atorvastatin (LIPITOR) 40 MG tablet, Take 1 tablet (40 mg total) by mouth daily at 6 PM., Disp: 90 tablet, Rfl: 1 .  bimatoprost (LUMIGAN) 0.01 % SOLN, Place 1 drop into the left eye at bedtime., Disp: , Rfl:  .  brimonidine (ALPHAGAN) 0.15 % ophthalmic solution, Place 1 drop into the left eye 2 (two) times daily. , Disp: , Rfl:  .  cholestyramine (QUESTRAN) 4 G packet, Take 1 packet by mouth daily. , Disp: , Rfl:  .  clonazePAM (KLONOPIN) 1 MG tablet, Take 1 mg by mouth at bedtime. Anxiety, Disp: , Rfl:  .  clopidogrel (PLAVIX) 75 MG tablet, Take 1 tablet (75 mg total) by mouth daily with breakfast., Disp: 90 tablet, Rfl: 1 .  dicyclomine (BENTYL) 10 MG capsule, Take 10 mg by mouth 4 (four) times daily -  before meals and at bedtime. , Disp: , Rfl:  .  diphenhydrAMINE (BENADRYL) 25 MG tablet, Take  50 mg by mouth at bedtime. , Disp: , Rfl:  .  finasteride (PROSCAR) 5 MG tablet, Take 5 mg by mouth daily., Disp: , Rfl:  .  loxapine (LOXITANE) 10 MG capsule, Take 10 mg by mouth 2 (two) times daily. 1 tab in AM and 2 tabs in PM, Disp: , Rfl:  .  mercaptopurine (PURINETHOL) 50 MG tablet, Take 75 mg by mouth daily. 1 tablet and a half tablet Give on an empty stomach 1 hour before or 2 hours after meals. Caution: Chemotherapy., Disp: , Rfl:  .  mesalamine (PENTASA) 250 MG CR capsule, Take 1,000 mg by mouth 4 (four) times daily. , Disp: , Rfl:  .  mirabegron ER (MYRBETRIQ) 50 MG TB24 tablet, Take 50 mg by mouth daily., Disp: , Rfl:  .  mirtazapine (REMERON) 30 MG tablet, Take 30 mg by mouth at bedtime. , Disp: , Rfl:  .  nitroGLYCERIN (NITROSTAT) 0.4 MG SL tablet, DISSOLVE 1 TABLET UNDER THE TONGUE EVERY 5 MINUTES AS NEEDED, Disp: 75 tablet, Rfl: 0 .  ondansetron (ZOFRAN) 8 MG tablet, Take 8 mg by mouth 2 (two) times daily., Disp: , Rfl:  .  pantoprazole (PROTONIX) 40 MG tablet, Take 1 tablet (40 mg total) by mouth daily., Disp: 30 tablet, Rfl: 1 .  potassium chloride (K-DUR) 10 MEQ tablet, Take 10 mEq by mouth  2 (two) times daily., Disp: , Rfl:  .  PROAIR HFA 108 (90 Base) MCG/ACT inhaler, INHALE 2 PUFFS BY MOUTH INTO THE LUNGS EVERY 6 HOURS AS NEEDED FOR WHEEZING OR SHORTNESS OF BREATH, Disp: 8.5 g, Rfl: 0 .  SPIRIVA HANDIHALER 18 MCG inhalation capsule, INHALE THE CONTENT OF 1 CAPSULE VIA HANDIHALER DAILY, Disp: 30 capsule, Rfl: 0 .  timolol (TIMOPTIC) 0.5 % ophthalmic solution, Administer 1 drop into the left eye Two (2) times a day., Disp: , Rfl:  .  traZODone (DESYREL) 50 MG tablet, Take 150 mg by mouth at bedtime., Disp: , Rfl:  .  hydrocortisone cream 0.5 %, Apply 1 application topically 2 (two) times daily., Disp: , Rfl:  .  Nicotine 21-14-7 MG/24HR KIT, Apply 1 patch topically as directed. (Patient not taking: Reported on 04/17/2017), Disp: 1 each, Rfl: 0  Past Medical History: Past  Medical History:  Diagnosis Date  . Allergic rhinitis   . Anxiety   . Atelectasis   . CAD (coronary artery disease), native coronary artery    9/18 PCI/DES x1 to mLCx, mild diffuse nonobstructive disease, EF 55% on Lv gram  . Chronic bronchitis ([place])   . COPD (chronic obstructive pulmonary disease) ([place])   . Crohn's disease ([place])   . Depression   . DVT (deep venous thrombosis) ([facility])    RLE X 2  . Dysphagia   . Dyspnea   . Enlarged prostate   . GERD (gastroesophageal reflux disease)   . Glaucoma, both eyes   . Heart murmur   . History of stomach ulcers 1980s  . Hyperlipidemia   . Hypertension    "off RX for years now cause of coughing w/Lisinopril" (03/13/2017)  . IBS (irritable bowel syndrome)   . Paranoid schizophrenia ([place])   . Peripheral vascular disease ([place])   . Pneumonia 1990s  . Pre-diabetes    "one time" (03/13/2017)  . Stroke [facility]) 04/2016   "eye stroke; left eye" (03/13/2017)  . Syncopal episodes     Tobacco Use: History  Smoking Status  . Current Every Day Smoker  . Packs/day: 1.00  . Years: 47.00  . Types: Cigarettes  Smokeless Tobacco  . Never Used    Comment: Patient given smoking cessation information    Labs: Recent Review Flowsheet Data    There is no flowsheet data to display.      Capillary Blood Glucose: No results found for: GLUCAP   Exercise Target Goals: Date: 04/17/17  Exercise Program Goal: Individual exercise prescription set with THRR, safety & activity barriers. Participant demonstrates ability to understand and report RPE using BORG scale, to self-measure pulse accurately, and to acknowledge the importance of the exercise prescription.  Exercise Prescription Goal: Starting with aerobic activity 30 plus minutes a day, 3 days per week for initial exercise prescription. Provide home exercise prescription and guidelines that participant acknowledges understanding prior to discharge.  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   Cardiac Risk Stratification Moderate      6 Minute Walk:     6 Minute Walk    Row Name 04/17/17 1508         6 Minute Walk   Phase Initial     Distance 1366 feet     Walk Time 6 minutes     #  of Rest Breaks 0     MPH 2.59     METS 3.62     RPE 8     VO2 Peak 12.65     Symptoms No     Resting HR 90 bpm     Resting BP 122/80     Resting Oxygen Saturation  98 %     Exercise Oxygen Saturation  during 6 min walk 98 %     Max Ex. HR 117 bpm     Max Ex. BP 142/80     2 Minute Post BP 124/60        Oxygen Initial Assessment:   Oxygen Re-Evaluation:   Oxygen Discharge (Final Oxygen Re-Evaluation):   Initial Exercise Prescription:     Initial Exercise Prescription - 04/17/17 1500      Date of Initial Exercise RX and Referring Provider   Date 04/17/17   Referring Provider Larae Grooms, MD.     Bike   Level 1.3   Minutes 10   METs 3.65     NuStep   Level 3   SPM 80   Minutes 10   METs 2.5     Track   Laps 11   Minutes 10   METs 2.92     Prescription Details   Frequency (times per week) 3   Duration Progress to 30 minutes of continuous aerobic without signs/symptoms of physical distress     Intensity   THRR 40-80% of Max Heartrate 63-126   Ratings of Perceived Exertion 11-13   Perceived Dyspnea 0-4     Progression   Progression Continue to progress workloads to maintain intensity without signs/symptoms of physical distress.     Resistance Training   Training Prescription Yes   Weight 3lbs   Reps 10-15      Perform Capillary Blood Glucose checks as needed.  Exercise Prescription Changes:   Exercise Comments:   Exercise Goals and Review:     Exercise Goals    Row Name 04/17/17 1510             Exercise Goals   Increase  Physical Activity Yes       Intervention Provide advice, education, support and counseling about physical activity/exercise needs.;Develop an individualized exercise prescription for aerobic and resistive training based on initial evaluation findings, risk stratification, comorbidities and participant's personal goals.       Expected Outcomes Achievement of increased cardiorespiratory fitness and enhanced flexibility, muscular endurance and strength shown through measurements of functional capacity and personal statement of participant.       Increase Strength and Stamina Yes       Intervention Provide advice, education, support and counseling about physical activity/exercise needs.;Develop an individualized exercise prescription for aerobic and resistive training based on initial evaluation findings, risk stratification, comorbidities and participant's personal goals.       Expected Outcomes Achievement of increased cardiorespiratory fitness and enhanced flexibility, muscular endurance and strength shown through measurements of functional capacity and personal statement of participant.       Able to understand and use rate of perceived exertion (RPE) scale Yes       Intervention Provide education and explanation on how to use RPE scale       Expected Outcomes Short Term: Able to use RPE daily in rehab to express subjective intensity level;Long Term:  Able to use RPE to guide intensity level when exercising independently       Knowledge and understanding of Target Heart Rate Range (  THRR) Yes       Intervention Provide education and explanation of THRR including how the numbers were predicted and where they are located for reference       Expected Outcomes Short Term: Able to state/look up THRR;Short Term: Able to use daily as guideline for intensity in rehab;Long Term: Able to use THRR to govern intensity when exercising independently       Able to check pulse independently Yes       Intervention  Provide education and demonstration on how to check pulse in carotid and radial arteries.;Review the importance of being able to check your own pulse for safety during independent exercise       Expected Outcomes Short Term: Able to explain why pulse checking is important during independent exercise;Long Term: Able to check pulse independently and accurately       Understanding of Exercise Prescription Yes       Intervention Provide education, explanation, and written materials on patient's individual exercise prescription       Expected Outcomes Short Term: Able to explain program exercise prescription;Long Term: Able to explain home exercise prescription to exercise independently          Exercise Goals Re-Evaluation :    Discharge Exercise Prescription (Final Exercise Prescription Changes):   Nutrition:  Target Goals: Understanding of nutrition guidelines, daily intake of sodium <1574m, cholesterol <2056m calories 30% from fat and 7% or less from saturated fats, daily to have 5 or more servings of fruits and vegetables.  Biometrics:     Pre Biometrics - 04/17/17 1559      Pre Biometrics   Height '5\' 10"'  (1.778 m)   Weight 201 lb 15.1 oz (91.6 kg)   Waist Circumference 41.5 inches   Hip Circumference 40.25 inches   Waist to Hip Ratio 1.03 %   BMI (Calculated) 28.98   Triceps Skinfold 16 mm   % Body Fat 28.6 %   Grip Strength 37.5 kg   Flexibility 8 in   Single Leg Stand 30 seconds       Nutrition Therapy Plan and Nutrition Goals:   Nutrition Discharge: Nutrition Scores:   Nutrition Goals Re-Evaluation:   Nutrition Goals Re-Evaluation:   Nutrition Goals Discharge (Final Nutrition Goals Re-Evaluation):   Psychosocial: Target Goals: Acknowledge presence or absence of significant depression and/or stress, maximize coping skills, provide positive support system. Participant is able to verbalize types and ability to use techniques and skills needed for reducing  stress and depression.  Initial Review & Psychosocial Screening:     Initial Psych Review & Screening - 04/17/17 1613      Initial Review   Current issues with History of Depression;Current Depression     Family Dynamics   Good Support System? Yes  Patient has personal caregiver that lives with him for support     Barriers   Psychosocial barriers to participate in program The patient should benefit from training in stress management and relaxation.     Screening Interventions   Interventions To provide support and resources with identified psychosocial needs;Encouraged to exercise;Other (comment)   Comments Patiient has a history of paranoid shizophrenia [place] Life Scores:     Quality of Life - 04/17/17 1618      Quality of Life Scores   GLOBAL Pre --  Reviewed with patient and caregiver      PHQ-9: Recent Review Flowsheet Data    There is  no flowsheet data to display.     Interpretation of Total Score  Total Score Depression Severity:  1-4 = Minimal depression, 5-9 = Mild depression, 10-14 = Moderate depression, 15-19 = Moderately severe depression, 20-27 = Severe depression   Psychosocial Evaluation and Intervention:   Psychosocial Re-Evaluation:   Psychosocial Discharge (Final Psychosocial Re-Evaluation):   Vocational Rehabilitation: Provide vocational rehab assistance to qualifying candidates.   Vocational Rehab Evaluation & Intervention:     Vocational Rehab - 04/17/17 1446      Initial Vocational Rehab Evaluation & Intervention   Assessment shows need for Vocational Rehabilitation No  disabled       Education: Education Goals: Education classes will be provided on a weekly basis, covering required topics. Participant will state understanding/return demonstration of topics presented.  Learning Barriers/Preferences:     Learning Barriers/Preferences - 04/17/17 1437      Learning  Barriers/Preferences   Learning Barriers Sight   Learning Preferences Audio;Skilled Demonstration;Video      Education Topics: Count Your Pulse:  -Group instruction provided by verbal instruction, demonstration, patient participation and written materials to support subject.  Instructors address importance of being able to find your pulse and how to count your pulse when at home without a heart monitor.  Patients get hands on experience counting their pulse with staff help and individually.   Heart Attack, Angina, and Risk Factor Modification:  -Group instruction provided by verbal instruction, video, and written materials to support subject.  Instructors address signs and symptoms of angina and heart attacks.    Also discuss risk factors for heart disease and how to make changes to improve heart health risk factors.   Functional Fitness:  -Group instruction provided by verbal instruction, demonstration, patient participation, and written materials to support subject.  Instructors address safety measures for doing things around the house.  Discuss how to get up and down off the floor, how to pick things up properly, how to safely get out of a chair without assistance, and balance training.   Meditation and Mindfulness:  -Group instruction provided by verbal instruction, patient participation, and written materials to support subject.  Instructor addresses importance of mindfulness and meditation practice to help reduce stress and improve awareness.  Instructor also leads participants through a meditation exercise.    Stretching for Flexibility and Mobility:  -Group instruction provided by verbal instruction, patient participation, and written materials to support subject.  Instructors lead participants through series of stretches that are designed to increase flexibility thus improving mobility.  These stretches are additional exercise for major muscle groups that are typically performed  during regular warm up and cool down.   Hands Only CPR:  -Group verbal, video, and participation provides a basic overview of AHA guidelines for community CPR. Role-play of emergencies allow participants the opportunity to practice calling for help and chest compression technique with discussion of AED use.   Hypertension: -Group verbal and written instruction that provides a basic overview of hypertension including the most recent diagnostic guidelines, risk factor reduction with self-care instructions and medication management.    Nutrition I class: Heart Healthy Eating:  -Group instruction provided by PowerPoint slides, verbal discussion, and written materials to support subject matter. The instructor gives an explanation and review of the Therapeutic Lifestyle Changes diet recommendations, which includes a discussion on lipid goals, dietary fat, sodium, fiber, plant stanol/sterol esters, sugar, and the components of a well-balanced, healthy diet.   Nutrition II class: Lifestyle Skills:  -Group instruction provided by PowerPoint  slides, verbal discussion, and written materials to support subject matter. The instructor gives an explanation and review of label reading, grocery shopping for heart health, heart healthy recipe modifications, and ways to make healthier choices when eating out.   Diabetes Question & Answer:  -Group instruction provided by PowerPoint slides, verbal discussion, and written materials to support subject matter. The instructor gives an explanation and review of diabetes co-morbidities, pre- and post-prandial blood glucose goals, pre-exercise blood glucose goals, signs, symptoms, and treatment of hypoglycemia and hyperglycemia, and foot care basics.   Diabetes Blitz:  -Group instruction provided by PowerPoint slides, verbal discussion, and written materials to support subject matter. The instructor gives an explanation and review of the physiology behind type 1 and  type 2 diabetes, diabetes medications and rational behind using different medications, pre- and post-prandial blood glucose recommendations and Hemoglobin A1c goals, diabetes diet, and exercise including blood glucose guidelines for exercising safely.    Portion Distortion:  -Group instruction provided by PowerPoint slides, verbal discussion, written materials, and food models to support subject matter. The instructor gives an explanation of serving size versus portion size, changes in portions sizes over the last 20 years, and what consists of a serving from each food group.   Stress Management:  -Group instruction provided by verbal instruction, video, and written materials to support subject matter.  Instructors review role of stress in heart disease and how to cope with stress positively.     Exercising on Your Own:  -Group instruction provided by verbal instruction, power point, and written materials to support subject.  Instructors discuss benefits of exercise, components of exercise, frequency and intensity of exercise, and end points for exercise.  Also discuss use of nitroglycerin and activating EMS.  Review options of places to exercise outside of rehab.  Review guidelines for sex with heart disease.   Cardiac Drugs I:  -Group instruction provided by verbal instruction and written materials to support subject.  Instructor reviews cardiac drug classes: antiplatelets, anticoagulants, beta blockers, and statins.  Instructor discusses reasons, side effects, and lifestyle considerations for each drug class.   Cardiac Drugs II:  -Group instruction provided by verbal instruction and written materials to support subject.  Instructor reviews cardiac drug classes: angiotensin converting enzyme inhibitors (ACE-I), angiotensin II receptor blockers (ARBs), nitrates, and calcium channel blockers.  Instructor discusses reasons, side effects, and lifestyle considerations for each drug  class.   Anatomy and Physiology of the Circulatory System:  Group verbal and written instruction and models provide basic cardiac anatomy and physiology, with the coronary electrical and arterial systems. Review of: AMI, Angina, Valve disease, Heart Failure, Peripheral Artery Disease, Cardiac Arrhythmia, Pacemakers, and the ICD.   Other Education:  -Group or individual verbal, written, or video instructions that support the educational goals of the cardiac rehab program.   Knowledge Questionnaire Score:     Knowledge Questionnaire Score - 04/17/17 1446      Knowledge Questionnaire Score   Pre Score 23/28      Core Components/Risk Factors/Patient Goals at Admission:     Personal Goals and Risk Factors at Admission - 04/17/17 1612      Core Components/Risk Factors/Patient Goals on Admission    Weight Management Obesity;Weight Maintenance;Weight Loss;Yes   Intervention Weight Management: Develop a combined nutrition and exercise program designed to reach desired caloric intake, while maintaining appropriate intake of nutrient and fiber, sodium and fats, and appropriate energy expenditure required for the weight goal.;Weight Management: Provide education and appropriate resources to help  participant work on and attain dietary goals.;Weight Management/Obesity: Establish reasonable short term and long term weight goals.;Obesity: Provide education and appropriate resources to help participant work on and attain dietary goals.   Expected Outcomes Understanding of distribution of calorie intake throughout the day with the consumption of 4-5 meals/snacks;Understanding recommendations for meals to include 15-35% energy as protein, 25-35% energy from fat, 35-60% energy from carbohydrates, less than 214m of dietary cholesterol, 20-35 gm of total fiber daily;Weight Loss: Understanding of general recommendations for a balanced deficit meal plan, which promotes 1-2 lb weight loss per week and includes  a negative energy balance of [phone] kcal/d;Weight Maintenance: Understanding of the daily nutrition guidelines, which includes 25-35% calories from fat, 7% or less cal from saturated fats, less than 201mcholesterol, less than 1.5gm of sodium, & 5 or more servings of fruits and vegetables daily;Long Term: Adherence to nutrition and physical activity/exercise program aimed toward attainment of established weight goal;Short Term: Continue to assess and modify interventions until short term weight is achieved   Tobacco Cessation Yes   Number of packs per day 1 pack per day   Intervention Assist the participant in steps to quit. Provide individualized education and counseling about committing to Tobacco Cessation, relapse prevention, and pharmacological support that can be provided by physician.;OfAdvice workerassist with locating and accessing local/national Quit Smoking programs, and support quit date choice.   Expected Outcomes Short Term: Will demonstrate readiness to quit, by selecting a quit date.;Short Term: Will quit all tobacco product use, adhering to prevention of relapse plan.;Long Term: Complete abstinence from all tobacco products for at least 12 months from quit date.   Hypertension Yes   Intervention Provide education on lifestyle modifcations including regular physical activity/exercise, weight management, moderate sodium restriction and increased consumption of fresh fruit, vegetables, and low fat dairy, alcohol moderation, and smoking cessation.;Monitor prescription use compliance.   Expected Outcomes Short Term: Continued assessment and intervention until BP is < 140/9089mG in hypertensive participants. < 130/7m59m in hypertensive participants with diabetes, heart failure or chronic kidney disease.;Long Term: Maintenance of blood pressure at goal levels.   Lipids Yes   Intervention Provide education and support for participant on nutrition & aerobic/resistive exercise  along with prescribed medications to achieve LDL <70mg15mL >40mg.68mxpected Outcomes Short Term: Participant states understanding of desired cholesterol values and is compliant with medications prescribed. Participant is following exercise prescription and nutrition guidelines.;Long Term: Cholesterol controlled with medications as prescribed, with individualized exercise RX and with personalized nutrition plan. Value goals: LDL < 70mg, 86m> 40 mg.   Stress Yes   Intervention Offer individual and/or small group education and counseling on adjustment to heart disease, stress management and health-related lifestyle change. Teach and support self-help strategies.;Refer participants experiencing significant psychosocial distress to appropriate mental health specialists for further evaluation and treatment. When possible, include family members and significant others in education/counseling sessions.   Expected Outcomes Short Term: Participant demonstrates changes in health-related behavior, relaxation and other stress management skills, ability to obtain effective social support, and compliance with psychotropic medications if prescribed.;Long Term: Emotional wellbeing is indicated by absence of clinically significant psychosocial distress or social isolation.      Core Components/Risk Factors/Patient Goals Review:    Core Components/Risk Factors/Patient Goals at Discharge (Final Review):    ITP Comments:     ITP Comments    Row Name 04/17/17 1443           ITP Comments Dr. TraciTressia Miners  Turner, Medical Director           Comments: Fritz Pickerel  attended orientation from 1400 to 1600 to review rules and guidelines for program. Completed 6 minute walk test, Intitial ITP, and exercise prescription.  VSS. Telemetry-Sinus Rhythm with a tall t wave this was documented on Mr Rauf' previous 12 lead ECG.  Asymptomatic. Mr Muzquiz told me he had chest pain yesterday and on day last week and took SL  Nitroglycerin on both occasions. Pecolia Ades NP was notified. Daequan has paranoid schizophrenia and says he sees a Teacher, music in [place] every two months. Chancelor also says he is slightly depressed but denies being suicidal. Maynor denies that he would hurt himself or anyone else.Barnet Pall, RN,BSN 04/17/2017 4:37 PM

## 2017-04-17 NOTE — Telephone Encounter (Signed)
I spoke with pt and scheduled him to see Norma Fredrickson, NP tomorrow at 11:30

## 2017-04-17 NOTE — Progress Notes (Signed)
.  Cardiac Rehab Medication Review by a Pharmacist  Does the patient  feel that his/her medications are working for him/her?  yes  Has the patient been experiencing any side effects to the medications prescribed?  no  Does the patient measure his/her own blood pressure or blood glucose at home?  no   Does the patient have any problems obtaining medications due to transportation or finances?   yes  Understanding of regimen: good Understanding of indications: good Potential of compliance: good    Nurse comments: Chad NajjarLarry says he is taking his medications as prescribed except for his nicotine patch. Chad NajjarLarry says he cannot afford the nicotine patch as it is not covered by medicaid.      Chad HeadingsMaria Walden [name] RN BSN 04/17/2017 2:51 PM

## 2017-04-18 ENCOUNTER — Other Ambulatory Visit: Payer: Self-pay | Admitting: Interventional Cardiology

## 2017-04-18 ENCOUNTER — Ambulatory Visit (INDEPENDENT_AMBULATORY_CARE_PROVIDER_SITE_OTHER): Payer: Medicare Other | Admitting: Nurse Practitioner

## 2017-04-18 ENCOUNTER — Encounter: Payer: Self-pay | Admitting: Nurse Practitioner

## 2017-04-18 VITALS — BP 118/64 | HR 87 | Ht 70.0 in | Wt 203.4 lb

## 2017-04-18 DIAGNOSIS — Z720 Tobacco use: Secondary | ICD-10-CM

## 2017-04-18 DIAGNOSIS — I25119 Atherosclerotic heart disease of native coronary artery with unspecified angina pectoris: Secondary | ICD-10-CM

## 2017-04-18 DIAGNOSIS — E782 Mixed hyperlipidemia: Secondary | ICD-10-CM

## 2017-04-18 DIAGNOSIS — I10 Essential (primary) hypertension: Secondary | ICD-10-CM

## 2017-04-18 MED ORDER — ISOSORBIDE MONONITRATE ER 30 MG PO TB24: 30.0000 mg | ORAL_TABLET | Freq: Every day | ORAL | 6 refills | Status: DC

## 2017-04-18 MED ORDER — PANTOPRAZOLE SODIUM 40 MG PO TBEC: 40.0000 mg | DELAYED_RELEASE_TABLET | Freq: Two times a day (BID) | ORAL | 6 refills | Status: DC

## 2017-04-18 NOTE — Progress Notes (Addendum)
CARDIOLOGY OFFICE NOTE  Date:  04/18/2017    Chad Moss Date of Birth: 07/13/1955 Medical Record #[id number]  PCP:  Glendale Chard, MD  Cardiologist:  [facility]    Chief Complaint  Patient presents with  . Coronary Artery Disease  . Chest Pain    Work in visit - seen for Dr. Irish Lack    History of Present Illness: Chad Moss is a 61 y.o. male who presents today for a work in visit. Seen for Dr. Irish Lack.   He has a history significant for COPD, current smoker, Crohn's disease, HLD, HTN,  IBS, paranoid schizophrenia, PVD, & prior CVA affecting the left eye.   He had a chest CT and was found to have coronary calcification. He was referred to Dr Irish Lack for further cardiac evaluation. The patient admitted to some typical and atypical chest discomfort. He also had shortness of breath with exertion. A cardiac catheterization was arranged and was accomplished on 03/13/2017. The LHC revealed a 75% stenosis in the mid left circumflex followed by successful PCI/DES. The patient did well through the procedure. He worked with cardiac rehabilitation without any further chest pain. Blood pressures were elevated and amlodipine 5 mg daily was added at discharge. Given his history of stroke and now CAD statin therapy was intensified, increasing atorvastatin to 40 mg daily. The patient was counseled on smoking cessation. Plan is for dual antiplatelet therapy for at least 6 months since PCI done and non-ACS setting.  Seen by Daune Perch, NP earlier this month for his post hospital visit. Was doing ok. Continued to smoke.   Phone call from yesterday - "Message from Daune Perch, NP sent at 04/17/2017  3:56 PM EDT ----- Regarding: chest pain Cardiac rehab called me to report that this pt has had chest pain relieved with NTG on at least 2 occasions in the last few days. They would like for him to be seen and assessed prior to starting to exercise. Can you call him and arrange for him to  be seen in the next few days please. He is a pt of Dr. Irish Lack."  Thus added to my schedule for today.   Comes in today. Here with his caregiver - Mliss Sax. He notes that he was doing fine up until this past Saturday. He lifted a 27 pound baby - started having chest pain/pressure/tightness - took 3 NTG with relief. Spell lasted about 20 minutes. Did fine on Sunday. Monday had a recurrent spell with walking - took NTG x 2 at that time. Somewhat similar to prior chest pain syndrome. Still smoking. Has done fine today. He wonders if it is indigestion. He is on PPI therapy.   Past Medical History:  Diagnosis Date  . Allergic rhinitis   . Anxiety   . Atelectasis   . CAD (coronary artery disease), native coronary artery    9/18 PCI/DES x1 to mLCx, mild diffuse nonobstructive disease, EF 55% on Lv gram  . Chronic bronchitis ([place])   . COPD (chronic obstructive pulmonary disease) ([place])   . Crohn's disease ([place])   . Depression   . DVT (deep venous thrombosis) ([facility])    RLE X 2  . Dysphagia   . Dyspnea   . Enlarged prostate   . GERD (gastroesophageal reflux disease)   . Glaucoma, both eyes   . Heart murmur   . History of stomach ulcers 1980s  . Hyperlipidemia   . Hypertension    "off RX for years now cause of  coughing w/Lisinopril" (03/13/2017)  . IBS (irritable bowel syndrome)   . Paranoid schizophrenia ([place])   . Peripheral vascular disease ([place])   . Pneumonia 1990s  . Pre-diabetes    "one time" (03/13/2017)  . Stroke [facility]) 04/2016   "eye stroke; left eye" (03/13/2017)  . Syncopal episodes     Past Surgical History:  Procedure Laterality Date  . ABDOMINAL HERNIA REPAIR  1990s  . COLECTOMY  1985; ?1989; 1996   "part of my ileum removed"  . CORONARY ANGIOPLASTY WITH STENT PLACEMENT  03/13/2017  . CORONARY STENT INTERVENTION N/A 03/13/2017   Procedure: CORONARY STENT INTERVENTION;  Surgeon: Jettie Booze, MD;  Location: [place] [facility];  Service: Cardiovascular;  Laterality:  N/A;  . EYE SURGERY Right    "laser; for glaucoma" (03/13/2017)  . FASCIOTOMY Right   . HERNIA REPAIR    . LAPAROSCOPIC CHOLECYSTECTOMY    . LEFT HEART CATH AND CORONARY ANGIOGRAPHY N/A 03/13/2017   Procedure: LEFT HEART CATH AND CORONARY ANGIOGRAPHY;  Surgeon: Jettie Booze, MD;  Location: [place] [facility];  Service: Cardiovascular;  Laterality: N/A;  . PENILE PROSTHESIS IMPLANT  01/2011   Archie Endo 02/01/2011  . PERIPHERAL VASCULAR CATHETERIZATION Right 1999   "had blood clots in my leg; went in to open them up; dye went into leg; ended up having a fasiotomy"  . TONSILLECTOMY       Medications: Current Meds  Medication Sig  . albuterol (PROVENTIL) (2.5 MG/3ML) 0.083% nebulizer solution Take 3 mLs (2.5 mg total) by nebulization every 6 (six) hours as needed for wheezing or shortness of breath.  . alfuzosin (UROXATRAL) 10 MG 24 hr tablet Take 10 mg by mouth at bedtime.   Marland Kitchen amLODipine (NORVASC) 5 MG tablet Take 1 tablet (5 mg total) by mouth daily.  Marland Kitchen aspirin EC 81 MG tablet Take 81 mg by mouth daily.  Marland Kitchen atorvastatin (LIPITOR) 40 MG tablet Take 1 tablet (40 mg total) by mouth daily at 6 PM.  . bimatoprost (LUMIGAN) 0.01 % SOLN Place 1 drop into the left eye at bedtime.  . brimonidine (ALPHAGAN) 0.15 % ophthalmic solution Place 1 drop into the left eye 2 (two) times daily.   . cholestyramine (QUESTRAN) 4 G packet Take 1 packet by mouth daily.   . clonazePAM (KLONOPIN) 1 MG tablet Take 1 mg by mouth at bedtime. Anxiety  . clopidogrel (PLAVIX) 75 MG tablet Take 1 tablet (75 mg total) by mouth daily with breakfast.  . dicyclomine (BENTYL) 10 MG capsule Take 10 mg by mouth 4 (four) times daily -  before meals and at bedtime.   . diphenhydrAMINE (BENADRYL) 25 MG tablet Take 50 mg by mouth at bedtime.   . finasteride (PROSCAR) 5 MG tablet Take 5 mg by mouth daily.  Marland Kitchen loxapine (LOXITANE) 10 MG capsule Take 10 mg by mouth 2 (two) times daily. 1 tab in AM and 2 tabs in PM  . mercaptopurine  (PURINETHOL) 50 MG tablet Take 75 mg by mouth daily. 1 tablet and a half tablet Give on an empty stomach 1 hour before or 2 hours after meals. Caution: Chemotherapy.  . mesalamine (PENTASA) 250 MG CR capsule Take 1,000 mg by mouth 4 (four) times daily.   . mirabegron ER (MYRBETRIQ) 50 MG TB24 tablet Take 50 mg by mouth daily.  . mirtazapine (REMERON) 30 MG tablet Take 30 mg by mouth at bedtime.   . nitroGLYCERIN (NITROSTAT) 0.4 MG SL tablet DISSOLVE 1 TABLET UNDER THE TONGUE EVERY 5  MINUTES AS NEEDED  . ondansetron (ZOFRAN) 8 MG tablet Take 8 mg by mouth 2 (two) times daily.  . pantoprazole (PROTONIX) 40 MG tablet Take 1 tablet (40 mg total) by mouth 2 (two) times daily.  . potassium chloride (K-DUR) 10 MEQ tablet Take 10 mEq by mouth 2 (two) times daily.  Marland Kitchen PROAIR HFA 108 (90 Base) MCG/ACT inhaler INHALE 2 PUFFS BY MOUTH INTO THE LUNGS EVERY 6 HOURS AS NEEDED FOR WHEEZING OR SHORTNESS OF BREATH  . SPIRIVA HANDIHALER 18 MCG inhalation capsule INHALE THE CONTENT OF 1 CAPSULE VIA HANDIHALER DAILY  . timolol (TIMOPTIC) 0.5 % ophthalmic solution Administer 1 drop into the left eye Two (2) times a day.  . traZODone (DESYREL) 50 MG tablet Take 150 mg by mouth at bedtime.  . [DISCONTINUED] hydrocortisone cream 0.5 % Apply 1 application topically 2 (two) times daily.  . [DISCONTINUED] Nicotine 21-14-7 MG/24HR KIT Apply 1 patch topically as directed.  . [DISCONTINUED] pantoprazole (PROTONIX) 40 MG tablet Take 1 tablet (40 mg total) by mouth daily.     Allergies: Allergies  Allergen Reactions  . Benztropine Anaphylaxis  . Codeine Anaphylaxis  . Meperidine Swelling  . Cyclobenzaprine Other (See Comments)    Pt. Does not remember  . Penicillins Rash    Has patient had a PCN reaction causing immediate rash, facial/tongue/throat swelling, SOB or lightheadedness with hypotension: Yes Has patient had a PCN reaction causing severe rash involving mucus membranes or skin necrosis: No Has patient had a PCN  reaction that required hospitalization No Has patient had a PCN reaction occurring within the last 10 years: No If all of the above answers are "NO", then may proceed with Cephalosporin use.   . Sulfamethoxazole-Trimethoprim     REACTION: hives  . Sulfonamide Derivatives     REACTION: hives    Social History: The patient  reports that he has been smoking Cigarettes.  He has a 47.00 pack-year smoking history. He has never used smokeless tobacco. He reports that he does not drink alcohol or use drugs.   Family History: The patient's family history includes Heart disease in his father and mother; Hypertension in his father and mother; Lung cancer in his brother.   Review of Systems: Please see the history of present illness.   Otherwise, the review of systems is positive for none.   All other systems are reviewed and negative.   Physical Exam: VS:  BP 118/64   Pulse 87   Ht '5\' 10"'  (1.778 m)   Wt 203 lb 6.4 oz (92.3 kg)   BMI 29.18 kg/m  .  BMI Body mass index is 29.18 kg/m.  Wt Readings from Last 3 Encounters:  04/18/17 203 lb 6.4 oz (92.3 kg)  04/17/17 201 lb 15.1 oz (91.6 kg)  03/22/17 200 lb (90.7 kg)    General: Pleasant. Smells heavily of smoke. Alert and in no acute distress.   HEENT: Normal.  Neck: Supple, no JVD, carotid bruits, or masses noted.  Cardiac: Regular rate and rhythm. No murmurs, rubs, or gallops. No edema.  Respiratory:  Lungs are clear to auscultation bilaterally with normal work of breathing.  GI: Soft and nontender.  MS: No deformity or atrophy. Gait and ROM intact.  Skin: Warm and dry. Color is normal.  Neuro:  Strength and sensation are intact and no gross focal deficits noted.  Psych: Alert, appropriate and with normal affect.   LABORATORY DATA:  EKG:  EKG is ordered today. This demonstrates NSR - reviewed with  Dr. Marlou Porch (DOD).  Lab Results  Component Value Date   WBC 10.2 03/14/2017   HGB 13.5 03/14/2017   HCT 37.7 (L) 03/14/2017   PLT  196 03/14/2017   GLUCOSE 110 (H) 03/14/2017   ALT 30 02/03/2014   AST 39 (H) 02/03/2014   NA 135 03/14/2017   K 3.7 03/14/2017   CL 102 03/14/2017   CREATININE 1.04 03/14/2017   BUN 5 (L) 03/14/2017   CO2 24 03/14/2017   TSH 0.78 09/02/2013   INR 1.0 02/28/2017     BNP (last 3 results) No results for input(s): BNP in the last 8760 hours.  ProBNP (last 3 results) No results for input(s): PROBNP in the last 8760 hours.   Other Studies Reviewed Today:  Cath: 03/13/17  Conclusion    Prox RCA lesion, 25 %stenosed.  Mid LAD lesion, 25 %stenosed.  Ost 2nd Diag to 2nd Diag lesion, 25 %stenosed.  The left ventricular systolic function is normal.  LV end diastolic pressure is normal.  The left ventricular ejection fraction is 55-65% by visual estimate.  There is no aortic valve stenosis.  Mid Cx lesion, 75 %stenosed.  A STENT RESOLUTE ONYX [id number] drug eluting stent was successfully placed.  Post intervention, there is a 0% residual stenosis.  Unable to use right radial artery due to high bifurcation of radial and ulnar arteries.  He will need dual antiplatelet therapy for 6-12 months, along with aggressive secondary prevention including smoking cessation.       Assessment/Plan:  1. Chest pain - 2 spells - responsive to NTG - one with exertion and one without. Chest pain prior to the PCI hard to discern - discussed with Dr. Marlou Porch (DOD) - will add Imdur 30 mg a day. Increase his PPI - early follow up back here. May need repeat cardiac cath. Reminded that he should call 911 if he has a spell and uses 3 NTG. Would hold on cardiac rehab for now.   2. CAD: Status post stent to the left circumflex on 03/13/17. Very little residual disease.   3. Hypertension: BP ok on current regimen. No changes made otherwise today.   4. HLD - on statin therapy.   5. Tobacco use: Not sure he has the motivation to stop.  6. Psyche disorder - he sees Dr. Eustaquio Boyden  [phone]  Current medicines are reviewed with the patient today.  The patient does not have concerns regarding medicines other than what has been noted above.  The following changes have been made:  See above.  Labs/ tests ordered today include:    Orders Placed This Encounter  Procedures  . EKG 12-Lead     Disposition:   Early follow up back here in 2 weeks. May need cardiac cath.    Patient is agreeable to this plan and will call if any problems develop in the interim.   SignedTruitt Merle, NP  04/18/2017 12:32 PM  [facility] [address] [place], [place]  [address] Phone: [phone] Fax: [phone]

## 2017-04-18 NOTE — Patient Instructions (Addendum)
We will be checking the following labs today - NONE   Medication Instructions:    Continue with your current medicines. BUT  I am adding Imdur 30 mg a day - this is at the drug store  I am increasing the Protonix to 40 mg to take twice a day     Testing/Procedures To Be Arranged:  N/A  Follow-Up:   See in about 2 weeks    Other Special Instructions:   Try to keep working on your smoking    If you need a refill on your cardiac medications before your next appointment, please call your pharmacy.   Call the [facility] office at [phone] if you have any questions, problems or concerns.

## 2017-04-19 NOTE — Progress Notes (Signed)
Chad Moss 61 y.o. male DOB Feb 05, 1956 MRN [id number]       Nutrition: Brief Note  1. Status post coronary artery stent placement 03/13/2017 s/p DES LCX    Past Medical History:  Diagnosis Date  . Allergic rhinitis   . Anxiety   . Atelectasis   . CAD (coronary artery disease), native coronary artery    9/18 PCI/DES x1 to mLCx, mild diffuse nonobstructive disease, EF 55% on Lv gram  . Chronic bronchitis ([facility])   . COPD (chronic obstructive pulmonary disease) ([facility])   . Crohn's disease ([facility])   . Depression   . DVT (deep venous thrombosis) ([facility])    RLE X 2  . Dysphagia   . Dyspnea   . Enlarged prostate   . GERD (gastroesophageal reflux disease)   . Glaucoma, both eyes   . Heart murmur   . History of stomach ulcers 1980s  . Hyperlipidemia   . Hypertension    "off RX for years now cause of coughing w/Lisinopril" (03/13/2017)  . IBS (irritable bowel syndrome)   . Paranoid schizophrenia ([facility])   . Peripheral vascular disease ([facility])   . Pneumonia 1990s  . Pre-diabetes    "one time" (03/13/2017)  . Stroke [facility]) 04/2016   "eye stroke; left eye" (03/13/2017)  . Syncopal episodes    Meds reviewed. Remeron noted  HT: Ht Readings from Last 1 Encounters:  04/18/17 5\' 10"  (1.778 m)    WT: Wt Readings from Last 3 Encounters:  04/18/17 203 lb 6.4 oz (92.3 kg)  04/17/17 201 lb 15.1 oz (91.6 kg)  03/22/17 200 lb (90.7 kg)     BMI 29.2   Current tobacco use? Yes   Labs:  Lipid Panel  No results found for: CHOL, TRIG, HDL, CHOLHDL, VLDL, LDLCALC, LDLDIRECT 12/24/15 Lipid Total chol 108 Trig 278 HDL 27 LDL 25  No results found for: HGBA1C  05/17/16 A1c 5.4 CBG (last 3)  No results for input(s): GLUCAP in the last 72 hours.  Nutrition Diagnosis ? Food-and nutrition-related knowledge deficit related to lack of exposure to information as related to diagnosis of: ? CVD ? Overweight related to excessive energy intake as evidenced by a BMI of 29.2  Nutrition Goal(s):  ? Pt to  identify food quantities necessary to achieve weight loss of 6-24 lb (2.7-10.9 kg) at graduation from cardiac rehab.   Plan:  Pt to attend nutrition classes ? Nutrition I ? Nutrition II ? Portion Distortion ? Diabetes Blitz ? Diabetes Q & A Will provide client-centered nutrition education as part of interdisciplinary care.   Monitor and evaluate progress toward nutrition goal with team.  Mickle PlumbEdna Devansh Riese, M.Ed, RD, LDN, CDE 04/19/2017 2:14 PM

## 2017-04-23 ENCOUNTER — Ambulatory Visit (HOSPITAL_COMMUNITY): Payer: Medicare Other

## 2017-04-25 ENCOUNTER — Ambulatory Visit (HOSPITAL_COMMUNITY): Payer: Medicare Other

## 2017-04-27 ENCOUNTER — Ambulatory Visit (HOSPITAL_COMMUNITY): Payer: Medicare Other

## 2017-04-28 ENCOUNTER — Other Ambulatory Visit: Payer: Self-pay | Admitting: Internal Medicine

## 2017-04-30 ENCOUNTER — Ambulatory Visit (HOSPITAL_COMMUNITY): Payer: Medicare Other

## 2017-05-01 ENCOUNTER — Encounter: Payer: Self-pay | Admitting: Cardiology

## 2017-05-01 ENCOUNTER — Ambulatory Visit (INDEPENDENT_AMBULATORY_CARE_PROVIDER_SITE_OTHER): Payer: Medicare Other | Admitting: Cardiology

## 2017-05-01 ENCOUNTER — Telehealth (HOSPITAL_COMMUNITY): Payer: Self-pay | Admitting: *Deleted

## 2017-05-01 VITALS — BP 112/74 | HR 80 | Ht 71.0 in | Wt 206.8 lb

## 2017-05-01 DIAGNOSIS — I2510 Atherosclerotic heart disease of native coronary artery without angina pectoris: Secondary | ICD-10-CM | POA: Diagnosis not present

## 2017-05-01 NOTE — Telephone Encounter (Signed)
Returned call from message left.  Pt seen in the cardiologist office - [facility].  Cleared from cardiology viewpoint to proceed with cardiac rehab. Pt caregiver wishes to proceed with cardiac rehab in January.  Advised that due to the extended delay to begin, pt would have to complete orientation.  Advised when ready to proceed to please call us and

## 2017-05-01 NOTE — Patient Instructions (Signed)
Medication Instructions:     If you need a refill on your cardiac medications before your next appointment, please call your pharmacy.  Labwork: NONE ORDERED  TODAY   Testing/Procedures: NONE ORDERED  TODAY    [facility] February      Any Other Special Instructions Will Be Listed Below (If Applicable).

## 2017-05-01 NOTE — Progress Notes (Signed)
05/01/2017 Chad Moss   September 09, 1955  [id number]  Primary Physician Dorothyann Peng, MD Primary Cardiologist: Dr. Eldridge Dace   Reason for Visit/CC: F/u for Chest Pain   HPI:  Chad Moss is a 61 y.o. male who is being seen today for f/u for chest pain. He has a history significant for COPD, current smoker, Crohn's disease, HLD, HTN,  IBS, paranoid schizophrenia, PVD, & prior CVA affecting the left eye. He was recently diagnosed w/ CAD. He had a chest CT and was found to have coronary artery calcifications. He was referred to Dr. Eldridge Dace for cardiac evaluation. Pt complained of exertional dyspnea and chest pain. Subsequently, he was set up for LHC, which was performed 03/13/17 and revealed 75% stenosis of the mid LCx, successfully treated with PCI + DES. There was mild residual 25% scattered disease in the LAD, RCA and 2nd diag. EF was normal. He was placed on DAPT w/ ASA + Plavix and high dose statin.  He was progressing well and enrolled in cardia rehab at [facility]. However most recently, he developed some mild chest discomfort, responsive to SL NTG. Cardiac rehab notified our office and a f/u appt was arranged. He was seen by Norma Fredrickson, NP. She discussed case with Dr. Anne Fu and it was decided to start him on Imdur and increase his PPI therapy. It was recommended to consider relook cath if no improvement with medical management.    He is back today for f/u. He is here with his caregiver. He reports significant improvement with addition of Imdur. No recurrent CP. No dyspnea. He has tolerated addition of Imdur well w/o side effects. BP is stable.   Unfortunately, he continues to smoke but plans to make efforts to quit. He states he is going to enrol in a smoking cessation program to help him reach his goal.   Current Meds  Medication Sig  . albuterol (PROVENTIL) (2.5 MG/3ML) 0.083% nebulizer solution Take 3 mLs (2.5 mg total) by nebulization every 6 (six) hours as needed for wheezing or  shortness of breath.  . alfuzosin (UROXATRAL) 10 MG 24 hr tablet Take 10 mg by mouth at bedtime.   Marland Kitchen amLODipine (NORVASC) 5 MG tablet Take 1 tablet (5 mg total) by mouth daily.  Marland Kitchen aspirin EC 81 MG tablet Take 81 mg by mouth daily.  Marland Kitchen atorvastatin (LIPITOR) 40 MG tablet Take 1 tablet (40 mg total) by mouth daily at 6 PM.  . bimatoprost (LUMIGAN) 0.01 % SOLN Place 1 drop into the left eye at bedtime.  . brimonidine (ALPHAGAN) 0.15 % ophthalmic solution Place 1 drop into the left eye 2 (two) times daily.   . cholestyramine (QUESTRAN) 4 G packet Take 1 packet by mouth daily.   . clonazePAM (KLONOPIN) 1 MG tablet Take 1 mg by mouth at bedtime. Anxiety  . clopidogrel (PLAVIX) 75 MG tablet Take 1 tablet (75 mg total) by mouth daily with breakfast.  . dicyclomine (BENTYL) 10 MG capsule Take 10 mg by mouth 4 (four) times daily -  before meals and at bedtime.   . diphenhydrAMINE (BENADRYL) 25 MG tablet Take 50 mg by mouth at bedtime.   . finasteride (PROSCAR) 5 MG tablet Take 5 mg by mouth daily.  . isosorbide mononitrate (IMDUR) 30 MG 24 hr tablet Take 1 tablet (30 mg total) by mouth daily.  Marland Kitchen loxapine (LOXITANE) 10 MG capsule Take 10 mg by mouth 2 (two) times daily. 1 tab in AM and 2 tabs in PM  . mercaptopurine (  PURINETHOL) 50 MG tablet Take 75 mg by mouth daily. 1 tablet and a half tablet Give on an empty stomach 1 hour before or 2 hours after meals. Caution: Chemotherapy.  . mesalamine (PENTASA) 250 MG CR capsule Take 1,000 mg by mouth 4 (four) times daily.   . mirabegron ER (MYRBETRIQ) 50 MG TB24 tablet Take 50 mg by mouth daily.  . mirtazapine (REMERON) 30 MG tablet Take 30 mg by mouth at bedtime.   . nitroGLYCERIN (NITROSTAT) 0.4 MG SL tablet DISSOLVE 1 TABLET UNDER THE TONGUE EVERY 5 MINUTES AS NEEDED  . ondansetron (ZOFRAN) 8 MG tablet Take 8 mg by mouth 2 (two) times daily.  . pantoprazole (PROTONIX) 40 MG tablet Take 1 tablet (40 mg total) by mouth 2 (two) times daily.  . potassium chloride  (K-DUR) 10 MEQ tablet Take 10 mEq by mouth 2 (two) times daily.  Marland Kitchen. PROAIR HFA 108 (90 Base) MCG/ACT inhaler INHALE 2 PUFFS BY MOUTH INTO THE LUNGS EVERY 6 HOURS AS NEEDED FOR WHEEZING OR SHORTNESS OF BREATH  . SPIRIVA HANDIHALER 18 MCG inhalation capsule INHALE THE CONTENT OF 1 CAPSULE VIA HANDIHALER DAILY  . timolol (TIMOPTIC) 0.5 % ophthalmic solution Administer 1 drop into the left eye Two (2) times a day.  . traZODone (DESYREL) 50 MG tablet Take 150 mg by mouth at bedtime.   Allergies  Allergen Reactions  . Benztropine Anaphylaxis  . Codeine Anaphylaxis  . Meperidine Swelling  . Cyclobenzaprine Other (See Comments)    Pt. Does not remember  . Penicillins Rash    Has patient had a PCN reaction causing immediate rash, facial/tongue/throat swelling, SOB or lightheadedness with hypotension: Yes Has patient had a PCN reaction causing severe rash involving mucus membranes or skin necrosis: No Has patient had a PCN reaction that required hospitalization No Has patient had a PCN reaction occurring within the last 10 years: No If all of the above answers are "NO", then may proceed with Cephalosporin use.   . Sulfamethoxazole-Trimethoprim     REACTION: hives  . Sulfonamide Derivatives     REACTION: hives   Past Medical History:  Diagnosis Date  . Allergic rhinitis   . Anxiety   . Atelectasis   . CAD (coronary artery disease), native coronary artery    9/18 PCI/DES x1 to mLCx, mild diffuse nonobstructive disease, EF 55% on Lv gram  . Chronic bronchitis (HCC)   . COPD (chronic obstructive pulmonary disease) (HCC)   . Crohn's disease (HCC)   . Depression   . DVT (deep venous thrombosis) (HCC)    RLE X 2  . Dysphagia   . Dyspnea   . Enlarged prostate   . GERD (gastroesophageal reflux disease)   . Glaucoma, both eyes   . Heart murmur   . History of stomach ulcers 1980s  . Hyperlipidemia   . Hypertension    "off RX for years now cause of coughing w/Lisinopril" (03/13/2017)  . IBS  (irritable bowel syndrome)   . Paranoid schizophrenia (HCC)   . Peripheral vascular disease (HCC)   . Pneumonia 1990s  . Pre-diabetes    "one time" (03/13/2017)  . Stroke [facility](HCC) 04/2016   "eye stroke; left eye" (03/13/2017)  . Syncopal episodes    Family History  Problem Relation Age of Onset  . Hypertension Mother   . Heart disease Mother   . Hypertension Father   . Heart disease Father   . Lung cancer Brother        2006   Past  Surgical History:  Procedure Laterality Date  . ABDOMINAL HERNIA REPAIR  1990s  . COLECTOMY  1985; ?1989; 1996   "part of my ileum removed"  . CORONARY ANGIOPLASTY WITH STENT PLACEMENT  03/13/2017  . EYE SURGERY Right    "laser; for glaucoma" (03/13/2017)  . FASCIOTOMY Right   . HERNIA REPAIR    . LAPAROSCOPIC CHOLECYSTECTOMY    . PENILE PROSTHESIS IMPLANT  01/2011   Hattie Perch 02/01/2011  . PERIPHERAL VASCULAR CATHETERIZATION Right 1999   "had blood clots in my leg; went in to open them up; dye went into leg; ended up having a fasiotomy"  . TONSILLECTOMY     Social History   Socioeconomic History  . Marital status: Single    Spouse name: Not on file  . Number of children: Not on file  . Years of education: Not on file  . Highest education level: Not on file  Social Needs  . Financial resource strain: Not on file  . Food insecurity - worry: Not on file  . Food insecurity - inability: Not on file  . Transportation needs - medical: Not on file  . Transportation needs - non-medical: Not on file  Occupational History  . Occupation: disabled  Tobacco Use  . Smoking status: Current Every Day Smoker    Packs/day: 1.00    Years: 47.00    Pack years: 47.00    Types: Cigarettes  . Smokeless tobacco: Never Used  . Tobacco comment: Patient given smoking cessation information  Substance and Sexual Activity  . Alcohol use: No    Alcohol/week: 0.0 oz  . Drug use: No  . Sexual activity: No  Other Topics Concern  . Not on file  Social History  Narrative  . Not on file     Review of Systems: General: negative for chills, fever, night sweats or weight changes.  Cardiovascular: negative for chest pain, dyspnea on exertion, edema, orthopnea, palpitations, paroxysmal nocturnal dyspnea or shortness of breath Dermatological: negative for rash Respiratory: negative for cough or wheezing Urologic: negative for hematuria Abdominal: negative for nausea, vomiting, diarrhea, bright red blood per rectum, melena, or hematemesis Neurologic: negative for visual changes, syncope, or dizziness All other systems reviewed and are otherwise negative except as noted above.   Physical Exam:  Blood pressure 112/74, pulse 80, height 5\' 11"  (1.803 m), weight 206 lb 12 oz (93.8 kg), SpO2 96 %.  General appearance: alert, cooperative and no distress Neck: no carotid bruit and no JVD Lungs: clear to auscultation bilaterally Heart: regular rate and rhythm, S1, S2 normal, no murmur, click, rub or gallop Extremities: extremities normal, atraumatic, no cyanosis or edema Pulses: 2+ and symmetric Skin: Skin color, texture, turgor normal. No rashes or lesions Neurologic: Grossly normal  EKG not performed -- personally reviewed   ASSESSMENT AND PLAN:   1. CAD: s/p LHC 03/13/17 which revealed 75% stenosis of the mid LCx, successfully treated with PCI + DES. There was mild residual 25% scattered disease in the LAD, RCA and 2nd diag. EF was normal. Imdur was added to his regimen at last OV given recurrent chest pain. He denies any further symptoms with addition of Imdur. Stable. No CP or dyspnea. Continue DAPT w/ ASA and Plavix. Ok to continue cardiac rehab. Pt encouraged to quit smoking. Keep plans to f/u with Dr. Eldridge Dace in Feb 2019.     Infiniti Hoefling Delmer Islam, MHS [facility] 05/01/2017 12:27 PM

## 2017-05-02 ENCOUNTER — Ambulatory Visit (HOSPITAL_COMMUNITY): Payer: Medicare Other

## 2017-05-04 ENCOUNTER — Ambulatory Visit (HOSPITAL_COMMUNITY): Payer: Medicare Other

## 2017-05-07 ENCOUNTER — Ambulatory Visit (HOSPITAL_COMMUNITY): Payer: Medicare Other

## 2017-05-09 ENCOUNTER — Ambulatory Visit (HOSPITAL_COMMUNITY): Payer: Medicare Other

## 2017-05-11 ENCOUNTER — Encounter (HOSPITAL_COMMUNITY): Payer: Self-pay | Admitting: Emergency Medicine

## 2017-05-11 ENCOUNTER — Other Ambulatory Visit: Payer: Self-pay

## 2017-05-11 ENCOUNTER — Emergency Department (HOSPITAL_BASED_OUTPATIENT_CLINIC_OR_DEPARTMENT_OTHER)
Admit: 2017-05-11 | Discharge: 2017-05-11 | Disposition: A | Discharge status: Home / Self Care | Payer: Medicare Other | Attending: Emergency Medicine | Admitting: Emergency Medicine

## 2017-05-11 ENCOUNTER — Ambulatory Visit (HOSPITAL_COMMUNITY)
Admission: EM | Admit: 2017-05-11 | Discharge: 2017-05-11 | Disposition: A | Discharge status: Home / Self Care | Payer: Medicare Other | Source: Home / Self Care

## 2017-05-11 ENCOUNTER — Emergency Department (HOSPITAL_COMMUNITY)
Admission: EM | Admit: 2017-05-11 | Discharge: 2017-05-11 | Disposition: A | Discharge status: Home / Self Care | Payer: Medicare Other | Attending: Emergency Medicine | Admitting: Emergency Medicine

## 2017-05-11 DIAGNOSIS — M79609 Pain in unspecified limb: Secondary | ICD-10-CM | POA: Diagnosis not present

## 2017-05-11 DIAGNOSIS — M79605 Pain in left leg: Secondary | ICD-10-CM | POA: Diagnosis not present

## 2017-05-11 DIAGNOSIS — J449 Chronic obstructive pulmonary disease, unspecified: Secondary | ICD-10-CM | POA: Diagnosis not present

## 2017-05-11 DIAGNOSIS — F17210 Nicotine dependence, cigarettes, uncomplicated: Secondary | ICD-10-CM | POA: Diagnosis not present

## 2017-05-11 DIAGNOSIS — Z7982 Long term (current) use of aspirin: Secondary | ICD-10-CM | POA: Insufficient documentation

## 2017-05-11 DIAGNOSIS — I2510 Atherosclerotic heart disease of native coronary artery without angina pectoris: Secondary | ICD-10-CM | POA: Diagnosis not present

## 2017-05-11 DIAGNOSIS — Z7902 Long term (current) use of antithrombotics/antiplatelets: Secondary | ICD-10-CM | POA: Diagnosis not present

## 2017-05-11 DIAGNOSIS — Z8673 Personal history of transient ischemic attack (TIA), and cerebral infarction without residual deficits: Secondary | ICD-10-CM | POA: Insufficient documentation

## 2017-05-11 DIAGNOSIS — I10 Essential (primary) hypertension: Secondary | ICD-10-CM | POA: Diagnosis not present

## 2017-05-11 MED ORDER — ACETAMINOPHEN 500 MG PO TABS
500.0000 mg | ORAL_TABLET | Freq: Once | ORAL | Status: AC
  Administered 2017-05-11: 500 mg via ORAL
  Filled 2017-05-11: qty 1

## 2017-05-11 NOTE — ED Triage Notes (Signed)
Pt sent here from PCP and from Urgent Care for doppler study of left leg. Pt has hx of blood clot behind right knee. Pt is on Plavix daily, has not missed any. Pt states he has pain behind his left knee, pain feels the same as prior blood clot- per patient.

## 2017-05-11 NOTE — ED Notes (Signed)
Pt oob to br with steady gait 

## 2017-05-11 NOTE — Discharge Instructions (Signed)
Your [place] was negative for blood clot in your leg.  I suspect this is muscular skeletal nature.  Please follow rice therapy.  I have attached a handout on this.  Please take Tylenol as needed for pain. If you develop worsening or new concerning symptoms you can return to the emergency department for re-evaluation.

## 2017-05-11 NOTE — ED Triage Notes (Signed)
Patient requesting a dopplar done on his leg, was unsure why his doctor sent him here. Patient decided to go to ER to have this done. Pt ambulated with caretaker out to car and taken down to ER.

## 2017-05-11 NOTE — Progress Notes (Signed)
LLE venous duplex prelim: negative for DVT. Patient states previous clot was in artery, not vein. Waveforms noted at left popliteal/ ATA/ PTA are WNL. Farrel DemarkJill Eunice, RDMS, RVT

## 2017-05-11 NOTE — ED Provider Notes (Signed)
[facility] Provider Note   CSN: [id number] Arrival date & time: 05/11/17  1221     History   Chief Complaint Chief Complaint  Patient presents with  . Knee Pain    HPI Chad Moss is a 61 y.o. male with past medical history of coronary artery disease, COPD, DVT (right lower extremity x2 ), hypertension, hyperlipidemia, peripheral vascular disease who presents to the emergency department today from PCP/urgent care for left leg pain.  Patient states for the last 2 weeks he has had pain in the back of his lower hamstring and upper calf that he describes as a throbbing pain that is constant and worsened when walking.  Pain first onset when he awoke 2 weeks ago. He does remember preceding injury or recent increase in activity. He has been taking magnesium for this without any relief.  He is on Plavix and aspirin daily but not currently on any anticoagulants.  He denies any fever, chills, chest pain, shortness of breath, hemoptysis, leg swelling, erythema of the leg, decreased range of motion of ankle or knee. No numbness or tingling.   HPI  Past Medical History:  Diagnosis Date  . Allergic rhinitis   . Anxiety   . Atelectasis   . CAD (coronary artery disease), native coronary artery    9/18 PCI/DES x1 to mLCx, mild diffuse nonobstructive disease, EF 55% on Lv gram  . Chronic bronchitis (HCC)   . COPD (chronic obstructive pulmonary disease) (HCC)   . Crohn's disease (HCC)   . Depression   . DVT (deep venous thrombosis) (HCC)    RLE X 2  . Dysphagia   . Dyspnea   . Enlarged prostate   . GERD (gastroesophageal reflux disease)   . Glaucoma, both eyes   . Heart murmur   . History of stomach ulcers 1980s  . Hyperlipidemia   . Hypertension    "off RX for years now cause of coughing w/Lisinopril" (03/13/2017)  . IBS (irritable bowel syndrome)   . Paranoid schizophrenia (HCC)   . Peripheral vascular disease (HCC)   . Pneumonia 1990s  .  Pre-diabetes    "one time" (03/13/2017)  . Stroke [facility](HCC) 04/2016   "eye stroke; left eye" (03/13/2017)  . Syncopal episodes     Patient Active Problem List   Diagnosis Date Noted  . Essential hypertension   . Coronary artery disease involving native coronary artery of native heart with angina pectoris (HCC) 03/13/2017  . Coronary artery calcification seen on CAT scan 01/12/2017  . Chronic obstructive pulmonary disease, unspecified copd, unspecified chronic bronchitis type 09/13/2015  . Cough 08/09/2015  . Flu-like symptoms 08/09/2015  . Acute sinusitis 08/09/2015  . AP (abdominal pain) 07/09/2014  . COLD (chronic obstructive lung disease) (HCC) 07/09/2014  . COPD exacerbation (HCC) 06/01/2014  . Other malaise and fatigue 09/06/2013  . Encounter for screening for lung cancer 10/09/2012  . Chronic cough 11/19/2011  . Tobacco abuse 04/06/2011  . Mixed hyperlipidemia 01/27/2009  . HYPERTENSION 01/27/2009  . PERIPHERAL VASCULAR DISEASE 01/27/2009  . ALLERGIC RHINITIS 01/27/2009  . Other emphysema (HCC) 01/27/2009  . DIZZINESS, CHRONIC 01/27/2009  . SHORTNESS OF BREATH (SOB) 01/27/2009  . CROHN'S DISEASE 01/26/2009    Past Surgical History:  Procedure Laterality Date  . ABDOMINAL HERNIA REPAIR  1990s  . COLECTOMY  1985; ?1989; 1996   "part of my ileum removed"  . CORONARY ANGIOPLASTY WITH STENT PLACEMENT  03/13/2017  . CORONARY STENT INTERVENTION N/A 03/13/2017  Procedure: CORONARY STENT INTERVENTION;  Surgeon: Corky Crafts, MD;  Location: [facility] INVASIVE CV LAB;  Service: Cardiovascular;  Laterality: N/A;  . EYE SURGERY Right    "laser; for glaucoma" (03/13/2017)  . FASCIOTOMY Right   . HERNIA REPAIR    . LAPAROSCOPIC CHOLECYSTECTOMY    . LEFT HEART CATH AND CORONARY ANGIOGRAPHY N/A 03/13/2017   Procedure: LEFT HEART CATH AND CORONARY ANGIOGRAPHY;  Surgeon: Corky Crafts, MD;  Location: [facility] INVASIVE CV LAB;  Service: Cardiovascular;  Laterality: N/A;  . PENILE  PROSTHESIS IMPLANT  01/2011   Hattie Perch 02/01/2011  . PERIPHERAL VASCULAR CATHETERIZATION Right 1999   "had blood clots in my leg; went in to open them up; dye went into leg; ended up having a fasiotomy"  . TONSILLECTOMY         Home Medications    Prior to Admission medications   Medication Sig Start Date End Date Taking? Authorizing Provider  albuterol (PROVENTIL) (2.5 MG/3ML) 0.083% nebulizer solution Take 3 mLs (2.5 mg total) by nebulization every 6 (six) hours as needed for wheezing or shortness of breath. 03/12/15   Kalman Shan, MD  alfuzosin (UROXATRAL) 10 MG 24 hr tablet Take 10 mg by mouth at bedtime.     [provider]  amLODipine (NORVASC) 5 MG tablet Take 1 tablet (5 mg total) by mouth daily. 03/15/17   Corky Crafts, MD  aspirin EC 81 MG tablet Take 81 mg by mouth daily.    [provider]  atorvastatin (LIPITOR) 40 MG tablet Take 1 tablet (40 mg total) by mouth daily at 6 PM. 03/14/17   Laverda Page B, NP  bimatoprost (LUMIGAN) 0.01 % SOLN Place 1 drop into the left eye at bedtime.    [provider]  brimonidine (ALPHAGAN) 0.15 % ophthalmic solution Place 1 drop into the left eye 2 (two) times daily.     [provider]  cholestyramine Lanetta Inch) 4 G packet Take 1 packet by mouth daily.     [provider]  clonazePAM (KLONOPIN) 1 MG tablet Take 1 mg by mouth at bedtime. Anxiety    [provider]  clopidogrel (PLAVIX) 75 MG tablet Take 1 tablet (75 mg total) by mouth daily with breakfast. 03/15/17   Corky Crafts, MD  dicyclomine (BENTYL) 10 MG capsule Take 10 mg by mouth 4 (four) times daily -  before meals and at bedtime.     [provider]  diphenhydrAMINE (BENADRYL) 25 MG tablet Take 50 mg by mouth at bedtime.     [provider]  finasteride (PROSCAR) 5 MG tablet Take 5 mg by mouth daily.    [provider]  isosorbide mononitrate (IMDUR) 30 MG 24 hr tablet Take 1 tablet  (30 mg total) by mouth daily. 04/18/17 07/17/17  Rosalio Macadamia, NP  loxapine (LOXITANE) 10 MG capsule Take 10 mg by mouth 2 (two) times daily. 1 tab in AM and 2 tabs in PM    [provider]  mercaptopurine (PURINETHOL) 50 MG tablet Take 75 mg by mouth daily. 1 tablet and a half tablet Give on an empty stomach 1 hour before or 2 hours after meals. Caution: Chemotherapy.    [provider]  mesalamine (PENTASA) 250 MG CR capsule Take 1,000 mg by mouth 4 (four) times daily.     [provider]  mirabegron ER (MYRBETRIQ) 50 MG TB24 tablet Take 50 mg by mouth daily.    [provider]  mirtazapine (REMERON) 30  MG tablet Take 30 mg by mouth at bedtime.  01/08/17   [provider]  nitroGLYCERIN (NITROSTAT) 0.4 MG SL tablet DISSOLVE 1 TABLET UNDER THE TONGUE EVERY 5 MINUTES AS NEEDED 04/19/17   Corky Crafts, MD  ondansetron (ZOFRAN) 8 MG tablet Take 8 mg by mouth 2 (two) times daily.    [provider]  pantoprazole (PROTONIX) 40 MG tablet Take 1 tablet (40 mg total) by mouth 2 (two) times daily. 04/18/17 04/18/18  Rosalio Macadamia, NP  potassium chloride (K-DUR) 10 MEQ tablet Take 10 mEq by mouth 2 (two) times daily.    [provider]  PROAIR HFA 108 (90 Base) MCG/ACT inhaler INHALE 2 PUFFS BY MOUTH INTO THE LUNGS EVERY 6 HOURS AS NEEDED FOR WHEEZING OR SHORTNESS OF BREATH 04/30/17   Kalman Shan, MD  SPIRIVA HANDIHALER 18 MCG inhalation capsule INHALE THE CONTENT OF 1 CAPSULE VIA HANDIHALER DAILY 07/10/16   Kalman Shan, MD  timolol (TIMOPTIC) 0.5 % ophthalmic solution Administer 1 drop into the left eye Two (2) times a day. 07/21/16 07/21/17  [provider]  traZODone (DESYREL) 50 MG tablet Take 150 mg by mouth at bedtime.    [provider]    Family History Family History  Problem Relation Age of Onset  . Hypertension Mother   . Heart disease Mother   . Hypertension Father   . Heart disease Father     . Lung cancer Brother        2006    Social History Social History   Tobacco Use  . Smoking status: Current Every Day Smoker    Packs/day: 1.00    Years: 47.00    Pack years: 47.00    Types: Cigarettes  . Smokeless tobacco: Never Used  . Tobacco comment: Patient given smoking cessation information  Substance Use Topics  . Alcohol use: No    Alcohol/week: 0.0 oz  . Drug use: No     Allergies   Benztropine; Codeine; Meperidine; Cyclobenzaprine; Penicillins; Sulfamethoxazole-trimethoprim; and Sulfonamide derivatives   Review of Systems Review of Systems  All other systems reviewed and are negative.    Physical Exam Updated Vital Signs BP 139/81 (BP Location: Right Arm)   Pulse 89   Temp 98.2 F (36.8 C) (Oral)   Resp 20   SpO2 100%   Physical Exam  Constitutional: He appears well-developed and well-nourished.  HENT:  Head: Normocephalic and atraumatic.  Right Ear: External ear normal.  Left Ear: External ear normal.  Eyes: Conjunctivae are normal. Right eye exhibits no discharge. Left eye exhibits no discharge. No scleral icterus.  Cardiovascular:  Pulses:      Dorsalis pedis pulses are 2+ on the left side.       Posterior tibial pulses are 2+ on the left side.  No lower extremity swelling.  Pulmonary/Chest: Effort normal. No respiratory distress.  Musculoskeletal:       Right knee: He exhibits normal range of motion, no swelling, no effusion and no bony tenderness. No medial joint line, no lateral joint line, no MCL, no LCL and no patellar tendon tenderness noted.       Right ankle: Normal. He exhibits normal range of motion. No tenderness. Achilles tendon normal.       Legs:      Left foot: Normal.  Neurovascularly intact distally. Compartments soft above and below affected joint.   Neurological: He is alert. He has normal strength. No sensory deficit.  Painful but able gait.  Skin: Skin is warm and dry. No erythema. No pallor.  No erythema, swelling,  warmth over affected area.  Psychiatric: He has a normal mood and affect.  Nursing note and vitals reviewed.    ED Treatments / Results  Labs (all labs ordered are listed, but only abnormal results are displayed) Labs Reviewed - No data to display  EKG  EKG Interpretation None       Radiology No results found.  Procedures Procedures (including critical care time)  Medications Ordered in ED Medications - No data to display   Initial Impression / Assessment and Plan / ED Course  I have reviewed the triage vital signs and the nursing notes.  Pertinent labs & imaging results that were available during my care of the patient were reviewed by me and considered in my medical decision making (see chart for details).     61 year old male here from PCP for rule out of blood clot in lower extremity.  Patient vital signs are reassuring.  Patient is currently without any chest pain or shortness of breath.  There is no lower extremity swelling. Patient has tenderness palpation on the lower hamstring and proximal calf.  He is neurovascular intact and compartments are soft.  There is no swelling, erythema or heat of the area to suggest infection.  There is no bony tenderness to palpation.  Patient has intact range of motion of knee and ankle without restrictions or pain. [place] ordered in triage and negative for DVT. Suspect musculoskeletal strain.  Patient and wife made aware of this.  I advised conservative therapy and follow with PCP.  Patient appears stable for discharge.  Final Clinical Impressions(s) / ED Diagnoses   Final diagnoses:  Left leg pain    ED Discharge Orders    None       Princella Pellegrini 05/12/17 0134    Doug Sou, MD 05/12/17 1459

## 2017-05-14 ENCOUNTER — Ambulatory Visit (HOSPITAL_COMMUNITY): Payer: Medicare Other

## 2017-05-15 ENCOUNTER — Telehealth: Payer: Self-pay | Admitting: Interventional Cardiology

## 2017-05-15 NOTE — Telephone Encounter (Signed)
I agree with statin change.

## 2017-05-15 NOTE — Telephone Encounter (Signed)
Spoke with patient's caretaker, Chad Moss (per DPR), who states patient has had excruciating leg pain for 2 1/2 weeks that they believe is related to increased dose of atorvastatin. Patient's dose was increased from 10 mg to 40 mg at hospital d/c on 9/26. She states patient was seen in the ED and was advised that he did not have a blood clot in his leg; he was advised to take Tylenol for pain. We discussed other options for treating high cholesterol and she states patient has a bottle of rosuvastatin 20 mg at home (>30 pills). She states he took it previously but was told atorvastatin was better. I advised her to have patient stop atorvastatin for 2 weeks and then start rosuvastatin 20 mg QD. I advised her to call back after 1-2 weeks on rosuvastatin to report tolerance. I advised her to call back sooner with questions or concerns. Chad Moss verbalized understanding and agreement with plan and thanked me for the call.

## 2017-05-15 NOTE — Telephone Encounter (Signed)
New Message   Pt c/o medication issue:  1. Name of Medication: atorvastatin   2. How are you currently taking this medication (dosage and times per day)? 75m  3. Are you having a reaction (difficulty breathing--STAT)? Leg cramps  4. What is your medication issue? Per pt wife would like to speak with RN. She states pts leg cramps have been going on for about 2 weeks. Please call back to discuss

## 2017-05-16 ENCOUNTER — Ambulatory Visit (HOSPITAL_COMMUNITY): Payer: Medicare Other

## 2017-05-17 ENCOUNTER — Other Ambulatory Visit: Payer: Medicare Other | Admitting: *Deleted

## 2017-05-17 DIAGNOSIS — E782 Mixed hyperlipidemia: Secondary | ICD-10-CM

## 2017-05-17 LAB — LIPID PANEL
Chol/HDL Ratio: 4.1 ratio (ref 0.0–5.0)
Cholesterol, Total: 150 mg/dL (ref 100–199)
HDL: 37 mg/dL — ABNORMAL LOW (ref >39)
LDL Calculated: 73 mg/dL (ref 0–99)
Triglycerides: 199 mg/dL — ABNORMAL HIGH (ref 0–149)
VLDL Cholesterol Cal: 40 mg/dL (ref 5–40)

## 2017-05-18 ENCOUNTER — Ambulatory Visit (HOSPITAL_COMMUNITY): Payer: Medicare Other

## 2017-05-21 ENCOUNTER — Ambulatory Visit (HOSPITAL_COMMUNITY): Payer: Medicare Other

## 2017-05-22 ENCOUNTER — Other Ambulatory Visit: Payer: Self-pay | Admitting: Internal Medicine

## 2017-05-23 ENCOUNTER — Ambulatory Visit (HOSPITAL_COMMUNITY): Payer: Medicare Other

## 2017-05-25 ENCOUNTER — Ambulatory Visit (HOSPITAL_COMMUNITY): Payer: Medicare Other

## 2017-05-28 ENCOUNTER — Ambulatory Visit (HOSPITAL_COMMUNITY): Payer: Medicare Other

## 2017-05-29 ENCOUNTER — Emergency Department (HOSPITAL_BASED_OUTPATIENT_CLINIC_OR_DEPARTMENT_OTHER): Admit: 2017-05-29 | Discharge: 2017-05-29 | Disposition: A | Discharge status: Home / Self Care | Payer: Medicare Other

## 2017-05-29 ENCOUNTER — Other Ambulatory Visit: Payer: Self-pay

## 2017-05-29 ENCOUNTER — Encounter (HOSPITAL_COMMUNITY): Payer: Self-pay

## 2017-05-29 ENCOUNTER — Emergency Department (HOSPITAL_COMMUNITY)
Admission: EM | Admit: 2017-05-29 | Discharge: 2017-05-29 | Disposition: A | Discharge status: Home / Self Care | Payer: Medicare Other | Attending: Emergency Medicine | Admitting: Emergency Medicine

## 2017-05-29 DIAGNOSIS — I2510 Atherosclerotic heart disease of native coronary artery without angina pectoris: Secondary | ICD-10-CM | POA: Insufficient documentation

## 2017-05-29 DIAGNOSIS — Z7902 Long term (current) use of antithrombotics/antiplatelets: Secondary | ICD-10-CM | POA: Diagnosis not present

## 2017-05-29 DIAGNOSIS — Z79899 Other long term (current) drug therapy: Secondary | ICD-10-CM | POA: Insufficient documentation

## 2017-05-29 DIAGNOSIS — J449 Chronic obstructive pulmonary disease, unspecified: Secondary | ICD-10-CM | POA: Insufficient documentation

## 2017-05-29 DIAGNOSIS — M79609 Pain in unspecified limb: Secondary | ICD-10-CM

## 2017-05-29 DIAGNOSIS — I10 Essential (primary) hypertension: Secondary | ICD-10-CM | POA: Diagnosis not present

## 2017-05-29 DIAGNOSIS — M79605 Pain in left leg: Secondary | ICD-10-CM | POA: Diagnosis not present

## 2017-05-29 DIAGNOSIS — F17210 Nicotine dependence, cigarettes, uncomplicated: Secondary | ICD-10-CM | POA: Insufficient documentation

## 2017-05-29 DIAGNOSIS — Z7982 Long term (current) use of aspirin: Secondary | ICD-10-CM | POA: Diagnosis not present

## 2017-05-29 DIAGNOSIS — Z8673 Personal history of transient ischemic attack (TIA), and cerebral infarction without residual deficits: Secondary | ICD-10-CM | POA: Insufficient documentation

## 2017-05-29 LAB — CBC
HCT: 38.1 % — ABNORMAL LOW (ref 39.0–52.0)
Hemoglobin: 13.4 g/dL (ref 13.0–17.0)
MCH: 32.6 pg (ref 26.0–34.0)
MCHC: 35.2 g/dL (ref 30.0–36.0)
MCV: 92.7 fL (ref 78.0–100.0)
Platelets: 218 10*3/uL (ref 150–400)
RBC: 4.11 MIL/uL — ABNORMAL LOW (ref 4.22–5.81)
RDW: 15.5 % (ref 11.5–15.5)
WBC: 7.1 10*3/uL (ref 4.0–10.5)

## 2017-05-29 LAB — BASIC METABOLIC PANEL
Anion gap: 7 (ref 5–15)
BUN: 7 mg/dL (ref 6–20)
CO2: 25 mmol/L (ref 22–32)
Calcium: 8.9 mg/dL (ref 8.9–10.3)
Chloride: 107 mmol/L (ref 101–111)
Creatinine, Ser: 1.01 mg/dL (ref 0.61–1.24)
GFR calc Af Amer: >60 mL/min (ref >60)
GFR calc non Af Amer: >60 mL/min (ref >60)
Glucose, Bld: 98 mg/dL (ref 65–99)
Potassium: 3.5 mmol/L (ref 3.5–5.1)
Sodium: 139 mmol/L (ref 135–145)

## 2017-05-29 MED ORDER — METHOCARBAMOL 500 MG PO TABS
500.0000 mg | ORAL_TABLET | Freq: Once | ORAL | Status: AC
  Administered 2017-05-29: 500 mg via ORAL
  Filled 2017-05-29: qty 1

## 2017-05-29 MED ORDER — METHOCARBAMOL 500 MG PO TABS: 500.0000 mg | ORAL_TABLET | Freq: Two times a day (BID) | ORAL | 0 refills | Status: DC

## 2017-05-29 NOTE — ED Provider Notes (Signed)
[facility] EMERGENCY DEPARTMENT Provider Note   CSN: [id number] Arrival date & time: 05/29/17  1127     History   Chief Complaint Chief Complaint  Patient presents with  . Leg Pain    HPI Chad Moss is a 61 y.o. male.  HPI  61 y.o. male with a hx of CAD, COPD, DVT RLE x2, HTN, HLD, presents to the Emergency Department today due to worsening left leg pain. Notes pain x 3 weeks. Doppler x 3 weeks ago negative for DVT. States pain 8/10. Worse with ambulation and weight bearing. Minimal at rest. OTC medications with minimal effect. No N/V. No CP/SOB/ABD pain. No fevers. Pt sent from PCP due to concern for blood clot due to hx of DVT. Not on anticoagulation. On plavix and aspirin. No other symptoms noted   Past Medical History:  Diagnosis Date  . Allergic rhinitis   . Anxiety   . Atelectasis   . CAD (coronary artery disease), native coronary artery    9/18 PCI/DES x1 to mLCx, mild diffuse nonobstructive disease, EF 55% on Lv gram  . Chronic bronchitis (HCC)   . COPD (chronic obstructive pulmonary disease) (HCC)   . Crohn's disease (HCC)   . Depression   . DVT (deep venous thrombosis) (HCC)    RLE X 2  . Dysphagia   . Dyspnea   . Enlarged prostate   . GERD (gastroesophageal reflux disease)   . Glaucoma, both eyes   . Heart murmur   . History of stomach ulcers 1980s  . Hyperlipidemia   . Hypertension    "off RX for years now cause of coughing w/Lisinopril" (03/13/2017)  . IBS (irritable bowel syndrome)   . Paranoid schizophrenia (HCC)   . Peripheral vascular disease (HCC)   . Pneumonia 1990s  . Pre-diabetes    "one time" (03/13/2017)  . Stroke [facility]) 04/2016   "eye stroke; left eye" (03/13/2017)  . Syncopal episodes     Patient Active Problem List   Diagnosis Date Noted  . Essential hypertension   . Coronary artery disease involving native coronary artery of native heart with angina pectoris (HCC) 03/13/2017  . Coronary artery calcification  seen on CAT scan 01/12/2017  . Chronic obstructive pulmonary disease, unspecified copd, unspecified chronic bronchitis type 09/13/2015  . Cough 08/09/2015  . Flu-like symptoms 08/09/2015  . Acute sinusitis 08/09/2015  . AP (abdominal pain) 07/09/2014  . COLD (chronic obstructive lung disease) (HCC) 07/09/2014  . COPD exacerbation (HCC) 06/01/2014  . Other malaise and fatigue 09/06/2013  . Encounter for screening for lung cancer 10/09/2012  . Chronic cough 11/19/2011  . Tobacco abuse 04/06/2011  . Mixed hyperlipidemia 01/27/2009  . HYPERTENSION 01/27/2009  . PERIPHERAL VASCULAR DISEASE 01/27/2009  . ALLERGIC RHINITIS 01/27/2009  . Other emphysema (HCC) 01/27/2009  . DIZZINESS, CHRONIC 01/27/2009  . SHORTNESS OF BREATH (SOB) 01/27/2009  . CROHN'S DISEASE 01/26/2009    Past Surgical History:  Procedure Laterality Date  . ABDOMINAL HERNIA REPAIR  1990s  . COLECTOMY  1985; ?1989; 1996   "part of my ileum removed"  . CORONARY ANGIOPLASTY WITH STENT PLACEMENT  03/13/2017  . CORONARY STENT INTERVENTION N/A 03/13/2017   Procedure: CORONARY STENT INTERVENTION;  Surgeon: Corky Crafts, MD;  Location: [facility] INVASIVE CV LAB;  Service: Cardiovascular;  Laterality: N/A;  . EYE SURGERY Right    "laser; for glaucoma" (03/13/2017)  . FASCIOTOMY Right   . HERNIA REPAIR    . LAPAROSCOPIC CHOLECYSTECTOMY    . LEFT  HEART CATH AND CORONARY ANGIOGRAPHY N/A 03/13/2017   Procedure: LEFT HEART CATH AND CORONARY ANGIOGRAPHY;  Surgeon: Corky CraftsVaranasi, Jayadeep S, MD;  Location: [facility] INVASIVE CV LAB;  Service: Cardiovascular;  Laterality: N/A;  . PENILE PROSTHESIS IMPLANT  01/2011   Hattie Perch/notes 02/01/2011  . PERIPHERAL VASCULAR CATHETERIZATION Right 1999   "had blood clots in my leg; went in to open them up; dye went into leg; ended up having a fasiotomy"  . TONSILLECTOMY         Home Medications    Prior to Admission medications   Medication Sig Start Date End Date Taking? Authorizing Provider  albuterol  (PROVENTIL) (2.5 MG/3ML) 0.083% nebulizer solution Take 3 mLs (2.5 mg total) by nebulization every 6 (six) hours as needed for wheezing or shortness of breath. 03/12/15   Kalman Shanamaswamy, Murali, MD  alfuzosin (UROXATRAL) 10 MG 24 hr tablet Take 10 mg by mouth at bedtime.     [provider]  amLODipine (NORVASC) 5 MG tablet Take 1 tablet (5 mg total) by mouth daily. 03/15/17   Corky CraftsVaranasi, Jayadeep S, MD  aspirin EC 81 MG tablet Take 81 mg by mouth daily.    [provider]  bimatoprost (LUMIGAN) 0.01 % SOLN Place 1 drop into the left eye at bedtime.    [provider]  brimonidine (ALPHAGAN) 0.15 % ophthalmic solution Place 1 drop into the left eye 2 (two) times daily.     [provider]  cholestyramine Lanetta Inch(QUESTRAN) 4 G packet Take 1 packet by mouth daily.     [provider]  clonazePAM (KLONOPIN) 1 MG tablet Take 1 mg by mouth at bedtime. Anxiety    [provider]  clopidogrel (PLAVIX) 75 MG tablet Take 1 tablet (75 mg total) by mouth daily with breakfast. 03/15/17   Corky CraftsVaranasi, Jayadeep S, MD  dicyclomine (BENTYL) 10 MG capsule Take 10 mg by mouth 4 (four) times daily -  before meals and at bedtime.     [provider]  diphenhydrAMINE (BENADRYL) 25 MG tablet Take 50 mg by mouth at bedtime.     [provider]  finasteride (PROSCAR) 5 MG tablet Take 5 mg by mouth daily.    [provider]  isosorbide mononitrate (IMDUR) 30 MG 24 hr tablet Take 1 tablet (30 mg total) by mouth daily. 04/18/17 07/17/17  Rosalio MacadamiaGerhardt, Lori C, NP  loxapine (LOXITANE) 10 MG capsule Take 10 mg by mouth 2 (two) times daily. 1 tab in AM and 2 tabs in PM    [provider]  mercaptopurine (PURINETHOL) 50 MG tablet Take 75 mg by mouth daily. 1 tablet and a half tablet Give on an empty stomach 1 hour before or 2 hours after meals. Caution: Chemotherapy.    [provider]  mesalamine (PENTASA) 250 MG CR capsule Take 1,000 mg by mouth 4 (four)  times daily.     [provider]  mirabegron ER (MYRBETRIQ) 50 MG TB24 tablet Take 50 mg by mouth daily.    [provider]  mirtazapine (REMERON) 30 MG tablet Take 30 mg by mouth at bedtime.  01/08/17   [provider]  nitroGLYCERIN (NITROSTAT) 0.4 MG SL tablet DISSOLVE 1 TABLET UNDER THE TONGUE EVERY 5 MINUTES AS NEEDED 04/19/17   Corky CraftsVaranasi, Jayadeep S, MD  ondansetron (ZOFRAN) 8 MG tablet Take 8 mg by mouth 2 (two) times daily.    [provider]  pantoprazole (PROTONIX) 40 MG tablet Take 1 tablet (40 mg total) by mouth 2 (two) times  daily. 04/18/17 04/18/18  Rosalio Macadamia, NP  potassium chloride (K-DUR) 10 MEQ tablet Take 10 mEq by mouth 2 (two) times daily.    [provider]  PROAIR HFA 108 (90 Base) MCG/ACT inhaler INHALE 2 PUFFS BY MOUTH INTO THE LUNGS EVERY 6 HOURS AS NEEDED FOR WHEEZING OR SHORTNESS OF BREATH 05/23/17   Kalman Shan, MD  SPIRIVA HANDIHALER 18 MCG inhalation capsule INHALE THE CONTENT OF 1 CAPSULE VIA HANDIHALER DAILY 07/10/16   Kalman Shan, MD  timolol (TIMOPTIC) 0.5 % ophthalmic solution Administer 1 drop into the left eye Two (2) times a day. 07/21/16 07/21/17  [provider]  traZODone (DESYREL) 50 MG tablet Take 150 mg by mouth at bedtime.    [provider]    Family History Family History  Problem Relation Age of Onset  . Hypertension Mother   . Heart disease Mother   . Hypertension Father   . Heart disease Father   . Lung cancer Brother        2006    Social History Social History   Tobacco Use  . Smoking status: Current Every Day Smoker    Packs/day: 1.00    Years: 47.00    Pack years: 47.00    Types: Cigarettes  . Smokeless tobacco: Never Used  . Tobacco comment: Patient given smoking cessation information  Substance Use Topics  . Alcohol use: No    Alcohol/week: 0.0 oz  . Drug use: No     Allergies   Benztropine; Codeine; Meperidine; Cyclobenzaprine; Penicillins;  Sulfamethoxazole-trimethoprim; and Sulfonamide derivatives   Review of Systems Review of Systems ROS reviewed and all are negative for acute change except as noted in the HPI.  Physical Exam Updated Vital Signs BP 120/89 (BP Location: Right Arm)   Pulse 92   Temp 98.1 F (36.7 C) (Oral)   Resp 20   Ht 5\' 11"  (1.803 m)   Wt 93.4 kg (206 lb)   SpO2 98%   BMI 28.73 kg/m   Physical Exam  Constitutional: He is oriented to person, place, and time. He appears well-developed and well-nourished. No distress.  HENT:  Head: Normocephalic and atraumatic.  Right Ear: Tympanic membrane, external ear and ear canal normal.  Left Ear: Tympanic membrane, external ear and ear canal normal.  Nose: Nose normal.  Mouth/Throat: Uvula is midline, oropharynx is clear and moist and mucous membranes are normal. No trismus in the jaw. No oropharyngeal exudate, posterior oropharyngeal erythema or tonsillar abscesses.  Eyes: EOM are normal. Pupils are equal, round, and reactive to light.  Neck: Normal range of motion. Neck supple. No tracheal deviation present.  Cardiovascular: Normal rate, regular rhythm, S1 normal, S2 normal, normal heart sounds, intact distal pulses and normal pulses.  Pulmonary/Chest: Effort normal and breath sounds normal. No respiratory distress. He has no decreased breath sounds. He has no wheezes. He has no rhonchi. He has no rales.  Abdominal: Normal appearance and bowel sounds are normal. There is no tenderness.  Musculoskeletal: Normal range of motion.  TTP along left posterior knee as well as along quadriceps. No calf tenderness. No palpable or visible deformities. ROM intact. Ambulates well. NVI.  Neurological: He is alert and oriented to person, place, and time.  Skin: Skin is warm and dry.  Psychiatric: He has a normal mood and affect. His speech is normal and behavior is normal. Thought content normal.  Nursing note and vitals reviewed.    ED Treatments / Results   Labs (all labs ordered  are listed, but only abnormal results are displayed) Labs Reviewed  CBC - Abnormal; Notable for the following components:      Result Value   RBC 4.11 (*)    HCT 38.1 (*)    All other components within normal limits  BASIC METABOLIC PANEL    EKG  EKG Interpretation None       Radiology No results found.  Procedures Procedures (including critical care time)  Medications Ordered in ED Medications - No data to display   Initial Impression / Assessment and Plan / ED Course  I have reviewed the triage vital signs and the nursing notes.  Pertinent labs & imaging results that were available during my care of the patient were reviewed by me and considered in my medical decision making (see chart for details).  Final Clinical Impressions(s) / ED Diagnoses   {I have reviewed and evaluated the relevant imaging studies.  {I have reviewed the relevant previous healthcare records.  {I obtained HPI from historian.   ED Course:  Assessment: Patient DVT US negative for DVT. This is second negative DVT US. TTP along left posterior knee as well as along quadriceps. No calf tenderness. No palpable or visible deformities. ROM intact. Ambulates well. NVI. Pt advised to follow up with PCP. Possible musculoskeletal with strain vs Baker's Cyst. Conservative therapy recommended and discussed. Patient will be discharged home & is agreeable with above plan. Returns precautions discussed. Pt appears safe for discharge  Disposition/Plan:  DC Home Additional Verbal discharge instructions given and discussed with patient.  Pt Instructed to f/u with PCP in the next week for evaluation and treatment of symptoms. Return precautions given Pt acknowledges and agrees with plan  Supervising Physician Wynetta Fines, MD  Final diagnoses:  Left leg pain    ED Discharge Orders    None       Audry Pili, PA-C 05/29/17 1709    Wynetta Fines, MD 05/29/17 [phone]

## 2017-05-29 NOTE — ED Triage Notes (Signed)
Pt here for recurrent and worsening leg pain. He states he has had the pain for 3 weeks. He had a doppler 3 weeks ago which was negative for DVT. He reports pain has gotten persistently worse.

## 2017-05-29 NOTE — Discharge Instructions (Signed)
Please read and follow all provided instructions.  Your diagnoses today include:  1. Left leg pain     Tests performed today include: Vital signs. See below for your results today.   Medications prescribed:  Take as prescribed   Home care instructions:  Follow any educational materials contained in this packet.  Follow-up instructions: Please follow-up with your primary care provider for further evaluation of symptoms and treatment   Return instructions:  Please return to the Emergency Department if you do not get better, if you get worse, or new symptoms OR  - Fever (temperature greater than 101.40F)  - Bleeding that does not stop with holding pressure to the area    -Severe pain (please note that you may be more sore the day after your accident)  - Chest Pain  - Difficulty breathing  - Severe nausea or vomiting  - Inability to tolerate food and liquids  - Passing out  - Skin becoming red around your wounds  - Change in mental status (confusion or lethargy)  - New numbness or weakness    Please return if you have any other emergent concerns.  Additional Information:  Your vital signs today were: BP (!) 157/80    Pulse 92    Temp 98.1 F (36.7 C) (Oral)    Resp 20    Ht 5\' 11"  (1.803 m)    Wt 93.4 kg (206 lb)    SpO2 99%    BMI 28.73 kg/m  If your blood pressure (BP) was elevated above 135/85 this visit, please have this repeated by your doctor within one month. ---------------

## 2017-05-29 NOTE — Progress Notes (Signed)
Left lower extremity venous duplex has been completed. Negative for DVT. Results were given to Everlene FarrierWilliam Dansie PA.  05/29/17 12:22 PM Olen CordialGreg Dj Senteno RVT

## 2017-05-30 ENCOUNTER — Ambulatory Visit (HOSPITAL_COMMUNITY): Payer: Medicare Other

## 2017-06-01 ENCOUNTER — Ambulatory Visit (HOSPITAL_COMMUNITY): Payer: Medicare Other

## 2017-06-01 ENCOUNTER — Telehealth: Payer: Self-pay | Admitting: Internal Medicine

## 2017-06-01 MED ORDER — PREDNISONE 10 MG PO TABS: ORAL_TABLET | ORAL | 0 refills | Status: DC

## 2017-06-01 MED ORDER — AZITHROMYCIN 250 MG PO TABS: ORAL_TABLET | ORAL | 0 refills | Status: DC

## 2017-06-01 NOTE — Telephone Encounter (Signed)
Spoke with [facility] Ms Small,  She stated that the patient has had a productive cough for the past week with green mucus. Denies any fever, SOB, or chills.   She is requesting a zpak for him.   Wishes to use [facility] on [address].   MR, please advise.

## 2017-06-01 NOTE — Telephone Encounter (Signed)
[place] for z pak and if this alone does not work he should talke Please take prednisone 40 mg x1 day, then 30 mg x1 day, then 20 mg x1 day, then 10 mg x1 day, and then 5 mg x1 day and stop 0- which you can call in and he keep handy   Allergies  Allergen Reactions  . Benztropine Anaphylaxis  . Codeine Anaphylaxis  . Meperidine Swelling  . Cyclobenzaprine Other (See Comments)    Pt. Does not remember  . Penicillins Rash    Has patient had a PCN reaction causing immediate rash, facial/tongue/throat swelling, SOB or lightheadedness with hypotension: Yes Has patient had a PCN reaction causing severe rash involving mucus membranes or skin necrosis: No Has patient had a PCN reaction that required hospitalization No Has patient had a PCN reaction occurring within the last 10 years: No If all of the above answers are "NO", then may proceed with Cephalosporin use.   . Sulfamethoxazole-Trimethoprim     REACTION: hives  . Sulfonamide Derivatives     REACTION: hives

## 2017-06-01 NOTE — Telephone Encounter (Signed)
Spoke with EC, she is aware of MR's recs. Will go ahead and call in both RXs. Nothing else needed at time of call.

## 2017-06-04 ENCOUNTER — Ambulatory Visit (HOSPITAL_COMMUNITY): Payer: Medicare Other

## 2017-06-06 ENCOUNTER — Ambulatory Visit (HOSPITAL_COMMUNITY): Payer: Medicare Other

## 2017-06-06 DIAGNOSIS — H2513 Age-related nuclear cataract, bilateral: Secondary | ICD-10-CM | POA: Diagnosis not present

## 2017-06-06 DIAGNOSIS — Q150 Congenital glaucoma: Secondary | ICD-10-CM | POA: Diagnosis not present

## 2017-06-06 DIAGNOSIS — H3412 Central retinal artery occlusion, left eye: Secondary | ICD-10-CM | POA: Diagnosis not present

## 2017-06-06 DIAGNOSIS — H53481 Generalized contraction of visual field, right eye: Secondary | ICD-10-CM | POA: Diagnosis not present

## 2017-06-08 ENCOUNTER — Ambulatory Visit (HOSPITAL_COMMUNITY): Payer: Medicare Other

## 2017-06-11 ENCOUNTER — Other Ambulatory Visit: Payer: Self-pay | Admitting: Interventional Cardiology

## 2017-06-11 ENCOUNTER — Ambulatory Visit (HOSPITAL_COMMUNITY): Payer: Medicare Other

## 2017-06-13 ENCOUNTER — Ambulatory Visit (HOSPITAL_COMMUNITY): Payer: Medicare Other

## 2017-06-15 ENCOUNTER — Ambulatory Visit (HOSPITAL_COMMUNITY): Payer: Medicare Other

## 2017-06-15 DIAGNOSIS — M79605 Pain in left leg: Secondary | ICD-10-CM | POA: Diagnosis not present

## 2017-06-18 ENCOUNTER — Ambulatory Visit (HOSPITAL_COMMUNITY): Payer: Medicare Other

## 2017-06-20 ENCOUNTER — Ambulatory Visit (HOSPITAL_COMMUNITY): Payer: Medicare Other

## 2017-06-22 ENCOUNTER — Ambulatory Visit (HOSPITAL_COMMUNITY): Payer: Medicare Other

## 2017-06-25 ENCOUNTER — Ambulatory Visit (HOSPITAL_COMMUNITY): Payer: Medicare Other

## 2017-06-27 ENCOUNTER — Ambulatory Visit (HOSPITAL_COMMUNITY): Payer: Medicare Other

## 2017-06-27 DIAGNOSIS — M79605 Pain in left leg: Secondary | ICD-10-CM | POA: Diagnosis not present

## 2017-06-28 ENCOUNTER — Other Ambulatory Visit: Payer: Self-pay | Admitting: Internal Medicine

## 2017-06-29 ENCOUNTER — Ambulatory Visit (HOSPITAL_COMMUNITY): Payer: Medicare Other

## 2017-07-02 ENCOUNTER — Ambulatory Visit (HOSPITAL_COMMUNITY): Payer: Medicare Other

## 2017-07-04 ENCOUNTER — Ambulatory Visit (HOSPITAL_COMMUNITY): Payer: Medicare Other

## 2017-07-06 ENCOUNTER — Ambulatory Visit (HOSPITAL_COMMUNITY): Payer: Medicare Other

## 2017-07-09 ENCOUNTER — Ambulatory Visit (HOSPITAL_COMMUNITY): Payer: Medicare Other

## 2017-07-11 ENCOUNTER — Ambulatory Visit (HOSPITAL_COMMUNITY): Payer: Medicare Other

## 2017-07-12 ENCOUNTER — Ambulatory Visit: Payer: Medicare Other | Attending: Orthopaedic Surgery | Admitting: Physical Therapy

## 2017-07-12 ENCOUNTER — Other Ambulatory Visit: Payer: Self-pay

## 2017-07-12 ENCOUNTER — Encounter: Payer: Self-pay | Admitting: Physical Therapy

## 2017-07-12 DIAGNOSIS — M25562 Pain in left knee: Secondary | ICD-10-CM | POA: Insufficient documentation

## 2017-07-12 DIAGNOSIS — R252 Cramp and spasm: Secondary | ICD-10-CM | POA: Insufficient documentation

## 2017-07-12 DIAGNOSIS — M25662 Stiffness of left knee, not elsewhere classified: Secondary | ICD-10-CM | POA: Diagnosis not present

## 2017-07-12 DIAGNOSIS — M25552 Pain in left hip: Secondary | ICD-10-CM | POA: Diagnosis not present

## 2017-07-12 DIAGNOSIS — R262 Difficulty in walking, not elsewhere classified: Secondary | ICD-10-CM | POA: Insufficient documentation

## 2017-07-12 NOTE — Therapy (Signed)
[facility] [address] [place], [place], [address] Phone: [phone]   Fax:  [phone]  Physical Therapy Evaluation  Patient Details  Name: Chad Moss MRN: [id number] Date of Birth: Aug 21, 1955 Referring Provider: Jerl Santos   Encounter Date: 07/12/2017  PT End of Session - 07/12/17 1116    Visit Number  1    Date for PT Re-Evaluation  09/09/17    PT Start Time  1055    PT Stop Time  1145    PT Time Calculation (min)  50 min    Activity Tolerance  Patient limited by pain    Behavior During Therapy  [facility] for tasks assessed/performed       Past Medical History:  Diagnosis Date  . Allergic rhinitis   . Anxiety   . Atelectasis   . CAD (coronary artery disease), native coronary artery    9/18 PCI/DES x1 to mLCx, mild diffuse nonobstructive disease, EF 55% on Lv gram  . Chronic bronchitis (HCC)   . COPD (chronic obstructive pulmonary disease) (HCC)   . Crohn's disease (HCC)   . Depression   . DVT (deep venous thrombosis) (HCC)    RLE X 2  . Dysphagia   . Dyspnea   . Enlarged prostate   . GERD (gastroesophageal reflux disease)   . Glaucoma, both eyes   . Heart murmur   . History of stomach ulcers 1980s  . Hyperlipidemia   . Hypertension    "off RX for years now cause of coughing w/Lisinopril" (03/13/2017)  . IBS (irritable bowel syndrome)   . Paranoid schizophrenia (HCC)   . Peripheral vascular disease (HCC)   . Pneumonia 1990s  . Pre-diabetes    "one time" (03/13/2017)  . Stroke [facility]) 04/2016   "eye stroke; left eye" (03/13/2017)  . Syncopal episodes     Past Surgical History:  Procedure Laterality Date  . ABDOMINAL HERNIA REPAIR  1990s  . COLECTOMY  1985; ?1989; 1996   "part of my ileum removed"  . CORONARY ANGIOPLASTY WITH STENT PLACEMENT  03/13/2017  . CORONARY STENT INTERVENTION N/A 03/13/2017   Procedure: CORONARY STENT INTERVENTION;  Surgeon: Corky Crafts, MD;  Location: [facility]  INVASIVE CV LAB;  Service: Cardiovascular;  Laterality: N/A;  . EYE SURGERY Right    "laser; for glaucoma" (03/13/2017)  . FASCIOTOMY Right   . HERNIA REPAIR    . LAPAROSCOPIC CHOLECYSTECTOMY    . LEFT HEART CATH AND CORONARY ANGIOGRAPHY N/A 03/13/2017   Procedure: LEFT HEART CATH AND CORONARY ANGIOGRAPHY;  Surgeon: Corky Crafts, MD;  Location: [facility] INVASIVE CV LAB;  Service: Cardiovascular;  Laterality: N/A;  . PENILE PROSTHESIS IMPLANT  01/2011   Hattie Perch 02/01/2011  . PERIPHERAL VASCULAR CATHETERIZATION Right 1999   "had blood clots in my leg; went in to open them up; dye went into leg; ended up having a fasiotomy"  . TONSILLECTOMY      There were no vitals filed for this visit.   Subjective Assessment - 07/12/17 1050    Subjective  Patient reports that he has had left posterior knee and thigh pain for about 3 months, he is unsure of a cause.  He report that they did a doppler and it was negative, x-rays were negative.  Reports that he has gone to the ED twice due to pain    Limitations  Sitting;Standing;Walking;House hold activities    Patient Stated Goals  have less pain    Currently in Pain?  Yes    Pain Score  7     Pain Location  Leg    Pain Orientation  Left;Posterior;Upper;Mid    Pain Descriptors / Indicators  Sharp;Sore    Pain Type  Acute pain    Pain Onset  More than a month ago    Pain Frequency  Constant    Aggravating Factors   standing, sitting, walking, any pressure on the posterior leg pain a 10/10    Pain Relieving Factors  taking pressure off the leg pain is a 4/10    Effect of Pain on Daily Activities  difficulty with walking and ADL's         [facility] PT Assessment - 07/12/17 0001      Assessment   Medical Diagnosis  left HS strain    Referring Provider  Dalldorf    Onset Date/Surgical Date  06/11/17    Prior Therapy  no      Precautions   Precautions  None      Balance Screen   Has the patient fallen in the past 6 months  No    Has the patient  had a decrease in activity level because of a fear of falling?   No    Is the patient reluctant to leave their home because of a fear of falling?   No      Home Environment   Additional Comments  no stairs, lives with a caretaker      Prior Function   Level of Independence  Independent    Vocation  On disability    Leisure  no exercise      ROM / Strength   AROM / PROM / Strength  AROM;Strength      AROM   Overall AROM Comments  Lumbar ROM decreased 50% with some pain in the left HS, left knee extension was to 20 degrees due to pain i nthe posterior left thigh      Strength   Overall Strength Comments  3+/5 for left knee and hip due to pain in the posterior left leg      Flexibility   Soft Tissue Assessment /Muscle Length  yes    Hamstrings  very , very tight and very painful, SLR was limited to 20 degrees      Palpation   Palpation comment  very rigid mm in the left HS, he is very tender in the mid mm belly and tender in the posterior knee and upper calf.  negative Homan's      Ambulation/Gait   Gait Comments  tender with antalgic gait on the left, no device is used             Objective measurements completed on examination: See above findings.      [facility] Adult PT Treatment/Exercise - 07/12/17 0001      Modalities   Modalities  Moist Heat;Electrical Stimulation      Moist Heat Therapy   Number Minutes Moist Heat  15 Minutes    Moist Heat Location  Knee      Electrical Stimulation   Electrical Stimulation Location  left posterior thigh    Electrical Stimulation Action  IFC    Electrical Stimulation Parameters  supine with the leg elevated    Electrical Stimulation Goals  Pain             PT Education - 07/12/17 1115    Education provided  Yes    Education Details  HS stretch in sitting  Person(s) Educated  Patient    Methods  Explanation;Demonstration;Handout    Comprehension  Verbalized understanding;Need further instruction       PT Short  Term Goals - 07/12/17 1121      PT SHORT TERM GOAL #1   Title  independent with initial HEP    Time  2    Period  Weeks    Status  New        PT Long Term Goals - 07/12/17 1121      PT LONG TERM GOAL #1   Title  decrease pain 50%    Time  8    Period  Weeks    Status  New      PT LONG TERM GOAL #2   Title  able to walk without difficulty    Time  8    Period  Weeks    Status  New      PT LONG TERM GOAL #3   Title  sit without difficulty    Time  8    Period  Weeks    Status  New      PT LONG TERM GOAL #4   Title  increase SLR to 45 degrees    Time  8    Period  Weeks    Status  New      PT LONG TERM GOAL #5   Title  increase left hip and knee strength to 4/5    Time  8    Period  Weeks    Status  New             Plan - 07/12/17 1116    Clinical Impression Statement  Patient reports that about 3 months ago he started having left posterior leg pain, he is unsure of a cause.  X-rays and doppler were negative.  He has gone to the ED twice due to high pain levels.  He is very tight in the HS and very tender, SLR was 20 degrees due to pain    Clinical Presentation  Stable    Clinical Decision Making  Low    Rehab Potential  Good    PT Frequency  2x / week    PT Duration  8 weeks    PT Treatment/Interventions  ADLs/Self Care Home Management;Cryotherapy;Electrical Stimulation;Moist Heat;Iontophoresis 4mg /ml Dexamethasone;Gait training;Stair training;Functional mobility training;Therapeutic activities;Therapeutic exercise;Balance training;Neuromuscular re-education;Manual techniques;Patient/family education    PT Next Visit Plan  slowly add exercises to get him moving    Consulted and Agree with Plan of Care  Patient       Patient will benefit from skilled therapeutic intervention in order to improve the following deficits and impairments:  Abnormal gait, Decreased range of motion, Difficulty walking, Increased muscle spasms, Decreased activity tolerance, Pain,  Decreased balance, Impaired flexibility, Postural dysfunction, Decreased strength, Decreased mobility  Visit Diagnosis: Acute pain of left knee - Plan: PT plan of care cert/re-cert  Stiffness of left knee, not elsewhere classified - Plan: PT plan of care cert/re-cert  Pain in left hip - Plan: PT plan of care cert/re-cert  Cramp and spasm - Plan: PT plan of care cert/re-cert  Difficulty in walking, not elsewhere classified - Plan: PT plan of care cert/re-cert     Problem List Patient Active Problem List   Diagnosis Date Noted  . Essential hypertension   . Coronary artery disease involving native coronary artery of native heart with angina pectoris (HCC) 03/13/2017  . Coronary artery calcification seen on CAT scan 01/12/2017  .  Chronic obstructive pulmonary disease, unspecified copd, unspecified chronic bronchitis type 09/13/2015  . Cough 08/09/2015  . Flu-like symptoms 08/09/2015  . Acute sinusitis 08/09/2015  . AP (abdominal pain) 07/09/2014  . COLD (chronic obstructive lung disease) (HCC) 07/09/2014  . COPD exacerbation (HCC) 06/01/2014  . Other malaise and fatigue 09/06/2013  . Encounter for screening for lung cancer 10/09/2012  . Chronic cough 11/19/2011  . Tobacco abuse 04/06/2011  . Mixed hyperlipidemia 01/27/2009  . HYPERTENSION 01/27/2009  . PERIPHERAL VASCULAR DISEASE 01/27/2009  . ALLERGIC RHINITIS 01/27/2009  . Other emphysema (HCC) 01/27/2009  . DIZZINESS, CHRONIC 01/27/2009  . SHORTNESS OF BREATH (SOB) 01/27/2009  . CROHN'S DISEASE 01/26/2009    Jearld Lesch., PT 07/12/2017, 11:25 AM  [facility] [address] [place], [place], [address] Phone: [phone]   Fax:  [phone]  Name: Chad Moss MRN: [id number] Date of Birth: February 24, 1956

## 2017-07-13 ENCOUNTER — Ambulatory Visit (HOSPITAL_COMMUNITY): Payer: Medicare Other

## 2017-07-16 ENCOUNTER — Ambulatory Visit (HOSPITAL_COMMUNITY): Payer: Medicare Other

## 2017-07-17 ENCOUNTER — Ambulatory Visit: Payer: Medicare Other | Admitting: Physical Therapy

## 2017-07-17 ENCOUNTER — Encounter: Payer: Self-pay | Admitting: Physical Therapy

## 2017-07-17 DIAGNOSIS — M25662 Stiffness of left knee, not elsewhere classified: Secondary | ICD-10-CM | POA: Diagnosis not present

## 2017-07-17 DIAGNOSIS — M25562 Pain in left knee: Secondary | ICD-10-CM

## 2017-07-17 DIAGNOSIS — M25552 Pain in left hip: Secondary | ICD-10-CM | POA: Diagnosis not present

## 2017-07-17 DIAGNOSIS — R262 Difficulty in walking, not elsewhere classified: Secondary | ICD-10-CM | POA: Diagnosis not present

## 2017-07-17 DIAGNOSIS — R252 Cramp and spasm: Secondary | ICD-10-CM | POA: Diagnosis not present

## 2017-07-17 NOTE — Therapy (Signed)
[place], [place], [address] Phone: [phone]   Fax:  [phone]  Physical Therapy Treatment  Patient Details  Name: Chad Moss MRN: [id number] Date of Birth: [birth year] Referring Provider: Rhona Raider   Encounter Date: 07/17/2017  PT End of Session - 07/17/17 1139    Visit Number  2    Date for PT Re-Evaluation  09/09/17    PT Start Time  1112    PT Stop Time  1149    PT Time Calculation (min)  37 min    Activity Tolerance  Patient tolerated treatment well    Behavior During Therapy  [facility] for tasks assessed/performed       Past Medical History:  Diagnosis Date  . Allergic rhinitis   . Anxiety   . Atelectasis   . CAD (coronary artery disease), native coronary artery    9/18 PCI/DES x1 to mLCx, mild diffuse nonobstructive disease, EF 55% on Lv gram  . Chronic bronchitis ([place])   . COPD (chronic obstructive pulmonary disease) ([place])   . Crohn's disease ([place])   . Depression   . DVT (deep venous thrombosis) (HCC)    RLE X 2  . Dysphagia   . Dyspnea   . Enlarged prostate   . GERD (gastroesophageal reflux disease)   . Glaucoma, both eyes   . Heart murmur   . History of stomach ulcers 1980s  . Hyperlipidemia   . Hypertension    "off RX for years now cause of coughing w/Lisinopril" (03/13/2017)  . IBS (irritable bowel syndrome)   . Paranoid schizophrenia ([place])   . Peripheral vascular disease ([place])   . Pneumonia 1990s  . Pre-diabetes    "one time" (03/13/2017)  . Stroke [facility]) 04/2016   "eye stroke; left eye" (03/13/2017)  . Syncopal episodes     Past Surgical History:  Procedure Laterality Date  . ABDOMINAL HERNIA REPAIR  1990s  . COLECTOMY  1985; ?1989; 1996   "part of my ileum removed"  . CORONARY ANGIOPLASTY WITH STENT PLACEMENT  03/13/2017  . CORONARY STENT INTERVENTION N/A 03/13/2017   Procedure: CORONARY STENT INTERVENTION;  Surgeon: Jettie Booze, MD;  Location: [employer] CV LAB;  Service: Cardiovascular;  Laterality: N/A;  . EYE SURGERY Right    "laser; for glaucoma" (03/13/2017)  . FASCIOTOMY Right   . HERNIA REPAIR    . LAPAROSCOPIC CHOLECYSTECTOMY    . LEFT HEART CATH AND CORONARY ANGIOGRAPHY N/A 03/13/2017   Procedure: LEFT HEART CATH AND CORONARY ANGIOGRAPHY;  Surgeon: Jettie Booze, MD;  Location: [place] CV LAB;  Service: Cardiovascular;  Laterality: N/A;  . PENILE PROSTHESIS IMPLANT  01/2011   Archie Endo 02/01/2011  . PERIPHERAL VASCULAR CATHETERIZATION Right 1999   "had blood clots in my leg; went in to open them up; dye went into leg; ended up having a fasiotomy"  . TONSILLECTOMY      There were no vitals filed for this visit.  Subjective Assessment - 07/17/17 1116    Subjective  "Its still hurting, but it is not hurting as bad as it was last week"    Currently in Pain?  No/denies                      [facility] Adult PT Treatment/Exercise - 07/17/17 0001      Exercises   Exercises  Knee/Hip      Knee/Hip Exercises: Stretches   Passive Hamstring Stretch  Both;5 reps;10 seconds      Knee/Hip Exercises: Aerobic   Nustep  L4 x 6 min       Knee/Hip Exercises: Machines for Strengthening   Cybex Knee Extension  5lb 2x10      Knee/Hip Exercises: Standing   Forward Step Up  Both;1 set;10 reps;Hand Hold: 0;Step Height: 6"    Other Standing Knee Exercises  standing march x10       Knee/Hip Exercises: Seated   Sit to Sand  2 sets;without UE support;10 reps      Modalities   Modalities  Moist Heat      Moist Heat Therapy   Number Minutes Moist Heat  10 Minutes    Moist Heat Location  Hip posterior                PT Short Term Goals - 07/17/17 1142      PT SHORT TERM GOAL #1   Title  independent with initial HEP    Status  Partially Met        PT Long Term Goals - 07/12/17 1121      PT LONG TERM GOAL #1   Title  decrease pain 50%    Time  8    Period  Weeks    Status  New      PT LONG  TERM GOAL #2   Title  able to walk without difficulty    Time  8    Period  Weeks    Status  New      PT LONG TERM GOAL #3   Title  sit without difficulty    Time  8    Period  Weeks    Status  New      PT LONG TERM GOAL #4   Title  increase SLR to 45 degrees    Time  8    Period  Weeks    Status  New      PT LONG TERM GOAL #5   Title  increase left hip and knee strength to 4/5    Time  8    Period  Weeks    Status  New            Plan - 07/17/17 1139    Clinical Impression Statement  Pt reports that's his HS fells better than it did last week. All exercises avoided direct stress on HS, no reports of pain. Explained to the pt what a HS strain was and that we needed to heat it heal. Pt fatigues quick with standing march     Rehab Potential  Good    PT Frequency  2x / week    PT Duration  8 weeks    PT Treatment/Interventions  ADLs/Self Care Home Management;Cryotherapy;Electrical Stimulation;Moist Heat;Iontophoresis 35m/ml Dexamethasone;Gait training;Stair training;Functional mobility training;Therapeutic activities;Therapeutic exercise;Balance training;Neuromuscular re-education;Manual techniques;Patient/family education    PT Next Visit Plan  slowly add exercises to get him moving       Patient will benefit from skilled therapeutic intervention in order to improve the following deficits and impairments:  Abnormal gait, Decreased range of motion, Difficulty walking, Increased muscle spasms, Decreased activity tolerance, Pain, Decreased balance, Impaired flexibility, Postural dysfunction, Decreased strength, Decreased mobility  Visit Diagnosis: Acute pain of left knee  Stiffness of left knee, not elsewhere classified  Pain in left hip     Problem List Patient Active Problem List   Diagnosis Date Noted  . Essential hypertension   . Coronary artery disease  involving native coronary artery of native heart with angina pectoris ([place]) 03/13/2017  . Coronary artery  calcification seen on CAT scan 01/12/2017  . Chronic obstructive pulmonary disease, unspecified copd, unspecified chronic bronchitis type 09/13/2015  . Cough 08/09/2015  . Flu-like symptoms 08/09/2015  . Acute sinusitis 08/09/2015  . AP (abdominal pain) 07/09/2014  . COLD (chronic obstructive lung disease) ([place]) 07/09/2014  . COPD exacerbation ([place]) 06/01/2014  . Other malaise and fatigue 09/06/2013  . Encounter for screening for lung cancer 10/09/2012  . Chronic cough 11/19/2011  . Tobacco abuse 04/06/2011  . Mixed hyperlipidemia 01/27/2009  . HYPERTENSION 01/27/2009  . PERIPHERAL VASCULAR DISEASE 01/27/2009  . ALLERGIC RHINITIS 01/27/2009  . Other emphysema ([place]) 01/27/2009  . DIZZINESS, CHRONIC 01/27/2009  . SHORTNESS OF BREATH (SOB) 01/27/2009  . CROHN'S DISEASE 01/26/2009    Scot Jun, PTA 07/17/2017, 11:43 AM  [place], [place], [address] Phone: [phone]   Fax:  [phone]  Name: ROCHELLE NEPHEW MRN: [id number] Date of Birth: [birth year]

## 2017-07-18 ENCOUNTER — Ambulatory Visit (HOSPITAL_COMMUNITY): Payer: Medicare Other

## 2017-07-19 ENCOUNTER — Encounter: Payer: Self-pay | Admitting: Physical Therapy

## 2017-07-19 ENCOUNTER — Ambulatory Visit: Payer: Medicare Other | Admitting: Physical Therapy

## 2017-07-19 DIAGNOSIS — M25562 Pain in left knee: Secondary | ICD-10-CM | POA: Diagnosis not present

## 2017-07-19 DIAGNOSIS — M25662 Stiffness of left knee, not elsewhere classified: Secondary | ICD-10-CM

## 2017-07-19 DIAGNOSIS — M25552 Pain in left hip: Secondary | ICD-10-CM

## 2017-07-19 DIAGNOSIS — R252 Cramp and spasm: Secondary | ICD-10-CM | POA: Diagnosis not present

## 2017-07-19 DIAGNOSIS — R262 Difficulty in walking, not elsewhere classified: Secondary | ICD-10-CM | POA: Diagnosis not present

## 2017-07-19 NOTE — Therapy (Signed)
[place], [place], [address] Phone: [phone]   Fax:  [phone]  Physical Therapy Treatment  Patient Details  Name: Chad Moss MRN: [id number] Date of Birth: 20-Jul-1955 Referring Provider: Rhona Raider   Encounter Date: 07/19/2017  PT End of Session - 07/19/17 1137    Visit Number  3    Date for PT Re-Evaluation  09/09/17    PT Start Time  1100    PT Stop Time  1150    PT Time Calculation (min)  50 min    Activity Tolerance  Patient tolerated treatment well    Behavior During Therapy  [facility] for tasks assessed/performed       Past Medical History:  Diagnosis Date  . Allergic rhinitis   . Anxiety   . Atelectasis   . CAD (coronary artery disease), native coronary artery    9/18 PCI/DES x1 to mLCx, mild diffuse nonobstructive disease, EF 55% on Lv gram  . Chronic bronchitis ([place])   . COPD (chronic obstructive pulmonary disease) ([place])   . Crohn's disease ([place])   . Depression   . DVT (deep venous thrombosis) (HCC)    RLE X 2  . Dysphagia   . Dyspnea   . Enlarged prostate   . GERD (gastroesophageal reflux disease)   . Glaucoma, both eyes   . Heart murmur   . History of stomach ulcers 1980s  . Hyperlipidemia   . Hypertension    "off RX for years now cause of coughing w/Lisinopril" (03/13/2017)  . IBS (irritable bowel syndrome)   . Paranoid schizophrenia ([place])   . Peripheral vascular disease ([place])   . Pneumonia 1990s  . Pre-diabetes    "one time" (03/13/2017)  . Stroke [facility]) 04/2016   "eye stroke; left eye" (03/13/2017)  . Syncopal episodes     Past Surgical History:  Procedure Laterality Date  . ABDOMINAL HERNIA REPAIR  1990s  . COLECTOMY  1985; ?1989; 1996   "part of my ileum removed"  . CORONARY ANGIOPLASTY WITH STENT PLACEMENT  03/13/2017  . CORONARY STENT INTERVENTION N/A 03/13/2017   Procedure: CORONARY STENT INTERVENTION;  Surgeon: Jettie Booze, MD;  Location: [place] CV LAB;  Service: Cardiovascular;  Laterality: N/A;  . EYE SURGERY Right    "laser; for glaucoma" (03/13/2017)  . FASCIOTOMY Right   . HERNIA REPAIR    . LAPAROSCOPIC CHOLECYSTECTOMY    . LEFT HEART CATH AND CORONARY ANGIOGRAPHY N/A 03/13/2017   Procedure: LEFT HEART CATH AND CORONARY ANGIOGRAPHY;  Surgeon: Jettie Booze, MD;  Location: [place] CV LAB;  Service: Cardiovascular;  Laterality: N/A;  . PENILE PROSTHESIS IMPLANT  01/2011   Archie Endo 02/01/2011  . PERIPHERAL VASCULAR CATHETERIZATION Right 1999   "had blood clots in my leg; went in to open them up; dye went into leg; ended up having a fasiotomy"  . TONSILLECTOMY      There were no vitals filed for this visit.  Subjective Assessment - 07/19/17 1104    Subjective  Pt reports that's he is doing ok    Currently in Pain?  Yes    Pain Score  3     Pain Location  Leg    Pain Orientation  Posterior;Upper                      [facility] Adult PT Treatment/Exercise - 07/19/17 0001      Knee/Hip Exercises: Stretches   Passive  Hamstring Stretch  Both;5 reps;10 seconds      Knee/Hip Exercises: Aerobic   Nustep  L4 x 7 min       Knee/Hip Exercises: Machines for Strengthening   Cybex Knee Extension  10lb 2x10    Cybex Knee Flexion  20lb 3x10       Knee/Hip Exercises: Standing   Forward Step Up  Both;1 set;10 reps;Hand Hold: 0;Step Height: 6"    Other Standing Knee Exercises  standing march 2x10       Knee/Hip Exercises: Seated   Hamstring Curl  2 sets;10 reps;Both    Hamstring Limitations  yellow     Sit to Sand  2 sets;without UE support;10 reps               PT Short Term Goals - 07/19/17 1137      PT SHORT TERM GOAL #1   Title  independent with initial HEP    Status  Achieved        PT Long Term Goals - 07/19/17 1138      PT LONG TERM GOAL #1   Title  decrease pain 50%    Status  Partially Met      PT LONG TERM GOAL #2   Title  able to walk without difficulty    Status   Partially Met      PT LONG TERM GOAL #3   Title  sit without difficulty    Status  Partially Met            Plan - 07/19/17 1138    Clinical Impression Statement  Pt reports no pain with today's exercises, he does reports some HS pain with passive stretching. Pt with better tolerance with standing march.    Rehab Potential  Good    PT Frequency  2x / week    PT Duration  8 weeks    PT Treatment/Interventions  ADLs/Self Care Home Management;Cryotherapy;Electrical Stimulation;Moist Heat;Iontophoresis 31m/ml Dexamethasone;Gait training;Stair training;Functional mobility training;Therapeutic activities;Therapeutic exercise;Balance training;Neuromuscular re-education;Manual techniques;Patient/family education    PT Next Visit Plan  slowly add exercises to get him moving       Patient will benefit from skilled therapeutic intervention in order to improve the following deficits and impairments:  Abnormal gait, Decreased range of motion, Difficulty walking, Increased muscle spasms, Decreased activity tolerance, Pain, Decreased balance, Impaired flexibility, Postural dysfunction, Decreased strength, Decreased mobility  Visit Diagnosis: Acute pain of left knee  Stiffness of left knee, not elsewhere classified  Pain in left hip     Problem List Patient Active Problem List   Diagnosis Date Noted  . Essential hypertension   . Coronary artery disease involving native coronary artery of native heart with angina pectoris ([place] 03/13/2017  . Coronary artery calcification seen on CAT scan 01/12/2017  . Chronic obstructive pulmonary disease, unspecified copd, unspecified chronic bronchitis type 09/13/2015  . Cough 08/09/2015  . Flu-like symptoms 08/09/2015  . Acute sinusitis 08/09/2015  . AP (abdominal pain) 07/09/2014  . COLD (chronic obstructive lung disease) ([place] 07/09/2014  . COPD exacerbation ([place] 06/01/2014  . Other malaise and fatigue 09/06/2013  . Encounter for screening for  lung cancer 10/09/2012  . Chronic cough 11/19/2011  . Tobacco abuse 04/06/2011  . Mixed hyperlipidemia 01/27/2009  . HYPERTENSION 01/27/2009  . PERIPHERAL VASCULAR DISEASE 01/27/2009  . ALLERGIC RHINITIS 01/27/2009  . Other emphysema ([place] 01/27/2009  . DIZZINESS, CHRONIC 01/27/2009  . SHORTNESS OF BREATH (SOB) 01/27/2009  . CROHN'S DISEASE 01/26/2009    RJori Moll  Rex Kras, PTA 07/19/2017, 11:39 AM  [place], [place], [address] Phone: [phone]   Fax:  [phone]  Name: DILON LANK MRN: [id number] Date of Birth: [birth year]

## 2017-07-20 ENCOUNTER — Ambulatory Visit (HOSPITAL_COMMUNITY): Payer: Medicare Other

## 2017-07-23 ENCOUNTER — Ambulatory Visit (HOSPITAL_COMMUNITY): Payer: Medicare Other

## 2017-07-24 ENCOUNTER — Ambulatory Visit: Payer: Medicare Other | Admitting: Physical Therapy

## 2017-07-25 ENCOUNTER — Ambulatory Visit: Payer: Medicare Other | Attending: Orthopaedic Surgery | Admitting: Physical Therapy

## 2017-07-25 ENCOUNTER — Ambulatory Visit (HOSPITAL_COMMUNITY): Payer: Medicare Other

## 2017-07-25 ENCOUNTER — Encounter: Payer: Self-pay | Admitting: Interventional Cardiology

## 2017-07-25 DIAGNOSIS — M25662 Stiffness of left knee, not elsewhere classified: Secondary | ICD-10-CM | POA: Insufficient documentation

## 2017-07-25 DIAGNOSIS — R262 Difficulty in walking, not elsewhere classified: Secondary | ICD-10-CM | POA: Insufficient documentation

## 2017-07-25 DIAGNOSIS — R252 Cramp and spasm: Secondary | ICD-10-CM | POA: Diagnosis not present

## 2017-07-25 DIAGNOSIS — M25552 Pain in left hip: Secondary | ICD-10-CM | POA: Diagnosis not present

## 2017-07-25 DIAGNOSIS — M25562 Pain in left knee: Secondary | ICD-10-CM | POA: Insufficient documentation

## 2017-07-25 NOTE — Therapy (Signed)
[facility] [address] [place], [place], [address] Phone: [phone]   Fax:  [phone]  Physical Therapy Treatment  Patient Details  Name: Chad Moss MRN: [id number] Date of Birth: June 29, 1955 Referring Provider: Jerl Santos   Encounter Date: 07/25/2017  PT End of Session - 07/25/17 1504    Visit Number  4    Date for PT Re-Evaluation  09/09/17    PT Start Time  1350    PT Stop Time  1433    PT Time Calculation (min)  43 min    Activity Tolerance  Patient tolerated treatment well    Behavior During Therapy  [facility] for tasks assessed/performed       Past Medical History:  Diagnosis Date  . Allergic rhinitis   . Anxiety   . Atelectasis   . CAD (coronary artery disease), native coronary artery    9/18 PCI/DES x1 to mLCx, mild diffuse nonobstructive disease, EF 55% on Lv gram  . Chronic bronchitis (HCC)   . COPD (chronic obstructive pulmonary disease) (HCC)   . Crohn's disease (HCC)   . Depression   . DVT (deep venous thrombosis) (HCC)    RLE X 2  . Dysphagia   . Dyspnea   . Enlarged prostate   . GERD (gastroesophageal reflux disease)   . Glaucoma, both eyes   . Heart murmur   . History of stomach ulcers 1980s  . Hyperlipidemia   . Hypertension    "off RX for years now cause of coughing w/Lisinopril" (03/13/2017)  . IBS (irritable bowel syndrome)   . Paranoid schizophrenia (HCC)   . Peripheral vascular disease (HCC)   . Pneumonia 1990s  . Pre-diabetes    "one time" (03/13/2017)  . Stroke [facility]) 04/2016   "eye stroke; left eye" (03/13/2017)  . Syncopal episodes     Past Surgical History:  Procedure Laterality Date  . ABDOMINAL HERNIA REPAIR  1990s  . COLECTOMY  1985; ?1989; 1996   "part of my ileum removed"  . CORONARY ANGIOPLASTY WITH STENT PLACEMENT  03/13/2017  . CORONARY STENT INTERVENTION N/A 03/13/2017   Procedure: CORONARY STENT INTERVENTION;  Surgeon: Corky Crafts, MD;  [facility]  INVASIVE CV LAB;  Service: Cardiovascular;  Laterality: N/A;  . EYE SURGERY Right    "laser; for glaucoma" (03/13/2017)  . FASCIOTOMY Right   . HERNIA REPAIR    . LAPAROSCOPIC CHOLECYSTECTOMY    . LEFT HEART CATH AND CORONARY ANGIOGRAPHY N/A 03/13/2017   Procedure: LEFT HEART CATH AND CORONARY ANGIOGRAPHY;  Surgeon: Corky Crafts, MD;  Location: [facility] INVASIVE CV LAB;  Service: Cardiovascular;  Laterality: N/A;  . PENILE PROSTHESIS IMPLANT  01/2011   Hattie Perch 02/01/2011  . PERIPHERAL VASCULAR CATHETERIZATION Right 1999   "had blood clots in my leg; went in to open them up; dye went into leg; ended up having a fasiotomy"  . TONSILLECTOMY      There were no vitals filed for this visit.  Subjective Assessment - 07/25/17 1352    Subjective  Patient reports some pain in the left mid to lower HS when walking    Currently in Pain?  Yes    Pain Score  3     Pain Location  Leg    Pain Orientation  Left;Posterior    Pain Descriptors / Indicators  Sore    Aggravating Factors   walking  [facility] Adult PT Treatment/Exercise - 07/25/17 0001      Ambulation/Gait   Gait Comments  minimal to no deviation with gait today      Knee/Hip Exercises: Stretches   Passive Hamstring Stretch  Left;4 reps;20 seconds    Piriformis Stretch  Left;3 reps;20 seconds      Knee/Hip Exercises: Aerobic   Recumbent Bike  level 0 x 5 minutes, some cues to keep up his speed to keep bike on    Nustep  L4 x 7 min       Knee/Hip Exercises: Machines for Strengthening   Cybex Knee Extension  10lb 2x10    Cybex Knee Flexion  20lb 3x10     Cybex Leg Press  40# 2x10      Knee/Hip Exercises: Standing   Forward Step Up  Both;1 set;10 reps;Hand Hold: 0;Step Height: 6"    Walking with Sports Cord  all directions 60'      Knee/Hip Exercises: Supine   Other Supine Knee/Hip Exercises  feet on ball K2C, small bridges               PT Short Term Goals - 07/19/17 1137      PT  SHORT TERM GOAL #1   Title  independent with initial HEP    Status  Achieved        PT Long Term Goals - 07/25/17 1506      PT LONG TERM GOAL #2   Title  able to walk without difficulty    Status  Achieved            Plan - 07/25/17 1504    Clinical Impression Statement  Patient overall doing much better, minimal to no deviation with gait.  He is now reporting pain more mid to distal posterior thigh, some lateral tenderness probably from ITB tightness.      PT Next Visit Plan  continue to add exercises as he tolerates    Consulted and Agree with Plan of Care  Patient       Patient will benefit from skilled therapeutic intervention in order to improve the following deficits and impairments:  Abnormal gait, Decreased range of motion, Difficulty walking, Increased muscle spasms, Decreased activity tolerance, Pain, Decreased balance, Impaired flexibility, Postural dysfunction, Decreased strength, Decreased mobility  Visit Diagnosis: Acute pain of left knee  Stiffness of left knee, not elsewhere classified  Pain in left hip  Cramp and spasm  Difficulty in walking, not elsewhere classified     Problem List Patient Active Problem List   Diagnosis Date Noted  . Essential hypertension   . Coronary artery disease involving native coronary artery of native heart with angina pectoris (HCC) 03/13/2017  . Coronary artery calcification seen on CAT scan 01/12/2017  . Chronic obstructive pulmonary disease, unspecified copd, unspecified chronic bronchitis type 09/13/2015  . Cough 08/09/2015  . Flu-like symptoms 08/09/2015  . Acute sinusitis 08/09/2015  . AP (abdominal pain) 07/09/2014  . COLD (chronic obstructive lung disease) (HCC) 07/09/2014  . COPD exacerbation (HCC) 06/01/2014  . Other malaise and fatigue 09/06/2013  . Encounter for screening for lung cancer 10/09/2012  . Chronic cough 11/19/2011  . Tobacco abuse 04/06/2011  . Mixed hyperlipidemia 01/27/2009  .  HYPERTENSION 01/27/2009  . PERIPHERAL VASCULAR DISEASE 01/27/2009  . ALLERGIC RHINITIS 01/27/2009  . Other emphysema (HCC) 01/27/2009  . DIZZINESS, CHRONIC 01/27/2009  . SHORTNESS OF BREATH (SOB) 01/27/2009  . CROHN'S DISEASE 01/26/2009    Jearld Lesch., PT 07/25/2017, 3:07 PM  [facility] [address] [place], [place], [address] Phone: [phone]   Fax:  [phone]  Name: Chad Moss MRN: [id number] Date of Birth: [birth year]

## 2017-07-30 ENCOUNTER — Other Ambulatory Visit: Payer: Self-pay | Admitting: Internal Medicine

## 2017-08-01 ENCOUNTER — Ambulatory Visit: Payer: Medicare Other | Admitting: Physical Therapy

## 2017-08-01 ENCOUNTER — Encounter: Payer: Self-pay | Admitting: Interventional Cardiology

## 2017-08-01 ENCOUNTER — Ambulatory Visit (INDEPENDENT_AMBULATORY_CARE_PROVIDER_SITE_OTHER): Payer: Medicare Other | Admitting: Interventional Cardiology

## 2017-08-01 ENCOUNTER — Encounter: Payer: Self-pay | Admitting: Physical Therapy

## 2017-08-01 VITALS — BP 116/80 | HR 92 | Ht 71.0 in | Wt 206.4 lb

## 2017-08-01 DIAGNOSIS — Z720 Tobacco use: Secondary | ICD-10-CM | POA: Diagnosis not present

## 2017-08-01 DIAGNOSIS — I10 Essential (primary) hypertension: Secondary | ICD-10-CM | POA: Diagnosis not present

## 2017-08-01 DIAGNOSIS — R252 Cramp and spasm: Secondary | ICD-10-CM | POA: Diagnosis not present

## 2017-08-01 DIAGNOSIS — M25552 Pain in left hip: Secondary | ICD-10-CM

## 2017-08-01 DIAGNOSIS — R262 Difficulty in walking, not elsewhere classified: Secondary | ICD-10-CM | POA: Diagnosis not present

## 2017-08-01 DIAGNOSIS — M25562 Pain in left knee: Secondary | ICD-10-CM | POA: Diagnosis not present

## 2017-08-01 DIAGNOSIS — E782 Mixed hyperlipidemia: Secondary | ICD-10-CM

## 2017-08-01 DIAGNOSIS — I25119 Atherosclerotic heart disease of native coronary artery with unspecified angina pectoris: Secondary | ICD-10-CM

## 2017-08-01 DIAGNOSIS — I739 Peripheral vascular disease, unspecified: Secondary | ICD-10-CM

## 2017-08-01 DIAGNOSIS — M25662 Stiffness of left knee, not elsewhere classified: Secondary | ICD-10-CM | POA: Diagnosis not present

## 2017-08-01 MED ORDER — AMLODIPINE BESYLATE 5 MG PO TABS: 5.0000 mg | ORAL_TABLET | Freq: Every day | ORAL | 3 refills | Status: DC

## 2017-08-01 MED ORDER — PANTOPRAZOLE SODIUM 40 MG PO TBEC: 40.0000 mg | DELAYED_RELEASE_TABLET | Freq: Two times a day (BID) | ORAL | 6 refills | Status: DC

## 2017-08-01 MED ORDER — CLOPIDOGREL BISULFATE 75 MG PO TABS: 75.0000 mg | ORAL_TABLET | Freq: Every day | ORAL | 3 refills | Status: DC

## 2017-08-01 MED ORDER — ISOSORBIDE MONONITRATE ER 30 MG PO TB24: 30.0000 mg | ORAL_TABLET | Freq: Every day | ORAL | 3 refills | Status: DC

## 2017-08-01 MED ORDER — POTASSIUM CHLORIDE ER 10 MEQ PO TBCR: 10.0000 meq | EXTENDED_RELEASE_TABLET | Freq: Two times a day (BID) | ORAL | 3 refills | Status: DC

## 2017-08-01 MED ORDER — NITROGLYCERIN 0.4 MG SL SUBL: SUBLINGUAL_TABLET | SUBLINGUAL | 6 refills | Status: DC

## 2017-08-01 NOTE — Patient Instructions (Signed)
Medication Instructions:  Your physician recommends that you continue on your current medications as directed. Please refer to the Current Medication list given to you today.   Labwork: None ordered  Testing/Procedures: None ordered  Follow-Up: Your physician wants you to follow-up in: 6 months with Dr. Eldridge Dace. You will receive a reminder letter in the mail two months in advance. If you don't receive a letter, please call our office to schedule the follow-up appointment.   Any Other Special Instructions Will Be Listed Below (If Applicable).  1-800-QUIT NOW  Steps to Quit Smoking Smoking tobacco can be bad for your health. It can also affect almost every organ in your body. Smoking puts you and people around you at risk for many serious long-lasting (chronic) diseases. Quitting smoking is hard, but it is one of the best things that you can do for your health. It is never too late to quit. What are the benefits of quitting smoking? When you quit smoking, you lower your risk for getting serious diseases and conditions. They can include:  Lung cancer or lung disease.  Heart disease.  Stroke.  Heart attack.  Not being able to have children (infertility).  Weak bones (osteoporosis) and broken bones (fractures).  If you have coughing, wheezing, and shortness of breath, those symptoms may get better when you quit. You may also get sick less often. If you are pregnant, quitting smoking can help to lower your chances of having a baby of low birth weight. What can I do to help me quit smoking? Talk with your doctor about what can help you quit smoking. Some things you can do (strategies) include:  Quitting smoking totally, instead of slowly cutting back how much you smoke over a period of time.  Going to in-person counseling. You are more likely to quit if you go to many counseling sessions.  Using resources and support systems, such as: ? Agricultural engineer with a Veterinary surgeon. ? Phone  quitlines. ? Automotive engineer. ? Support groups or group counseling. ? Text messaging programs. ? Mobile phone apps or applications.  Taking medicines. Some of these medicines may have nicotine in them. If you are pregnant or breastfeeding, do not take any medicines to quit smoking unless your doctor says it is okay. Talk with your doctor about counseling or other things that can help you.  Talk with your doctor about using more than one strategy at the same time, such as taking medicines while you are also going to in-person counseling. This can help make quitting easier. What things can I do to make it easier to quit? Quitting smoking might feel very hard at first, but there is a lot that you can do to make it easier. Take these steps:  Talk to your family and friends. Ask them to support and encourage you.  Call phone quitlines, reach out to support groups, or work with a Veterinary surgeon.  Ask people who smoke to not smoke around you.  Avoid places that make you want (trigger) to smoke, such as: ? Bars. ? Parties. ? Smoke-break areas at work.  Spend time with people who do not smoke.  Lower the stress in your life. Stress can make you want to smoke. Try these things to help your stress: ? Getting regular exercise. ? Deep-breathing exercises. ? Yoga. ? Meditating. ? Doing a body scan. To do this, close your eyes, focus on one area of your body at a time from head to toe, and notice which parts of  your body are tense. Try to relax the muscles in those areas.  Download or buy apps on your mobile phone or tablet that can help you stick to your quit plan. There are many free apps, such as QuitGuide from the [employer] developer(Centers for Disease Control and Prevention). You can find more support from smokefree.gov and other websites.  This information is not intended to replace advice given to you by your health care provider. Make sure you discuss any questions you have with your health care  provider. Document Released: 04/01/2009 Document Revised: 02/01/2016 Document Reviewed: 10/20/2014 Elsevier Interactive Patient Education  [employer]2018 Elsevier Inc.    If you need a refill on your cardiac medications before your next appointment, please call your pharmacy.

## 2017-08-01 NOTE — Progress Notes (Signed)
Cardiology Office Note   Date:  08/01/2017   ID:  Chad Moss, DOB 1955/10/15, MRN [id number]  PCP:  Glendale Chard, MD    No chief complaint on file. CAD   Wt Readings from Last 3 Encounters:  08/01/17 206 lb 6.4 oz (93.6 kg)  05/29/17 206 lb (93.4 kg)  05/01/17 206 lb 12 oz (93.8 kg)       History of Present Illness: Chad Moss is a 62 y.o. male  With CAD, after CT showed coronary calcification.  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  Long h/o tobacco abuse.    Cath on 03/13/17 showed:   The left ventricular systolic function is normal.  LV end diastolic pressure is normal.  The left ventricular ejection fraction is 55-65% by visual estimate.  There is no aortic valve stenosis.  Mid Cx lesion, 75 %stenosed.  A STENT RESOLUTE ONYX [id number] drug eluting stent was successfully placed.  Post intervention, there is a 0% residual stenosis.  Unable to use right radial artery due to high bifurcation of radial and ulnar arteries.  He was advised to stop smoking.  He has been unable to quit and has not cut back either.  Denies : Chest pain. Dizziness. Leg edema. Nitroglycerin use. Orthopnea. Palpitations. Paroxysmal nocturnal dyspnea. Shortness of breath. Syncope.   He has been exercising with PT.  He has to do PT for his legs.           Past Medical History:  Diagnosis Date  . Allergic rhinitis   . Anxiety   . Atelectasis   . CAD (coronary artery disease), native coronary artery    9/18 PCI/DES x1 to mLCx, mild diffuse nonobstructive disease, EF 55% on Lv gram  . Chronic bronchitis ([place])   . COPD (chronic obstructive pulmonary disease) ([place])   . Crohn's disease ([place])   . Depression   . DVT (deep venous thrombosis) (HCC)    RLE X 2  . Dysphagia   . Dyspnea   . Enlarged prostate   . GERD (gastroesophageal reflux disease)   . Glaucoma, both eyes   . Heart murmur   . History of stomach ulcers 1980s  . Hyperlipidemia   . Hypertension    "off RX for years now cause of coughing w/Lisinopril" (03/13/2017)  . IBS (irritable bowel syndrome)   . Paranoid schizophrenia ([place])   . Peripheral vascular disease ([place])   . Pneumonia 1990s  . Pre-diabetes    "one time" (03/13/2017)  . Stroke [facility]) 04/2016   "eye stroke; left eye" (03/13/2017)  . Syncopal episodes     Past Surgical History:  Procedure Laterality Date  . ABDOMINAL HERNIA REPAIR  1990s  . COLECTOMY  1985; ?1989; 1996   "part of my ileum removed"  . CORONARY ANGIOPLASTY WITH STENT PLACEMENT  03/13/2017  . CORONARY STENT INTERVENTION N/A 03/13/2017   Procedure: CORONARY STENT INTERVENTION;  Surgeon: Jettie Booze, MD;  Location: [place] CV LAB;  Service: Cardiovascular;  Laterality: N/A;  . EYE SURGERY Right    "laser; for glaucoma" (03/13/2017)  . FASCIOTOMY Right   . HERNIA REPAIR    . LAPAROSCOPIC CHOLECYSTECTOMY    . LEFT HEART CATH AND CORONARY ANGIOGRAPHY N/A 03/13/2017   Procedure: LEFT HEART CATH AND CORONARY ANGIOGRAPHY;  Surgeon: Jettie Booze, MD;  Location: [place] CV LAB;  Service: Cardiovascular;  Laterality: N/A;  . PENILE PROSTHESIS IMPLANT  01/2011   Archie Endo 02/01/2011  . PERIPHERAL VASCULAR CATHETERIZATION  Right 1999   "had blood clots in my leg; went in to open them up; dye went into leg; ended up having a fasiotomy"  . TONSILLECTOMY       Current Outpatient Medications  Medication Sig Dispense Refill  . albuterol (PROVENTIL) (2.5 MG/3ML) 0.083% nebulizer solution Take 3 mLs (2.5 mg total) by nebulization every 6 (six) hours as needed for wheezing or shortness of breath. 120 mL 12  . alfuzosin (UROXATRAL) 10 MG 24 hr tablet Take 10 mg by mouth at bedtime.     Marland Kitchen amLODipine (NORVASC) 5 MG tablet Take 1 tablet (5 mg total) by mouth daily. 90 tablet 1  . aspirin EC 81 MG tablet Take 81 mg by mouth daily.    Marland Kitchen azithromycin (ZITHROMAX) 250 MG tablet Take 2 tablets on first day, then 1 tablet daily until finished. 6 tablet 0  .  bimatoprost (LUMIGAN) 0.01 % SOLN Place 1 drop into the left eye at bedtime.    . brimonidine (ALPHAGAN) 0.15 % ophthalmic solution Place 1 drop into the left eye 2 (two) times daily.     . cholestyramine (QUESTRAN) 4 G packet Take 1 packet by mouth daily.     . clonazePAM (KLONOPIN) 1 MG tablet Take 1 mg by mouth at bedtime. Anxiety    . clopidogrel (PLAVIX) 75 MG tablet Take 1 tablet (75 mg total) by mouth daily with breakfast. 90 tablet 1  . dicyclomine (BENTYL) 10 MG capsule Take 10 mg by mouth 4 (four) times daily -  before meals and at bedtime.     . diphenhydrAMINE (BENADRYL) 25 MG tablet Take 50 mg by mouth at bedtime.     Marland Kitchen esomeprazole (NEXIUM) 20 MG capsule Take 20 mg by mouth daily.    . finasteride (PROSCAR) 5 MG tablet Take 5 mg by mouth daily.    Marland Kitchen loxapine (LOXITANE) 10 MG capsule Take 10 mg by mouth 2 (two) times daily. 1 tab in AM and 2 tabs in PM    . mercaptopurine (PURINETHOL) 50 MG tablet Take 75 mg by mouth daily. 1 tablet and a half tablet Give on an empty stomach 1 hour before or 2 hours after meals. Caution: Chemotherapy.    . mesalamine (PENTASA) 250 MG CR capsule Take 1,000 mg by mouth 4 (four) times daily.     . methocarbamol (ROBAXIN) 500 MG tablet Take 1 tablet (500 mg total) by mouth 2 (two) times daily. 20 tablet 0  . mirabegron ER (MYRBETRIQ) 50 MG TB24 tablet Take 50 mg by mouth daily.    . mirtazapine (REMERON) 30 MG tablet Take 30 mg by mouth at bedtime.     . nitroGLYCERIN (NITROSTAT) 0.4 MG SL tablet DISSOLVE 1 TABLET UNDER THE TONGUE EVERY 5 MINUTES AS NEEDED 25 tablet 6  . ondansetron (ZOFRAN) 8 MG tablet Take 8 mg by mouth 2 (two) times daily.    . pantoprazole (PROTONIX) 40 MG tablet Take 1 tablet (40 mg total) by mouth 2 (two) times daily. 60 tablet 6  . potassium chloride (K-DUR) 10 MEQ tablet Take 10 mEq by mouth 2 (two) times daily.    . predniSONE (DELTASONE) 10 MG tablet Take 4 tabs x1 day, 3 tabs x1 day, 2 tabs x1 day, 1 tab x1 day and 1/2 tab x1  then stop. 11 tablet 0  . PROAIR HFA 108 (90 Base) MCG/ACT inhaler INHALE 2 PUFFS BY MOUTH INTO THE LUNGS EVERY 6 HOURS AS NEEDED FOR WHEEZING OR SHORTNESS OF BREATH 8.5  g 5  . SPIRIVA HANDIHALER 18 MCG inhalation capsule INHALE THE CONTENT OF 1 CAPSULE VIA HANDIHALER DAILY 30 capsule 0  . timolol (TIMOPTIC) 0.5 % ophthalmic solution Place 1 drop into the left eye 2 (two) times daily.    . traZODone (DESYREL) 50 MG tablet Take 150 mg by mouth at bedtime.    . isosorbide mononitrate (IMDUR) 30 MG 24 hr tablet Take 1 tablet (30 mg total) by mouth daily. 30 tablet 6   No current facility-administered medications for this visit.     Allergies:   Benztropine; Codeine; Meperidine; Cyclobenzaprine; Penicillins; Sulfamethoxazole-trimethoprim; and Sulfonamide derivatives    Social History:  The patient  reports that he has been smoking cigarettes.  He has a 47.00 pack-year smoking history. he has never used smokeless tobacco. He reports that he does not drink alcohol or use drugs.   Family History:  The patient's family history includes Heart disease in his father and mother; Hypertension in his father and mother; Lung cancer in his brother.    ROS:  Please see the history of present illness.   Otherwise, review of systems are positive for leg pain.   All other systems are reviewed and negative.    PHYSICAL EXAM: VS:  BP 116/80   Pulse 92   Ht 5' 11"  (1.803 m)   Wt 206 lb 6.4 oz (93.6 kg)   SpO2 96%   BMI 28.79 kg/m  , BMI Body mass index is 28.79 kg/m. GEN: Well nourished, well developed, in no acute distress  HEENT: normal  Neck: no JVD, carotid bruits, or masses Cardiac: RRR; no murmurs, rubs, or gallops,no edema  Respiratory:  clear to auscultation bilaterally, normal work of breathing GI: soft, nontender, nondistended, + BS MS: no deformity or atrophy  Skin: warm and dry, no rash Neuro:  Strength and sensation are intact Psych: euthymic mood, full affect     Recent  Labs: 05/29/2017: BUN 7; Creatinine, Ser 1.01; Hemoglobin 13.4; Platelets 218; Potassium 3.5; Sodium 139   Lipid Panel    Component Value Date/Time   CHOL 150 05/17/2017 0827   TRIG 199 (H) 05/17/2017 0827   HDL 37 (L) 05/17/2017 0827   CHOLHDL 4.1 05/17/2017 0827   LDLCALC 73 05/17/2017 0827     Other studies Reviewed: Additional studies/ records that were reviewed today with results demonstrating: labs reviewed.   ASSESSMENT AND PLAN:  1. CAD: No angina. Continue aggressive secondary prevention.  Needs refills. Is compliant with Plavix.  Will need to look at statin therapy for him going forward.   He was on atorvastatin 40 mg daily.  2. Hyperlipidemia: LDL 73 in 11/18.  Continue lipid lowering therapy.  3. CVA: Affected left eye- caused blindness. 4. HTN: No AAA in 2015 by CT scan.  COntinue amlodipine.  5. Tobacco abuse: Unable to stop smoking. I stressed the importance of stopping smoking.    Current medicines are reviewed at length with the patient today.  The patient concerns regarding his medicines were addressed.  The following changes have been made:  No change  Labs/ tests ordered today include:  No orders of the defined types were placed in this encounter.   Recommend 150 minutes/week of aerobic exercise Low fat, low carb, high fiber diet recommended  Disposition:   FU in 6 months   Signed, Larae Grooms, MD  08/01/2017 12:01 PM    [facility] [address], [place], [place]  [address] Phone: [phone]; Fax: [phone]

## 2017-08-01 NOTE — Therapy (Signed)
[facility] [address] [place], [place], [address] Phone: [phone]   Fax:  [phone]  Physical Therapy Treatment  Patient Details  Name: Chad Moss MRN: [id number] Date of Birth: [birth year] Referring Provider: Jerl Santos   Encounter Date: 08/01/2017  PT End of Session - 08/01/17 1436    Visit Number  5    Date for PT Re-Evaluation  09/09/17    PT Start Time  1350    PT Stop Time  1443    PT Time Calculation (min)  53 min    Activity Tolerance  Patient tolerated treatment well    Behavior During Therapy  [facility] for tasks assessed/performed       Past Medical History:  Diagnosis Date  . Allergic rhinitis   . Anxiety   . Atelectasis   . CAD (coronary artery disease), native coronary artery    9/18 PCI/DES x1 to mLCx, mild diffuse nonobstructive disease, EF 55% on Lv gram  . Chronic bronchitis (HCC)   . COPD (chronic obstructive pulmonary disease) (HCC)   . Crohn's disease (HCC)   . Depression   . DVT (deep venous thrombosis) (HCC)    RLE X 2  . Dysphagia   . Dyspnea   . Enlarged prostate   . GERD (gastroesophageal reflux disease)   . Glaucoma, both eyes   . Heart murmur   . History of stomach ulcers 1980s  . Hyperlipidemia   . Hypertension    "off RX for years now cause of coughing w/Lisinopril" (03/13/2017)  . IBS (irritable bowel syndrome)   . Paranoid schizophrenia (HCC)   . Peripheral vascular disease (HCC)   . Pneumonia 1990s  . Pre-diabetes    "one time" (03/13/2017)  . Stroke [facility]) 04/2016   "eye stroke; left eye" (03/13/2017)  . Syncopal episodes     Past Surgical History:  Procedure Laterality Date  . ABDOMINAL HERNIA REPAIR  1990s  . COLECTOMY  1985; ?1989; 1996   "part of my ileum removed"  . CORONARY ANGIOPLASTY WITH STENT PLACEMENT  03/13/2017  . CORONARY STENT INTERVENTION N/A 03/13/2017   Procedure: CORONARY STENT INTERVENTION;  Surgeon: Corky Crafts, MD;  Location: [facility]  INVASIVE CV LAB;  Service: Cardiovascular;  Laterality: N/A;  . EYE SURGERY Right    "laser; for glaucoma" (03/13/2017)  . FASCIOTOMY Right   . HERNIA REPAIR    . LAPAROSCOPIC CHOLECYSTECTOMY    . LEFT HEART CATH AND CORONARY ANGIOGRAPHY N/A 03/13/2017   Procedure: LEFT HEART CATH AND CORONARY ANGIOGRAPHY;  Surgeon: Corky Crafts, MD;  Location: [facility] INVASIVE CV LAB;  Service: Cardiovascular;  Laterality: N/A;  . PENILE PROSTHESIS IMPLANT  01/2011   Hattie Perch 02/01/2011  . PERIPHERAL VASCULAR CATHETERIZATION Right 1999   "had blood clots in my leg; went in to open them up; dye went into leg; ended up having a fasiotomy"  . TONSILLECTOMY      There were no vitals filed for this visit.  Subjective Assessment - 08/01/17 1358    Subjective  Pt reports that he feels great, but his leg still hurts a little    Currently in Pain?  Yes    Pain Score  3     Pain Location  Leg    Pain Orientation  Left;Posterior                      [facility] Adult PT Treatment/Exercise - 08/01/17 0001  Knee/Hip Exercises: Aerobic   Nustep  L4 x 7 min       Knee/Hip Exercises: Machines for Strengthening   Cybex Knee Extension  10lb 2x10    Cybex Knee Flexion  20lb 2x10     Cybex Leg Press  40# 2x10      Knee/Hip Exercises: Standing   Forward Step Up  Both;1 set;10 reps;Hand Hold: 0;Step Height: 6"    Walking with Sports Cord  all directions 60'    Other Standing Knee Exercises  standing march 2x10       Moist Heat Therapy   Number Minutes Moist Heat  10 Minutes    Moist Heat Location  Knee posterior               PT Short Term Goals - 07/19/17 1137      PT SHORT TERM GOAL #1   Title  independent with initial HEP    Status  Achieved        PT Long Term Goals - 07/25/17 1506      PT LONG TERM GOAL #2   Title  able to walk without difficulty    Status  Achieved            Plan - 08/01/17 1436    Clinical Impression Statement  Pt reports some pain in the  distal posterior L thigh with resisted backwards walking. Tight bilat hs noted with passive stretching.     Rehab Potential  Good    PT Frequency  2x / week    PT Duration  8 weeks    PT Treatment/Interventions  ADLs/Self Care Home Management;Cryotherapy;Electrical Stimulation;Moist Heat;Iontophoresis 4mg /ml Dexamethasone;Gait training;Stair training;Functional mobility training;Therapeutic activities;Therapeutic exercise;Balance training;Neuromuscular re-education;Manual techniques;Patient/family education    PT Next Visit Plan  continue to add exercises as he tolerates       Patient will benefit from skilled therapeutic intervention in order to improve the following deficits and impairments:  Abnormal gait, Decreased range of motion, Difficulty walking, Increased muscle spasms, Decreased activity tolerance, Pain, Decreased balance, Impaired flexibility, Postural dysfunction, Decreased strength, Decreased mobility  Visit Diagnosis: Acute pain of left knee  Stiffness of left knee, not elsewhere classified  Pain in left hip     Problem List Patient Active Problem List   Diagnosis Date Noted  . Essential hypertension   . Coronary artery disease involving native coronary artery of native heart with angina pectoris (HCC) 03/13/2017  . Coronary artery calcification seen on CAT scan 01/12/2017  . Chronic obstructive pulmonary disease, unspecified copd, unspecified chronic bronchitis type 09/13/2015  . Cough 08/09/2015  . Flu-like symptoms 08/09/2015  . Acute sinusitis 08/09/2015  . AP (abdominal pain) 07/09/2014  . COLD (chronic obstructive lung disease) (HCC) 07/09/2014  . COPD exacerbation (HCC) 06/01/2014  . Other malaise and fatigue 09/06/2013  . Encounter for screening for lung cancer 10/09/2012  . Chronic cough 11/19/2011  . Tobacco abuse 04/06/2011  . Mixed hyperlipidemia 01/27/2009  . HYPERTENSION 01/27/2009  . PERIPHERAL VASCULAR DISEASE 01/27/2009  . ALLERGIC RHINITIS  01/27/2009  . Other emphysema (HCC) 01/27/2009  . DIZZINESS, CHRONIC 01/27/2009  . SHORTNESS OF BREATH (SOB) 01/27/2009  . CROHN'S DISEASE 01/26/2009    Grayce Sessions, PTA 08/01/2017, 2:38 PM  [facility] [address] [place], [place], [address] Phone: [phone]   Fax:  [phone]  Name: CHIA WHEELEY MRN: [id number] Date of Birth: 1955/08/29

## 2017-08-02 ENCOUNTER — Telehealth: Payer: Self-pay

## 2017-08-02 DIAGNOSIS — E785 Hyperlipidemia, unspecified: Secondary | ICD-10-CM

## 2017-08-02 MED ORDER — ROSUVASTATIN CALCIUM 20 MG PO TABS: 20.0000 mg | ORAL_TABLET | Freq: Every day | ORAL | 3 refills | Status: DC

## 2017-08-02 NOTE — Telephone Encounter (Signed)
-----   Message from Corky Crafts, MD sent at 08/01/2017 12:26 PM EST ----- [place], He was on atorvastatin 40 in the past, but this is not on his list.  Can you check if he is taking this? THanks.Marland Kitchen JV

## 2017-08-02 NOTE — Telephone Encounter (Signed)
Called and spoke to patient and his caretaker Cherly Hensen yesterday to see if the patient was taking atorvastatin 40 mg QD. She stated that she thought that he was but was going to check when they got home and call back.  Spoke with the patient and Cherly Hensen today and she states that he did go back to taking the atorvastatin 40 mg QD but his leg pain came back. They did a trial of the rosuvastatin 20 mg QD back in November (see phone note 05/15/17). The patient states that his leg pain was better on the rosuvastatin. Made patient aware that I will send in Rx for rosuvastatin 20 mg QD and we will recheck LIPID and LFTs in 3 months.

## 2017-08-03 DIAGNOSIS — H5310 Unspecified subjective visual disturbances: Secondary | ICD-10-CM | POA: Diagnosis not present

## 2017-08-03 DIAGNOSIS — H3412 Central retinal artery occlusion, left eye: Secondary | ICD-10-CM | POA: Diagnosis not present

## 2017-08-03 DIAGNOSIS — H2513 Age-related nuclear cataract, bilateral: Secondary | ICD-10-CM | POA: Diagnosis not present

## 2017-08-03 DIAGNOSIS — H4321 Crystalline deposits in vitreous body, right eye: Secondary | ICD-10-CM | POA: Diagnosis not present

## 2017-08-08 ENCOUNTER — Ambulatory Visit: Payer: Medicare Other | Admitting: Physical Therapy

## 2017-08-08 ENCOUNTER — Encounter: Payer: Self-pay | Admitting: Physical Therapy

## 2017-08-08 DIAGNOSIS — R262 Difficulty in walking, not elsewhere classified: Secondary | ICD-10-CM | POA: Diagnosis not present

## 2017-08-08 DIAGNOSIS — M25552 Pain in left hip: Secondary | ICD-10-CM | POA: Diagnosis not present

## 2017-08-08 DIAGNOSIS — M25562 Pain in left knee: Secondary | ICD-10-CM

## 2017-08-08 DIAGNOSIS — M25662 Stiffness of left knee, not elsewhere classified: Secondary | ICD-10-CM | POA: Diagnosis not present

## 2017-08-08 DIAGNOSIS — R252 Cramp and spasm: Secondary | ICD-10-CM | POA: Diagnosis not present

## 2017-08-08 NOTE — Therapy (Signed)
[place], [place], [address] Phone: [phone]   Fax:  [phone]  Physical Therapy Treatment  Patient Details  Name: Chad Moss MRN: [id number] Date of Birth: [birth year] Referring Provider: Rhona Raider   Encounter Date: 08/08/2017  PT End of Session - 08/08/17 2229    Visit Number  6    Date for PT Re-Evaluation  09/09/17    PT Start Time  1509    PT Stop Time  1602    PT Time Calculation (min)  53 min    Activity Tolerance  Patient tolerated treatment well    Behavior During Therapy  [facility] for tasks assessed/performed       Past Medical History:  Diagnosis Date  . Allergic rhinitis   . Anxiety   . Atelectasis   . CAD (coronary artery disease), native coronary artery    9/18 PCI/DES x1 to mLCx, mild diffuse nonobstructive disease, EF 55% on Lv gram  . Chronic bronchitis ([place])   . COPD (chronic obstructive pulmonary disease) ([place])   . Crohn's disease ([place])   . Depression   . DVT (deep venous thrombosis) (HCC)    RLE X 2  . Dysphagia   . Dyspnea   . Enlarged prostate   . GERD (gastroesophageal reflux disease)   . Glaucoma, both eyes   . Heart murmur   . History of stomach ulcers 1980s  . Hyperlipidemia   . Hypertension    "off RX for years now cause of coughing w/Lisinopril" (03/13/2017)  . IBS (irritable bowel syndrome)   . Paranoid schizophrenia ([place])   . Peripheral vascular disease ([place])   . Pneumonia 1990s  . Pre-diabetes    "one time" (03/13/2017)  . Stroke [facility]) 04/2016   "eye stroke; left eye" (03/13/2017)  . Syncopal episodes     Past Surgical History:  Procedure Laterality Date  . ABDOMINAL HERNIA REPAIR  1990s  . COLECTOMY  1985; ?1989; 1996   "part of my ileum removed"  . CORONARY ANGIOPLASTY WITH STENT PLACEMENT  03/13/2017  . CORONARY STENT INTERVENTION N/A 03/13/2017   Procedure: CORONARY STENT INTERVENTION;  Surgeon: Jettie Booze, MD;  Location: [place] CV LAB;  Service: Cardiovascular;  Laterality: N/A;  . EYE SURGERY Right    "laser; for glaucoma" (03/13/2017)  . FASCIOTOMY Right   . HERNIA REPAIR    . LAPAROSCOPIC CHOLECYSTECTOMY    . LEFT HEART CATH AND CORONARY ANGIOGRAPHY N/A 03/13/2017   Procedure: LEFT HEART CATH AND CORONARY ANGIOGRAPHY;  Surgeon: Jettie Booze, MD;  Location: [place] CV LAB;  Service: Cardiovascular;  Laterality: N/A;  . PENILE PROSTHESIS IMPLANT  01/2011   Archie Endo 02/01/2011  . PERIPHERAL VASCULAR CATHETERIZATION Right 1999   "had blood clots in my leg; went in to open them up; dye went into leg; ended up having a fasiotomy"  . TONSILLECTOMY      There were no vitals filed for this visit.  Subjective Assessment - 08/08/17 1507    Subjective  Pt reports that's he feels good today    Currently in Pain?  Yes    Pain Score  2     Pain Location  Leg    Pain Orientation  Left                      [facility] Adult PT Treatment/Exercise - 08/08/17 0001      Knee/Hip Exercises: Aerobic   Recumbent  Bike  X4 min     Nustep  L4 x 7 min       Knee/Hip Exercises: Machines for Strengthening   Cybex Knee Extension  10lb 2x10    Cybex Knee Flexion  20lb 2x15    Cybex Leg Press  40# 2x10      Knee/Hip Exercises: Standing   Forward Step Up  Both;1 set;10 reps;Hand Hold: 0;Step Height: 6"    Walking with Sports Cord  all directions 60'    Other Standing Knee Exercises  standing march 2x10                PT Short Term Goals - 07/19/17 1137      PT SHORT TERM GOAL #1   Title  independent with initial HEP    Status  Achieved        PT Long Term Goals - 08/08/17 1553      PT LONG TERM GOAL #1   Title  decrease pain 50%    Status  Partially Met      PT LONG TERM GOAL #2   Title  able to walk without difficulty    Status  Achieved      PT LONG TERM GOAL #3   Title  sit without difficulty    Status  Partially Met      PT LONG TERM GOAL #4   Title  increase SLR to  45 degrees    Status  On-going      PT LONG TERM GOAL #5   Title  increase left hip and knee strength to 4/5    Status  On-going            Plan - 08/08/17 1555    Clinical Impression Statement  Pt reports that his legs feel better overall. Some instability with backwards walking against resistance. Pt does well machine level interventions.     PT Frequency  2x / week    PT Duration  8 weeks    PT Treatment/Interventions  ADLs/Self Care Home Management;Cryotherapy;Electrical Stimulation;Moist Heat;Iontophoresis 23m/ml Dexamethasone;Gait training;Stair training;Functional mobility training;Therapeutic activities;Therapeutic exercise;Balance training;Neuromuscular re-education;Manual techniques;Patient/family education    PT Next Visit Plan  continue to add exercises as he tolerates       Patient will benefit from skilled therapeutic intervention in order to improve the following deficits and impairments:  Abnormal gait, Decreased range of motion, Difficulty walking, Increased muscle spasms, Decreased activity tolerance, Pain, Decreased balance, Impaired flexibility, Postural dysfunction, Decreased strength, Decreased mobility  Visit Diagnosis: Acute pain of left knee  Stiffness of left knee, not elsewhere classified  Pain in left hip     Problem List Patient Active Problem List   Diagnosis Date Noted  . Essential hypertension   . Coronary artery disease involving native coronary artery of native heart with angina pectoris ([place] 03/13/2017  . Coronary artery calcification seen on CAT scan 01/12/2017  . Chronic obstructive pulmonary disease, unspecified copd, unspecified chronic bronchitis type 09/13/2015  . Cough 08/09/2015  . Flu-like symptoms 08/09/2015  . Acute sinusitis 08/09/2015  . AP (abdominal pain) 07/09/2014  . COLD (chronic obstructive lung disease) ([place] 07/09/2014  . COPD exacerbation ([place] 06/01/2014  . Other malaise and fatigue 09/06/2013  . Encounter for  screening for lung cancer 10/09/2012  . Chronic cough 11/19/2011  . Tobacco abuse 04/06/2011  . Mixed hyperlipidemia 01/27/2009  . HYPERTENSION 01/27/2009  . PERIPHERAL VASCULAR DISEASE 01/27/2009  . ALLERGIC RHINITIS 01/27/2009  . Other emphysema ([place] 01/27/2009  .  DIZZINESS, CHRONIC 01/27/2009  . SHORTNESS OF BREATH (SOB) 01/27/2009  . CROHN'S DISEASE 01/26/2009    Scot Jun, PTA 08/08/2017, 3:59 PM  [place], [place], [address] Phone: [phone]   Fax:  [phone]  Name: BRANDAN ROBICHEAUX MRN: [id number] Date of Birth: [birth year]

## 2017-08-15 ENCOUNTER — Ambulatory Visit: Payer: Medicare Other | Admitting: Physical Therapy

## 2017-08-15 ENCOUNTER — Encounter: Payer: Self-pay | Admitting: Physical Therapy

## 2017-08-15 DIAGNOSIS — M25562 Pain in left knee: Secondary | ICD-10-CM | POA: Diagnosis not present

## 2017-08-15 DIAGNOSIS — M25662 Stiffness of left knee, not elsewhere classified: Secondary | ICD-10-CM

## 2017-08-15 DIAGNOSIS — R252 Cramp and spasm: Secondary | ICD-10-CM | POA: Diagnosis not present

## 2017-08-15 DIAGNOSIS — R262 Difficulty in walking, not elsewhere classified: Secondary | ICD-10-CM | POA: Diagnosis not present

## 2017-08-15 DIAGNOSIS — M25552 Pain in left hip: Secondary | ICD-10-CM

## 2017-08-15 NOTE — Therapy (Signed)
[place], [place], [address] Phone: [phone]   Fax:  [phone]  Physical Therapy Treatment  Patient Details  Name: Chad Moss MRN: [id number] Date of Birth: 07/21/1955 Referring Provider: Rhona Raider   Encounter Date: 08/15/2017  PT End of Session - 08/15/17 1553    Visit Number  7    Date for PT Re-Evaluation  09/09/17    PT Start Time  1514    PT Stop Time  1555    PT Time Calculation (min)  41 min    Activity Tolerance  Patient tolerated treatment well    Behavior During Therapy  [facility] for tasks assessed/performed       Past Medical History:  Diagnosis Date  . Allergic rhinitis   . Anxiety   . Atelectasis   . CAD (coronary artery disease), native coronary artery    9/18 PCI/DES x1 to mLCx, mild diffuse nonobstructive disease, EF 55% on Lv gram  . Chronic bronchitis ([place])   . COPD (chronic obstructive pulmonary disease) ([place])   . Crohn's disease ([place])   . Depression   . DVT (deep venous thrombosis) (HCC)    RLE X 2  . Dysphagia   . Dyspnea   . Enlarged prostate   . GERD (gastroesophageal reflux disease)   . Glaucoma, both eyes   . Heart murmur   . History of stomach ulcers 1980s  . Hyperlipidemia   . Hypertension    "off RX for years now cause of coughing w/Lisinopril" (03/13/2017)  . IBS (irritable bowel syndrome)   . Paranoid schizophrenia ([place])   . Peripheral vascular disease ([place])   . Pneumonia 1990s  . Pre-diabetes    "one time" (03/13/2017)  . Stroke [facility]) 04/2016   "eye stroke; left eye" (03/13/2017)  . Syncopal episodes     Past Surgical History:  Procedure Laterality Date  . ABDOMINAL HERNIA REPAIR  1990s  . COLECTOMY  1985; ?1989; 1996   "part of my ileum removed"  . CORONARY ANGIOPLASTY WITH STENT PLACEMENT  03/13/2017  . CORONARY STENT INTERVENTION N/A 03/13/2017   Procedure: CORONARY STENT INTERVENTION;  Surgeon: Jettie Booze, MD;  Location: [place] CV LAB;  Service: Cardiovascular;  Laterality: N/A;  . EYE SURGERY Right    "laser; for glaucoma" (03/13/2017)  . FASCIOTOMY Right   . HERNIA REPAIR    . LAPAROSCOPIC CHOLECYSTECTOMY    . LEFT HEART CATH AND CORONARY ANGIOGRAPHY N/A 03/13/2017   Procedure: LEFT HEART CATH AND CORONARY ANGIOGRAPHY;  Surgeon: Jettie Booze, MD;  Location: [place] CV LAB;  Service: Cardiovascular;  Laterality: N/A;  . PENILE PROSTHESIS IMPLANT  01/2011   Archie Endo 02/01/2011  . PERIPHERAL VASCULAR CATHETERIZATION Right 1999   "had blood clots in my leg; went in to open them up; dye went into leg; ended up having a fasiotomy"  . TONSILLECTOMY      There were no vitals filed for this visit.  Subjective Assessment - 08/15/17 1516    Subjective  "Im doing pretty good"    Currently in Pain?  Yes    Pain Score  2     Pain Location  Leg    Pain Orientation  Right                      [facility] Adult PT Treatment/Exercise - 08/15/17 0001      Knee/Hip Exercises: Aerobic   Nustep  L4 x  7 min       Knee/Hip Exercises: Machines for Strengthening   Cybex Knee Extension  10lb 2x10    Cybex Knee Flexion  20lb 2x15    Cybex Leg Press  40# 3x10      Knee/Hip Exercises: Standing   Walking with Sports Cord  all directions 60'               PT Short Term Goals - 07/19/17 1137      PT SHORT TERM GOAL #1   Title  independent with initial HEP    Status  Achieved        PT Long Term Goals - 08/08/17 1553      PT LONG TERM GOAL #1   Title  decrease pain 50%    Status  Partially Met      PT LONG TERM GOAL #2   Title  able to walk without difficulty    Status  Achieved      PT LONG TERM GOAL #3   Title  sit without difficulty    Status  Partially Met      PT LONG TERM GOAL #4   Title  increase SLR to 45 degrees    Status  On-going      PT LONG TERM GOAL #5   Title  increase left hip and knee strength to 4/5    Status  On-going            Plan -  08/15/17 1554    Clinical Impression Statement  Pt reports an overall improvement , stated that's he walks 10 laps at the track. some instability with resisted side steps. Reports he can feels his legs working with seated ext and curls. Cues to perform TKE with ext.    Rehab Potential  Good    PT Frequency  2x / week    PT Duration  8 weeks    PT Treatment/Interventions  ADLs/Self Care Home Management;Cryotherapy;Electrical Stimulation;Moist Heat;Iontophoresis 4mg/ml Dexamethasone;Gait training;Stair training;Functional mobility training;Therapeutic activities;Therapeutic exercise;Balance training;Neuromuscular re-education;Manual techniques;Patient/family education    PT Next Visit Plan  continue to add exercises as he tolerates       Patient will benefit from skilled therapeutic intervention in order to improve the following deficits and impairments:  Abnormal gait, Decreased range of motion, Difficulty walking, Increased muscle spasms, Decreased activity tolerance, Pain, Decreased balance, Impaired flexibility, Postural dysfunction, Decreased strength, Decreased mobility  Visit Diagnosis: Acute pain of left knee  Stiffness of left knee, not elsewhere classified  Pain in left hip  Cramp and spasm  Difficulty in walking, not elsewhere classified     Problem List Patient Active Problem List   Diagnosis Date Noted  . Essential hypertension   . Coronary artery disease involving native coronary artery of native heart with angina pectoris (HCC) 03/13/2017  . Coronary artery calcification seen on CAT scan 01/12/2017  . Chronic obstructive pulmonary disease, unspecified copd, unspecified chronic bronchitis type 09/13/2015  . Cough 08/09/2015  . Flu-like symptoms 08/09/2015  . Acute sinusitis 08/09/2015  . AP (abdominal pain) 07/09/2014  . COLD (chronic obstructive lung disease) (HCC) 07/09/2014  . COPD exacerbation (HCC) 06/01/2014  . Other malaise and fatigue 09/06/2013  .  Encounter for screening for lung cancer 10/09/2012  . Chronic cough 11/19/2011  . Tobacco abuse 04/06/2011  . Mixed hyperlipidemia 01/27/2009  . HYPERTENSION 01/27/2009  . PERIPHERAL VASCULAR DISEASE 01/27/2009  . ALLERGIC RHINITIS 01/27/2009  . Other emphysema (HCC) 01/27/2009  . DIZZINESS, CHRONIC 01/27/2009  .   SHORTNESS OF BREATH (SOB) 01/27/2009  . CROHN'S DISEASE 01/26/2009     G , PTA 08/15/2017, 3:55 PM  [facility] [place] [address] , [place], [address] Phone: [phone]   Fax:  [phone]  Name: Chad Moss MRN: [id number] Date of Birth: 04/18/1956   

## 2017-08-29 ENCOUNTER — Ambulatory Visit: Payer: Medicare Other | Attending: Orthopaedic Surgery | Admitting: Physical Therapy

## 2017-08-29 ENCOUNTER — Encounter: Payer: Self-pay | Admitting: Physical Therapy

## 2017-08-29 DIAGNOSIS — M25562 Pain in left knee: Secondary | ICD-10-CM | POA: Diagnosis not present

## 2017-08-29 DIAGNOSIS — M25552 Pain in left hip: Secondary | ICD-10-CM | POA: Insufficient documentation

## 2017-08-29 DIAGNOSIS — M25662 Stiffness of left knee, not elsewhere classified: Secondary | ICD-10-CM | POA: Diagnosis not present

## 2017-08-29 NOTE — Therapy (Signed)
[facility] [place] [address] [place], [place], [address] Phone: [phone]   Fax:  [phone]  Physical Therapy Treatment  Patient Details  Name: Chad Moss MRN: [id number] Date of Birth: 18-Oct-1955 Referring Provider: Jerl Santos   Encounter Date: 08/29/2017  PT End of Session - 08/29/17 1100    Visit Number  8    Date for PT Re-Evaluation  09/09/17    PT Start Time  1020    PT Stop Time  1100    PT Time Calculation (min)  40 min    Activity Tolerance  Patient tolerated treatment well    Behavior During Therapy  [facility] for tasks assessed/performed       Past Medical History:  Diagnosis Date  . Allergic rhinitis   . Anxiety   . Atelectasis   . CAD (coronary artery disease), native coronary artery    9/18 PCI/DES x1 to mLCx, mild diffuse nonobstructive disease, EF 55% on Lv gram  . Chronic bronchitis (HCC)   . COPD (chronic obstructive pulmonary disease) (HCC)   . Crohn's disease (HCC)   . Depression   . DVT (deep venous thrombosis) (HCC)    RLE X 2  . Dysphagia   . Dyspnea   . Enlarged prostate   . GERD (gastroesophageal reflux disease)   . Glaucoma, both eyes   . Heart murmur   . History of stomach ulcers 1980s  . Hyperlipidemia   . Hypertension    "off RX for years now cause of coughing w/Lisinopril" (03/13/2017)  . IBS (irritable bowel syndrome)   . Paranoid schizophrenia (HCC)   . Peripheral vascular disease (HCC)   . Pneumonia 1990s  . Pre-diabetes    "one time" (03/13/2017)  . Stroke [facility]) 04/2016   "eye stroke; left eye" (03/13/2017)  . Syncopal episodes     Past Surgical History:  Procedure Laterality Date  . ABDOMINAL HERNIA REPAIR  1990s  . COLECTOMY  1985; ?1989; 1996   "part of my ileum removed"  . CORONARY ANGIOPLASTY WITH STENT PLACEMENT  03/13/2017  . CORONARY STENT INTERVENTION N/A 03/13/2017   Procedure: CORONARY STENT INTERVENTION;  Surgeon: Corky Crafts, MD;  Location: [facility]  INVASIVE CV LAB;  Service: Cardiovascular;  Laterality: N/A;  . EYE SURGERY Right    "laser; for glaucoma" (03/13/2017)  . FASCIOTOMY Right   . HERNIA REPAIR    . LAPAROSCOPIC CHOLECYSTECTOMY    . LEFT HEART CATH AND CORONARY ANGIOGRAPHY N/A 03/13/2017   Procedure: LEFT HEART CATH AND CORONARY ANGIOGRAPHY;  Surgeon: Corky Crafts, MD;  Location: [facility] INVASIVE CV LAB;  Service: Cardiovascular;  Laterality: N/A;  . PENILE PROSTHESIS IMPLANT  01/2011   Hattie Perch 02/01/2011  . PERIPHERAL VASCULAR CATHETERIZATION Right 1999   "had blood clots in my leg; went in to open them up; dye went into leg; ended up having a fasiotomy"  . TONSILLECTOMY      There were no vitals filed for this visit.  Subjective Assessment - 08/29/17 1030    Subjective  "OK"    Currently in Pain?  No/denies    Pain Score  0-No pain         [facility] PT Assessment - 08/29/17 0001      Strength   Overall Strength Comments  4/5 for left knee and hip                   [facility] Adult PT Treatment/Exercise - 08/29/17 0001  Ambulation/Gait   Gait Comments  2 flights of stairs      Knee/Hip Exercises: Aerobic   Nustep  L5 x 6 min       Knee/Hip Exercises: Machines for Strengthening   Cybex Knee Extension  10lb 2x10    Cybex Knee Flexion  20lb 2x15    Cybex Leg Press  40# 3x10      Knee/Hip Exercises: Seated   Sit to Sand  2 sets;without UE support;10 reps               PT Short Term Goals - 07/19/17 1137      PT SHORT TERM GOAL #1   Title  independent with initial HEP    Status  Achieved        PT Long Term Goals - 08/29/17 1054      PT LONG TERM GOAL #5   Title  increase left hip and knee strength to 4/5    Status  Achieved            Plan - 08/29/17 1101    Clinical Impression Statement  pt continues to do well and is progressing towards goals. Pt stated that his leg has stopped hurting. Pt reports that he  "Can feel it" with sit to stands and stair negotiation.  Fatigues quick with functional activity.    Rehab Potential  Good    PT Frequency  2x / week    PT Treatment/Interventions  ADLs/Self Care Home Management;Cryotherapy;Electrical Stimulation;Moist Heat;Iontophoresis 4mg /ml Dexamethasone;Gait training;Stair training;Functional mobility training;Therapeutic activities;Therapeutic exercise;Balance training;Neuromuscular re-education;Manual techniques;Patient/family education    PT Next Visit Plan  continue to add exercises as he tolerates       Patient will benefit from skilled therapeutic intervention in order to improve the following deficits and impairments:     Visit Diagnosis: Acute pain of left knee  Stiffness of left knee, not elsewhere classified  Pain in left hip     Problem List Patient Active Problem List   Diagnosis Date Noted  . Essential hypertension   . Coronary artery disease involving native coronary artery of native heart with angina pectoris (HCC) 03/13/2017  . Coronary artery calcification seen on CAT scan 01/12/2017  . Chronic obstructive pulmonary disease, unspecified copd, unspecified chronic bronchitis type 09/13/2015  . Cough 08/09/2015  . Flu-like symptoms 08/09/2015  . Acute sinusitis 08/09/2015  . AP (abdominal pain) 07/09/2014  . COLD (chronic obstructive lung disease) (HCC) 07/09/2014  . COPD exacerbation (HCC) 06/01/2014  . Other malaise and fatigue 09/06/2013  . Encounter for screening for lung cancer 10/09/2012  . Chronic cough 11/19/2011  . Tobacco abuse 04/06/2011  . Mixed hyperlipidemia 01/27/2009  . HYPERTENSION 01/27/2009  . PERIPHERAL VASCULAR DISEASE 01/27/2009  . ALLERGIC RHINITIS 01/27/2009  . Other emphysema (HCC) 01/27/2009  . DIZZINESS, CHRONIC 01/27/2009  . SHORTNESS OF BREATH (SOB) 01/27/2009  . CROHN'S DISEASE 01/26/2009    Grayce Sessions, PTA 08/29/2017, 11:03 AM  [facility] [place] [address] [place],  [place], [address] Phone: [phone]   Fax:  [phone]  Name: Chad Moss MRN: [id number] Date of Birth: 1956/04/30

## 2017-09-05 ENCOUNTER — Encounter: Payer: Self-pay | Admitting: Physical Therapy

## 2017-09-05 ENCOUNTER — Ambulatory Visit: Payer: Medicare Other | Admitting: Physical Therapy

## 2017-09-05 DIAGNOSIS — M25662 Stiffness of left knee, not elsewhere classified: Secondary | ICD-10-CM | POA: Diagnosis not present

## 2017-09-05 DIAGNOSIS — M25562 Pain in left knee: Secondary | ICD-10-CM | POA: Diagnosis not present

## 2017-09-05 DIAGNOSIS — M25552 Pain in left hip: Secondary | ICD-10-CM | POA: Diagnosis not present

## 2017-09-05 NOTE — Therapy (Signed)
[facility] [place] [address] [place], [place], [address] Phone: [phone]   Fax:  [phone]  Physical Therapy Treatment  Patient Details  Name: Chad Moss MRN: [id number] Date of Birth: 1955/07/04 Referring Provider: Jerl Santos   Encounter Date: 09/05/2017  PT End of Session - 09/05/17 1057    Visit Number  9    Date for PT Re-Evaluation  09/09/17    PT Start Time  1030    PT Stop Time  1100    PT Time Calculation (min)  30 min    Activity Tolerance  Patient tolerated treatment well    Behavior During Therapy  [facility] for tasks assessed/performed       Past Medical History:  Diagnosis Date  . Allergic rhinitis   . Anxiety   . Atelectasis   . CAD (coronary artery disease), native coronary artery    9/18 PCI/DES x1 to mLCx, mild diffuse nonobstructive disease, EF 55% on Lv gram  . Chronic bronchitis (HCC)   . COPD (chronic obstructive pulmonary disease) (HCC)   . Crohn's disease (HCC)   . Depression   . DVT (deep venous thrombosis) (HCC)    RLE X 2  . Dysphagia   . Dyspnea   . Enlarged prostate   . GERD (gastroesophageal reflux disease)   . Glaucoma, both eyes   . Heart murmur   . History of stomach ulcers 1980s  . Hyperlipidemia   . Hypertension    "off RX for years now cause of coughing w/Lisinopril" (03/13/2017)  . IBS (irritable bowel syndrome)   . Paranoid schizophrenia (HCC)   . Peripheral vascular disease (HCC)   . Pneumonia 1990s  . Pre-diabetes    "one time" (03/13/2017)  . Stroke [facility]) 04/2016   "eye stroke; left eye" (03/13/2017)  . Syncopal episodes     Past Surgical History:  Procedure Laterality Date  . ABDOMINAL HERNIA REPAIR  1990s  . COLECTOMY  1985; ?1989; 1996   "part of my ileum removed"  . CORONARY ANGIOPLASTY WITH STENT PLACEMENT  03/13/2017  . CORONARY STENT INTERVENTION N/A 03/13/2017   Procedure: CORONARY STENT INTERVENTION;  Surgeon: Corky Crafts, MD;  Location: [facility]  INVASIVE CV LAB;  Service: Cardiovascular;  Laterality: N/A;  . EYE SURGERY Right    "laser; for glaucoma" (03/13/2017)  . FASCIOTOMY Right   . HERNIA REPAIR    . LAPAROSCOPIC CHOLECYSTECTOMY    . LEFT HEART CATH AND CORONARY ANGIOGRAPHY N/A 03/13/2017   Procedure: LEFT HEART CATH AND CORONARY ANGIOGRAPHY;  Surgeon: Corky Crafts, MD;  Location: [facility] INVASIVE CV LAB;  Service: Cardiovascular;  Laterality: N/A;  . PENILE PROSTHESIS IMPLANT  01/2011   Hattie Perch 02/01/2011  . PERIPHERAL VASCULAR CATHETERIZATION Right 1999   "had blood clots in my leg; went in to open them up; dye went into leg; ended up having a fasiotomy"  . TONSILLECTOMY      There were no vitals filed for this visit.  Subjective Assessment - 09/05/17 1032    Subjective  "OK"    Currently in Pain?  No/denies    Pain Score  0-No pain                      [facility] Adult PT Treatment/Exercise - 09/05/17 0001      Ambulation/Gait   Gait Comments  resisted gait all directions      Knee/Hip Exercises: Aerobic   Recumbent Bike  x64min  Nustep  L6 x 6 min       Knee/Hip Exercises: Machines for Strengthening   Cybex Knee Extension  10lb 2x10    Cybex Knee Flexion  25lb 2x10    Cybex Leg Press  40# 2x15               PT Short Term Goals - 07/19/17 1137      PT SHORT TERM GOAL #1   Title  independent with initial HEP    Status  Achieved        PT Long Term Goals - 08/29/17 1054      PT LONG TERM GOAL #5   Title  increase left hip and knee strength to 4/5    Status  Achieved            Plan - 09/05/17 1058    Clinical Impression Statement  Pt ~ 15 minutes late for today's session. Some instability with resisted side steps. Good strength and ROM with machine exercises. No reports of increase pain.     PT Frequency  2x / week    PT Duration  8 weeks    PT Treatment/Interventions  ADLs/Self Care Home Management;Cryotherapy;Electrical Stimulation;Moist Heat;Iontophoresis 4mg /ml  Dexamethasone;Gait training;Stair training;Functional mobility training;Therapeutic activities;Therapeutic exercise;Balance training;Neuromuscular re-education;Manual techniques;Patient/family education    PT Next Visit Plan  continue to add exercises as he tolerates       Patient will benefit from skilled therapeutic intervention in order to improve the following deficits and impairments:  Abnormal gait, Decreased range of motion, Difficulty walking, Increased muscle spasms, Decreased activity tolerance, Pain, Decreased balance, Impaired flexibility, Postural dysfunction, Decreased strength, Decreased mobility  Visit Diagnosis: Stiffness of left knee, not elsewhere classified  Pain in left hip  Acute pain of left knee     Problem List Patient Active Problem List   Diagnosis Date Noted  . Essential hypertension   . Coronary artery disease involving native coronary artery of native heart with angina pectoris (HCC) 03/13/2017  . Coronary artery calcification seen on CAT scan 01/12/2017  . Chronic obstructive pulmonary disease, unspecified copd, unspecified chronic bronchitis type 09/13/2015  . Cough 08/09/2015  . Flu-like symptoms 08/09/2015  . Acute sinusitis 08/09/2015  . AP (abdominal pain) 07/09/2014  . COLD (chronic obstructive lung disease) (HCC) 07/09/2014  . COPD exacerbation (HCC) 06/01/2014  . Other malaise and fatigue 09/06/2013  . Encounter for screening for lung cancer 10/09/2012  . Chronic cough 11/19/2011  . Tobacco abuse 04/06/2011  . Mixed hyperlipidemia 01/27/2009  . HYPERTENSION 01/27/2009  . PERIPHERAL VASCULAR DISEASE 01/27/2009  . ALLERGIC RHINITIS 01/27/2009  . Other emphysema (HCC) 01/27/2009  . DIZZINESS, CHRONIC 01/27/2009  . SHORTNESS OF BREATH (SOB) 01/27/2009  . CROHN'S DISEASE 01/26/2009    Grayce Sessions, PTA 09/05/2017, 11:00 AM  [facility] [place] [address] [place], [place],  [address] Phone: [phone]   Fax:  [phone]  Name: Chad Moss MRN: [id number] Date of Birth: [birth year]

## 2017-09-06 ENCOUNTER — Other Ambulatory Visit: Payer: Self-pay | Admitting: Cardiology

## 2017-09-12 ENCOUNTER — Ambulatory Visit: Payer: Medicare Other | Admitting: Physical Therapy

## 2017-09-19 ENCOUNTER — Other Ambulatory Visit: Payer: Self-pay | Admitting: Interventional Cardiology

## 2017-09-26 ENCOUNTER — Ambulatory Visit: Payer: Medicare Other | Admitting: Physical Therapy

## 2017-09-27 DIAGNOSIS — E785 Hyperlipidemia, unspecified: Secondary | ICD-10-CM | POA: Diagnosis not present

## 2017-09-27 DIAGNOSIS — N181 Chronic kidney disease, stage 1: Secondary | ICD-10-CM | POA: Diagnosis not present

## 2017-09-27 DIAGNOSIS — J449 Chronic obstructive pulmonary disease, unspecified: Secondary | ICD-10-CM | POA: Diagnosis not present

## 2017-09-27 DIAGNOSIS — I129 Hypertensive chronic kidney disease with stage 1 through stage 4 chronic kidney disease, or unspecified chronic kidney disease: Secondary | ICD-10-CM | POA: Diagnosis not present

## 2017-10-02 ENCOUNTER — Telehealth: Payer: Self-pay | Admitting: Internal Medicine

## 2017-10-02 ENCOUNTER — Encounter: Payer: Self-pay | Admitting: Physical Therapy

## 2017-10-02 ENCOUNTER — Ambulatory Visit: Payer: Medicare Other | Attending: Orthopaedic Surgery | Admitting: Physical Therapy

## 2017-10-02 DIAGNOSIS — R262 Difficulty in walking, not elsewhere classified: Secondary | ICD-10-CM | POA: Insufficient documentation

## 2017-10-02 DIAGNOSIS — M25552 Pain in left hip: Secondary | ICD-10-CM | POA: Diagnosis not present

## 2017-10-02 DIAGNOSIS — M25562 Pain in left knee: Secondary | ICD-10-CM | POA: Insufficient documentation

## 2017-10-02 DIAGNOSIS — Z720 Tobacco use: Secondary | ICD-10-CM

## 2017-10-02 DIAGNOSIS — M25662 Stiffness of left knee, not elsewhere classified: Secondary | ICD-10-CM | POA: Diagnosis not present

## 2017-10-02 DIAGNOSIS — Z87891 Personal history of nicotine dependence: Secondary | ICD-10-CM

## 2017-10-02 DIAGNOSIS — R252 Cramp and spasm: Secondary | ICD-10-CM | POA: Diagnosis not present

## 2017-10-02 NOTE — Telephone Encounter (Signed)
I just spoke with Erline Levine with CT and she finally has what she needs to do this patient CT

## 2017-10-02 NOTE — Therapy (Signed)
[place], [place], [address] Phone: [phone]   Fax:  [phone]  Physical Therapy Treatment  Patient Details  Name: Chad Moss MRN: [id number] Date of Birth: 01-21-1956 Referring Provider: Rhona Raider   Encounter Date: 10/02/2017  PT End of Session - 10/02/17 1216    Visit Number  10    Date for PT Re-Evaluation  09/09/17    PT Start Time  1145    PT Stop Time  1216    PT Time Calculation (min)  31 min    Activity Tolerance  Patient tolerated treatment well    Behavior During Therapy  [facility] for tasks assessed/performed       Past Medical History:  Diagnosis Date  . Allergic rhinitis   . Anxiety   . Atelectasis   . CAD (coronary artery disease), native coronary artery    9/18 PCI/DES x1 to mLCx, mild diffuse nonobstructive disease, EF 55% on Lv gram  . Chronic bronchitis ([place])   . COPD (chronic obstructive pulmonary disease) ([place])   . Crohn's disease ([place])   . Depression   . DVT (deep venous thrombosis) (HCC)    RLE X 2  . Dysphagia   . Dyspnea   . Enlarged prostate   . GERD (gastroesophageal reflux disease)   . Glaucoma, both eyes   . Heart murmur   . History of stomach ulcers 1980s  . Hyperlipidemia   . Hypertension    "off RX for years now cause of coughing w/Lisinopril" (03/13/2017)  . IBS (irritable bowel syndrome)   . Paranoid schizophrenia ([place])   . Peripheral vascular disease ([place])   . Pneumonia 1990s  . Pre-diabetes    "one time" (03/13/2017)  . Stroke [facility]) 04/2016   "eye stroke; left eye" (03/13/2017)  . Syncopal episodes     Past Surgical History:  Procedure Laterality Date  . ABDOMINAL HERNIA REPAIR  1990s  . COLECTOMY  1985; ?1989; 1996   "part of my ileum removed"  . CORONARY ANGIOPLASTY WITH STENT PLACEMENT  03/13/2017  . CORONARY STENT INTERVENTION N/A 03/13/2017   Procedure: CORONARY STENT INTERVENTION;  Surgeon: Jettie Booze, MD;  Location:  [place] CV LAB;  Service: Cardiovascular;  Laterality: N/A;  . EYE SURGERY Right    "laser; for glaucoma" (03/13/2017)  . FASCIOTOMY Right   . HERNIA REPAIR    . LAPAROSCOPIC CHOLECYSTECTOMY    . LEFT HEART CATH AND CORONARY ANGIOGRAPHY N/A 03/13/2017   Procedure: LEFT HEART CATH AND CORONARY ANGIOGRAPHY;  Surgeon: Jettie Booze, MD;  Location: [place] CV LAB;  Service: Cardiovascular;  Laterality: N/A;  . PENILE PROSTHESIS IMPLANT  01/2011   Archie Endo 02/01/2011  . PERIPHERAL VASCULAR CATHETERIZATION Right 1999   "had blood clots in my leg; went in to open them up; dye went into leg; ended up having a fasiotomy"  . TONSILLECTOMY      There were no vitals filed for this visit.  Subjective Assessment - 10/02/17 1148    Subjective  "ok"    Currently in Pain?  No/denies    Pain Score  0-No pain                       [facility] Adult PT Treatment/Exercise - 10/02/17 0001      Ambulation/Gait   Gait Comments  resisted gait all directions, 2 flights of stairs alt pattern Independent no rail      Knee/Hip  Exercises: Aerobic   Recumbent Bike  x75mn    Nustep  L6 x 6 min       Knee/Hip Exercises: Machines for Strengthening   Cybex Leg Press  40# 2x15      Knee/Hip Exercises: Standing   Forward Step Up  Both;1 set;10 reps;Hand Hold: 0;Step Height: 6"               PT Short Term Goals - 07/19/17 1137      PT SHORT TERM GOAL #1   Title  independent with initial HEP    Status  Achieved        PT Long Term Goals - 10/02/17 1216      PT LONG TERM GOAL #1   Title  decrease pain 50%    Status  Achieved      PT LONG TERM GOAL #2   Title  able to walk without difficulty    Status  Achieved      PT LONG TERM GOAL #3   Title  sit without difficulty    Status  Achieved      PT LONG TERM GOAL #4   Title  increase SLR to 45 degrees    Status  Achieved            Plan - 10/02/17 1217    Clinical Impression Statement  All goals met. Reports  only fatigue from today exercises    Rehab Potential  Good    PT Frequency  2x / week    PT Duration  8 weeks    PT Treatment/Interventions  ADLs/Self Care Home Management;Cryotherapy;Electrical Stimulation;Moist Heat;Iontophoresis 46mml Dexamethasone;Gait training;Stair training;Functional mobility training;Therapeutic activities;Therapeutic exercise;Balance training;Neuromuscular re-education;Manual techniques;Patient/family education    PT Next Visit Plan  D/C PT       Patient will benefit from skilled therapeutic intervention in order to improve the following deficits and impairments:  Abnormal gait, Decreased range of motion, Difficulty walking, Increased muscle spasms, Decreased activity tolerance, Pain, Decreased balance, Impaired flexibility, Postural dysfunction, Decreased strength, Decreased mobility  Visit Diagnosis: Stiffness of left knee, not elsewhere classified  Pain in left hip  Acute pain of left knee  Cramp and spasm  Difficulty in walking, not elsewhere classified     Problem List Patient Active Problem List   Diagnosis Date Noted  . Essential hypertension   . Coronary artery disease involving native coronary artery of native heart with angina pectoris (HCQuarryville09/25/2018  . Coronary artery calcification seen on CAT scan 01/12/2017  . Chronic obstructive pulmonary disease, unspecified copd, unspecified chronic bronchitis type 09/13/2015  . Cough 08/09/2015  . Flu-like symptoms 08/09/2015  . Acute sinusitis 08/09/2015  . AP (abdominal pain) 07/09/2014  . COLD (chronic obstructive lung disease) (HCCrossett01/21/2016  . COPD exacerbation (HCLa Fayette12/14/2015  . Other malaise and fatigue 09/06/2013  . Encounter for screening for lung cancer 10/09/2012  . Chronic cough 11/19/2011  . Tobacco abuse 04/06/2011  . Mixed hyperlipidemia 01/27/2009  . HYPERTENSION 01/27/2009  . PERIPHERAL VASCULAR DISEASE 01/27/2009  . ALLERGIC RHINITIS 01/27/2009  . Other emphysema (HCVienna 01/27/2009  . DIZZINESS, CHRONIC 01/27/2009  . SHORTNESS OF BREATH (SOB) 01/27/2009  . CROHN'S DISEASE 01/26/2009   PHYSICAL THERAPY DISCHARGE SUMMARY  Visits from Start of Care: 10 Plan: Patient agrees to discharge.  Patient goals were met. Patient is being discharged due to meeting the stated rehab goals.  ?????      Ro[place]ot JunPTA 10/02/2017, 12:18 PM  [facility]  [place], [place], [address] Phone: [phone]   Fax:  [phone]  Name: Chad Moss MRN: [id number] Date of Birth: 11-18-1955

## 2017-10-02 NOTE — Telephone Encounter (Signed)
Called and spoke to [place] with [facility]. Stacy requested that CT low dose dx be changed, as insurance will not cover current dx (Emphysema and Tobacco use)  Order has been placed with dx code [phone], as provided by Marzetta Board. Will route to [place] to make aware.  Nothing further is needed.

## 2017-10-03 ENCOUNTER — Ambulatory Visit (INDEPENDENT_AMBULATORY_CARE_PROVIDER_SITE_OTHER)
Admission: RE | Admit: 2017-10-03 | Discharge: 2017-10-03 | Disposition: A | Discharge status: Home / Self Care | Payer: Medicare Other | Source: Ambulatory Visit | Attending: Internal Medicine | Admitting: Internal Medicine

## 2017-10-03 ENCOUNTER — Inpatient Hospital Stay: Admission: RE | Admit: 2017-10-03 | Payer: Medicare Other | Source: Ambulatory Visit

## 2017-10-03 DIAGNOSIS — Z87891 Personal history of nicotine dependence: Secondary | ICD-10-CM

## 2017-10-03 DIAGNOSIS — Z720 Tobacco use: Secondary | ICD-10-CM

## 2017-10-03 IMAGING — CT CT CHEST LUNG CANCER SCREENING LOW DOSE W/O CM
2 of 4 series · 15 of 40 positions shown, 18 images · non-contrast
Comparison: [DATE]

CLINICAL DATA: 61-year-old male with 47 pack-year history of
smoking. Lung cancer screening.

EXAM:
CT CHEST WITHOUT CONTRAST LOW-DOSE FOR LUNG CANCER SCREENING
TECHNIQUE: Multidetector CT imaging of the chest was performed following the
standard protocol without IV contrast.

[Series 2: thorax 5.0 i31f 3 · axial · 0.77mm/px · z∈[-256,-26]mm · 12 of 52 slices shown, 15 images]
[im 3/52  mediastinal]
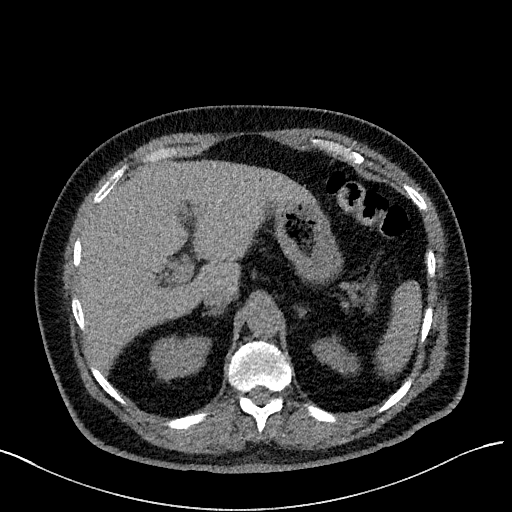
[im 3/52  lung]
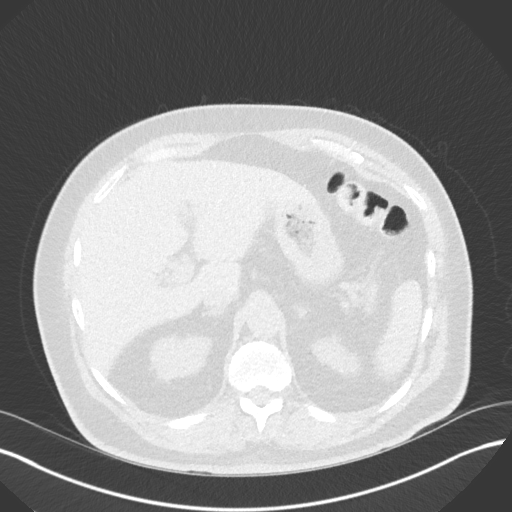
[im 7/52  lung]
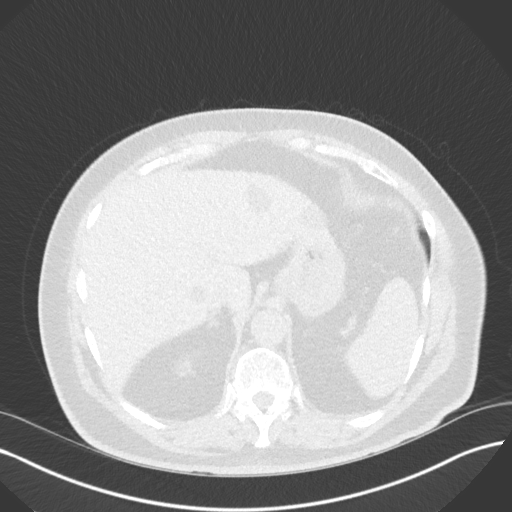
[im 12/52  lung]
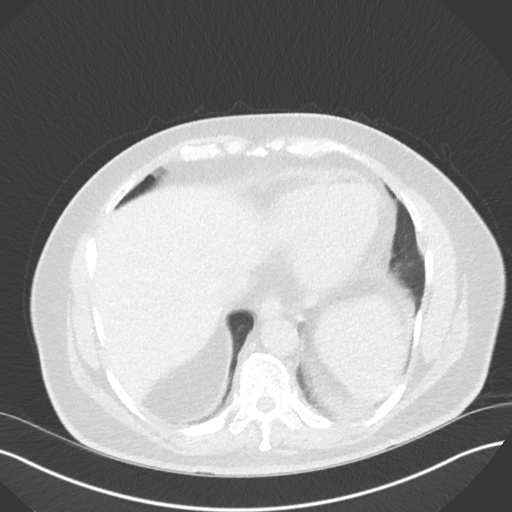
[im 16/52  lung]
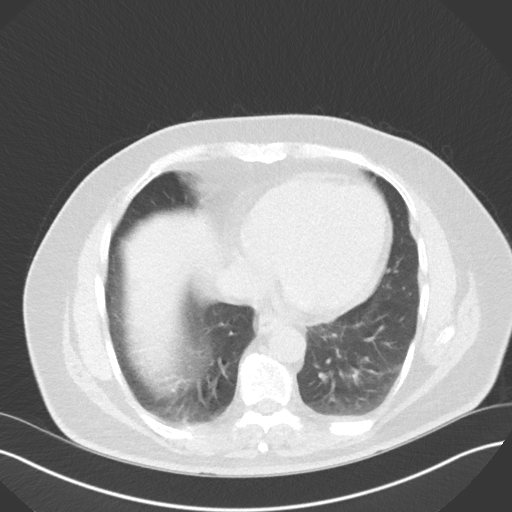
[im 20/52  mediastinal]
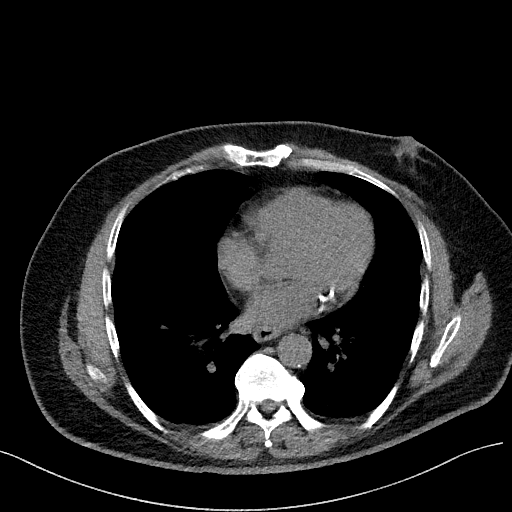
[im 20/52  lung]
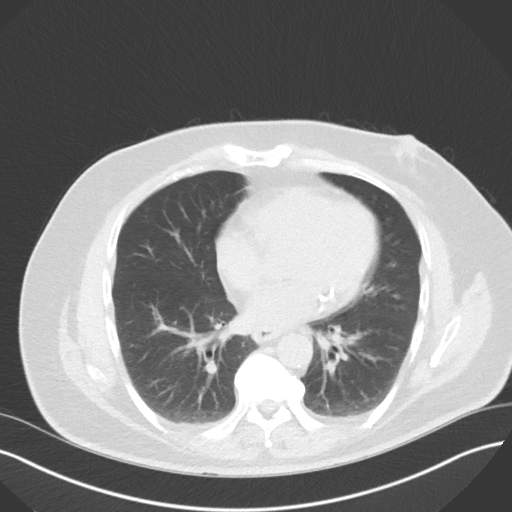
[im 25/52  lung]
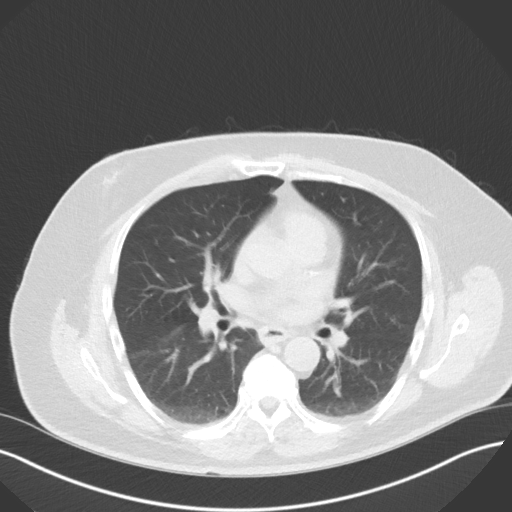
[im 27/52  lung]
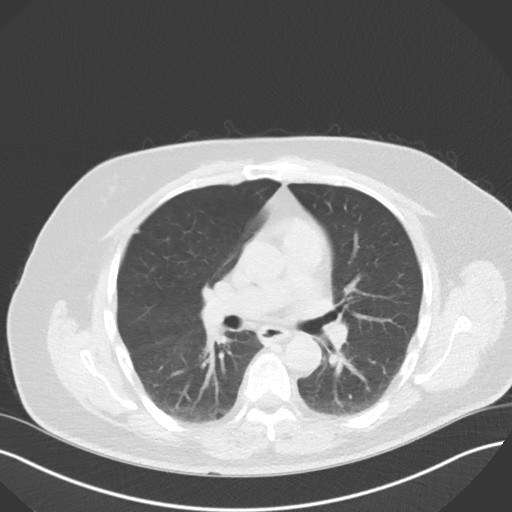
[im 32/52  lung]
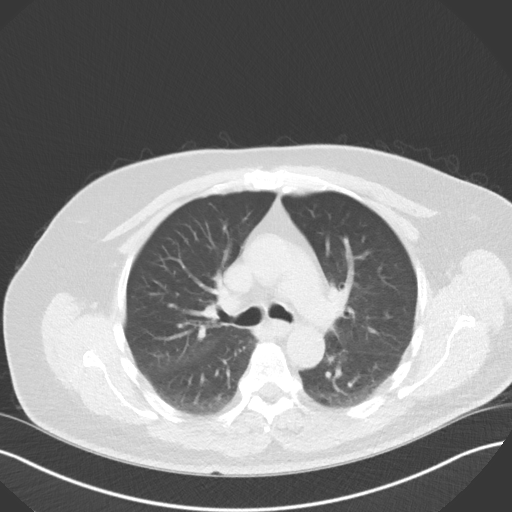
[im 36/52  mediastinal]
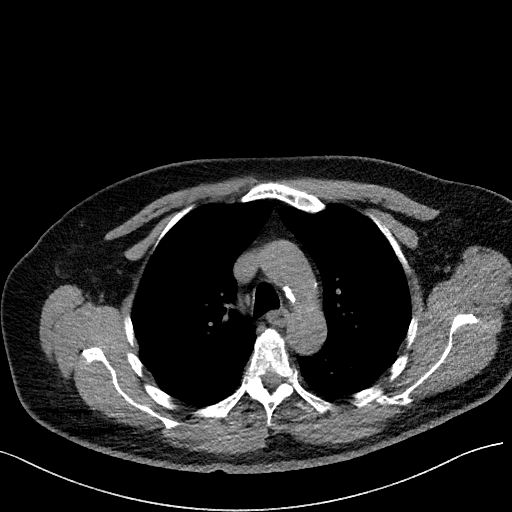
[im 36/52  lung]
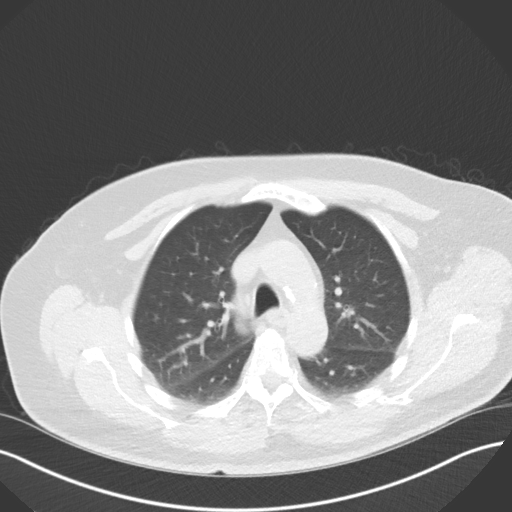
[im 40/52  lung]
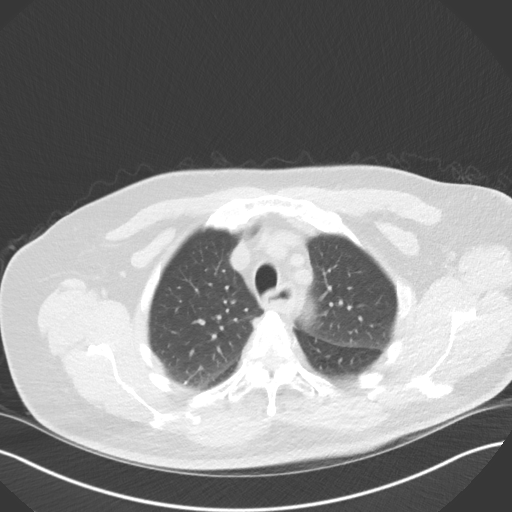
[im 45/52  lung]
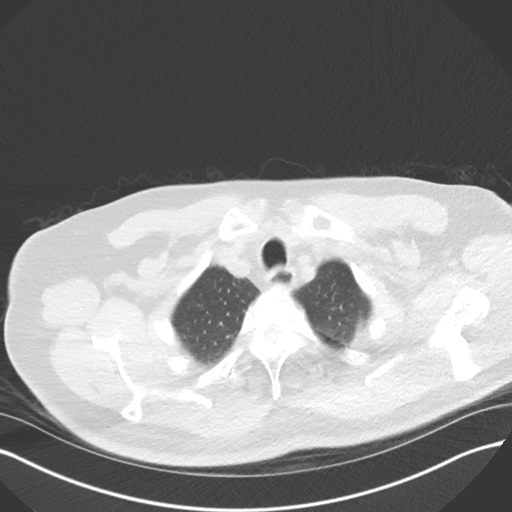
[im 49/52  lung]
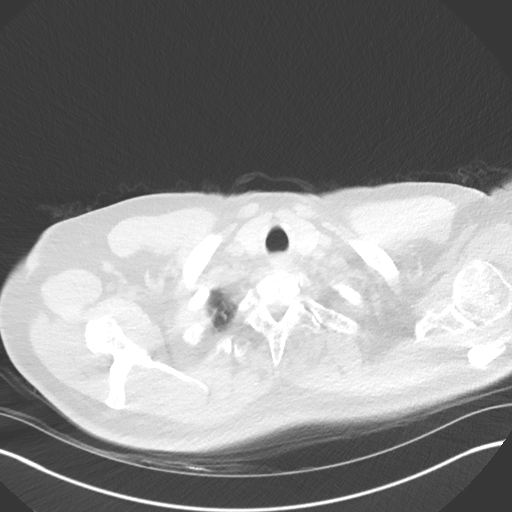

[Series 5: coronal · coronal · 0.51mm/px · 3 of 118 slices shown]
[im 24/118  lung]
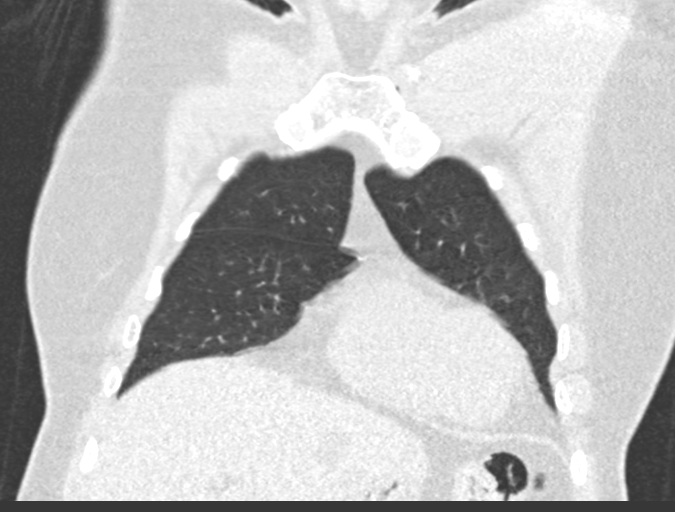
[im 47/118  lung]
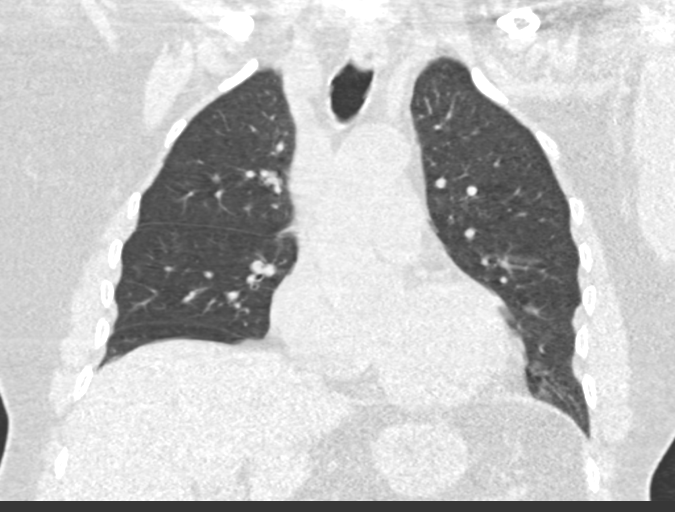
[im 71/118  lung]
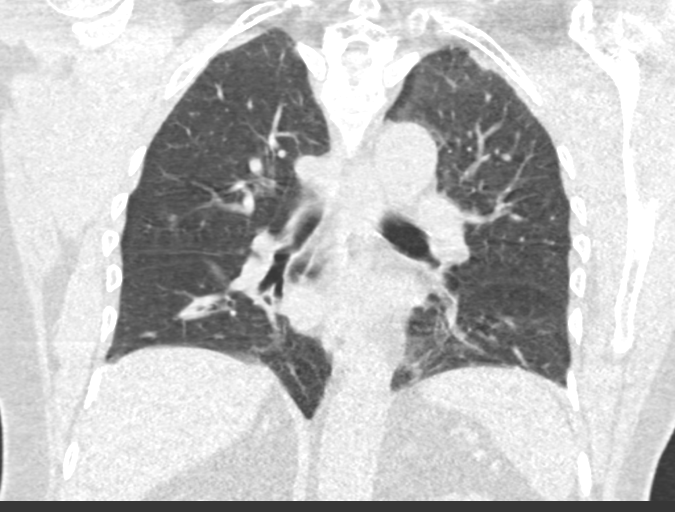

[15 of 40 positions shown; findings below may reference images not displayed]

FINDINGS: Cardiovascular: The heart size is normal. No pericardial effusion.
Coronary artery calcification is evident. Atherosclerotic
calcification is noted in the wall of the thoracic aorta.

Mediastinum/Nodes: No mediastinal lymphadenopathy. There is no hilar
lymphadenopathy. The esophagus has normal imaging features. There is
no axillary lymphadenopathy.

Lungs/Pleura: Tiny bilateral calcified granulomata are identified.
Noncalcified tiny bilateral pulmonary nodules measure less than 4
mm. No evidence for focal airspace consolidation. No pulmonary edema
or pleural effusion.

Upper Abdomen: Stable 2.3 cm low-density lesion lateral segment left
liver. Small hypodensity in the medial right liver is similar. 15 mm
subcapsular low-density lesion in the dome of the liver is
unchanged. Interval stability of these lesions is most compatible
with benign etiology such as cysts.

Musculoskeletal: Bone windows reveal no worrisome lytic or sclerotic
osseous lesions.
IMPRESSION: 1. The Lung-RADS 2, benign appearance or behavior. Continue annual
screening with low-dose chest CT without contrast in 12 months.
2.  Aortic Atherosclerois ([ZV]-170.0)

## 2017-10-15 ENCOUNTER — Other Ambulatory Visit: Payer: Self-pay | Admitting: Interventional Cardiology

## 2017-10-16 ENCOUNTER — Encounter: Payer: Self-pay | Admitting: Internal Medicine

## 2017-10-16 ENCOUNTER — Ambulatory Visit (INDEPENDENT_AMBULATORY_CARE_PROVIDER_SITE_OTHER): Payer: Medicare Other | Admitting: Internal Medicine

## 2017-10-16 VITALS — BP 124/74 | HR 103 | Ht 71.0 in | Wt 206.6 lb

## 2017-10-16 DIAGNOSIS — Z122 Encounter for screening for malignant neoplasm of respiratory organs: Secondary | ICD-10-CM

## 2017-10-16 DIAGNOSIS — J438 Other emphysema: Secondary | ICD-10-CM | POA: Diagnosis not present

## 2017-10-16 DIAGNOSIS — Z720 Tobacco use: Secondary | ICD-10-CM

## 2017-10-16 NOTE — Patient Instructions (Signed)
Tobacco use  - please quit esp with ongoing heart disease  Encounter for screening for lung cancer - no cancer CT chest April 2019 - nex CT chest low dose May 2020  Other emphysema ([place]) - stable but you need to take spiriva daily; please do that  Followup 9 months or sooner; CAT score at followup

## 2017-10-16 NOTE — Progress Notes (Signed)
Subjective:     Patient ID: Chad Moss, male   DOB: Aug 14, 1955, 62 y.o.   MRN: [id number]  HPI    62 year old smoker with  emphysema  Chief Complaint  Patient presents with  . Acute Visit    Sob, when he lays down at night, cannot breath, dry, non-productive cough, throat itches, no chest tightness, pain in right side of the chest this morning.      He continues to smoke Accompanied by his girlfriend/caregiver-reports sudden onset worsening of his cough for the last 2-3 days, dry, complains of right-sided chest pain when he coughs which is episodic, and resolved spontaneously. He has not tried any over-the-counter medications Denies preceding URI symptoms or fever No pedal edema, orthopnea or paroxysmal nocturnal dyspnea, no chest pain on exertion  He is compliant with albuterol and this seems to relieve her symptoms  Significant tests/ events Spirometry 07/2015 Normal FEV1 but diffusion capacity is low at 56% and therefore he is isolated low diffusion capacity account of his emphysema   Screening CT chest/2017-  stable 4 mm right subpleural nodule   OV 01/12/2017  Chief Complaint  Patient presents with  . Follow-up    Pt c/o increase in dry cough. Pt denies chest congestion, f/c/s, CP/tightness. Pt states he feels his breathing is doing well.      Follow-up for he is here with his caretaker  Smoking: He continues to smoke. Because of his paranoid schizophrenia is unable to quit.  New finding: CT scan of the chest done for lung scan (screening shows coronary artery calcification. According to the caretaker he had a stress test many years ago. He does not have chest pain within the caretaker said that some years ago he did have chest pain. They're interested in seeing Dr Irish Lack o   Emphysema: He is on Spiriva but he is also taking his albuterol daily. He says that this is because of cough which is helped with albuterol. He is open to up- scaling his scheduled  inhaler  Lung cancer screening: He had a benign CT scan April 2018 as documented below. Next CT scan is -2019 IMPRESSION: 1. Lung-RADS Category 2, benign appearance or behavior. Continue annual screening with low-dose chest CT without contrast in 12 months. 2. Aortic atherosclerosis (ICD10-170.0). Coronary artery calcification.   Electronically Signed   By: Lorin Picket M.D.   On: 10/02/2016 12:54   OV 10/16/2017  Chief Complaint  Patient presents with  . Follow-up    lung ca screen ct 4/17. Pt states he has been doing good. States he becomes SOB at night when laying down to sleep but when pt gets up, it goes away.   FU smoking: he continues to smoke. He does not fully admit to the extent that he smoked according to think is working quite a lot.  Follow-up COPD: COPD cat score is 9. The cough is slightly worse than baseline. The caretaker tells me that he is not compliant with the Spiriva anymore. Other no other symptoms of exacerbation.  Follow up lung cancer screening: He had low-dose CT scan of the chest April 2019 Review of the result. This no lung nodule or cancer. One-year follow-up is recommended.  past medical history: Last visit referred him to cardiology for coronary artery calcification. And in September 2018 status post stent. He says after this chest pain is resolved.   CAT COPD Symptom & Quality of Life Score (GSK trademark) 0 is no burden. 5 is highest burden 10/16/2017  Never Cough -> Cough all the time 3  No phlegm in chest -> Chest is full of phlegm 0  No chest tightness -> Chest feels very tight 0  No dyspnea for 1 flight stairs/hill -> Very dyspneic for 1 flight of stairs 0  No limitations for ADL at home -> Very limited with ADL at home 0  Confident leaving home -> Not at all confident leaving home 0  Sleep soundly -> Do not sleep soundly because of lung condition 3  Lots of Energy -> No energy at all 3  TOTAL Score (max 40)  9      LDC CT  chest April 2019 IMPRESSION: 1. The Lung-RADS 2, benign appearance or behavior. Continue annual screening with low-dose chest CT without contrast in 12 months. 2.  Aortic Atherosclerois (ICD10-170.0)   Electronically Signed   By: Misty Stanley M.D.   On: 10/03/2017 16:59   has a past medical history of Allergic rhinitis, Anxiety, Atelectasis, CAD (coronary artery disease), native coronary artery, Chronic bronchitis ([place]), COPD (chronic obstructive pulmonary disease) ([place]), Crohn's disease ([place]), Depression, DVT (deep venous thrombosis) ([place]), Dysphagia, Dyspnea, Enlarged prostate, GERD (gastroesophageal reflux disease), Glaucoma, both eyes, Heart murmur, History of stomach ulcers (1980s), Hyperlipidemia, Hypertension, IBS (irritable bowel syndrome), Paranoid schizophrenia ([place]), Peripheral vascular disease ([place]), Pneumonia (1990s), Pre-diabetes, Stroke ([place]) (04/2016), and Syncopal episodes.   reports that he has been smoking cigarettes.  He has a 47.00 pack-year smoking history. He has never used smokeless tobacco.  Past Surgical History:  Procedure Laterality Date  . ABDOMINAL HERNIA REPAIR  1990s  . COLECTOMY  1985; ?1989; 1996   "part of my ileum removed"  . CORONARY ANGIOPLASTY WITH STENT PLACEMENT  03/13/2017  . CORONARY STENT INTERVENTION N/A 03/13/2017   Procedure: CORONARY STENT INTERVENTION;  Surgeon: Jettie Booze, MD;  Location: [facility];  Service: Cardiovascular;  Laterality: N/A;  . EYE SURGERY Right    "laser; for glaucoma" (03/13/2017)  . FASCIOTOMY Right   . HERNIA REPAIR    . LAPAROSCOPIC CHOLECYSTECTOMY    . LEFT HEART CATH AND CORONARY ANGIOGRAPHY N/A 03/13/2017   Procedure: LEFT HEART CATH AND CORONARY ANGIOGRAPHY;  Surgeon: Jettie Booze, MD;  Location: [facility];  Service: Cardiovascular;  Laterality: N/A;  . PENILE PROSTHESIS IMPLANT  01/2011   Archie Endo 02/01/2011  . PERIPHERAL VASCULAR CATHETERIZATION Right 1999   "had blood clots  in my leg; went in to open them up; dye went into leg; ended up having a fasiotomy"  . TONSILLECTOMY      Allergies  Allergen Reactions  . Benztropine Anaphylaxis  . Codeine Anaphylaxis  . Meperidine Swelling  . Cyclobenzaprine Other (See Comments)    Pt. Does not remember  . Penicillins Rash    Has patient had a PCN reaction causing immediate rash, facial/tongue/throat swelling, SOB or lightheadedness with hypotension: Yes Has patient had a PCN reaction causing severe rash involving mucus membranes or skin necrosis: No Has patient had a PCN reaction that required hospitalization No Has patient had a PCN reaction occurring within the last 10 years: No If all of the above answers are "NO", then may proceed with Cephalosporin use.   . Sulfamethoxazole-Trimethoprim     REACTION: hives  . Sulfonamide Derivatives     REACTION: hives    Immunization History  Administered Date(s) Administered  . Influenza Split 04/06/2011, 03/12/2012, 03/31/2015  . Influenza Whole 05/10/2009  . Influenza,inj,Quad PF,6+ Mos 03/04/2013, 04/02/2017  . Influenza-Unspecified 04/19/2014  .  Pneumococcal Conjugate-13 01/27/2009  . Pneumococcal Polysaccharide-23 06/19/2008    Family History  Problem Relation Age of Onset  . Hypertension Mother   . Heart disease Mother   . Hypertension Father   . Heart disease Father   . Lung cancer Brother        2006     Current Outpatient Medications:  .  albuterol (PROVENTIL) (2.5 MG/3ML) 0.083% nebulizer solution, Take 3 mLs (2.5 mg total) by nebulization every 6 (six) hours as needed for wheezing or shortness of breath., Disp: 120 mL, Rfl: 12 .  alfuzosin (UROXATRAL) 10 MG 24 hr tablet, Take 10 mg by mouth at bedtime. , Disp: , Rfl:  .  amLODipine (NORVASC) 5 MG tablet, Take 1 tablet (5 mg total) by mouth daily., Disp: 90 tablet, Rfl: 3 .  aspirin EC 81 MG tablet, Take 81 mg by mouth daily., Disp: , Rfl:  .  bimatoprost (LUMIGAN) 0.01 % SOLN, Place 1 drop into  the left eye at bedtime., Disp: , Rfl:  .  brimonidine (ALPHAGAN) 0.15 % ophthalmic solution, Place 1 drop into the left eye 2 (two) times daily. , Disp: , Rfl:  .  cholestyramine (QUESTRAN) 4 G packet, Take 1 packet by mouth daily. , Disp: , Rfl:  .  clonazePAM (KLONOPIN) 1 MG tablet, Take 1 mg by mouth at bedtime. Anxiety, Disp: , Rfl:  .  clopidogrel (PLAVIX) 75 MG tablet, Take 1 tablet (75 mg total) by mouth daily with breakfast., Disp: 90 tablet, Rfl: 3 .  dicyclomine (BENTYL) 10 MG capsule, Take 10 mg by mouth 4 (four) times daily -  before meals and at bedtime. , Disp: , Rfl:  .  diphenhydrAMINE (BENADRYL) 25 MG tablet, Take 50 mg by mouth at bedtime. , Disp: , Rfl:  .  esomeprazole (NEXIUM) 20 MG capsule, Take 20 mg by mouth daily., Disp: , Rfl:  .  finasteride (PROSCAR) 5 MG tablet, Take 5 mg by mouth daily., Disp: , Rfl:  .  isosorbide mononitrate (IMDUR) 30 MG 24 hr tablet, Take 1 tablet (30 mg total) by mouth daily., Disp: 90 tablet, Rfl: 3 .  loxapine (LOXITANE) 10 MG capsule, Take 10 mg by mouth 2 (two) times daily. 1 tab in AM and 2 tabs in PM, Disp: , Rfl:  .  mercaptopurine (PURINETHOL) 50 MG tablet, Take 75 mg by mouth daily. 1 tablet and a half tablet Give on an empty stomach 1 hour before or 2 hours after meals. Caution: Chemotherapy., Disp: , Rfl:  .  mesalamine (PENTASA) 250 MG CR capsule, Take 1,000 mg by mouth 4 (four) times daily. , Disp: , Rfl:  .  methocarbamol (ROBAXIN) 500 MG tablet, Take 1 tablet (500 mg total) by mouth 2 (two) times daily., Disp: 20 tablet, Rfl: 0 .  mirabegron ER (MYRBETRIQ) 50 MG TB24 tablet, Take 50 mg by mouth daily., Disp: , Rfl:  .  mirtazapine (REMERON) 30 MG tablet, Take 30 mg by mouth at bedtime. , Disp: , Rfl:  .  ondansetron (ZOFRAN) 8 MG tablet, Take 8 mg by mouth 2 (two) times daily., Disp: , Rfl:  .  pantoprazole (PROTONIX) 40 MG tablet, Take 1 tablet (40 mg total) by mouth 2 (two) times daily., Disp: 60 tablet, Rfl: 6 .  potassium  chloride (K-DUR) 10 MEQ tablet, Take 1 tablet (10 mEq total) by mouth 2 (two) times daily., Disp: 180 tablet, Rfl: 3 .  PROAIR HFA 108 (90 Base) MCG/ACT inhaler, INHALE 2 PUFFS BY MOUTH  INTO THE LUNGS EVERY 6 HOURS AS NEEDED FOR WHEEZING OR SHORTNESS OF BREATH, Disp: 8.5 g, Rfl: 5 .  rosuvastatin (CRESTOR) 20 MG tablet, Take 1 tablet (20 mg total) by mouth daily., Disp: 90 tablet, Rfl: 3 .  SPIRIVA HANDIHALER 18 MCG inhalation capsule, INHALE THE CONTENT OF 1 CAPSULE VIA HANDIHALER DAILY, Disp: 30 capsule, Rfl: 0 .  timolol (TIMOPTIC) 0.5 % ophthalmic solution, Place 1 drop into the left eye 2 (two) times daily., Disp: , Rfl:  .  traZODone (DESYREL) 50 MG tablet, Take 150 mg by mouth at bedtime., Disp: , Rfl:  .  nitroGLYCERIN (NITROSTAT) 0.4 MG SL tablet, Place 1 tablet (0.4 mg total) under the tongue every 5 (five) minutes x 3 doses as needed for chest pain. (Patient not taking: Reported on 10/16/2017), Disp: 25 tablet, Rfl: 3   Review of Systems     Objective:   Physical Exam  Constitutional: He is oriented to person, place, and time. He appears well-developed and well-nourished. No distress.  HENT:  Head: Normocephalic and atraumatic.  Right Ear: External ear normal.  Left Ear: External ear normal.  Mouth/Throat: Oropharynx is clear and moist. No oropharyngeal exudate.  Eyes: Pupils are equal, round, and reactive to light. Conjunctivae and EOM are normal. Right eye exhibits no discharge. Left eye exhibits no discharge. No scleral icterus.  Neck: Normal range of motion. Neck supple. No JVD present. No tracheal deviation present. No thyromegaly present.  Cardiovascular: Normal rate, regular rhythm and intact distal pulses. Exam reveals no gallop and no friction rub.  No murmur heard. Pulmonary/Chest: Effort normal and breath sounds normal. No respiratory distress. He has no wheezes. He has no rales. He exhibits no tenderness.  Abdominal: Soft. Bowel sounds are normal. He exhibits no  distension and no mass. There is no tenderness. There is no rebound and no guarding.  Musculoskeletal: Normal range of motion. He exhibits no edema or tenderness.  Lymphadenopathy:    He has no cervical adenopathy.  Neurological: He is alert and oriented to person, place, and time. He has normal reflexes. No cranial nerve deficit. Coordination normal.  Skin: Skin is warm and dry. No rash noted. He is not diaphoretic. No erythema. No pallor.  Psychiatric: He has a normal mood and affect. His behavior is normal. Judgment and thought content normal.  Nursing note and vitals reviewed.  Vitals:   10/16/17 1147  BP: 124/74  Pulse: (!) 103  SpO2: 98%  Weight: 206 lb 9.6 oz (93.7 kg)  Height: 5' 11"  (1.803 m)    Estimated body mass index is 28.81 kg/m as calculated from the following:   Height as of this encounter: 5' 11"  (1.803 m).   Weight as of this encounter: 206 lb 9.6 oz (93.7 kg).     Assessment:       ICD-10-CM   1. Tobacco use Z72.0   2. Encounter for screening for lung cancer Z12.2   3. Other emphysema ([place]) J43.8        Plan:     Tobacco use  - please quit esp with ongoing heart disease  Encounter for screening for lung cancer - no cancer CT chest April 2019 - nex CT chest low dose May 2020  Other emphysema (H[facility]) - stable but you need to take spiriva daily; please do that  Followup 9 months or sooner; CAT score at followup   Dr. Brand Males, M.D., [facility].C.P Pulmonary and Critical Care Medicine Staff Physician, [facility] Director -  Interstitial Lung Disease  Program  Pulmonary [place] at [place], [place], [address]  Pager: [phone], If no answer or between  15:00h - 7:00h: call [phone] Telephone: [phone]

## 2017-10-17 NOTE — Telephone Encounter (Signed)
Outpatient Medication Detail    Disp Refills Start End   nitroGLYCERIN (NITROSTAT) 0.4 MG SL tablet 25 tablet 3 09/20/2017    Sig - Route: Place 1 tablet (0.4 mg total) under the tongue every 5 (five) minutes x 3 doses as needed for chest pain. - Sublingual   Patient not taking: Reported on 10/16/2017       Sent to pharmacy as: nitroGLYCERIN (NITROSTAT) 0.4 MG SL tablet   E-Prescribing Status: Receipt confirmed by pharmacy (09/20/2017 10:19 AM EDT)   Pharmacy   [id number] - [place], [place] - [address] AT [facility] OF [address]

## 2017-10-20 ENCOUNTER — Other Ambulatory Visit: Payer: Self-pay | Admitting: Internal Medicine

## 2017-11-02 ENCOUNTER — Other Ambulatory Visit: Payer: Medicare Other

## 2017-11-05 ENCOUNTER — Other Ambulatory Visit (INDEPENDENT_AMBULATORY_CARE_PROVIDER_SITE_OTHER): Payer: Medicare Other

## 2017-11-05 ENCOUNTER — Encounter: Payer: Self-pay | Admitting: Internal Medicine

## 2017-11-05 ENCOUNTER — Ambulatory Visit (INDEPENDENT_AMBULATORY_CARE_PROVIDER_SITE_OTHER): Payer: Medicare Other | Admitting: Internal Medicine

## 2017-11-05 VITALS — BP 132/80 | HR 97 | Ht 71.0 in | Wt 204.6 lb

## 2017-11-05 DIAGNOSIS — Z95820 Peripheral vascular angioplasty status with implants and grafts: Secondary | ICD-10-CM

## 2017-11-05 DIAGNOSIS — R05 Cough: Secondary | ICD-10-CM

## 2017-11-05 DIAGNOSIS — R059 Cough, unspecified: Secondary | ICD-10-CM

## 2017-11-05 DIAGNOSIS — J441 Chronic obstructive pulmonary disease with (acute) exacerbation: Secondary | ICD-10-CM | POA: Diagnosis not present

## 2017-11-05 DIAGNOSIS — R0601 Orthopnea: Secondary | ICD-10-CM | POA: Diagnosis not present

## 2017-11-05 LAB — TROPONIN I: TNIDX: 0.01 ug/l (ref 0.00–0.06)

## 2017-11-05 MED ORDER — PREDNISONE 10 MG PO TABS: ORAL_TABLET | ORAL | 0 refills | Status: DC

## 2017-11-05 MED ORDER — DOXYCYCLINE HYCLATE 100 MG PO TABS: 100.0000 mg | ORAL_TABLET | Freq: Two times a day (BID) | ORAL | 0 refills | Status: DC

## 2017-11-05 NOTE — Progress Notes (Signed)
Subjective:     Patient ID: Chad Moss, male   DOB: [birth year], 62 y.o.   MRN: [id number]  HPI  62 year old smoker with  emphysema  Chief Complaint  Patient presents with  . Acute Visit    Sob, when he lays down at night, cannot breath, dry, non-productive cough, throat itches, no chest tightness, pain in right side of the chest this morning.      He continues to smoke Accompanied by his girlfriend/caregiver-reports sudden onset worsening of his cough for the last 2-3 days, dry, complains of right-sided chest pain when he coughs which is episodic, and resolved spontaneously. He has not tried any over-the-counter medications Denies preceding URI symptoms or fever No pedal edema, orthopnea or paroxysmal nocturnal dyspnea, no chest pain on exertion  He is compliant with albuterol and this seems to relieve her symptoms  Significant tests/ events Spirometry 07/2015 Normal FEV1 but diffusion capacity is low at 56% and therefore he is isolated low diffusion capacity account of his emphysema   Screening CT chest/2017-  stable 4 mm right subpleural nodule   OV 01/12/2017  Chief Complaint  Patient presents with  . Follow-up    Pt c/o increase in dry cough. Pt denies chest congestion, f/c/s, CP/tightness. Pt states he feels his breathing is doing well.      Follow-up for he is here with his caretaker  Smoking: He continues to smoke. Because of his paranoid schizophrenia is unable to quit.  New finding: CT scan of the chest done for lung scan (screening shows coronary artery calcification. According to the caretaker he had a stress test many years ago. He does not have chest pain within the caretaker said that some years ago he did have chest pain. They're interested in seeing Dr Irish Lack o   Emphysema: He is on Spiriva but he is also taking his albuterol daily. He says that this is because of cough which is helped with albuterol. He is open to up- scaling his scheduled  inhaler  Lung cancer screening: He had a benign CT scan April 2018 as documented below. Next CT scan is -2019 IMPRESSION: 1. Lung-RADS Category 2, benign appearance or behavior. Continue annual screening with low-dose chest CT without contrast in 12 months. 2. Aortic atherosclerosis (ICD10-170.0). Coronary artery calcification.   Electronically Signed   By: Lorin Picket M.D.   On: 10/02/2016 12:54   OV 10/16/2017  Chief Complaint  Patient presents with  . Follow-up    lung ca screen ct 4/17. Pt states he has been doing good. States he becomes SOB at night when laying down to sleep but when pt gets up, it goes away.   FU smoking: he continues to smoke. He does not fully admit to the extent that he smoked according to think is working quite a lot.  Follow-up COPD: COPD cat score is 9. The cough is slightly worse than baseline. The caretaker tells me that he is not compliant with the Spiriva anymore. Other no other symptoms of exacerbation.  Follow up lung cancer screening: He had low-dose CT scan of the chest April 2019 Review of the result. This no lung nodule or cancer. One-year follow-up is recommended.  past medical history: Last visit referred him to cardiology for coronary artery calcification. And in September 2018 status post stent. He says after this chest pain is resolved.   OV 11/05/2017  Chief Complaint  Patient presents with  . Acute Visit    reports SOB x 2 nights  while laying flat on back and side. Reports orthopnea. Has not smoked in last 2 days. Compliant with inhalers and nebs.     62 year old male who I saw recently for stable COPD.  He presents with his caretaker Actuary.  He tells me for the last 2 days he has had increased cough but without any sputum or chest tightness or chest pain.  There is associated orthopnea but no exertional dyspnea or exertional chest pain or wheezing.  Albuterol has helped his symptoms.  He does not feel sick.  He did  have a cardiac stent in September 2018 according to his history but at that time symptoms were chest pain that was exertional and exertional shortness of breath.  None of that exist right now.  The orthopnea is clearly new   CAT COPD Symptom & Quality of Life Score (GSK trademark) 0 is no burden. 5 is highest burden 10/16/2017   Never Cough -> Cough all the time 3  No phlegm in chest -> Chest is full of phlegm 0  No chest tightness -> Chest feels very tight 0  No dyspnea for 1 flight stairs/hill -> Very dyspneic for 1 flight of stairs 0  No limitations for ADL at home -> Very limited with ADL at home 0  Confident leaving home -> Not at all confident leaving home 0  Sleep soundly -> Do not sleep soundly because of lung condition 3  Lots of Energy -> No energy at all 3  TOTAL Score (max 40)  9      LDC CT chest April 2019 IMPRESSION: 1. The Lung-RADS 2, benign appearance or behavior. Continue annual screening with low-dose chest CT without contrast in 12 months. 2.  Aortic Atherosclerois (ICD10-170.0)   Electronically Signed   By: Misty Stanley M.D.   On: 10/03/2017 16:59     has a past medical history of Allergic rhinitis, Anxiety, Atelectasis, CAD (coronary artery disease), native coronary artery, Chronic bronchitis ([place]), COPD (chronic obstructive pulmonary disease) ([place]), Crohn's disease ([place]), Depression, DVT (deep venous thrombosis) ([place]), Dysphagia, Dyspnea, Enlarged prostate, GERD (gastroesophageal reflux disease), Glaucoma, both eyes, Heart murmur, History of stomach ulcers (1980s), Hyperlipidemia, Hypertension, IBS (irritable bowel syndrome), Paranoid schizophrenia ([place]), Peripheral vascular disease ([place]), Pneumonia (1990s), Pre-diabetes, Stroke ([place]) (04/2016), and Syncopal episodes.   reports that he has been smoking cigarettes.  He has a 47.00 pack-year smoking history. He has never used smokeless tobacco.  Past Surgical History:  Procedure Laterality Date  .  ABDOMINAL HERNIA REPAIR  1990s  . COLECTOMY  1985; ?1989; 1996   "part of my ileum removed"  . CORONARY ANGIOPLASTY WITH STENT PLACEMENT  03/13/2017  . CORONARY STENT INTERVENTION N/A 03/13/2017   Procedure: CORONARY STENT INTERVENTION;  Surgeon: Jettie Booze, MD;  Location: [facility];  Service: Cardiovascular;  Laterality: N/A;  . EYE SURGERY Right    "laser; for glaucoma" (03/13/2017)  . FASCIOTOMY Right   . HERNIA REPAIR    . LAPAROSCOPIC CHOLECYSTECTOMY    . LEFT HEART CATH AND CORONARY ANGIOGRAPHY N/A 03/13/2017   Procedure: LEFT HEART CATH AND CORONARY ANGIOGRAPHY;  Surgeon: Jettie Booze, MD;  Location: [facility];  Service: Cardiovascular;  Laterality: N/A;  . PENILE PROSTHESIS IMPLANT  01/2011   Archie Endo 02/01/2011  . PERIPHERAL VASCULAR CATHETERIZATION Right 1999   "had blood clots in my leg; went in to open them up; dye went into leg; ended up having a fasiotomy"  . TONSILLECTOMY  Allergies  Allergen Reactions  . Benztropine Anaphylaxis  . Codeine Anaphylaxis  . Meperidine Swelling  . Cyclobenzaprine Other (See Comments)    Pt. Does not remember  . Penicillins Rash    Has patient had a PCN reaction causing immediate rash, facial/tongue/throat swelling, SOB or lightheadedness with hypotension: Yes Has patient had a PCN reaction causing severe rash involving mucus membranes or skin necrosis: No Has patient had a PCN reaction that required hospitalization No Has patient had a PCN reaction occurring within the last 10 years: No If all of the above answers are "NO", then may proceed with Cephalosporin use.   . Sulfamethoxazole-Trimethoprim     REACTION: hives  . Sulfonamide Derivatives     REACTION: hives    Immunization History  Administered Date(s) Administered  . Influenza Split 04/06/2011, 03/12/2012, 03/31/2015  . Influenza Whole 05/10/2009  . Influenza,inj,Quad PF,6+ Mos 03/04/2013, 04/02/2017  . Influenza-Unspecified 04/19/2014   . Pneumococcal Conjugate-13 01/27/2009  . Pneumococcal Polysaccharide-23 06/19/2008    Family History  Problem Relation Age of Onset  . Hypertension Mother   . Heart disease Mother   . Hypertension Father   . Heart disease Father   . Lung cancer Brother        2006     Current Outpatient Medications:  .  albuterol (PROVENTIL) (2.5 MG/3ML) 0.083% nebulizer solution, Take 3 mLs (2.5 mg total) by nebulization every 6 (six) hours as needed for wheezing or shortness of breath., Disp: 120 mL, Rfl: 12 .  alfuzosin (UROXATRAL) 10 MG 24 hr tablet, Take 10 mg by mouth at bedtime. , Disp: , Rfl:  .  amLODipine (NORVASC) 5 MG tablet, Take 1 tablet (5 mg total) by mouth daily., Disp: 90 tablet, Rfl: 3 .  aspirin EC 81 MG tablet, Take 81 mg by mouth daily., Disp: , Rfl:  .  bimatoprost (LUMIGAN) 0.01 % SOLN, Place 1 drop into the left eye at bedtime., Disp: , Rfl:  .  brimonidine (ALPHAGAN) 0.15 % ophthalmic solution, Place 1 drop into the left eye 2 (two) times daily. , Disp: , Rfl:  .  cholestyramine (QUESTRAN) 4 G packet, Take 1 packet by mouth daily. , Disp: , Rfl:  .  clonazePAM (KLONOPIN) 1 MG tablet, Take 1 mg by mouth at bedtime. Anxiety, Disp: , Rfl:  .  clopidogrel (PLAVIX) 75 MG tablet, Take 1 tablet (75 mg total) by mouth daily with breakfast., Disp: 90 tablet, Rfl: 3 .  dicyclomine (BENTYL) 10 MG capsule, Take 10 mg by mouth 4 (four) times daily -  before meals and at bedtime. , Disp: , Rfl:  .  diphenhydrAMINE (BENADRYL) 25 MG tablet, Take 50 mg by mouth at bedtime. , Disp: , Rfl:  .  finasteride (PROSCAR) 5 MG tablet, Take 5 mg by mouth daily., Disp: , Rfl:  .  loxapine (LOXITANE) 10 MG capsule, Take 10 mg by mouth 2 (two) times daily. 1 tab in AM and 2 tabs in PM, Disp: , Rfl:  .  mercaptopurine (PURINETHOL) 50 MG tablet, Take 75 mg by mouth daily. 1 tablet and a half tablet Give on an empty stomach 1 hour before or 2 hours after meals. Caution: Chemotherapy., Disp: , Rfl:  .   mesalamine (PENTASA) 250 MG CR capsule, Take 1,000 mg by mouth 4 (four) times daily. , Disp: , Rfl:  .  methocarbamol (ROBAXIN) 500 MG tablet, Take 1 tablet (500 mg total) by mouth 2 (two) times daily., Disp: 20 tablet, Rfl: 0 .  mirabegron ER (MYRBETRIQ) 50 MG TB24 tablet, Take 50 mg by mouth daily., Disp: , Rfl:  .  mirtazapine (REMERON) 30 MG tablet, Take 30 mg by mouth at bedtime. , Disp: , Rfl:  .  nitroGLYCERIN (NITROSTAT) 0.4 MG SL tablet, Place 1 tablet (0.4 mg total) under the tongue every 5 (five) minutes x 3 doses as needed for chest pain., Disp: 25 tablet, Rfl: 3 .  pantoprazole (PROTONIX) 40 MG tablet, Take 1 tablet (40 mg total) by mouth 2 (two) times daily., Disp: 60 tablet, Rfl: 6 .  potassium chloride (K-DUR) 10 MEQ tablet, Take 1 tablet (10 mEq total) by mouth 2 (two) times daily., Disp: 180 tablet, Rfl: 3 .  PROAIR HFA 108 (90 Base) MCG/ACT inhaler, INHALE 2 PUFFS BY MOUTH INTO THE LUNGS EVERY 6 HOURS AS NEEDED FOR WHEEZING OR SHORTNESS OF BREATH, Disp: 8.5 g, Rfl: 0 .  SPIRIVA HANDIHALER 18 MCG inhalation capsule, INHALE THE CONTENT OF 1 CAPSULE VIA HANDIHALER DAILY, Disp: 30 capsule, Rfl: 0 .  timolol (TIMOPTIC) 0.5 % ophthalmic solution, Place 1 drop into the left eye 2 (two) times daily., Disp: , Rfl:  .  traZODone (DESYREL) 50 MG tablet, Take 150 mg by mouth at bedtime., Disp: , Rfl:  .  isosorbide mononitrate (IMDUR) 30 MG 24 hr tablet, Take 1 tablet (30 mg total) by mouth daily., Disp: 90 tablet, Rfl: 3 .  rosuvastatin (CRESTOR) 20 MG tablet, Take 1 tablet (20 mg total) by mouth daily., Disp: 90 tablet, Rfl: 3   Review of Systems     Objective:   Physical Exam  Constitutional: He is oriented to person, place, and time. He appears well-developed and well-nourished. No distress.  HENT:  Head: Normocephalic and atraumatic.  Right Ear: External ear normal.  Left Ear: External ear normal.  Mouth/Throat: Oropharynx is clear and moist. No oropharyngeal exudate.  Eyes:  Pupils are equal, round, and reactive to light. Conjunctivae and EOM are normal. Right eye exhibits no discharge. Left eye exhibits no discharge. No scleral icterus.  Neck: Normal range of motion. Neck supple. No JVD present. No tracheal deviation present. No thyromegaly present.  Cardiovascular: Normal rate, regular rhythm and intact distal pulses. Exam reveals no gallop and no friction rub.  No murmur heard. Pulmonary/Chest: Effort normal and breath sounds normal. No respiratory distress. He has no wheezes. He has no rales. He exhibits no tenderness.  Abdominal: Soft. Bowel sounds are normal. He exhibits no distension and no mass. There is no tenderness. There is no rebound and no guarding.  Musculoskeletal: Normal range of motion. He exhibits no edema or tenderness.  Lymphadenopathy:    He has no cervical adenopathy.  Neurological: He is alert and oriented to person, place, and time. He has normal reflexes. No cranial nerve deficit. Coordination normal.  Skin: Skin is warm and dry. No rash noted. He is not diaphoretic. No erythema. No pallor.  Psychiatric: He has a normal mood and affect. His behavior is normal. Judgment and thought content normal.  Nursing note and vitals reviewed.  Vitals:   11/05/17 1147  BP: 132/80  Pulse: 97  SpO2: 97%  Weight: 204 lb 9.6 oz (92.8 kg)  Height: 5' 11"  (1.803 m)    Estimated body mass index is 28.54 kg/m as calculated from the following:   Height as of this encounter: 5' 11"  (1.803 m).   Weight as of this encounter: 204 lb 9.6 oz (92.8 kg).     Assessment:  ICD-10-CM   1. COPD with acute exacerbation ([place]) J44.1   2. Status post angioplasty with stent Z95.820   3. Orthopnea R06.01   4. Cough R05    Is a new issue of orthopnea.    Plan:       We will treat as copd flare up But be aware that symptoms might be heart related though very low probability  Plan Do troponin blood work 11/05/2017 (order stat)  Please take prednisone  40 mg x1 day, then 30 mg x1 day, then 20 mg x1 day, then 10 mg x1 day, and then 5 mg x1 day and stop  Take doxycycline 122m po twice daily x 5 days; take after meals and avoid sunlight  Followup - wil call with results -  to ER if unimproved - otherwise return as before     Dr. MBrand Males M.D., FCrichton Rehabilitation CenterC.P Pulmonary and Critical Care Medicine Staff Physician, CAtwaterDirector - Interstitial Lung Disease  Program  [place] [address] Pager: [phone] If no answer or between  15:00h - 7:00h: call [phone] Telephone: [phone]

## 2017-11-05 NOTE — Patient Instructions (Addendum)
ICD-10-CM   1. COPD with acute exacerbation ([place]) J44.1   2. Status post angioplasty with stent Z95.820   3. Orthopnea R06.01   4. Cough R05     We will treat as copd flare up But be aware that symptoms might be heart related though very low probability  Plan Do troponin blood work 11/05/2017 (order stat)  Please take prednisone 40 mg x1 day, then 30 mg x1 day, then 20 mg x1 day, then 10 mg x1 day, and then 5 mg x1 day and stop  Take doxycycline 138m po twice daily x 5 days; take after meals and avoid sunlight  Followup - wil call with results -  to ER if unimproved - otherwise return as before

## 2017-11-13 ENCOUNTER — Telehealth: Payer: Self-pay | Admitting: Internal Medicine

## 2017-11-13 NOTE — Telephone Encounter (Signed)
ATC patient again, he did not answer. VM is not setup. Will try again later.

## 2017-11-13 NOTE — Telephone Encounter (Signed)
Repeat prednisone  Please take prednisone 40 mg x1 day, then 30 mg x1 day, then 20 mg x1 day, then 10 mg x1 day, and then 5 mg x1 day and stop  If symptoms persist, get worse, recur - > go To ER   Dr. Brand Males, M.D., [facility].C.P Pulmonary and Critical Care Medicine Staff Physician, [place] Director - Interstitial Lung Disease  Program  Pulmonary [place], [place], [address]  Pager: [phone], If no answer or between  15:00h - 7:00h: call [phone] Telephone: [place]  . Benztropine Anaphylaxis  . Codeine Anaphylaxis  . Meperidine Swelling  . Cyclobenzaprine Other (See Comments)    Pt. Does not remember  . Penicillins Rash    Has patient had a PCN reaction causing immediate rash, facial/tongue/throat swelling, SOB or lightheadedness with hypotension: Yes Has patient had a PCN reaction causing severe rash involving mucus membranes or skin necrosis: No Has patient had a PCN reaction that required hospitalization No Has patient had a PCN reaction occurring within the last 10 years: No If all of the above answers are "NO", then may proceed with Cephalosporin use.   . Sulfamethoxazole-Trimethoprim     REACTION: hives  . Sulfonamide Derivatives     REACTION: hives

## 2017-11-13 NOTE — Telephone Encounter (Signed)
atc pt, no answer and vm not set up.  Will call in rx after speaking to pt.

## 2017-11-13 NOTE — Telephone Encounter (Signed)
Pt c/o increased sob worse with exertion and laying down.  Pt was seen 5/20 and given a pred taper and doxycycline for these same symptoms.  Pt started feeling better while on these meds, so pt started smoking again.  After pt started smoking again, s/s resumed.  Pt is requesting additional recs.    Pharmacy: [facility] on [address].  MR please advise.  Thanks!

## 2017-11-14 ENCOUNTER — Other Ambulatory Visit: Payer: Self-pay | Admitting: Interventional Cardiology

## 2017-11-14 MED ORDER — PREDNISONE 10 MG PO TABS: ORAL_TABLET | ORAL | 0 refills | Status: DC

## 2017-11-14 NOTE — Telephone Encounter (Signed)
ATC patient, no response again. VM is still not setup.   Spoke with Chad Moss (EC). She stated that sometimes the patient will not answer his phone, she prefers to have her number listed as the main contact.   She is aware that the medication will be called in for him. Nothing else needed at time of call.

## 2017-11-20 ENCOUNTER — Other Ambulatory Visit: Payer: Self-pay | Admitting: Internal Medicine

## 2017-12-27 LAB — BASIC METABOLIC PANEL
BUN: 6 (ref 4–21)
Creatinine: 1.1 (ref <=1.3)
Glucose: 105
Potassium: 4.1 (ref 3.4–5.3)
Sodium: 142 (ref 137–147)

## 2017-12-27 LAB — LIPID PANEL
Cholesterol: 96 (ref 0–200)
HDL: 33 — AB (ref 35–70)
LDL Cholesterol: 36
LDl/HDL Ratio: 1.1
Triglycerides: 136 (ref 40–160)

## 2018-01-21 ENCOUNTER — Other Ambulatory Visit: Payer: Self-pay | Admitting: Internal Medicine

## 2018-02-19 ENCOUNTER — Encounter: Payer: Self-pay | Admitting: Nurse Practitioner

## 2018-02-19 ENCOUNTER — Ambulatory Visit (INDEPENDENT_AMBULATORY_CARE_PROVIDER_SITE_OTHER): Payer: Medicare Other | Admitting: Nurse Practitioner

## 2018-02-19 DIAGNOSIS — J309 Allergic rhinitis, unspecified: Secondary | ICD-10-CM

## 2018-02-19 DIAGNOSIS — J0181 Other acute recurrent sinusitis: Secondary | ICD-10-CM | POA: Diagnosis not present

## 2018-02-19 MED ORDER — PREDNISONE 10 MG PO TABS: ORAL_TABLET | ORAL | 0 refills | Status: DC

## 2018-02-19 MED ORDER — AZITHROMYCIN 250 MG PO TABS: ORAL_TABLET | ORAL | 0 refills | Status: DC

## 2018-02-19 NOTE — Assessment & Plan Note (Signed)
Continue current medications. 

## 2018-02-19 NOTE — Progress Notes (Signed)
@Patient  ID: Chad Moss, male    DOB: April 16, 1956, 62 y.o.   MRN: [id number]  Chief Complaint  Patient presents with  . Cough    Yellow and green congestion since Sunday    Referring provider: Dorothyann Peng, MD  HPI  62 year old male current smoker with emphysema followed by Dr. Marchelle Gearing.    Tests:   Significant tests/ events Spirometry 07/2015 Normal FEV1 but diffusion capacity is low at 56% and therefore he is isolated low diffusion capacity account of his emphysema   Screening CT chest/2017-  stable 4 mm right subpleural nodule Imaging:   OV 02/19/18 - acute sick visit Patient presents today with sinus congestion, post nasal drip which patient reports is causing cough. Yellow sinus drainage. Also complains of sinus pressure and pain. Symptoms started last Friday and have progressively worsened. Denies any fever or nausea.     Allergies  Allergen Reactions  . Benztropine Anaphylaxis  . Codeine Anaphylaxis  . Meperidine Swelling  . Cyclobenzaprine Other (See Comments)    Pt. Does not remember  . Penicillins Rash    Has patient had a PCN reaction causing immediate rash, facial/tongue/throat swelling, SOB or lightheadedness with hypotension: Yes Has patient had a PCN reaction causing severe rash involving mucus membranes or skin necrosis: No Has patient had a PCN reaction that required hospitalization No Has patient had a PCN reaction occurring within the last 10 years: No If all of the above answers are "NO", then may proceed with Cephalosporin use.   . Sulfamethoxazole-Trimethoprim     REACTION: hives  . Sulfonamide Derivatives     REACTION: hives    Immunization History  Administered Date(s) Administered  . Influenza Split 04/06/2011, 03/12/2012, 03/31/2015  . Influenza Whole 05/10/2009  . Influenza,inj,Quad PF,6+ Mos 03/04/2013, 04/02/2017  . Influenza-Unspecified 04/19/2014  . Pneumococcal Conjugate-13 01/27/2009  . Pneumococcal Polysaccharide-23  06/19/2008    Past Medical History:  Diagnosis Date  . Allergic rhinitis   . Anxiety   . Atelectasis   . CAD (coronary artery disease), native coronary artery    9/18 PCI/DES x1 to mLCx, mild diffuse nonobstructive disease, EF 55% on Lv gram  . Chronic bronchitis (HCC)   . COPD (chronic obstructive pulmonary disease) (HCC)   . Crohn's disease (HCC)   . Depression   . DVT (deep venous thrombosis) (HCC)    RLE X 2  . Dysphagia   . Dyspnea   . Enlarged prostate   . GERD (gastroesophageal reflux disease)   . Glaucoma, both eyes   . Heart murmur   . History of stomach ulcers 1980s  . Hyperlipidemia   . Hypertension    "off RX for years now cause of coughing w/Lisinopril" (03/13/2017)  . IBS (irritable bowel syndrome)   . Paranoid schizophrenia (HCC)   . Peripheral vascular disease (HCC)   . Pneumonia 1990s  . Pre-diabetes    "one time" (03/13/2017)  . Stroke [facility]) 04/2016   "eye stroke; left eye" (03/13/2017)  . Syncopal episodes     Tobacco History: Social History   Tobacco Use  Smoking Status Current Every Day Smoker  . Packs/day: 1.00  . Years: 47.00  . Pack years: 47.00  . Types: Cigarettes  Smokeless Tobacco Never Used  Tobacco Comment   Patient given smoking cessation information   Ready to quit: No Counseling given: Yes Comment: Patient given smoking cessation information   Outpatient Encounter Medications as of 02/19/2018  Medication Sig  . albuterol (PROVENTIL) (2.5 MG/3ML)  0.083% nebulizer solution Take 3 mLs (2.5 mg total) by nebulization every 6 (six) hours as needed for wheezing or shortness of breath.  . alfuzosin (UROXATRAL) 10 MG 24 hr tablet Take 10 mg by mouth at bedtime.   Marland Kitchen amLODipine (NORVASC) 5 MG tablet Take 1 tablet (5 mg total) by mouth daily.  Marland Kitchen aspirin EC 81 MG tablet Take 81 mg by mouth daily.  . bimatoprost (LUMIGAN) 0.01 % SOLN Place 1 drop into the left eye at bedtime.  . brimonidine (ALPHAGAN) 0.15 % ophthalmic solution Place 1 drop  into the left eye 2 (two) times daily.   . cholestyramine (QUESTRAN) 4 G packet Take 1 packet by mouth daily.   . clonazePAM (KLONOPIN) 1 MG tablet Take 1 mg by mouth at bedtime. Anxiety  . clopidogrel (PLAVIX) 75 MG tablet Take 1 tablet (75 mg total) by mouth daily with breakfast.  . dicyclomine (BENTYL) 10 MG capsule Take 10 mg by mouth 4 (four) times daily -  before meals and at bedtime.   . diphenhydrAMINE (BENADRYL) 25 MG tablet Take 50 mg by mouth at bedtime.   Marland Kitchen doxycycline (VIBRA-TABS) 100 MG tablet Take 1 tablet (100 mg total) by mouth 2 (two) times daily.  . finasteride (PROSCAR) 5 MG tablet Take 5 mg by mouth daily.  Marland Kitchen loxapine (LOXITANE) 10 MG capsule Take 10 mg by mouth 2 (two) times daily. 1 tab in AM and 2 tabs in PM  . mercaptopurine (PURINETHOL) 50 MG tablet Take 75 mg by mouth daily. 1 tablet and a half tablet Give on an empty stomach 1 hour before or 2 hours after meals. Caution: Chemotherapy.  . mesalamine (PENTASA) 250 MG CR capsule Take 1,000 mg by mouth 4 (four) times daily.   . methocarbamol (ROBAXIN) 500 MG tablet Take 1 tablet (500 mg total) by mouth 2 (two) times daily.  . mirabegron ER (MYRBETRIQ) 50 MG TB24 tablet Take 50 mg by mouth daily.  . mirtazapine (REMERON) 30 MG tablet Take 30 mg by mouth at bedtime.   . nitroGLYCERIN (NITROSTAT) 0.4 MG SL tablet Place 1 tablet (0.4 mg total) under the tongue every 5 (five) minutes x 3 doses as needed for chest pain.  . pantoprazole (PROTONIX) 40 MG tablet Take 1 tablet (40 mg total) by mouth 2 (two) times daily.  . potassium chloride (K-DUR) 10 MEQ tablet Take 1 tablet (10 mEq total) by mouth 2 (two) times daily.  . predniSONE (DELTASONE) 10 MG tablet Take 40mg x1day,30mg x1day,20mg x1day,10mg x1day,5mg x1day,then stop  . PROAIR HFA 108 (90 Base) MCG/ACT inhaler INHALE 2 PUFFS BY MOUTH INTO THE LUNGS EVERY 6 HOURS AS NEEDED FOR WHEEZING OR SHORTNESS OF BREATH  . SPIRIVA HANDIHALER 18 MCG inhalation capsule INHALE THE CONTENT OF 1  CAPSULE VIA HANDIHALER DAILY  . SPIRIVA HANDIHALER 18 MCG inhalation capsule INHALE THE CONTENT OF 1 CAPSULE VIA HANDIHALER DAILY  . timolol (TIMOPTIC) 0.5 % ophthalmic solution Place 1 drop into the left eye 2 (two) times daily.  . traZODone (DESYREL) 50 MG tablet Take 150 mg by mouth at bedtime.  Marland Kitchen azithromycin (ZITHROMAX) 250 MG tablet Take 2 tablets (500mg ) on day 1, then take 1 tablet (250 mg)  on days 2-5  . isosorbide mononitrate (IMDUR) 30 MG 24 hr tablet Take 1 tablet (30 mg total) by mouth daily.  . predniSONE (DELTASONE) 10 MG tablet Take 4 tabs for 2 days, then 3 tabs for 2 days, then 2 tabs for 2 days, then 1 tab for 2 days, then stop  .  rosuvastatin (CRESTOR) 20 MG tablet Take 1 tablet (20 mg total) by mouth daily.   No facility-administered encounter medications on file as of 02/19/2018.      Review of Systems  Review of Systems  Constitutional: Negative.  Negative for chills and fever.  HENT: Positive for congestion, postnasal drip, sinus pressure, sinus pain and sore throat.   Respiratory: Positive for cough. Negative for shortness of breath and wheezing.   Cardiovascular: Negative.   Gastrointestinal: Negative.   Allergic/Immunologic: Negative.   Neurological: Negative.   Psychiatric/Behavioral: Negative.        Physical Exam  BP 134/68 (BP Location: Left Arm, Patient Position: Sitting, Cuff Size: Normal)   Pulse 89   Ht 5\' 11"  (1.803 m)   Wt 220 lb 9.6 oz (100.1 kg)   SpO2 95%   BMI 30.77 kg/m   Wt Readings from Last 5 Encounters:  02/19/18 220 lb 9.6 oz (100.1 kg)  11/05/17 204 lb 9.6 oz (92.8 kg)  10/16/17 206 lb 9.6 oz (93.7 kg)  08/01/17 206 lb 6.4 oz (93.6 kg)  05/29/17 206 lb (93.4 kg)     Physical Exam  Constitutional: He is oriented to person, place, and time. He appears well-developed and well-nourished. No distress.  HENT:  Nose: Right sinus exhibits maxillary sinus tenderness and frontal sinus tenderness. Left sinus exhibits maxillary  sinus tenderness and frontal sinus tenderness.  Mouth/Throat: Posterior oropharyngeal edema present. No oropharyngeal exudate.  Cardiovascular: Normal rate and regular rhythm.  Pulmonary/Chest: Effort normal and breath sounds normal.  Neurological: He is alert and oriented to person, place, and time.  Skin: Skin is warm and dry.  Psychiatric: He has a normal mood and affect.  Nursing note and vitals reviewed.     Assessment & Plan:   Acute sinusitis Patient Instructions  Will order azithromycin and prednisone taper Samples of mucinex given Continue all other medications as ordered Please call or go to the ED if symptoms worsen    Allergic rhinitis Continue current medications     Ivonne Andrew, NP 02/19/2018

## 2018-02-19 NOTE — Patient Instructions (Signed)
Will order azithromycin and prednisone taper Samples of mucinex given Continue all other medications as ordered Please call or go to the ED if symptoms worsen

## 2018-02-19 NOTE — Assessment & Plan Note (Signed)
Patient Instructions  Will order azithromycin and prednisone taper Samples of mucinex given Continue all other medications as ordered Please call or go to the ED if symptoms worsen

## 2018-03-06 ENCOUNTER — Other Ambulatory Visit: Payer: Self-pay | Admitting: Interventional Cardiology

## 2018-03-09 ENCOUNTER — Encounter: Payer: Self-pay | Admitting: Nurse Practitioner

## 2018-03-09 DIAGNOSIS — R609 Edema, unspecified: Secondary | ICD-10-CM | POA: Insufficient documentation

## 2018-03-09 DIAGNOSIS — R635 Abnormal weight gain: Secondary | ICD-10-CM

## 2018-03-09 HISTORY — DX: Abnormal weight gain: R63.5

## 2018-03-09 HISTORY — DX: Edema, unspecified: R60.9

## 2018-03-28 ENCOUNTER — Ambulatory Visit (INDEPENDENT_AMBULATORY_CARE_PROVIDER_SITE_OTHER): Payer: Medicare Other

## 2018-03-28 ENCOUNTER — Ambulatory Visit (INDEPENDENT_AMBULATORY_CARE_PROVIDER_SITE_OTHER): Payer: Medicare Other | Admitting: Internal Medicine

## 2018-03-28 ENCOUNTER — Encounter: Payer: Self-pay | Admitting: Internal Medicine

## 2018-03-28 VITALS — BP 146/82 | HR 86 | Temp 97.7°F | Ht 69.5 in | Wt 224.4 lb

## 2018-03-28 DIAGNOSIS — R609 Edema, unspecified: Secondary | ICD-10-CM

## 2018-03-28 DIAGNOSIS — Z23 Encounter for immunization: Secondary | ICD-10-CM | POA: Diagnosis not present

## 2018-03-28 DIAGNOSIS — Z0000 Encounter for general adult medical examination without abnormal findings: Secondary | ICD-10-CM

## 2018-03-28 DIAGNOSIS — I10 Essential (primary) hypertension: Secondary | ICD-10-CM

## 2018-03-28 DIAGNOSIS — R7303 Prediabetes: Secondary | ICD-10-CM

## 2018-03-28 NOTE — Progress Notes (Addendum)
Subjective:     Patient ID: Chad Moss , male    DOB: 1956/03/10 , 62 y.o.   MRN: [id number]   HTN  Pt is here for HTN FU. His care giver states she is not compliant with his diet, drinks 6 soda's a day, and adds a lot of sugar to his fruit loops cereal. He also sleep on the recliner most times and has been up at night walking around the house. He is given his night time meds around 9 pm and goes to sleep right away, but gets up sometime in the middle of the night. He does nap during  The day. She also mentions he has edema for which he was seen last time and thought Lolita Cram was going to send a fluid pill. Has not been wearing his support socks. They have not purchased them yet.     Past Medical History:  Diagnosis Date  . Allergic rhinitis   . Anxiety   . Atelectasis   . CAD (coronary artery disease), native coronary artery    9/18 PCI/DES x1 to mLCx, mild diffuse nonobstructive disease, EF 55% on Lv gram  . Chronic bronchitis (HCC)   . COPD (chronic obstructive pulmonary disease) (HCC)   . Crohn's disease (HCC)   . Depression   . DVT (deep venous thrombosis) (HCC)    RLE X 2  . Dysphagia   . Dyspnea   . Enlarged prostate   . GERD (gastroesophageal reflux disease)   . Glaucoma, both eyes   . Heart murmur   . History of stomach ulcers 1980s  . Hyperlipidemia   . Hypertension    "off RX for years now cause of coughing w/Lisinopril" (03/13/2017)  . IBS (irritable bowel syndrome)   . Paranoid schizophrenia (HCC)   . Peripheral vascular disease (HCC)   . Pneumonia 1990s  . Pre-diabetes    "one time" (03/13/2017)  . Stroke [facility]) 04/2016   "eye stroke; left eye" (03/13/2017)  . Syncopal episodes     Allergies  Allergen Reactions  . Benztropine Anaphylaxis  . Codeine Anaphylaxis  . Meperidine Swelling  . Cyclobenzaprine Other (See Comments)    Pt. Does not remember  . Penicillins Rash    Has patient had a PCN reaction causing immediate rash, facial/tongue/throat swelling,  SOB or lightheadedness with hypotension: Yes Has patient had a PCN reaction causing severe rash involving mucus membranes or skin necrosis: No Has patient had a PCN reaction that required hospitalization No Has patient had a PCN reaction occurring within the last 10 years: No If all of the above answers are "NO", then may proceed with Cephalosporin use.   . Sulfamethoxazole-Trimethoprim     REACTION: hives  . Sulfonamide Derivatives     REACTION: hives   Outpatient Encounter Medications as of 03/28/2018  Medication Sig Note  . albuterol (PROVENTIL) (2.5 MG/3ML) 0.083% nebulizer solution Take 3 mLs (2.5 mg total) by nebulization every 6 (six) hours as needed for wheezing or shortness of breath.   . alfuzosin (UROXATRAL) 10 MG 24 hr tablet Take 10 mg by mouth at bedtime.    Marland Kitchen amLODipine (NORVASC) 5 MG tablet Take 1 tablet (5 mg total) by mouth daily.   Marland Kitchen aspirin EC 81 MG tablet Take 81 mg by mouth daily.   . bimatoprost (LUMIGAN) 0.01 % SOLN Place 1 drop into the left eye at bedtime.   . brimonidine (ALPHAGAN) 0.15 % ophthalmic solution Place 1 drop into the left eye 2 (two) times daily.    Marland Kitchen  cholestyramine (QUESTRAN) 4 G packet Take 1 packet by mouth daily.    . clonazePAM (KLONOPIN) 1 MG tablet Take 1 mg by mouth at bedtime. Anxiety   . clopidogrel (PLAVIX) 75 MG tablet Take 1 tablet (75 mg total) by mouth daily with breakfast.   . dicyclomine (BENTYL) 10 MG capsule Take 10 mg by mouth 4 (four) times daily -  before meals and at bedtime.    . diphenhydrAMINE (BENADRYL) 25 MG tablet Take 50 mg by mouth at bedtime.    . finasteride (PROSCAR) 5 MG tablet Take 5 mg by mouth daily.   . isosorbide mononitrate (IMDUR) 30 MG 24 hr tablet Take 1 tablet (30 mg total) by mouth daily.   Marland Kitchen loxapine (LOXITANE) 10 MG capsule Take 10 mg by mouth 2 (two) times daily. 1 tab in AM and 2 tabs in PM   . mercaptopurine (PURINETHOL) 50 MG tablet Take 75 mg by mouth daily. 1 tablet and a half tablet Give on an  empty stomach 1 hour before or 2 hours after meals. Caution: Chemotherapy.   . mesalamine (PENTASA) 250 MG CR capsule Take 1,000 mg by mouth 4 (four) times daily.    . methocarbamol (ROBAXIN) 500 MG tablet Take 1 tablet (500 mg total) by mouth 2 (two) times daily.   . mirabegron ER (MYRBETRIQ) 50 MG TB24 tablet Take 50 mg by mouth daily.   . mirtazapine (REMERON) 30 MG tablet Take 30 mg by mouth at bedtime.    . nitroGLYCERIN (NITROSTAT) 0.4 MG SL tablet Place 1 tablet (0.4 mg total) under the tongue every 5 (five) minutes x 3 doses as needed for chest pain.   . pantoprazole (PROTONIX) 40 MG tablet TAKE 1 TABLET(40 MG) BY MOUTH TWICE DAILY   . potassium chloride (K-DUR) 10 MEQ tablet Take 1 tablet (10 mEq total) by mouth 2 (two) times daily.   Marland Kitchen PROAIR HFA 108 (90 Base) MCG/ACT inhaler INHALE 2 PUFFS BY MOUTH INTO THE LUNGS EVERY 6 HOURS AS NEEDED FOR WHEEZING OR SHORTNESS OF BREATH   . rosuvastatin (CRESTOR) 20 MG tablet Take 1 tablet (20 mg total) by mouth daily.   Marland Kitchen SPIRIVA HANDIHALER 18 MCG inhalation capsule INHALE THE CONTENT OF 1 CAPSULE VIA HANDIHALER DAILY   . timolol (TIMOPTIC) 0.5 % ophthalmic solution Place 1 drop into the left eye 2 (two) times daily.   . traZODone (DESYREL) 50 MG tablet Take 150 mg by mouth at bedtime.   . [DISCONTINUED] azithromycin (ZITHROMAX) 250 MG tablet Take 2 tablets (500mg ) on day 1, then take 1 tablet (250 mg)  on days 2-5 (Patient not taking: Reported on 03/28/2018)   . [DISCONTINUED] doxycycline (VIBRA-TABS) 100 MG tablet Take 1 tablet (100 mg total) by mouth 2 (two) times daily. (Patient not taking: Reported on 03/28/2018)   . [DISCONTINUED] predniSONE (DELTASONE) 10 MG tablet Take 40mg x1day,30mg x1day,20mg x1day,10mg x1day,5mg x1day,then stop (Patient not taking: Reported on 03/28/2018)   . [DISCONTINUED] predniSONE (DELTASONE) 10 MG tablet Take 4 tabs for 2 days, then 3 tabs for 2 days, then 2 tabs for 2 days, then 1 tab for 2 days, then stop (Patient not  taking: Reported on 03/28/2018)   . [DISCONTINUED] SPIRIVA HANDIHALER 18 MCG inhalation capsule INHALE THE CONTENT OF 1 CAPSULE VIA HANDIHALER DAILY (Patient not taking: Reported on 03/28/2018) 03/28/2018: duplicate   No facility-administered encounter medications on file as of 03/28/2018.     Review of Systems  Constitutional: Negative for diaphoresis.  Respiratory: Negative for chest tightness and shortness of breath.  Cardiovascular: Positive for leg swelling. Negative for chest pain and palpitations.  Gastrointestinal: Negative for abdominal pain, constipation and diarrhea.  Endocrine: Positive for polydipsia. Negative for polyphagia.  Musculoskeletal: Negative for arthralgias and gait problem.  Skin: Negative for rash and wound.  Neurological: Positive for dizziness. Negative for numbness and headaches.       Sometimes feels dizzy when he gets up.   Psychiatric/Behavioral: Positive for hallucinations. Negative for agitation and behavioral problems.       Pt tells his care giver he hears voices telling him things to do       Objective:  Physical Exam  Constitutional: He is oriented to person, place, and time. He appears well-developed. No distress.  HENT:  Head: Normocephalic and atraumatic.  Right Ear: External ear normal.  Left Ear: External ear normal.  Nose: Nose normal.  Eyes: Conjunctivae are normal. Right eye exhibits no discharge. Left eye exhibits no discharge. No scleral icterus.  Neck: Normal range of motion. Neck supple. No thyromegaly present.  No carotid bruits  Cardiovascular: Normal rate, regular rhythm, normal heart sounds and intact distal pulses.  No murmur heard. +1/4 pitting edema. Has some venostasis skin changes on lower legs  Pulmonary/Chest: Effort normal and breath sounds normal.  Lymphadenopathy:    He has no cervical adenopathy.  Neurological: He is alert and oriented to person, place, and time.  Skin: Skin is warm and dry. Capillary refill  takes less than 2 seconds. No rash noted. He is not diaphoretic. No erythema.  Psychiatric: He has a normal mood and affect. His behavior is normal. Judgment and thought content normal.  Vitals reviewed.   BP= 146/82 P- 86 T- 97.7 Ht- 9.5" Wt- 224 BMI- 32.66  Assessment And Plan:     1. Essential hypertension- not optimal control, CBC and CMP ordered, and he may continue same medications- FU 3 months. Medicare Wellness done today.  2- Prediabetes- HGBA1C ordered. He was advised to d/c so much sugar intake and get off soda 3- Dependent edema- Advised care giver to get him support socks and put them on him first thing in the am. I told pt he needs to stay in bed and have his legs elevated above his heart to prevent more edema. I reviewed his last labs and his BNP was normal. No need for diuretic right now since his edema is mild.   Dara Beidleman RODRIGUEZ[facility]THWOR[facility], PA-C

## 2018-03-28 NOTE — Patient Instructions (Signed)
[name] , Thank you for taking time to come for your Medicare Wellness Visit. I appreciate your ongoing commitment to your health goals. Please review the following plan we discussed and let me know if I can assist you in the future.   Screening recommendations/referrals: Colonoscopy: up to date  Recommended yearly ophthalmology/optometry visit for glaucoma screening and checkup Recommended yearly dental visit for hygiene and checkup  Vaccinations: Influenza vaccine: administered today Pneumococcal vaccine: 01/27/2009 Tdap vaccine: 12/27/2017 Shingles vaccine: decline    Advanced directives: Please bring a copy of your POA (Power of Attorney) and/or Living Will to your next appointment.    Conditions/risks identified: Obesity: patient did not want to make any goals.  Next appointment: 07/03/2018 at 11:00  Preventive Care 40-64 Years, Male Preventive care refers to lifestyle choices and visits with your health care provider that can promote health and wellness. What does preventive care include?  A yearly physical exam. This is also called an annual well check.  Dental exams once or twice a year.  Routine eye exams. Ask your health care provider how often you should have your eyes checked.  Personal lifestyle choices, including:  Daily care of your teeth and gums.  Regular physical activity.  Eating a healthy diet.  Avoiding tobacco and drug use.  Limiting alcohol use.  Practicing safe sex.  Taking low-dose aspirin every day starting at age 19. What happens during an annual well check? The services and screenings done by your health care provider during your annual well check will depend on your age, overall health, lifestyle risk factors, and family history of disease. Counseling  Your health care provider may ask you questions about your:  Alcohol use.  Tobacco use.  Drug use.  Emotional well-being.  Home and relationship well-being.  Sexual  activity.  Eating habits.  Work and work Astronomer. Screening  You may have the following tests or measurements:  Height, weight, and BMI.  Blood pressure.  Lipid and cholesterol levels. These may be checked every 5 years, or more frequently if you are over 71 years old.  Skin check.  Lung cancer screening. You may have this screening every year starting at age 53 if you have a 30-pack-year history of smoking and currently smoke or have quit within the past 15 years.  Fecal occult blood test (FOBT) of the stool. You may have this test every year starting at age 19.  Flexible sigmoidoscopy or colonoscopy. You may have a sigmoidoscopy every 5 years or a colonoscopy every 10 years starting at age 29.  Prostate cancer screening. Recommendations will vary depending on your family history and other risks.  Hepatitis C blood test.  Hepatitis B blood test.  Sexually transmitted disease (STD) testing.  Diabetes screening. This is done by checking your blood sugar (glucose) after you have not eaten for a while (fasting). You may have this done every 1-3 years. Discuss your test results, treatment options, and if necessary, the need for more tests with your health care provider. Vaccines  Your health care provider may recommend certain vaccines, such as:  Influenza vaccine. This is recommended every year.  Tetanus, diphtheria, and acellular pertussis (Tdap, Td) vaccine. You may need a Td booster every 10 years.  Zoster vaccine. You may need this after age 6.  Pneumococcal 13-valent conjugate (PCV13) vaccine. You may need this if you have certain conditions and have not been vaccinated.  Pneumococcal polysaccharide (PPSV23) vaccine. You may need one or two doses if you smoke  cigarettes or if you have certain conditions. Talk to your health care provider about which screenings and vaccines you need and how often you need them. This information is not intended to replace advice  given to you by your health care provider. Make sure you discuss any questions you have with your health care provider. Document Released: 07/02/2015 Document Revised: 02/23/2016 Document Reviewed: 04/06/2015 Elsevier Interactive Patient Education  2017 [employer].  Fall Prevention in the Home Falls can cause injuries. They can happen to people of all ages. There are many things you can do to make your home safe and to help prevent falls. What can I do on the outside of my home?  Regularly fix the edges of walkways and driveways and fix any cracks.  Remove anything that might make you trip as you walk through a door, such as a raised step or threshold.  Trim any bushes or trees on the path to your home.  Use bright outdoor lighting.  Clear any walking paths of anything that might make someone trip, such as rocks or tools.  Regularly check to see if handrails are loose or broken. Make sure that both sides of any steps have handrails.  Any raised decks and porches should have guardrails on the edges.  Have any leaves, snow, or ice cleared regularly.  Use sand or salt on walking paths during winter.  Clean up any spills in your garage right away. This includes oil or grease spills. What can I do in the bathroom?  Use night lights.  Install grab bars by the toilet and in the tub and shower. Do not use towel bars as grab bars.  Use non-skid mats or decals in the tub or shower.  If you need to sit down in the shower, use a plastic, non-slip stool.  Keep the floor dry. Clean up any water that spills on the floor as soon as it happens.  Remove soap buildup in the tub or shower regularly.  Attach bath mats securely with double-sided non-slip rug tape.  Do not have throw rugs and other things on the floor that can make you trip. What can I do in the bedroom?  Use night lights.  Make sure that you have a light by your bed that is easy to reach.  Do not use any sheets or  blankets that are too big for your bed. They should not hang down onto the floor.  Have a firm chair that has side arms. You can use this for support while you get dressed.  Do not have throw rugs and other things on the floor that can make you trip. What can I do in the kitchen?  Clean up any spills right away.  Avoid walking on wet floors.  Keep items that you use a lot in easy-to-reach places.  If you need to reach something above you, use a strong step stool that has a grab bar.  Keep electrical cords out of the way.  Do not use floor polish or wax that makes floors slippery. If you must use wax, use non-skid floor wax.  Do not have throw rugs and other things on the floor that can make you trip. What can I do with my stairs?  Do not leave any items on the stairs.  Make sure that there are handrails on both sides of the stairs and use them. Fix handrails that are broken or loose. Make sure that handrails are as long as the stairways.  Check any carpeting to make sure that it is firmly attached to the stairs. Fix any carpet that is loose or worn.  Avoid having throw rugs at the top or bottom of the stairs. If you do have throw rugs, attach them to the floor with carpet tape.  Make sure that you have a light switch at the top of the stairs and the bottom of the stairs. If you do not have them, ask someone to add them for you. What else can I do to help prevent falls?  Wear shoes that:  Do not have high heels.  Have rubber bottoms.  Are comfortable and fit you well.  Are closed at the toe. Do not wear sandals.  If you use a stepladder:  Make sure that it is fully opened. Do not climb a closed stepladder.  Make sure that both sides of the stepladder are locked into place.  Ask someone to hold it for you, if possible.  Clearly mark and make sure that you can see:  Any grab bars or handrails.  First and last steps.  Where the edge of each step is.  Use tools  that help you move around (mobility aids) if they are needed. These include:  Canes.  Walkers.  Scooters.  Crutches.  Turn on the lights when you go into a dark area. Replace any light bulbs as soon as they burn out.  Set up your furniture so you have a clear path. Avoid moving your furniture around.  If any of your floors are uneven, fix them.  If there are any pets around you, be aware of where they are.  Review your medicines with your doctor. Some medicines can make you feel dizzy. This can increase your chance of falling. Ask your doctor what other things that you can do to help prevent falls. This information is not intended to replace advice given to you by your health care provider. Make sure you discuss any questions you have with your health care provider. Document Released: 04/01/2009 Document Revised: 11/11/2015 Document Reviewed: 07/10/2014 Elsevier Interactive Patient Education  2017 [employer].

## 2018-03-28 NOTE — Progress Notes (Signed)
Subjective:   Chad Moss is a 62 y.o. male who presents for Medicare Annual/Subsequent preventive examination.  Review of Systems:  N/A Cardiac Risk Factors include: advanced age (>28men, >69 women);male gender;hypertension;sedentary lifestyle;obesity (BMI >30kg/m2)     Objective:    Vitals: BP (!) 146/82 (BP Location: Left Arm)   Pulse 86   Temp 97.7 F (36.5 C)   Ht 5' 9.5" (1.765 m)   Wt 224 lb 6.4 oz (101.8 kg)   BMI 32.66 kg/m   Body mass index is 32.66 kg/m.  Advanced Directives 03/28/2018 07/12/2017 05/29/2017 03/13/2017 06/28/2016 05/17/2016 04/01/2016  Does Patient Have a Medical Advance Directive? Yes No No Yes Yes No Yes  Type of Diplomatic Services operational officer - - Healthcare Power of [employer] Power of Attorney - Living will  Does patient want to make changes to medical advance directive? No - Patient declined - - No - Patient declined - - No - Patient declined  Copy of Healthcare Power of Attorney in Chart? No - copy requested - - No - copy requested No - copy requested - No - copy requested  Would patient like information on creating a medical advance directive? - - No - Patient declined - No - Patient declined No - Patient declined -    Tobacco Social History   Tobacco Use  Smoking Status Current Every Day Smoker  . Packs/day: 1.00  . Years: 47.00  . Pack years: 47.00  . Types: Cigarettes  Smokeless Tobacco Never Used  Tobacco Comment   Patient given smoking cessation information     Ready to quit: No Counseling given: Yes Comment: Patient given smoking cessation information   Clinical Intake:  Pre-visit preparation completed: Yes  Pain : No/denies pain     Nutritional Status: BMI > 30  Obese Nutritional Risks: None Diabetes: No  How often do you need to have someone help you when you read instructions, pamphlets, or other written materials from your doctor or pharmacy?: 1 - Never What is the last grade level you  completed in school?: 12th grade  Interpreter Needed?: No  Information entered by :: NAllen LPN  Past Medical History:  Diagnosis Date  . Allergic rhinitis   . Anxiety   . Atelectasis   . CAD (coronary artery disease), native coronary artery    9/18 PCI/DES x1 to mLCx, mild diffuse nonobstructive disease, EF 55% on Lv gram  . Chronic bronchitis (HCC)   . COPD (chronic obstructive pulmonary disease) (HCC)   . Crohn's disease (HCC)   . Depression   . DVT (deep venous thrombosis) (HCC)    RLE X 2  . Dysphagia   . Dyspnea   . Enlarged prostate   . GERD (gastroesophageal reflux disease)   . Glaucoma, both eyes   . Heart murmur   . History of stomach ulcers 1980s  . Hyperlipidemia   . Hypertension    "off RX for years now cause of coughing w/Lisinopril" (03/13/2017)  . IBS (irritable bowel syndrome)   . Paranoid schizophrenia (HCC)   . Peripheral vascular disease (HCC)   . Pneumonia 1990s  . Pre-diabetes    "one time" (03/13/2017)  . Stroke [facility]) 04/2016   "eye stroke; left eye" (03/13/2017)  . Syncopal episodes    Past Surgical History:  Procedure Laterality Date  . ABDOMINAL HERNIA REPAIR  1990s  . COLECTOMY  1985; ?1989; 1996   "part of my ileum removed"  . CORONARY ANGIOPLASTY WITH STENT PLACEMENT  03/13/2017  . CORONARY STENT INTERVENTION N/A 03/13/2017   Procedure: CORONARY STENT INTERVENTION;  Surgeon: Corky Crafts, MD;  Location[facility] INVASIVE CV LAB;  Service: Cardiovascular;  Laterality: N/A;  . EYE SURGERY Right    "laser; for glaucoma" (03/13/2017)  . FASCIOTOMY Right   . HERNIA REPAIR    . LAPAROSCOPIC CHOLECYSTECTOMY    . LEFT HEART CATH AND CORONARY ANGIOGRAPHY N/A 03/13/2017   Procedure: LEFT HEART CATH AND CORONARY ANGIOGRAPHY;  Surgeon: Corky Crafts, MD;  Location: [facility] INVASIVE CV LAB;  Service: Cardiovascular;  Laterality: N/A;  . PENILE PROSTHESIS IMPLANT  01/2011   Hattie Perch 02/01/2011  . PERIPHERAL VASCULAR CATHETERIZATION Right 1999   "had  blood clots in my leg; went in to open them up; dye went into leg; ended up having a fasiotomy"  . TONSILLECTOMY     Family History  Problem Relation Age of Onset  . Hypertension Mother   . Heart disease Mother   . Hypertension Father   . Heart disease Father   . Lung cancer Brother        2006   Social History   Socioeconomic History  . Marital status: Single    Spouse name: Not on file  . Number of children: Not on file  . Years of education: Not on file  . Highest education level: Not on file  Occupational History  . Occupation: disabled  Social Needs  . Financial resource strain: Somewhat hard  . Food insecurity:    Worry: Sometimes true    Inability: Sometimes true  . Transportation needs:    Medical: No    Non-medical: No  Tobacco Use  . Smoking status: Current Every Day Smoker    Packs/day: 1.00    Years: 47.00    Pack years: 47.00    Types: Cigarettes  . Smokeless tobacco: Never Used  . Tobacco comment: Patient given smoking cessation information  Substance and Sexual Activity  . Alcohol use: No    Alcohol/week: 0.0 standard drinks  . Drug use: No  . Sexual activity: Yes  Lifestyle  . Physical activity:    Days per week: 0 days    Minutes per session: 0 min  . Stress: Very much  Relationships  . Social connections:    Talks on phone: Not on file    Gets together: Not on file    Attends religious service: Not on file    Active member of club or organization: Not on file    Attends meetings of clubs or organizations: Not on file    Relationship status: Not on file  Other Topics Concern  . Not on file  Social History Narrative  . Not on file    Outpatient Encounter Medications as of 03/28/2018  Medication Sig  . albuterol (PROVENTIL) (2.5 MG/3ML) 0.083% nebulizer solution Take 3 mLs (2.5 mg total) by nebulization every 6 (six) hours as needed for wheezing or shortness of breath.  . alfuzosin (UROXATRAL) 10 MG 24 hr tablet Take 10 mg by mouth at  bedtime.   Marland Kitchen amLODipine (NORVASC) 5 MG tablet Take 1 tablet (5 mg total) by mouth daily.  Marland Kitchen aspirin EC 81 MG tablet Take 81 mg by mouth daily.  . bimatoprost (LUMIGAN) 0.01 % SOLN Place 1 drop into the left eye at bedtime.  . brimonidine (ALPHAGAN) 0.15 % ophthalmic solution Place 1 drop into the left eye 2 (two) times daily.   . cholestyramine (QUESTRAN) 4 G packet Take 1 packet by  mouth daily.   . clonazePAM (KLONOPIN) 1 MG tablet Take 1 mg by mouth at bedtime. Anxiety  . clopidogrel (PLAVIX) 75 MG tablet Take 1 tablet (75 mg total) by mouth daily with breakfast.  . dicyclomine (BENTYL) 10 MG capsule Take 10 mg by mouth 4 (four) times daily -  before meals and at bedtime.   . diphenhydrAMINE (BENADRYL) 25 MG tablet Take 50 mg by mouth at bedtime.   . finasteride (PROSCAR) 5 MG tablet Take 5 mg by mouth daily.  Marland Kitchen loxapine (LOXITANE) 10 MG capsule Take 10 mg by mouth 2 (two) times daily. 1 tab in AM and 2 tabs in PM  . mercaptopurine (PURINETHOL) 50 MG tablet Take 75 mg by mouth daily. 1 tablet and a half tablet Give on an empty stomach 1 hour before or 2 hours after meals. Caution: Chemotherapy.  . mesalamine (PENTASA) 250 MG CR capsule Take 1,000 mg by mouth 4 (four) times daily.   . methocarbamol (ROBAXIN) 500 MG tablet Take 1 tablet (500 mg total) by mouth 2 (two) times daily.  . mirabegron ER (MYRBETRIQ) 50 MG TB24 tablet Take 50 mg by mouth daily.  . mirtazapine (REMERON) 30 MG tablet Take 30 mg by mouth at bedtime.   . nitroGLYCERIN (NITROSTAT) 0.4 MG SL tablet Place 1 tablet (0.4 mg total) under the tongue every 5 (five) minutes x 3 doses as needed for chest pain.  . pantoprazole (PROTONIX) 40 MG tablet TAKE 1 TABLET(40 MG) BY MOUTH TWICE DAILY  . potassium chloride (K-DUR) 10 MEQ tablet Take 1 tablet (10 mEq total) by mouth 2 (two) times daily.  Marland Kitchen PROAIR HFA 108 (90 Base) MCG/ACT inhaler INHALE 2 PUFFS BY MOUTH INTO THE LUNGS EVERY 6 HOURS AS NEEDED FOR WHEEZING OR SHORTNESS OF BREATH    . SPIRIVA HANDIHALER 18 MCG inhalation capsule INHALE THE CONTENT OF 1 CAPSULE VIA HANDIHALER DAILY  . timolol (TIMOPTIC) 0.5 % ophthalmic solution Place 1 drop into the left eye 2 (two) times daily.  . traZODone (DESYREL) 50 MG tablet Take 150 mg by mouth at bedtime.  Marland Kitchen azithromycin (ZITHROMAX) 250 MG tablet Take 2 tablets (500mg ) on day 1, then take 1 tablet (250 mg)  on days 2-5 (Patient not taking: Reported on 03/28/2018)  . doxycycline (VIBRA-TABS) 100 MG tablet Take 1 tablet (100 mg total) by mouth 2 (two) times daily. (Patient not taking: Reported on 03/28/2018)  . isosorbide mononitrate (IMDUR) 30 MG 24 hr tablet Take 1 tablet (30 mg total) by mouth daily.  . predniSONE (DELTASONE) 10 MG tablet Take 40mg x1day,30mg x1day,20mg x1day,10mg x1day,5mg x1day,then stop (Patient not taking: Reported on 03/28/2018)  . predniSONE (DELTASONE) 10 MG tablet Take 4 tabs for 2 days, then 3 tabs for 2 days, then 2 tabs for 2 days, then 1 tab for 2 days, then stop (Patient not taking: Reported on 03/28/2018)  . rosuvastatin (CRESTOR) 20 MG tablet Take 1 tablet (20 mg total) by mouth daily.  Marland Kitchen SPIRIVA HANDIHALER 18 MCG inhalation capsule INHALE THE CONTENT OF 1 CAPSULE VIA HANDIHALER DAILY (Patient not taking: Reported on 03/28/2018)   No facility-administered encounter medications on file as of 03/28/2018.     Activities of Daily Living In your present state of health, do you have any difficulty performing the following activities: 03/28/2018  Hearing? N  Vision? Y  Difficulty concentrating or making decisions? Y  Comment Hears voices in his head that tells im to hurt people.  Walking or climbing stairs? N  Dressing or bathing? Y  Comment  Sees snakes in the water so care giver gives baths.  Doing errands, shopping? Y  Comment Can not be alone due to hearing voices that wants him to do bad things.  Preparing Food and eating ? Y  Using the Toilet? N  In the past six months, have you accidently leaked  urine? N  Do you have problems with loss of bowel control? N  Managing your Medications? Y  Managing your Finances? Y  Housekeeping or managing your Housekeeping? Y  Some recent data might be hidden    Patient Care Team: Dorothyann Peng, MD as PCP - General (Internal Medicine) Corky Crafts, MD as PCP - Cardiology (Cardiology)   Assessment:   This is a routine wellness examination for Aarish.  Exercise Activities and Dietary recommendations Current Exercise Habits: The patient does not participate in regular exercise at present, Exercise limited by: psychological condition(s)  Goals   None     Fall Risk Fall Risk  03/28/2018 04/17/2017  Falls in the past year? No No  Risk for fall due to : Medication side effect Impaired vision   Is the patient's home free of loose throw rugs in walkways, pet beds, electrical cords, etc?   yes      Grab bars in the bathroom? yes      Handrails on the stairs?   yes      Adequate lighting?   yes  Timed Get Up and Go Performed: N/A  Depression Screen PHQ 2/9 Scores 03/28/2018 04/17/2017  PHQ - 2 Score 6 1  PHQ- 9 Score 18 -    Cognitive Function     6CIT Screen 03/28/2018  What Year? 0 points  What month? 0 points  What time? 0 points  Count back from 20 2 points  Months in reverse 0 points  Repeat phrase 0 points  Total Score 2    Immunization History  Administered Date(s) Administered  . Influenza Split 04/06/2011, 03/12/2012, 03/31/2015  . Influenza Whole 05/10/2009  . Influenza,inj,Quad PF,6+ Mos 03/04/2013, 04/02/2017, 03/28/2018  . Influenza-Unspecified 04/19/2014  . Pneumococcal Conjugate-13 01/27/2009  . Pneumococcal Polysaccharide-23 06/19/2008  . Tdap 12/27/2017    Qualifies for Shingles Vaccine? decline  Screening Tests Health Maintenance  Topic Date Due  . Hepatitis C Screening  11-17-1955  . HIV Screening  10/[birth year]/1968  . COLONOSCOPY  03/25/2024  . TETANUS/TDAP  12/28/2027  . INFLUENZA VACCINE   Completed   Cancer Screenings: Lung: Low Dose CT Chest recommended if Age 43-80 years, 30 pack-year currently smoking OR have quit w/in 15years. Patient does qualify. Colorectal: up to date  Additional Screenings:  Hepatitis C Screening:due      Plan:    Patient does not want to make any goals for the year. Discussed quitting smoking. States he does not want to at this time.  I have personally reviewed and noted the following in the patient's chart:   . Medical and social history . Use of alcohol, tobacco or illicit drugs  . Current medications and supplements . Functional ability and status . Nutritional status . Physical activity . Advanced directives . List of other physicians . Hospitalizations, surgeries, and ER visits in previous 12 months . Vitals . Screenings to include cognitive, depression, and falls . Referrals and appointments  In addition, I have reviewed and discussed with patient certain preventive protocols, quality metrics, and best practice recommendations. A written personalized care plan for preventive services as well as general preventive health recommendations were provided to patient.  Barb Merino, LPN  04/15/2535

## 2018-03-28 NOTE — Patient Instructions (Addendum)
Try to cut out the soda by one a week and substitute it with carbonated water.  Try to avoid sugar, you are going to end up with diabetes if you continue eating that much sugar.

## 2018-03-29 LAB — LIPID PANEL
Chol/HDL Ratio: 2.9 ratio (ref 0.0–5.0)
Cholesterol, Total: 106 mg/dL (ref 100–199)
HDL: 36 mg/dL — ABNORMAL LOW (ref >39)
LDL Calculated: 44 mg/dL (ref 0–99)
Triglycerides: 132 mg/dL (ref 0–149)
VLDL Cholesterol Cal: 26 mg/dL (ref 5–40)

## 2018-03-29 LAB — CMP14 + ANION GAP
ALT: 30 IU/L (ref 0–44)
AST: 32 IU/L (ref 0–40)
Albumin/Globulin Ratio: 1.4 (ref 1.2–2.2)
Albumin: 4 g/dL (ref 3.6–4.8)
Alkaline Phosphatase: 80 IU/L (ref 39–117)
Anion Gap: 15 mmol/L (ref 10.0–18.0)
BUN/Creatinine Ratio: 7 — ABNORMAL LOW (ref 10–24)
BUN: 7 mg/dL — ABNORMAL LOW (ref 8–27)
Bilirubin Total: 0.2 mg/dL (ref 0.0–1.2)
CO2: 23 mmol/L (ref 20–29)
Calcium: 9 mg/dL (ref 8.6–10.2)
Chloride: 102 mmol/L (ref 96–106)
Creatinine, Ser: 1.01 mg/dL (ref 0.76–1.27)
GFR calc Af Amer: 90 mL/min/{1.73_m2} (ref >59)
GFR calc non Af Amer: 78 mL/min/{1.73_m2} (ref >59)
Globulin, Total: 2.9 g/dL (ref 1.5–4.5)
Glucose: 89 mg/dL (ref 65–99)
Potassium: 3.8 mmol/L (ref 3.5–5.2)
Sodium: 140 mmol/L (ref 134–144)
Total Protein: 6.9 g/dL (ref 6.0–8.5)

## 2018-03-29 LAB — HEMOGLOBIN A1C
Est. average glucose Bld gHb Est-mCnc: 123 mg/dL
Hgb A1c MFr Bld: 5.9 % — ABNORMAL HIGH (ref 4.8–5.6)

## 2018-03-29 LAB — CBC
Hematocrit: 37.5 % (ref 37.5–51.0)
Hemoglobin: 12.8 g/dL — ABNORMAL LOW (ref 13.0–17.7)
MCH: 30.9 pg (ref 26.6–33.0)
MCHC: 34.1 g/dL (ref 31.5–35.7)
MCV: 91 fL (ref 79–97)
Platelets: 195 10*3/uL (ref 150–450)
RBC: 4.14 x10E6/uL (ref 4.14–5.80)
RDW: 15.1 % (ref 12.3–15.4)
WBC: 7.1 10*3/uL (ref 3.4–10.8)

## 2018-05-10 ENCOUNTER — Other Ambulatory Visit: Payer: Self-pay

## 2018-05-10 ENCOUNTER — Other Ambulatory Visit: Payer: Medicare Other | Admitting: *Deleted

## 2018-05-10 DIAGNOSIS — K5000 Crohn's disease of small intestine without complications: Secondary | ICD-10-CM

## 2018-05-10 LAB — COMPREHENSIVE METABOLIC PANEL
ALT: 25 IU/L (ref 0–44)
AST: 24 IU/L (ref 0–40)
Albumin/Globulin Ratio: 1.6 (ref 1.2–2.2)
Albumin: 3.9 g/dL (ref 3.6–4.8)
Alkaline Phosphatase: 71 IU/L (ref 39–117)
BUN/Creatinine Ratio: 9 — ABNORMAL LOW (ref 10–24)
BUN: 8 mg/dL (ref 8–27)
Bilirubin Total: 0.3 mg/dL (ref 0.0–1.2)
CO2: 21 mmol/L (ref 20–29)
Calcium: 8.7 mg/dL (ref 8.6–10.2)
Chloride: 105 mmol/L (ref 96–106)
Creatinine, Ser: 0.92 mg/dL (ref 0.76–1.27)
GFR calc Af Amer: 101 mL/min/{1.73_m2} (ref >59)
GFR calc non Af Amer: 88 mL/min/{1.73_m2} (ref >59)
Globulin, Total: 2.5 g/dL (ref 1.5–4.5)
Glucose: 84 mg/dL (ref 65–99)
Potassium: 3.9 mmol/L (ref 3.5–5.2)
Sodium: 141 mmol/L (ref 134–144)
Total Protein: 6.4 g/dL (ref 6.0–8.5)

## 2018-05-10 LAB — CBC
Hematocrit: 34.4 % — ABNORMAL LOW (ref 37.5–51.0)
Hemoglobin: 12.3 g/dL — ABNORMAL LOW (ref 13.0–17.7)
MCH: 31.9 pg (ref 26.6–33.0)
MCHC: 35.8 g/dL — ABNORMAL HIGH (ref 31.5–35.7)
MCV: 89 fL (ref 79–97)
Platelets: 206 10*3/uL (ref 150–450)
RBC: 3.86 x10E6/uL — ABNORMAL LOW (ref 4.14–5.80)
RDW: 14.8 % (ref 12.3–15.4)
WBC: 6 10*3/uL (ref 3.4–10.8)

## 2018-05-10 LAB — C-REACTIVE PROTEIN: CRP: 2 mg/L (ref 0–10)

## 2018-05-23 ENCOUNTER — Telehealth: Payer: Self-pay | Admitting: Internal Medicine

## 2018-05-23 ENCOUNTER — Ambulatory Visit (INDEPENDENT_AMBULATORY_CARE_PROVIDER_SITE_OTHER): Payer: Medicare Other | Admitting: Interventional Cardiology

## 2018-05-23 ENCOUNTER — Encounter: Payer: Self-pay | Admitting: Interventional Cardiology

## 2018-05-23 VITALS — BP 152/82 | HR 95 | Ht 69.5 in | Wt 230.4 lb

## 2018-05-23 DIAGNOSIS — M7989 Other specified soft tissue disorders: Secondary | ICD-10-CM | POA: Diagnosis not present

## 2018-05-23 DIAGNOSIS — I10 Essential (primary) hypertension: Secondary | ICD-10-CM

## 2018-05-23 DIAGNOSIS — E785 Hyperlipidemia, unspecified: Secondary | ICD-10-CM | POA: Diagnosis not present

## 2018-05-23 DIAGNOSIS — I25119 Atherosclerotic heart disease of native coronary artery with unspecified angina pectoris: Secondary | ICD-10-CM

## 2018-05-23 MED ORDER — FUROSEMIDE 40 MG PO TABS: 40.0000 mg | ORAL_TABLET | Freq: Every day | ORAL | 3 refills | Status: DC

## 2018-05-23 NOTE — Telephone Encounter (Signed)
Called and spoke with pt and his wife letting them know that we should get him in for a follow up OV due to last visit with MR being in May and last visit with NP was an acute visit. Pt expressed understanding. Scheduled OV for pt with TP tomorrow 12/6 at 11:45. I did sent pt a mychart message as asked with address and also appt info. Nothing further needed.

## 2018-05-23 NOTE — Progress Notes (Signed)
Cardiology Office Note   Date:  05/23/2018   ID:  Chad Moss, DOB [birth year], MRN [id number]  PCP:  Glendale Chard, MD    No chief complaint on file.  CAD  Wt Readings from Last 3 Encounters:  05/23/18 230 lb 6.4 oz (104.5 kg)  03/28/18 224 lb 6.4 oz (101.8 kg)  12/27/17 224 lb 3.2 oz (101.7 kg)       History of Present Illness: Chad Moss is a 62 y.o. male  With CAD, after CT showed coronary calcification.  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  Long h/o tobacco abuse.    Cath on 03/13/17 showed:   The left ventricular systolic function is normal.  LV end diastolic pressure is normal.  The left ventricular ejection fraction is 55-65% by visual estimate.  There is no aortic valve stenosis.  Mid Cx lesion, 75 %stenosed.  A STENT RESOLUTE ONYX [id number] drug eluting stent was successfully placed.  Post intervention, there is a 0% residual stenosis.  Unable to use right radial artery due to high bifurcation of radial and ulnar arteries.  He was advised to stop smoking, but has not been able to stop.  There is been increased lower extremity swelling since last visit.  He has gained 20 pounds as well.  He does admit to eating sweets more over the holidays.  He is not exercising very much.  Denies : Chest pain. Dizziness. Nitroglycerin use. Orthopnea. Palpitations. Paroxysmal nocturnal dyspnea. Shortness of breath. Syncope.     Past Medical History:  Diagnosis Date  . Allergic rhinitis   . Anxiety   . Atelectasis   . CAD (coronary artery disease), native coronary artery    9/18 PCI/DES x1 to mLCx, mild diffuse nonobstructive disease, EF 55% on Lv gram  . Chronic bronchitis ([place])   . COPD (chronic obstructive pulmonary disease) ([place])   . Crohn's disease ([place])   . Depression   . DVT (deep venous thrombosis) ([facility])    RLE X 2  . Dysphagia   . Dyspnea   . Enlarged prostate   . GERD (gastroesophageal reflux disease)   . Glaucoma, both eyes     . Heart murmur   . History of stomach ulcers 1980s  . Hyperlipidemia   . Hypertension    "off RX for years now cause of coughing w/Lisinopril" (03/13/2017)  . IBS (irritable bowel syndrome)   . Paranoid schizophrenia ([place])   . Peripheral vascular disease ([place])   . Pneumonia 1990s  . Pre-diabetes    "one time" (03/13/2017)  . Stroke [facility]) 04/2016   "eye stroke; left eye" (03/13/2017)  . Syncopal episodes     Past Surgical History:  Procedure Laterality Date  . ABDOMINAL HERNIA REPAIR  1990s  . COLECTOMY  1985; ?1989; 1996   "part of my ileum removed"  . CORONARY ANGIOPLASTY WITH STENT PLACEMENT  03/13/2017  . CORONARY STENT INTERVENTION N/A 03/13/2017   Procedure: CORONARY STENT INTERVENTION;  Surgeon: Jettie Booze, MD;  Location: [place] [facility];  Service: Cardiovascular;  Laterality: N/A;  . EYE SURGERY Right    "laser; for glaucoma" (03/13/2017)  . FASCIOTOMY Right   . HERNIA REPAIR    . LAPAROSCOPIC CHOLECYSTECTOMY    . LEFT HEART CATH AND CORONARY ANGIOGRAPHY N/A 03/13/2017   Procedure: LEFT HEART CATH AND CORONARY ANGIOGRAPHY;  Surgeon: Jettie Booze, MD;  Location: [place] [facility];  Service: Cardiovascular;  Laterality: N/A;  . PENILE PROSTHESIS IMPLANT  01/2011   Archie Endo 02/01/2011  . PERIPHERAL VASCULAR CATHETERIZATION Right 1999   "had blood clots in my leg; went in to open them up; dye went into leg; ended up having a fasiotomy"  . TONSILLECTOMY       Current Outpatient Medications  Medication Sig Dispense Refill  . albuterol (PROVENTIL) (2.5 MG/3ML) 0.083% nebulizer solution Take 3 mLs (2.5 mg total) by nebulization every 6 (six) hours as needed for wheezing or shortness of breath. 120 mL 12  . alfuzosin (UROXATRAL) 10 MG 24 hr tablet Take 10 mg by mouth at bedtime.     Marland Kitchen amLODipine (NORVASC) 5 MG tablet Take 1 tablet (5 mg total) by mouth daily. 90 tablet 3  . aspirin EC 81 MG tablet Take 81 mg by mouth daily.    . bimatoprost (LUMIGAN) 0.01 %  SOLN Place 1 drop into the left eye at bedtime.    . brimonidine (ALPHAGAN) 0.15 % ophthalmic solution Place 1 drop into the left eye 2 (two) times daily.     . cholestyramine (QUESTRAN) 4 G packet Take 1 packet by mouth daily.     . clonazePAM (KLONOPIN) 1 MG tablet Take 1 mg by mouth at bedtime. Anxiety    . clopidogrel (PLAVIX) 75 MG tablet Take 1 tablet (75 mg total) by mouth daily with breakfast. 90 tablet 3  . dicyclomine (BENTYL) 10 MG capsule Take 10 mg by mouth 4 (four) times daily -  before meals and at bedtime.     . diphenhydrAMINE (BENADRYL) 25 MG tablet Take 50 mg by mouth at bedtime.     . finasteride (PROSCAR) 5 MG tablet Take 5 mg by mouth daily.    Marland Kitchen loxapine (LOXITANE) 10 MG capsule Take 10 mg by mouth 2 (two) times daily. 1 tab in AM and 2 tabs in PM    . mercaptopurine (PURINETHOL) 50 MG tablet Take 75 mg by mouth daily. 1 tablet and a half tablet Give on an empty stomach 1 hour before or 2 hours after meals. Caution: Chemotherapy.    . mesalamine (PENTASA) 250 MG CR capsule Take 1,000 mg by mouth 4 (four) times daily.     . methocarbamol (ROBAXIN) 500 MG tablet Take 1 tablet (500 mg total) by mouth 2 (two) times daily. 20 tablet 0  . mirtazapine (REMERON) 30 MG tablet Take 30 mg by mouth at bedtime.     . nitroGLYCERIN (NITROSTAT) 0.4 MG SL tablet Place 1 tablet (0.4 mg total) under the tongue every 5 (five) minutes x 3 doses as needed for chest pain. 25 tablet 3  . pantoprazole (PROTONIX) 40 MG tablet TAKE 1 TABLET(40 MG) BY MOUTH TWICE DAILY 60 tablet 5  . potassium chloride (K-DUR) 10 MEQ tablet Take 1 tablet (10 mEq total) by mouth 2 (two) times daily. 180 tablet 3  . PROAIR HFA 108 (90 Base) MCG/ACT inhaler INHALE 2 PUFFS BY MOUTH INTO THE LUNGS EVERY 6 HOURS AS NEEDED FOR WHEEZING OR SHORTNESS OF BREATH 8.5 g 5  . SPIRIVA HANDIHALER 18 MCG inhalation capsule INHALE THE CONTENT OF 1 CAPSULE VIA HANDIHALER DAILY 30 capsule 0  . timolol (TIMOPTIC) 0.5 % ophthalmic solution  Place 1 drop into the left eye 2 (two) times daily.    . traZODone (DESYREL) 50 MG tablet Take 150 mg by mouth at bedtime.    . furosemide (LASIX) 40 MG tablet Take 1 tablet (40 mg total) by mouth daily. 90 tablet 3  . isosorbide mononitrate (IMDUR) 30 MG  24 hr tablet Take 1 tablet (30 mg total) by mouth daily. 90 tablet 3  . rosuvastatin (CRESTOR) 20 MG tablet Take 1 tablet (20 mg total) by mouth daily. 90 tablet 3   No current facility-administered medications for this visit.     Allergies:   Benztropine; Codeine; Meperidine; Cyclobenzaprine; Penicillins; Sulfamethoxazole-trimethoprim; and Sulfonamide derivatives    Social History:  The patient  reports that he has been smoking cigarettes. He has a 47.00 pack-year smoking history. He has never used smokeless tobacco. He reports that he does not drink alcohol or use drugs.   Family History:  The patient's family history includes Heart disease in his father and mother; Hypertension in his father and mother; Lung cancer in his brother.    ROS:  Please see the history of present illness.   Otherwise, review of systems are positive for leg swelling.   All other systems are reviewed and negative.    PHYSICAL EXAM: VS:  BP (!) 152/82   Pulse 95   Ht 5' 9.5" (1.765 m)   Wt 230 lb 6.4 oz (104.5 kg)   SpO2 96%   BMI 33.54 kg/m  , BMI Body mass index is 33.54 kg/m. GEN: Well nourished, well developed, in no acute distress  HEENT: normal  Neck: no JVD, carotid bruits, or masses Cardiac: RRR; no murmurs, rubs, or gallops,; 2+ bilateral lower extremity edema -pitting Respiratory:  clear to auscultation bilaterally, normal work of breathing GI: soft, nontender, nondistended, + BS MS: no deformity or atrophy  Skin: warm and dry, no rash Neuro:  Strength and sensation are intact Psych: euthymic mood, full affect   EKG:   The ekg ordered today demonstrates NSR, no ST changes   Recent Labs: 05/10/2018: ALT 25; BUN 8; Creatinine, Ser  0.92; Hemoglobin 12.3; Platelets 206; Potassium 3.9; Sodium 141   Lipid Panel    Component Value Date/Time   CHOL 106 03/28/2018 1152   TRIG 132 03/28/2018 1152   HDL 36 (L) 03/28/2018 1152   CHOLHDL 2.9 03/28/2018 1152   LDLCALC 44 03/28/2018 1152     Other studies Reviewed: Additional studies/ records that were reviewed today with results demonstrating: Labs reviewed LDL 44 in October 2019.   ASSESSMENT AND PLAN:  1. CAD: No angina post circumflex stent.  Continue aggressive secondary prevention.  Some evidence of diastolic heart failure with lower extremity edema and some shortness of breath.  Weight gain as well.  Start Lasix 40 mg daily.  He is already on potassium.  Will check labs in a week. 2. Hyperlipidemia: Continue current lipid-lowering therapy. 3. CVA: Continue clopidogrel for antiplatelet therapy. 4. Tobacco abuse: I again recommended to him that he stop smoking. 5. HTN: High reading today.  Hopefully, this will come down with diuresis.   Current medicines are reviewed at length with the patient today.  The patient concerns regarding his medicines were addressed.  The following changes have been made:  Start Lasix  Labs/ tests ordered today include: BMet in 1 week  Orders Placed This Encounter  Procedures  . Basic metabolic panel  . EKG 12-Lead    Recommend 150 minutes/week of aerobic exercise Low fat, low carb, high fiber diet recommended  Disposition:   FU in 6 months   Signed, Larae Grooms, MD  05/23/2018 2:59 PM    [address], [place], [place]  [address] Phone: [phone]; Fax: [phone]

## 2018-05-23 NOTE — Patient Instructions (Signed)
Medication Instructions:  Your physician has recommended you make the following change in your medication:   START: furosemide (lasix) 40 mg tablet: Take 1 tablet by mouth once a day   If you need a refill on your cardiac medications before your next appointment, please call your pharmacy.   Lab work: Your physician recommends that you return for lab work in: 1 week for BMET   If you have labs (blood work) drawn today and your tests are completely normal, you will receive your results only by: Marland Kitchen MyChart Message (if you have MyChart) OR . A paper copy in the mail If you have any lab test that is abnormal or we need to change your treatment, we will call you to review the results.  Testing/Procedures: None ordered  Follow-Up: At [facility], you and your health needs are our priority.  As part of our continuing mission to provide you with exceptional heart care, we have created designated Provider Care Teams.  These Care Teams include your primary Cardiologist (physician) and Advanced Practice Providers (APPs -  Physician Assistants and Nurse Practitioners) who all work together to provide you with the care you need, when you need it. . You will need a follow up appointment in 6 months.  Please call our office 2 months in advance to schedule this appointment.  You may see Everette Rank, MD or one of the following Advanced Practice Providers on your designated Care Team:   . Robbie Lis, PA-C . Dayna Dunn, PA-C . Jacolyn Reedy, PA-C  Any Other Special Instructions Will Be Listed Below (If Applicable).

## 2018-05-24 ENCOUNTER — Ambulatory Visit (INDEPENDENT_AMBULATORY_CARE_PROVIDER_SITE_OTHER): Payer: Medicare Other | Admitting: Adult Health

## 2018-05-24 ENCOUNTER — Encounter: Payer: Self-pay | Admitting: Adult Health

## 2018-05-24 DIAGNOSIS — J449 Chronic obstructive pulmonary disease, unspecified: Secondary | ICD-10-CM

## 2018-05-24 DIAGNOSIS — R350 Frequency of micturition: Secondary | ICD-10-CM | POA: Diagnosis not present

## 2018-05-24 NOTE — Patient Instructions (Addendum)
Continue on Spiriva 1 puff daily Mucinex DM Twice daily  As needed  Cough /congestion  Work on not smoking-this is most important .  Follow-up with Dr. Marchelle Gearing in 6 months and as needed

## 2018-05-24 NOTE — Assessment & Plan Note (Signed)
Compensated on present regimen  Smoking cessation discussed   Plan  Patient Instructions  Continue on Spiriva 1 puff daily Mucinex DM Twice daily  As needed  Cough /congestion  Work on not smoking-this is most important .  Follow-up with Dr. Marchelle Gearing in 6 months and as needed

## 2018-05-24 NOTE — Progress Notes (Signed)
@Patient  ID: Chad Moss, male    DOB: September 27, 1955, 62 y.o.   MRN: [id number]  Chief Complaint  Patient presents with  . Follow-up    COPD     Referring provider: Dorothyann Peng, MD  HPI: 62 year old male active smoker followed for COPD Medical history significant for paranoid schizophrenia, Crohns dz   TEST/EVENTS :  PFT January 2016 FEV1 84%, ratio 78, FVC 84% no significant bronchodilator response DLCO 56%. Low-dose CT screening April 2019 benign appearance  05/24/2018 Follow up : COPD  Patient presents for a 24-month follow-up.  Says overall breathing is doing the okay. Does have intermittent cough and wheezing -considers mild. Needs refills on all his breathing meds.  Patient is with caregiver today.  Was seen 3 months ago with a COPD flare.  Was treated with Z-Pak and prednisone.  Patient says symptoms resolved and back to his baseline.  He continues to smoke 1 pack of cigarettes daily.  Smoking cessation was encouraged. Says that he tries to walk at home but is not as active as he should be. Had low-dose CT screening in April 2019 with benign appearance.  Patient is planning on his repeat in April 2020.   Allergies  Allergen Reactions  . Benztropine Anaphylaxis  . Codeine Anaphylaxis  . Meperidine Swelling  . Cyclobenzaprine Other (See Comments)    Pt. Does not remember  . Penicillins Rash    Has patient had a PCN reaction causing immediate rash, facial/tongue/throat swelling, SOB or lightheadedness with hypotension: Yes Has patient had a PCN reaction causing severe rash involving mucus membranes or skin necrosis: No Has patient had a PCN reaction that required hospitalization No Has patient had a PCN reaction occurring within the last 10 years: No If all of the above answers are "NO", then may proceed with Cephalosporin use.   . Sulfamethoxazole-Trimethoprim     REACTION: hives  . Sulfonamide Derivatives     REACTION: hives    Immunization History    Administered Date(s) Administered  . Influenza Split 04/06/2011, 03/12/2012, 03/31/2015  . Influenza Whole 05/10/2009  . Influenza,inj,Quad PF,6+ Mos 03/04/2013, 04/02/2017, 03/28/2018  . Influenza-Unspecified 04/19/2014  . Pneumococcal Conjugate-13 01/27/2009  . Pneumococcal Polysaccharide-23 06/19/2008  . Tdap 12/27/2017    Past Medical History:  Diagnosis Date  . Allergic rhinitis   . Anxiety   . Atelectasis   . CAD (coronary artery disease), native coronary artery    9/18 PCI/DES x1 to mLCx, mild diffuse nonobstructive disease, EF 55% on Lv gram  . Chronic bronchitis ([facility])   . COPD (chronic obstructive pulmonary disease) ([facility])   . Crohn's disease ([facility])   . Depression   . DVT (deep venous thrombosis) ([facility])    RLE X 2  . Dysphagia   . Dyspnea   . Enlarged prostate   . GERD (gastroesophageal reflux disease)   . Glaucoma, both eyes   . Heart murmur   . History of stomach ulcers 1980s  . Hyperlipidemia   . Hypertension    "off RX for years now cause of coughing w/Lisinopril" (03/13/2017)  . IBS (irritable bowel syndrome)   . Paranoid schizophrenia ([facility])   . Peripheral vascular disease ([facility])   . Pneumonia 1990s  . Pre-diabetes    "one time" (03/13/2017)  . Stroke [facility]) 04/2016   "eye stroke; left eye" (03/13/2017)  . Syncopal episodes     Tobacco History: Social History   Tobacco Use  Smoking Status Current Every Day Smoker  . Packs/day: 1.00  .  Years: 47.00  . Pack years: 47.00  . Types: Cigarettes  Smokeless Tobacco Never Used  Tobacco Comment   Patient given smoking cessation information   Ready to quit: No Counseling given: Yes Comment: Patient given smoking cessation information   Outpatient Medications Prior to Visit  Medication Sig Dispense Refill  . albuterol (PROVENTIL) (2.5 MG/3ML) 0.083% nebulizer solution Take 3 mLs (2.5 mg total) by nebulization every 6 (six) hours as needed for wheezing or shortness of breath. 120 mL 12  . alfuzosin  (UROXATRAL) 10 MG 24 hr tablet Take 10 mg by mouth at bedtime.     Marland Kitchen amLODipine (NORVASC) 5 MG tablet Take 1 tablet (5 mg total) by mouth daily. 90 tablet 3  . aspirin EC 81 MG tablet Take 81 mg by mouth daily.    . bimatoprost (LUMIGAN) 0.01 % SOLN Place 1 drop into the left eye at bedtime.    . brimonidine (ALPHAGAN) 0.15 % ophthalmic solution Place 1 drop into the left eye 2 (two) times daily.     . cholestyramine (QUESTRAN) 4 G packet Take 1 packet by mouth daily.     . clonazePAM (KLONOPIN) 1 MG tablet Take 1 mg by mouth at bedtime. Anxiety    . clopidogrel (PLAVIX) 75 MG tablet Take 1 tablet (75 mg total) by mouth daily with breakfast. 90 tablet 3  . dicyclomine (BENTYL) 10 MG capsule Take 10 mg by mouth 4 (four) times daily -  before meals and at bedtime.     . diphenhydrAMINE (BENADRYL) 25 MG tablet Take 50 mg by mouth at bedtime.     . fesoterodine (TOVIAZ) 4 MG TB24 tablet Take 4 mg by mouth daily.    . finasteride (PROSCAR) 5 MG tablet Take 5 mg by mouth daily.    . furosemide (LASIX) 40 MG tablet Take 1 tablet (40 mg total) by mouth daily. 90 tablet 3  . isosorbide mononitrate (IMDUR) 30 MG 24 hr tablet Take 1 tablet (30 mg total) by mouth daily. 90 tablet 3  . loxapine (LOXITANE) 10 MG capsule Take 10 mg by mouth 2 (two) times daily. 1 tab in AM and 2 tabs in PM    . mercaptopurine (PURINETHOL) 50 MG tablet Take 75 mg by mouth daily. 1 tablet and a half tablet Give on an empty stomach 1 hour before or 2 hours after meals. Caution: Chemotherapy.    . mesalamine (PENTASA) 250 MG CR capsule Take 1,000 mg by mouth 4 (four) times daily.     . methocarbamol (ROBAXIN) 500 MG tablet Take 1 tablet (500 mg total) by mouth 2 (two) times daily. 20 tablet 0  . mirtazapine (REMERON) 30 MG tablet Take 30 mg by mouth at bedtime.     . nitroGLYCERIN (NITROSTAT) 0.4 MG SL tablet Place 1 tablet (0.4 mg total) under the tongue every 5 (five) minutes x 3 doses as needed for chest pain. 25 tablet 3  .  pantoprazole (PROTONIX) 40 MG tablet TAKE 1 TABLET(40 MG) BY MOUTH TWICE DAILY 60 tablet 5  . potassium chloride (K-DUR) 10 MEQ tablet Take 1 tablet (10 mEq total) by mouth 2 (two) times daily. 180 tablet 3  . PROAIR HFA 108 (90 Base) MCG/ACT inhaler INHALE 2 PUFFS BY MOUTH INTO THE LUNGS EVERY 6 HOURS AS NEEDED FOR WHEEZING OR SHORTNESS OF BREATH 8.5 g 5  . SPIRIVA HANDIHALER 18 MCG inhalation capsule INHALE THE CONTENT OF 1 CAPSULE VIA HANDIHALER DAILY 30 capsule 0  . timolol (TIMOPTIC) 0.5 %  ophthalmic solution Place 1 drop into the left eye 2 (two) times daily.    . traZODone (DESYREL) 50 MG tablet Take 150 mg by mouth at bedtime.    . rosuvastatin (CRESTOR) 20 MG tablet Take 1 tablet (20 mg total) by mouth daily. 90 tablet 3   No facility-administered medications prior to visit.      Review of Systems  Constitutional:   No  weight loss, night sweats,  Fevers, chills, fatigue, or  lassitude.  HEENT:   No headaches,  Difficulty swallowing,  Tooth/dental problems, or  Sore throat,                No sneezing, itching, ear ache,  +nasal congestion, post nasal drip,   CV:  No chest pain,  Orthopnea, PND, swelling in lower extremities, anasarca, dizziness, palpitations, syncope.   GI  No heartburn, indigestion, abdominal pain, nausea, vomiting, diarrhea, change in bowel habits, loss of appetite, bloody stools.   Resp:   No chest wall deformity  Skin: no rash or lesions.  GU: no dysuria, change in color of urine, no urgency or frequency.  No flank pain, no hematuria   MS:  No joint pain or swelling.  No decreased range of motion.  No back pain.    Physical Exam  BP 126/64 (BP Location: Left Arm, Cuff Size: Normal)   Pulse 89   Ht 5\' 11"  (1.803 m)   Wt 228 lb (103.4 kg)   SpO2 99%   BMI 31.80 kg/m   GEN: A/Ox3; pleasant , NAD,  Obese    HEENT:  [place]/AT,  EACs-clear, TMs-wnl, NOSE-clear drainage , THROAT-clear, no lesions, no postnasal drip or exudate noted.   NECK:  Supple  w/ fair ROM; no JVD; normal carotid impulses w/o bruits; no thyromegaly or nodules palpated; no lymphadenopathy.    RESP  Clear  P & A; w/o, wheezes/ rales/ or rhonchi. no accessory muscle use, no dullness to percussion  CARD:  RRR, no m/r/g, no peripheral edema, pulses intact, no cyanosis or clubbing.  GI:   Soft & nt; nml bowel sounds; no organomegaly or masses detected.   Musco: Warm bil, no deformities or joint swelling noted.   Neuro: alert, no focal deficits noted.    Skin: Warm, no lesions or rashes    Lab Results:   BMET  BNP No results found for: BNP  ProBNP No results found for: PROBNP  Imaging: No results found.    PFT Results Latest Ref Rng & Units 07/09/2014  FVC-Pre L 3.71  FVC-Predicted Pre % 87  FVC-Post L 3.57  FVC-Predicted Post % 84  Pre FEV1/FVC % % 74  Post FEV1/FCV % % 78  FEV1-Pre L 2.74  FEV1-Predicted Pre % 83  FEV1-Post L 2.78  DLCO UNC% % 56  DLCO COR %Predicted % 90    No results found for: NITRICOXIDE      Assessment & Plan:   COLD (chronic obstructive lung disease) (HCC) Compensated on present regimen  Smoking cessation discussed   Plan  Patient Instructions  Continue on Spiriva 1 puff daily Mucinex DM Twice daily  As needed  Cough /congestion  Work on not smoking-this is most important .  Follow-up with Dr. Marchelle Gearing in 6 months and as needed        Rubye Oaks, NP 05/24/2018

## 2018-05-31 ENCOUNTER — Telehealth: Payer: Self-pay | Admitting: Internal Medicine

## 2018-05-31 MED ORDER — PREDNISONE 10 MG PO TABS: ORAL_TABLET | ORAL | 0 refills | Status: DC

## 2018-05-31 MED ORDER — DM-GUAIFENESIN ER 30-600 MG PO TB12: 1.0000 | ORAL_TABLET | Freq: Two times a day (BID) | ORAL | 1 refills | Status: DC | PRN

## 2018-05-31 MED ORDER — AZITHROMYCIN 250 MG PO TABS: ORAL_TABLET | ORAL | 0 refills | Status: DC

## 2018-05-31 NOTE — Telephone Encounter (Signed)
Tried to see if I could reach pt. Number that is provided is pt's Chad Moss. Unable to reach anyone at that number. Tried to call pt on the other number listed for pt's contact inforamtion but unable to get anyone at that number and unable to leave a VM due to not being set up yet. Due to Ezra Sites leaving a message recently for pt to call back, we will just await a call back.

## 2018-05-31 NOTE — Telephone Encounter (Signed)
Last seen 05-24-18 by TP     Return in about 6 months (around 11/23/2018) for Follow up with Dr. Marchelle Gearing.  Continue on Spiriva 1 puff daily Mucinex DM Twice daily  As needed  Cough /congestion  Work on not smoking-this is most important .  Follow-up with Dr. Marchelle Gearing in 6 months and as needed     Coughing up green and yellow-denies any SOB or wheezing. Slight chills last night.  Pt would like to have Zpak, Prednisone taper, and Mucinex DM Rx sent to [facility]. Medicaid will cover Mucinex if Rx'd.

## 2018-05-31 NOTE — Telephone Encounter (Signed)
meds have been sent to pt's pharmacy for pt. Called pt and spoke with [facility] Cherly Hensen letting her know this had been done. Bernadette expressed understanding. Nothing further needed.

## 2018-05-31 NOTE — Telephone Encounter (Signed)
Z pak  Please take prednisone 40 mg x1 day, then 30 mg x1 day, then 20 mg x1 day, then 10 mg x1 day, and then 5 mg x1 day and stop  Ok for mucinex refill as requested

## 2018-05-31 NOTE — Telephone Encounter (Signed)
ATC patient.  Left message to call back. Will try again later today.

## 2018-06-03 ENCOUNTER — Other Ambulatory Visit: Payer: Medicare Other | Admitting: *Deleted

## 2018-06-03 DIAGNOSIS — M7989 Other specified soft tissue disorders: Secondary | ICD-10-CM | POA: Diagnosis not present

## 2018-06-03 DIAGNOSIS — E785 Hyperlipidemia, unspecified: Secondary | ICD-10-CM

## 2018-06-03 DIAGNOSIS — I25119 Atherosclerotic heart disease of native coronary artery with unspecified angina pectoris: Secondary | ICD-10-CM

## 2018-06-03 DIAGNOSIS — I10 Essential (primary) hypertension: Secondary | ICD-10-CM

## 2018-06-03 LAB — BASIC METABOLIC PANEL
BUN/Creatinine Ratio: 8 — ABNORMAL LOW (ref 10–24)
BUN: 9 mg/dL (ref 8–27)
CO2: 23 mmol/L (ref 20–29)
Calcium: 8.8 mg/dL (ref 8.6–10.2)
Chloride: 103 mmol/L (ref 96–106)
Creatinine, Ser: 1.06 mg/dL (ref 0.76–1.27)
GFR calc Af Amer: 85 mL/min/{1.73_m2} (ref >59)
GFR calc non Af Amer: 74 mL/min/{1.73_m2} (ref >59)
Glucose: 153 mg/dL — ABNORMAL HIGH (ref 65–99)
Potassium: 3.6 mmol/L (ref 3.5–5.2)
Sodium: 141 mmol/L (ref 134–144)

## 2018-06-07 DIAGNOSIS — R3914 Feeling of incomplete bladder emptying: Secondary | ICD-10-CM | POA: Diagnosis not present

## 2018-06-07 DIAGNOSIS — N3281 Overactive bladder: Secondary | ICD-10-CM | POA: Diagnosis not present

## 2018-06-13 DIAGNOSIS — M79662 Pain in left lower leg: Secondary | ICD-10-CM | POA: Diagnosis not present

## 2018-06-13 DIAGNOSIS — M79605 Pain in left leg: Secondary | ICD-10-CM | POA: Diagnosis not present

## 2018-06-25 DIAGNOSIS — S76912D Strain of unspecified muscles, fascia and tendons at thigh level, left thigh, subsequent encounter: Secondary | ICD-10-CM | POA: Diagnosis not present

## 2018-06-27 DIAGNOSIS — S76912D Strain of unspecified muscles, fascia and tendons at thigh level, left thigh, subsequent encounter: Secondary | ICD-10-CM | POA: Diagnosis not present

## 2018-07-03 ENCOUNTER — Ambulatory Visit: Payer: Medicare Other | Admitting: Internal Medicine

## 2018-07-03 DIAGNOSIS — S76912D Strain of unspecified muscles, fascia and tendons at thigh level, left thigh, subsequent encounter: Secondary | ICD-10-CM | POA: Diagnosis not present

## 2018-07-04 DIAGNOSIS — S76912D Strain of unspecified muscles, fascia and tendons at thigh level, left thigh, subsequent encounter: Secondary | ICD-10-CM | POA: Diagnosis not present

## 2018-07-05 ENCOUNTER — Encounter: Payer: Self-pay | Admitting: Nurse Practitioner

## 2018-07-05 ENCOUNTER — Other Ambulatory Visit: Payer: Self-pay

## 2018-07-05 ENCOUNTER — Ambulatory Visit (INDEPENDENT_AMBULATORY_CARE_PROVIDER_SITE_OTHER): Payer: Medicare Other | Admitting: Nurse Practitioner

## 2018-07-05 VITALS — BP 132/72 | HR 93 | Temp 98.6°F | Ht 69.6 in | Wt 224.2 lb

## 2018-07-05 DIAGNOSIS — Z7982 Long term (current) use of aspirin: Secondary | ICD-10-CM

## 2018-07-05 DIAGNOSIS — K5090 Crohn's disease, unspecified, without complications: Secondary | ICD-10-CM

## 2018-07-05 DIAGNOSIS — I739 Peripheral vascular disease, unspecified: Secondary | ICD-10-CM | POA: Diagnosis not present

## 2018-07-05 DIAGNOSIS — E782 Mixed hyperlipidemia: Secondary | ICD-10-CM | POA: Diagnosis not present

## 2018-07-05 DIAGNOSIS — R7303 Prediabetes: Secondary | ICD-10-CM

## 2018-07-05 DIAGNOSIS — I10 Essential (primary) hypertension: Secondary | ICD-10-CM

## 2018-07-05 NOTE — Progress Notes (Addendum)
Subjective:     Patient ID: Chad Moss , male    DOB: [birth year] , 63 y.o.   MRN: [id number]   Chief Complaint  Patient presents with  . Diabetes    HPI  He has been to the urologist and had his medication changed  Diabetes  He presents for his follow-up diabetic visit. He has type 2 (prediabetes) diabetes mellitus. No MedicAlert identification noted. There are no diabetic associated symptoms. Diabetic complications include a CVA.  Hypertension  This is a chronic problem. The current episode started more than 1 year ago. The problem has been gradually improving since onset. The problem is controlled. There are no associated agents to hypertension. Risk factors for coronary artery disease include obesity and sedentary lifestyle. Past treatments include beta blockers. There are no compliance problems.  Hypertensive end-organ damage includes CVA. There is no history of angina. There is no history of chronic renal disease.     Past Medical History:  Diagnosis Date  . Allergic rhinitis   . Anxiety   . Atelectasis   . CAD (coronary artery disease), native coronary artery    9/18 PCI/DES x1 to mLCx, mild diffuse nonobstructive disease, EF 55% on Lv gram  . Chronic bronchitis (HCC)   . COPD (chronic obstructive pulmonary disease) (HCC)   . Crohn's disease (HCC)   . Depression   . DVT (deep venous thrombosis) (HCC)    RLE X 2  . Dysphagia   . Dyspnea   . Enlarged prostate   . GERD (gastroesophageal reflux disease)   . Glaucoma, both eyes   . Heart murmur   . History of stomach ulcers 1980s  . Hyperlipidemia   . Hypertension    "off RX for years now cause of coughing w/Lisinopril" (03/13/2017)  . IBS (irritable bowel syndrome)   . Paranoid schizophrenia (HCC)   . Peripheral vascular disease (HCC)   . Pneumonia 1990s  . Pre-diabetes    "one time" (03/13/2017)  . Stroke [facility]) 04/2016   "eye stroke; left eye" (03/13/2017)  . Syncopal episodes      Family History  Problem  Relation Age of Onset  . Hypertension Mother   . Heart disease Mother   . Hypertension Father   . Heart disease Father   . Lung cancer Brother        2006     Current Outpatient Medications:  .  albuterol (PROVENTIL) (2.5 MG/3ML) 0.083% nebulizer solution, Take 3 mLs (2.5 mg total) by nebulization every 6 (six) hours as needed for wheezing or shortness of breath., Disp: 120 mL, Rfl: 12 .  alfuzosin (UROXATRAL) 10 MG 24 hr tablet, Take 10 mg by mouth at bedtime. , Disp: , Rfl:  .  amLODipine (NORVASC) 5 MG tablet, Take 1 tablet (5 mg total) by mouth daily., Disp: 90 tablet, Rfl: 3 .  aspirin EC 81 MG tablet, Take 81 mg by mouth daily., Disp: , Rfl:  .  azithromycin (ZITHROMAX) 250 MG tablet, Take 2 today and then 1 daily until finished., Disp: 6 tablet, Rfl: 0 .  bimatoprost (LUMIGAN) 0.01 % SOLN, Place 1 drop into the left eye at bedtime., Disp: , Rfl:  .  brimonidine (ALPHAGAN) 0.15 % ophthalmic solution, Place 1 drop into the left eye 2 (two) times daily. , Disp: , Rfl:  .  cholestyramine (QUESTRAN) 4 G packet, Take 1 packet by mouth daily. , Disp: , Rfl:  .  clonazePAM (KLONOPIN) 1 MG tablet, Take 1 mg by mouth at  bedtime. Anxiety, Disp: , Rfl:  .  clopidogrel (PLAVIX) 75 MG tablet, Take 1 tablet (75 mg total) by mouth daily with breakfast., Disp: 90 tablet, Rfl: 3 .  dextromethorphan-guaiFENesin (MUCINEX DM) 30-600 MG 12hr tablet, Take 1 tablet by mouth 2 (two) times daily as needed for cough., Disp: 30 tablet, Rfl: 1 .  dicyclomine (BENTYL) 10 MG capsule, Take 10 mg by mouth 4 (four) times daily -  before meals and at bedtime. , Disp: , Rfl:  .  diphenhydrAMINE (BENADRYL) 25 MG tablet, Take 50 mg by mouth at bedtime. , Disp: , Rfl:  .  fesoterodine (TOVIAZ) 4 MG TB24 tablet, Take 4 mg by mouth daily., Disp: , Rfl:  .  finasteride (PROSCAR) 5 MG tablet, Take 5 mg by mouth daily., Disp: , Rfl:  .  furosemide (LASIX) 40 MG tablet, Take 1 tablet (40 mg total) by mouth daily., Disp: 90  tablet, Rfl: 3 .  isosorbide mononitrate (IMDUR) 30 MG 24 hr tablet, Take 1 tablet (30 mg total) by mouth daily., Disp: 90 tablet, Rfl: 3 .  loxapine (LOXITANE) 10 MG capsule, Take 10 mg by mouth 2 (two) times daily. 1 tab in AM and 2 tabs in PM, Disp: , Rfl:  .  mercaptopurine (PURINETHOL) 50 MG tablet, Take 75 mg by mouth daily. 1 tablet and a half tablet Give on an empty stomach 1 hour before or 2 hours after meals. Caution: Chemotherapy., Disp: , Rfl:  .  mesalamine (PENTASA) 250 MG CR capsule, Take 1,000 mg by mouth 4 (four) times daily. , Disp: , Rfl:  .  methocarbamol (ROBAXIN) 500 MG tablet, Take 1 tablet (500 mg total) by mouth 2 (two) times daily., Disp: 20 tablet, Rfl: 0 .  mirtazapine (REMERON) 30 MG tablet, Take 30 mg by mouth at bedtime. , Disp: , Rfl:  .  nitroGLYCERIN (NITROSTAT) 0.4 MG SL tablet, Place 1 tablet (0.4 mg total) under the tongue every 5 (five) minutes x 3 doses as needed for chest pain., Disp: 25 tablet, Rfl: 3 .  pantoprazole (PROTONIX) 40 MG tablet, TAKE 1 TABLET(40 MG) BY MOUTH TWICE DAILY, Disp: 60 tablet, Rfl: 5 .  potassium chloride (K-DUR) 10 MEQ tablet, Take 1 tablet (10 mEq total) by mouth 2 (two) times daily., Disp: 180 tablet, Rfl: 3 .  predniSONE (DELTASONE) 10 MG tablet, Take 4x1day,3x1day,2x1day,1x1day,0.5x1day,then stop, Disp: 20 tablet, Rfl: 0 .  PROAIR HFA 108 (90 Base) MCG/ACT inhaler, INHALE 2 PUFFS BY MOUTH INTO THE LUNGS EVERY 6 HOURS AS NEEDED FOR WHEEZING OR SHORTNESS OF BREATH, Disp: 8.5 g, Rfl: 5 .  rosuvastatin (CRESTOR) 20 MG tablet, Take 1 tablet (20 mg total) by mouth daily., Disp: 90 tablet, Rfl: 3 .  SPIRIVA HANDIHALER 18 MCG inhalation capsule, INHALE THE CONTENT OF 1 CAPSULE VIA HANDIHALER DAILY, Disp: 30 capsule, Rfl: 0 .  timolol (TIMOPTIC) 0.5 % ophthalmic solution, Place 1 drop into the left eye 2 (two) times daily., Disp: , Rfl:  .  traZODone (DESYREL) 50 MG tablet, Take 150 mg by mouth at bedtime., Disp: , Rfl:    Allergies   Allergen Reactions  . Benztropine Anaphylaxis  . Codeine Anaphylaxis  . Meperidine Swelling  . Cyclobenzaprine Other (See Comments)    Pt. Does not remember  . Penicillins Rash    Has patient had a PCN reaction causing immediate rash, facial/tongue/throat swelling, SOB or lightheadedness with hypotension: Yes Has patient had a PCN reaction causing severe rash involving mucus membranes or skin necrosis: No Has  patient had a PCN reaction that required hospitalization No Has patient had a PCN reaction occurring within the last 10 years: No If all of the above answers are "NO", then may proceed with Cephalosporin use.   . Sulfamethoxazole-Trimethoprim     REACTION: hives  . Sulfonamide Derivatives     REACTION: hives     Review of Systems  Constitutional: Negative.   Respiratory: Negative.   Cardiovascular: Negative.   Gastrointestinal: Negative.   Musculoskeletal: Negative.   Neurological: Negative.      Today's Vitals   07/05/18 1607  BP: 132/72  Pulse: 93  Temp: 98.6 F (37 C)  TempSrc: Oral  SpO2: 97%  Weight: 224 lb 3.2 oz (101.7 kg)  Height: 5' 9.6" (1.768 m)   Body mass index is 32.54 kg/m.   Objective:  Physical Exam Vitals signs reviewed.  Constitutional:      Appearance: Normal appearance.  Cardiovascular:     Rate and Rhythm: Normal rate and regular rhythm.     Pulses: Normal pulses.     Heart sounds: Normal heart sounds. No murmur.  Pulmonary:     Effort: Pulmonary effort is normal.     Breath sounds: Normal breath sounds.  Skin:    Capillary Refill: Capillary refill takes less than 2 seconds.  Neurological:     General: No focal deficit present.     Mental Status: He is alert.  Psychiatric:        Mood and Affect: Mood normal.         Assessment And Plan:     1. Prediabetes  Chronic, controlled  no current medications  Encouraged to limit intake of sugary foods and drinks  Encouraged to increase physical activity to 150 minutes per  week  He is doing really well - Hemoglobin A1c  2. Essential hypertension . B/P is controlled.  . CMP ordered to check renal function.  . The importance of regular exercise and dietary modification was stressed to the patient.  . Stressed importance of losing ten percent of his body weight  3. PERIPHERAL VASCULAR DISEASE  Taking clopidogrel daily  Decreased pedal pulses - CBC no Diff - CMP14 + Anion Gap - Hemoglobin A1c  4. Mixed hyperlipidemia  Chronic, controlled  Continue with current medications - CBC no Diff - CMP14 + Anion Gap - Hemoglobin A1c  5. Crohn's disease without complication, unspecified gastrointestinal tract location [facility])  Continues to be followed in [facility]  Will check requested labs and fax results due to risk for medication  - CRP (C-Reactive Protein)    Arnette Felts, FNP

## 2018-07-06 LAB — CMP14 + ANION GAP
ALT: 24 IU/L (ref 0–44)
AST: 31 IU/L (ref 0–40)
Albumin/Globulin Ratio: 1.5 (ref 1.2–2.2)
Albumin: 4 g/dL (ref 3.6–4.8)
Alkaline Phosphatase: 72 IU/L (ref 39–117)
Anion Gap: 16 mmol/L (ref 10.0–18.0)
BUN/Creatinine Ratio: 8 — ABNORMAL LOW (ref 10–24)
BUN: 9 mg/dL (ref 8–27)
Bilirubin Total: 0.3 mg/dL (ref 0.0–1.2)
CO2: 21 mmol/L (ref 20–29)
Calcium: 9 mg/dL (ref 8.6–10.2)
Chloride: 107 mmol/L — ABNORMAL HIGH (ref 96–106)
Creatinine, Ser: 1.06 mg/dL (ref 0.76–1.27)
GFR calc Af Amer: 85 mL/min/{1.73_m2} (ref >59)
GFR calc non Af Amer: 74 mL/min/{1.73_m2} (ref >59)
Globulin, Total: 2.7 g/dL (ref 1.5–4.5)
Glucose: 98 mg/dL (ref 65–99)
Potassium: 3.7 mmol/L (ref 3.5–5.2)
Sodium: 144 mmol/L (ref 134–144)
Total Protein: 6.7 g/dL (ref 6.0–8.5)

## 2018-07-06 LAB — CBC
Hematocrit: 35.1 % — ABNORMAL LOW (ref 37.5–51.0)
Hemoglobin: 12.5 g/dL — ABNORMAL LOW (ref 13.0–17.7)
MCH: 31.6 pg (ref 26.6–33.0)
MCHC: 35.6 g/dL (ref 31.5–35.7)
MCV: 89 fL (ref 79–97)
Platelets: 227 10*3/uL (ref 150–450)
RBC: 3.96 x10E6/uL — ABNORMAL LOW (ref 4.14–5.80)
RDW: 15 % (ref 11.6–15.4)
WBC: 6.8 10*3/uL (ref 3.4–10.8)

## 2018-07-06 LAB — HEMOGLOBIN A1C
Est. average glucose Bld gHb Est-mCnc: 117 mg/dL
Hgb A1c MFr Bld: 5.7 % — ABNORMAL HIGH (ref 4.8–5.6)

## 2018-07-06 LAB — C-REACTIVE PROTEIN: CRP: 1 mg/L (ref 0–10)

## 2018-07-10 DIAGNOSIS — S76912D Strain of unspecified muscles, fascia and tendons at thigh level, left thigh, subsequent encounter: Secondary | ICD-10-CM | POA: Diagnosis not present

## 2018-07-11 DIAGNOSIS — S76912D Strain of unspecified muscles, fascia and tendons at thigh level, left thigh, subsequent encounter: Secondary | ICD-10-CM | POA: Diagnosis not present

## 2018-07-16 ENCOUNTER — Encounter: Payer: Self-pay | Admitting: Nurse Practitioner

## 2018-07-17 DIAGNOSIS — S76912D Strain of unspecified muscles, fascia and tendons at thigh level, left thigh, subsequent encounter: Secondary | ICD-10-CM | POA: Diagnosis not present

## 2018-07-18 ENCOUNTER — Telehealth: Payer: Self-pay | Admitting: Internal Medicine

## 2018-07-18 DIAGNOSIS — J449 Chronic obstructive pulmonary disease, unspecified: Secondary | ICD-10-CM

## 2018-07-18 MED ORDER — ALBUTEROL SULFATE (2.5 MG/3ML) 0.083% IN NEBU: 2.5000 mg | INHALATION_SOLUTION | Freq: Four times a day (QID) | RESPIRATORY_TRACT | 5 refills | Status: DC | PRN

## 2018-07-18 MED ORDER — ALBUTEROL SULFATE HFA 108 (90 BASE) MCG/ACT IN AERS: INHALATION_SPRAY | RESPIRATORY_TRACT | 5 refills | Status: DC

## 2018-07-18 MED ORDER — TIOTROPIUM BROMIDE MONOHYDRATE 18 MCG IN CAPS: ORAL_CAPSULE | RESPIRATORY_TRACT | 5 refills | Status: DC

## 2018-07-18 NOTE — Telephone Encounter (Signed)
Spoke with pt's caretaker, Cherly Hensen. She is inquiring about the order that should have been placed for a new nebulizer machine. States this was discussed at his last OV. There is no order in the system, this has been taken care of. Pt is also needing refills on Proair, Albuterol neb solution and Spiriva. All 3 rxs have been sent in. Nothing further was needed at this time.

## 2018-07-19 DIAGNOSIS — J449 Chronic obstructive pulmonary disease, unspecified: Secondary | ICD-10-CM | POA: Diagnosis not present

## 2018-07-19 DIAGNOSIS — S76912D Strain of unspecified muscles, fascia and tendons at thigh level, left thigh, subsequent encounter: Secondary | ICD-10-CM | POA: Diagnosis not present

## 2018-07-23 DIAGNOSIS — S76912D Strain of unspecified muscles, fascia and tendons at thigh level, left thigh, subsequent encounter: Secondary | ICD-10-CM | POA: Diagnosis not present

## 2018-08-02 ENCOUNTER — Other Ambulatory Visit: Payer: Self-pay | Admitting: Interventional Cardiology

## 2018-08-07 DIAGNOSIS — S76912D Strain of unspecified muscles, fascia and tendons at thigh level, left thigh, subsequent encounter: Secondary | ICD-10-CM | POA: Diagnosis not present

## 2018-08-09 ENCOUNTER — Other Ambulatory Visit: Payer: Self-pay | Admitting: Interventional Cardiology

## 2018-08-09 DIAGNOSIS — S76912D Strain of unspecified muscles, fascia and tendons at thigh level, left thigh, subsequent encounter: Secondary | ICD-10-CM | POA: Diagnosis not present

## 2018-08-12 DIAGNOSIS — S76912D Strain of unspecified muscles, fascia and tendons at thigh level, left thigh, subsequent encounter: Secondary | ICD-10-CM | POA: Diagnosis not present

## 2018-08-16 DIAGNOSIS — S76912D Strain of unspecified muscles, fascia and tendons at thigh level, left thigh, subsequent encounter: Secondary | ICD-10-CM | POA: Diagnosis not present

## 2018-08-17 DIAGNOSIS — J449 Chronic obstructive pulmonary disease, unspecified: Secondary | ICD-10-CM | POA: Diagnosis not present

## 2018-08-20 DIAGNOSIS — S76912D Strain of unspecified muscles, fascia and tendons at thigh level, left thigh, subsequent encounter: Secondary | ICD-10-CM | POA: Diagnosis not present

## 2018-08-28 DIAGNOSIS — S76912D Strain of unspecified muscles, fascia and tendons at thigh level, left thigh, subsequent encounter: Secondary | ICD-10-CM | POA: Diagnosis not present

## 2018-08-28 DIAGNOSIS — M79605 Pain in left leg: Secondary | ICD-10-CM | POA: Diagnosis not present

## 2018-09-03 ENCOUNTER — Other Ambulatory Visit: Payer: Self-pay | Admitting: Interventional Cardiology

## 2018-09-04 DIAGNOSIS — S76912D Strain of unspecified muscles, fascia and tendons at thigh level, left thigh, subsequent encounter: Secondary | ICD-10-CM | POA: Diagnosis not present

## 2018-09-09 DIAGNOSIS — S76912D Strain of unspecified muscles, fascia and tendons at thigh level, left thigh, subsequent encounter: Secondary | ICD-10-CM | POA: Diagnosis not present

## 2018-09-11 DIAGNOSIS — S76912D Strain of unspecified muscles, fascia and tendons at thigh level, left thigh, subsequent encounter: Secondary | ICD-10-CM | POA: Diagnosis not present

## 2018-09-16 DIAGNOSIS — S76912D Strain of unspecified muscles, fascia and tendons at thigh level, left thigh, subsequent encounter: Secondary | ICD-10-CM | POA: Diagnosis not present

## 2018-09-17 DIAGNOSIS — J449 Chronic obstructive pulmonary disease, unspecified: Secondary | ICD-10-CM | POA: Diagnosis not present

## 2018-09-18 DIAGNOSIS — S76912D Strain of unspecified muscles, fascia and tendons at thigh level, left thigh, subsequent encounter: Secondary | ICD-10-CM | POA: Diagnosis not present

## 2018-09-27 ENCOUNTER — Other Ambulatory Visit: Payer: Self-pay | Admitting: Interventional Cardiology

## 2018-09-27 ENCOUNTER — Other Ambulatory Visit: Payer: Self-pay | Admitting: Nurse Practitioner

## 2018-09-30 DIAGNOSIS — S76912D Strain of unspecified muscles, fascia and tendons at thigh level, left thigh, subsequent encounter: Secondary | ICD-10-CM | POA: Diagnosis not present

## 2018-10-04 ENCOUNTER — Ambulatory Visit: Payer: Medicare Other | Admitting: Nurse Practitioner

## 2018-10-04 ENCOUNTER — Other Ambulatory Visit: Payer: Self-pay | Admitting: Interventional Cardiology

## 2018-10-07 DIAGNOSIS — S76912D Strain of unspecified muscles, fascia and tendons at thigh level, left thigh, subsequent encounter: Secondary | ICD-10-CM | POA: Diagnosis not present

## 2018-10-09 ENCOUNTER — Ambulatory Visit (INDEPENDENT_AMBULATORY_CARE_PROVIDER_SITE_OTHER): Payer: Medicare Other | Admitting: Nurse Practitioner

## 2018-10-09 ENCOUNTER — Encounter: Payer: Self-pay | Admitting: Nurse Practitioner

## 2018-10-09 ENCOUNTER — Other Ambulatory Visit: Payer: Self-pay

## 2018-10-09 VITALS — BP 136/76 | HR 88 | Temp 98.3°F | Ht 68.0 in | Wt 222.6 lb

## 2018-10-09 DIAGNOSIS — I10 Essential (primary) hypertension: Secondary | ICD-10-CM | POA: Diagnosis not present

## 2018-10-09 DIAGNOSIS — S76912D Strain of unspecified muscles, fascia and tendons at thigh level, left thigh, subsequent encounter: Secondary | ICD-10-CM | POA: Diagnosis not present

## 2018-10-09 DIAGNOSIS — K5090 Crohn's disease, unspecified, without complications: Secondary | ICD-10-CM | POA: Diagnosis not present

## 2018-10-09 DIAGNOSIS — R7303 Prediabetes: Secondary | ICD-10-CM

## 2018-10-09 NOTE — Progress Notes (Signed)
Subjective:     Patient ID: Chad Moss , male    DOB: 02/14/1956 , 63 y.o.   MRN: [id number]   Chief Complaint  Patient presents with  . Diabetes    HPI  Diabetes  He presents for his follow-up diabetic visit. Diabetes type: prediabetes. There are no hypoglycemic associated symptoms. There are no diabetic associated symptoms. There are no hypoglycemic complications. Symptoms are stable. There are no diabetic complications. Risk factors for coronary artery disease include male sex and sedentary lifestyle. Current diabetic treatment includes oral agent (monotherapy). He is compliant with treatment most of the time. His weight is stable. When asked about meal planning, he reported none. He rarely participates in exercise. He does not see a podiatrist.Eye exam is current.  Hypertension  This is a chronic problem. The current episode started more than 1 year ago. The problem has been waxing and waning since onset. The problem is controlled. Pertinent negatives include no anxiety.     Past Medical History:  Diagnosis Date  . Allergic rhinitis   . Anxiety   . Atelectasis   . CAD (coronary artery disease), native coronary artery    9/18 PCI/DES x1 to mLCx, mild diffuse nonobstructive disease, EF 55% on Lv gram  . Chronic bronchitis (HCC)   . COPD (chronic obstructive pulmonary disease) (HCC)   . Crohn's disease (HCC)   . Depression   . DVT (deep venous thrombosis) (HCC)    RLE X 2  . Dysphagia   . Dyspnea   . Enlarged prostate   . GERD (gastroesophageal reflux disease)   . Glaucoma, both eyes   . Heart murmur   . History of stomach ulcers 1980s  . Hyperlipidemia   . Hypertension    "off RX for years now cause of coughing w/Lisinopril" (03/13/2017)  . IBS (irritable bowel syndrome)   . Paranoid schizophrenia (HCC)   . Peripheral vascular disease (HCC)   . Pneumonia 1990s  . Pre-diabetes    "one time" (03/13/2017)  . Stroke [facility]) 04/2016   "eye stroke; left eye" (03/13/2017)   . Syncopal episodes      Family History  Problem Relation Age of Onset  . Hypertension Mother   . Heart disease Mother   . Hypertension Father   . Heart disease Father   . Lung cancer Brother        2006     Current Outpatient Medications:  .  albuterol (PROAIR HFA) 108 (90 Base) MCG/ACT inhaler, INHALE 2 PUFFS BY MOUTH INTO THE LUNGS EVERY 6 HOURS AS NEEDED FOR WHEEZING OR SHORTNESS OF BREATH, Disp: 8.5 g, Rfl: 5 .  albuterol (PROVENTIL) (2.5 MG/3ML) 0.083% nebulizer solution, Take 3 mLs (2.5 mg total) by nebulization every 6 (six) hours as needed for wheezing or shortness of breath., Disp: 300 mL, Rfl: 5 .  alfuzosin (UROXATRAL) 10 MG 24 hr tablet, Take 10 mg by mouth at bedtime. , Disp: , Rfl:  .  amLODipine (NORVASC) 5 MG tablet, TAKE 1 TABLET(5 MG) BY MOUTH DAILY, Disp: 90 tablet, Rfl: 2 .  aspirin EC 81 MG tablet, Take 81 mg by mouth daily., Disp: , Rfl:  .  bimatoprost (LUMIGAN) 0.01 % SOLN, Place 1 drop into the left eye at bedtime., Disp: , Rfl:  .  brimonidine (ALPHAGAN) 0.15 % ophthalmic solution, Place 1 drop into the left eye 2 (two) times daily. , Disp: , Rfl:  .  cholestyramine (QUESTRAN) 4 G packet, Take 1 packet by mouth daily. ,  Disp: , Rfl:  .  clonazePAM (KLONOPIN) 1 MG tablet, Take 1 mg by mouth at bedtime. Anxiety, Disp: , Rfl:  .  clopidogrel (PLAVIX) 75 MG tablet, TAKE 1 TABLET(75 MG) BY MOUTH DAILY WITH BREAKFAST, Disp: 90 tablet, Rfl: 3 .  dicyclomine (BENTYL) 10 MG capsule, Take 10 mg by mouth 4 (four) times daily -  before meals and at bedtime. , Disp: , Rfl:  .  diphenhydrAMINE (BENADRYL) 25 MG tablet, Take 50 mg by mouth at bedtime. , Disp: , Rfl:  .  fesoterodine (TOVIAZ) 4 MG TB24 tablet, Take 4 mg by mouth daily., Disp: , Rfl:  .  finasteride (PROSCAR) 5 MG tablet, Take 5 mg by mouth daily., Disp: , Rfl:  .  furosemide (LASIX) 40 MG tablet, Take 1 tablet (40 mg total) by mouth daily., Disp: 90 tablet, Rfl: 3 .  isosorbide mononitrate (IMDUR) 30 MG 24  hr tablet, TAKE 1 TABLET(30 MG) BY MOUTH DAILY, Disp: 90 tablet, Rfl: 3 .  loxapine (LOXITANE) 10 MG capsule, Take 10 mg by mouth 2 (two) times daily. 1 tab in AM and 2 tabs in PM, Disp: , Rfl:  .  mercaptopurine (PURINETHOL) 50 MG tablet, Take 75 mg by mouth daily. 1 tablet and a half tablet Give on an empty stomach 1 hour before or 2 hours after meals. Caution: Chemotherapy., Disp: , Rfl:  .  mesalamine (PENTASA) 250 MG CR capsule, Take 1,000 mg by mouth 4 (four) times daily. , Disp: , Rfl:  .  methocarbamol (ROBAXIN) 500 MG tablet, Take 1 tablet (500 mg total) by mouth 2 (two) times daily., Disp: 20 tablet, Rfl: 0 .  mirtazapine (REMERON) 30 MG tablet, Take 30 mg by mouth at bedtime. , Disp: , Rfl:  .  nitroGLYCERIN (NITROSTAT) 0.4 MG SL tablet, Place 1 tablet (0.4 mg total) under the tongue every 5 (five) minutes x 3 doses as needed for chest pain., Disp: 25 tablet, Rfl: 3 .  pantoprazole (PROTONIX) 40 MG tablet, TAKE 1 TABLET(40 MG) BY MOUTH TWICE DAILY, Disp: 180 tablet, Rfl: 1 .  potassium chloride (K-DUR) 10 MEQ tablet, Take 1 tablet (10 mEq total) by mouth 2 (two) times daily., Disp: 180 tablet, Rfl: 3 .  rosuvastatin (CRESTOR) 20 MG tablet, TAKE 1 TABLET(20 MG) BY MOUTH DAILY, Disp: 90 tablet, Rfl: 1 .  timolol (TIMOPTIC) 0.5 % ophthalmic solution, Place 1 drop into the left eye 2 (two) times daily., Disp: , Rfl:  .  tiotropium (SPIRIVA HANDIHALER) 18 MCG inhalation capsule, INHALE THE CONTENT OF 1 CAPSULE VIA HANDIHALER DAILY, Disp: 30 capsule, Rfl: 5 .  traZODone (DESYREL) 50 MG tablet, Take 150 mg by mouth at bedtime., Disp: , Rfl:  .  azithromycin (ZITHROMAX) 250 MG tablet, Take 2 today and then 1 daily until finished. (Patient not taking: Reported on 10/09/2018), Disp: 6 tablet, Rfl: 0 .  dextromethorphan-guaiFENesin (MUCINEX DM) 30-600 MG 12hr tablet, Take 1 tablet by mouth 2 (two) times daily as needed for cough. (Patient not taking: Reported on 10/09/2018), Disp: 30 tablet, Rfl: 1 .   predniSONE (DELTASONE) 10 MG tablet, Take 4x1day,3x1day,2x1day,1x1day,0.5x1day,then stop (Patient not taking: Reported on 10/09/2018), Disp: 20 tablet, Rfl: 0   Allergies  Allergen Reactions  . Benztropine Anaphylaxis  . Codeine Anaphylaxis  . Meperidine Swelling  . Cyclobenzaprine Other (See Comments)    Pt. Does not remember  . Penicillins Rash    Has patient had a PCN reaction causing immediate rash, facial/tongue/throat swelling, SOB or lightheadedness with hypotension:  Yes Has patient had a PCN reaction causing severe rash involving mucus membranes or skin necrosis: No Has patient had a PCN reaction that required hospitalization No Has patient had a PCN reaction occurring within the last 10 years: No If all of the above answers are "NO", then may proceed with Cephalosporin use.   . Sulfamethoxazole-Trimethoprim     REACTION: hives  . Sulfonamide Derivatives     REACTION: hives     Review of Systems  Constitutional: Negative.   Respiratory: Negative.   Cardiovascular: Negative.   Gastrointestinal: Negative.   Musculoskeletal: Negative.   Neurological: Negative.      Today's Vitals   10/09/18 1109  BP: 136/76  Pulse: 88  Temp: 98.3 F (36.8 C)  TempSrc: Oral  SpO2: 97%  Weight: 222 lb 9.6 oz (101 kg)  Height: 5\' 8"  (1.727 m)   Body mass index is 33.85 kg/m.   Objective:  Physical Exam Vitals signs reviewed.  Constitutional:      Appearance: Normal appearance.  Cardiovascular:     Rate and Rhythm: Normal rate and regular rhythm.     Pulses: Normal pulses.     Heart sounds: Normal heart sounds. No murmur.  Pulmonary:     Effort: Pulmonary effort is normal.     Breath sounds: Normal breath sounds.  Skin:    Capillary Refill: Capillary refill takes less than 2 seconds.  Neurological:     General: No focal deficit present.     Mental Status: He is alert.  Psychiatric:        Mood and Affect: Mood normal.         Assessment And Plan:     1.  Prediabetes  Chronic  Continue with current medications  Decrease intake of sugary foods and drinks - Hemoglobin A1c - CBC with Diff  2. Essential hypertension . B/P is controlled.  . CMP ordered to check renal function.  . The importance of regular exercise and dietary modification was stressed to the patient.  - CMP14 + Anion Gap  3. Crohn's disease without complication, unspecified gastrointestinal tract location [facility])  Chronic  Continue with follow up with GI at [facility] - CRP (C-Reactive Protein)   Arnette Felts, FNP    THE PATIENT IS ENCOURAGED TO PRACTICE SOCIAL DISTANCING DUE TO THE COVID-19 PANDEMIC.

## 2018-10-10 LAB — CBC WITH DIFFERENTIAL/PLATELET
Basophils Absolute: 0 10*3/uL (ref 0.0–0.2)
Basos: 1 %
EOS (ABSOLUTE): 0 10*3/uL (ref 0.0–0.4)
Eos: 1 %
Hematocrit: 38.3 % (ref 37.5–51.0)
Hemoglobin: 13.3 g/dL (ref 13.0–17.7)
Immature Grans (Abs): 0 10*3/uL (ref 0.0–0.1)
Immature Granulocytes: 0 %
Lymphocytes Absolute: 1.2 10*3/uL (ref 0.7–3.1)
Lymphs: 19 %
MCH: 31.2 pg (ref 26.6–33.0)
MCHC: 34.7 g/dL (ref 31.5–35.7)
MCV: 90 fL (ref 79–97)
Monocytes Absolute: 0.4 10*3/uL (ref 0.1–0.9)
Monocytes: 7 %
Neutrophils Absolute: 4.6 10*3/uL (ref 1.4–7.0)
Neutrophils: 72 %
Platelets: 206 10*3/uL (ref 150–450)
RBC: 4.26 x10E6/uL (ref 4.14–5.80)
RDW: 15.4 % (ref 11.6–15.4)
WBC: 6.3 10*3/uL (ref 3.4–10.8)

## 2018-10-10 LAB — CMP14 + ANION GAP
ALT: 25 IU/L (ref 0–44)
AST: 25 IU/L (ref 0–40)
Albumin/Globulin Ratio: 1.6 (ref 1.2–2.2)
Albumin: 4.2 g/dL (ref 3.8–4.8)
Alkaline Phosphatase: 83 IU/L (ref 39–117)
Anion Gap: 16 mmol/L (ref 10.0–18.0)
BUN/Creatinine Ratio: 6 — ABNORMAL LOW (ref 10–24)
BUN: 6 mg/dL — ABNORMAL LOW (ref 8–27)
Bilirubin Total: 0.3 mg/dL (ref 0.0–1.2)
CO2: 23 mmol/L (ref 20–29)
Calcium: 9 mg/dL (ref 8.6–10.2)
Chloride: 103 mmol/L (ref 96–106)
Creatinine, Ser: 1.02 mg/dL (ref 0.76–1.27)
GFR calc Af Amer: 89 mL/min/{1.73_m2} (ref >59)
GFR calc non Af Amer: 77 mL/min/{1.73_m2} (ref >59)
Globulin, Total: 2.7 g/dL (ref 1.5–4.5)
Glucose: 105 mg/dL — ABNORMAL HIGH (ref 65–99)
Potassium: 3.7 mmol/L (ref 3.5–5.2)
Sodium: 142 mmol/L (ref 134–144)
Total Protein: 6.9 g/dL (ref 6.0–8.5)

## 2018-10-10 LAB — HEMOGLOBIN A1C
Est. average glucose Bld gHb Est-mCnc: 128 mg/dL
Hgb A1c MFr Bld: 6.1 % — ABNORMAL HIGH (ref 4.8–5.6)

## 2018-10-10 LAB — C-REACTIVE PROTEIN: CRP: 1 mg/L (ref 0–10)

## 2018-10-11 ENCOUNTER — Telehealth: Payer: Self-pay

## 2018-10-11 NOTE — Telephone Encounter (Signed)
-----   Message from Janece Moore, FNP sent at 10/10/2018 11:17 AM EDT -----  Your CRP is normal.  Hgba1c is up to 6.1 was 5.7 please limit your intake of sugary foods and drinks.  Blood levels are stable. Kidney functions are normal.    

## 2018-10-11 NOTE — Telephone Encounter (Signed)
2nd attempt to give results 

## 2018-10-14 DIAGNOSIS — S76912D Strain of unspecified muscles, fascia and tendons at thigh level, left thigh, subsequent encounter: Secondary | ICD-10-CM | POA: Diagnosis not present

## 2018-10-16 DIAGNOSIS — S76912D Strain of unspecified muscles, fascia and tendons at thigh level, left thigh, subsequent encounter: Secondary | ICD-10-CM | POA: Diagnosis not present

## 2018-10-17 DIAGNOSIS — J449 Chronic obstructive pulmonary disease, unspecified: Secondary | ICD-10-CM | POA: Diagnosis not present

## 2018-10-18 ENCOUNTER — Telehealth: Payer: Self-pay

## 2018-10-18 NOTE — Telephone Encounter (Signed)
3rd attempt to give results . Letter mailed.

## 2018-10-18 NOTE — Telephone Encounter (Signed)
-----   Message from Arnette Felts, FNP sent at 10/10/2018 11:17 AM EDT -----  Your CRP is normal.  Hgba1c is up to 6.1 was 5.7 please limit your intake of sugary foods and drinks.  Blood levels are stable. Kidney functions are normal.

## 2018-10-23 DIAGNOSIS — S76912D Strain of unspecified muscles, fascia and tendons at thigh level, left thigh, subsequent encounter: Secondary | ICD-10-CM | POA: Diagnosis not present

## 2018-11-15 ENCOUNTER — Telehealth: Payer: Self-pay | Admitting: Internal Medicine

## 2018-11-15 NOTE — Telephone Encounter (Signed)
Left message for Chad Moss to call back.

## 2018-11-17 DIAGNOSIS — J449 Chronic obstructive pulmonary disease, unspecified: Secondary | ICD-10-CM | POA: Diagnosis not present

## 2018-11-18 ENCOUNTER — Telehealth: Payer: Self-pay

## 2018-11-18 NOTE — Telephone Encounter (Signed)
Called and spoke with Chad Moss at [phone]. Scheduled video visit with TN at 11/19/18 at 11am She verbalized and expressed understanding Nothing further is needed

## 2018-11-18 NOTE — Telephone Encounter (Signed)
Primary Pulmonologist: MR Last office visit and with whom: 05/24/18 with TP What do we see them for (pulmonary problems): COPD Last OV assessment/plan: Instructions   Return in about 6 months (around 11/23/2018) for Follow up with Dr. Marchelle Gearing.  Continue on Spiriva 1 puff daily Mucinex DM Twice daily  As needed  Cough /congestion  Work on not smoking-this is most important .  Follow-up with Dr. Marchelle Gearing in 6 months and as needed       Was appointment offered to patient (explain)?  Pt and Bernadette want recommendations   Reason for call: Called and spoke with Cherly Hensen who states pt has been coughing up yellow phlegm x1 week.  Cherly Hensen stated that pt has had some SOB due to coughing and she did a neb tx on pt yesterday 5/31 which she stated helped with his symptoms. Pt is doing spiriva daily as prescribed. Per Cherly Hensen, pt has not used the rescue inhaler.  Pt denies any complaints of fever, body aches, or chills.  Cherly Hensen stated that pt is still smoking about a pack and a half daily and she believes this is contributing to pt's symptoms.  Bernadette and pt want recommendations to help with pt's symptoms. Tammy, please advise on this for pt. Thanks!

## 2018-11-18 NOTE — Telephone Encounter (Signed)
This patient needs a video visit /televist to be seen by one of the APPs with an opening .  I believe Archie Patten has openings this evening

## 2018-11-18 NOTE — Telephone Encounter (Signed)
Tried to call pt to set up evisit. Voicemail was not set up on his cell and house number listed is someone else's number. Will try again later.

## 2018-11-18 NOTE — Telephone Encounter (Signed)
Attempted to call Cherly Hensen (EC) in regards to message from TP on pt but unable to reach. Left message for Cherly Hensen to return call.  When [place] returns call, please schedule pt a mychart video visit with Angus Seller. Thanks!

## 2018-11-18 NOTE — Telephone Encounter (Signed)
Chad Moss is returning call. Cb is [phone].

## 2018-11-19 ENCOUNTER — Telehealth: Payer: Self-pay | Admitting: Nurse Practitioner

## 2018-11-19 ENCOUNTER — Emergency Department (HOSPITAL_COMMUNITY): Payer: Medicare Other

## 2018-11-19 ENCOUNTER — Encounter: Payer: Self-pay | Admitting: Pulmonary Disease

## 2018-11-19 ENCOUNTER — Other Ambulatory Visit: Payer: Self-pay

## 2018-11-19 ENCOUNTER — Ambulatory Visit: Payer: Medicare Other | Admitting: Adult Health

## 2018-11-19 ENCOUNTER — Encounter (HOSPITAL_COMMUNITY): Payer: Self-pay | Admitting: Emergency Medicine

## 2018-11-19 ENCOUNTER — Emergency Department (HOSPITAL_COMMUNITY)
Admission: EM | Admit: 2018-11-19 | Discharge: 2018-11-19 | Disposition: A | Discharge status: Home / Self Care | Payer: Medicare Other | Attending: Emergency Medicine | Admitting: Emergency Medicine

## 2018-11-19 ENCOUNTER — Ambulatory Visit (INDEPENDENT_AMBULATORY_CARE_PROVIDER_SITE_OTHER): Payer: Medicare Other | Admitting: Pulmonary Disease

## 2018-11-19 ENCOUNTER — Telehealth: Payer: Medicare Other | Admitting: Nurse Practitioner

## 2018-11-19 VITALS — BP 110/68 | HR 105 | Temp 100.9°F | Ht 71.0 in | Wt 214.4 lb

## 2018-11-19 DIAGNOSIS — Z8673 Personal history of transient ischemic attack (TIA), and cerebral infarction without residual deficits: Secondary | ICD-10-CM | POA: Diagnosis not present

## 2018-11-19 DIAGNOSIS — Z754 Unavailability and inaccessibility of other helping agencies: Secondary | ICD-10-CM

## 2018-11-19 DIAGNOSIS — Z955 Presence of coronary angioplasty implant and graft: Secondary | ICD-10-CM | POA: Diagnosis not present

## 2018-11-19 DIAGNOSIS — R509 Fever, unspecified: Secondary | ICD-10-CM

## 2018-11-19 DIAGNOSIS — R109 Unspecified abdominal pain: Secondary | ICD-10-CM | POA: Diagnosis not present

## 2018-11-19 DIAGNOSIS — J449 Chronic obstructive pulmonary disease, unspecified: Secondary | ICD-10-CM | POA: Diagnosis not present

## 2018-11-19 DIAGNOSIS — Z79899 Other long term (current) drug therapy: Secondary | ICD-10-CM | POA: Diagnosis not present

## 2018-11-19 DIAGNOSIS — R0902 Hypoxemia: Secondary | ICD-10-CM | POA: Diagnosis not present

## 2018-11-19 DIAGNOSIS — F17210 Nicotine dependence, cigarettes, uncomplicated: Secondary | ICD-10-CM

## 2018-11-19 DIAGNOSIS — Z658 Other specified problems related to psychosocial circumstances: Secondary | ICD-10-CM | POA: Diagnosis not present

## 2018-11-19 DIAGNOSIS — I10 Essential (primary) hypertension: Secondary | ICD-10-CM | POA: Insufficient documentation

## 2018-11-19 DIAGNOSIS — J189 Pneumonia, unspecified organism: Secondary | ICD-10-CM | POA: Diagnosis not present

## 2018-11-19 DIAGNOSIS — R0602 Shortness of breath: Secondary | ICD-10-CM | POA: Diagnosis not present

## 2018-11-19 DIAGNOSIS — R05 Cough: Secondary | ICD-10-CM | POA: Diagnosis not present

## 2018-11-19 DIAGNOSIS — R197 Diarrhea, unspecified: Secondary | ICD-10-CM | POA: Diagnosis not present

## 2018-11-19 DIAGNOSIS — K7689 Other specified diseases of liver: Secondary | ICD-10-CM | POA: Diagnosis not present

## 2018-11-19 DIAGNOSIS — R110 Nausea: Secondary | ICD-10-CM | POA: Insufficient documentation

## 2018-11-19 DIAGNOSIS — Z7982 Long term (current) use of aspirin: Secondary | ICD-10-CM | POA: Insufficient documentation

## 2018-11-19 DIAGNOSIS — J1289 Other viral pneumonia: Secondary | ICD-10-CM | POA: Insufficient documentation

## 2018-11-19 DIAGNOSIS — I700 Atherosclerosis of aorta: Secondary | ICD-10-CM | POA: Diagnosis not present

## 2018-11-19 DIAGNOSIS — Z720 Tobacco use: Secondary | ICD-10-CM

## 2018-11-19 DIAGNOSIS — I2510 Atherosclerotic heart disease of native coronary artery without angina pectoris: Secondary | ICD-10-CM | POA: Insufficient documentation

## 2018-11-19 DIAGNOSIS — U071 COVID-19: Secondary | ICD-10-CM | POA: Diagnosis not present

## 2018-11-19 DIAGNOSIS — Z8709 Personal history of other diseases of the respiratory system: Secondary | ICD-10-CM | POA: Insufficient documentation

## 2018-11-19 DIAGNOSIS — R918 Other nonspecific abnormal finding of lung field: Secondary | ICD-10-CM | POA: Diagnosis not present

## 2018-11-19 HISTORY — DX: Diarrhea, unspecified: R19.7

## 2018-11-19 HISTORY — DX: Unavailability and inaccessibility of other helping agencies: Z75.4

## 2018-11-19 HISTORY — DX: Fever, unspecified: R50.9

## 2018-11-19 LAB — COMPREHENSIVE METABOLIC PANEL
ALT: 60 U/L — ABNORMAL HIGH (ref 0–44)
AST: 54 U/L — ABNORMAL HIGH (ref 15–41)
Albumin: 3.3 g/dL — ABNORMAL LOW (ref 3.5–5.0)
Alkaline Phosphatase: 43 U/L (ref 38–126)
Anion gap: 10 (ref 5–15)
BUN: 6 mg/dL — ABNORMAL LOW (ref 8–23)
CO2: 27 mmol/L (ref 22–32)
Calcium: 8.1 mg/dL — ABNORMAL LOW (ref 8.9–10.3)
Chloride: 102 mmol/L (ref 98–111)
Creatinine, Ser: 1.01 mg/dL (ref 0.61–1.24)
GFR calc Af Amer: >60 mL/min (ref >60)
GFR calc non Af Amer: >60 mL/min (ref >60)
Glucose, Bld: 97 mg/dL (ref 70–99)
Potassium: 3.1 mmol/L — ABNORMAL LOW (ref 3.5–5.1)
Sodium: 139 mmol/L (ref 135–145)
Total Bilirubin: 0.7 mg/dL (ref 0.3–1.2)
Total Protein: 7.1 g/dL (ref 6.5–8.1)

## 2018-11-19 LAB — URINALYSIS, ROUTINE W REFLEX MICROSCOPIC
Bacteria, UA: NONE SEEN
Bilirubin Urine: NEGATIVE
Glucose, UA: NEGATIVE mg/dL
Ketones, ur: NEGATIVE mg/dL
Leukocytes,Ua: NEGATIVE
Nitrite: NEGATIVE
Protein, ur: 30 mg/dL — AB
Specific Gravity, Urine: 1.013 (ref 1.005–1.030)
pH: 5 (ref 5.0–8.0)

## 2018-11-19 LAB — SARS CORONAVIRUS 2 BY RT PCR (HOSPITAL ORDER, PERFORMED IN ~~LOC~~ HOSPITAL LAB): SARS Coronavirus 2: POSITIVE — AB

## 2018-11-19 LAB — CBC WITH DIFFERENTIAL/PLATELET
Abs Immature Granulocytes: 0.01 10*3/uL (ref 0.00–0.07)
Basophils Absolute: 0 10*3/uL (ref 0.0–0.1)
Basophils Relative: 0 %
Eosinophils Absolute: 0 10*3/uL (ref 0.0–0.5)
Eosinophils Relative: 0 %
HCT: 33.3 % — ABNORMAL LOW (ref 39.0–52.0)
Hemoglobin: 11.6 g/dL — ABNORMAL LOW (ref 13.0–17.0)
Immature Granulocytes: 0 %
Lymphocytes Relative: 14 %
Lymphs Abs: 0.5 10*3/uL — ABNORMAL LOW (ref 0.7–4.0)
MCH: 31.7 pg (ref 26.0–34.0)
MCHC: 34.8 g/dL (ref 30.0–36.0)
MCV: 91 fL (ref 80.0–100.0)
Monocytes Absolute: 0.3 10*3/uL (ref 0.1–1.0)
Monocytes Relative: 9 %
Neutro Abs: 2.7 10*3/uL (ref 1.7–7.7)
Neutrophils Relative %: 77 %
Platelets: 172 10*3/uL (ref 150–400)
RBC: 3.66 MIL/uL — ABNORMAL LOW (ref 4.22–5.81)
RDW: 15.6 % — ABNORMAL HIGH (ref 11.5–15.5)
WBC: 3.5 10*3/uL — ABNORMAL LOW (ref 4.0–10.5)
nRBC: 0 % (ref 0.0–0.2)

## 2018-11-19 LAB — BRAIN NATRIURETIC PEPTIDE: B Natriuretic Peptide: 8 pg/mL (ref 0.0–100.0)

## 2018-11-19 IMAGING — DX PORTABLE CHEST - 1 VIEW
1 series · 1 of 1 positions shown · non-contrast
Comparison: [DATE].

CLINICAL DATA: Short of breath.

EXAM:
PORTABLE CHEST 1 VIEW

[chest ap]
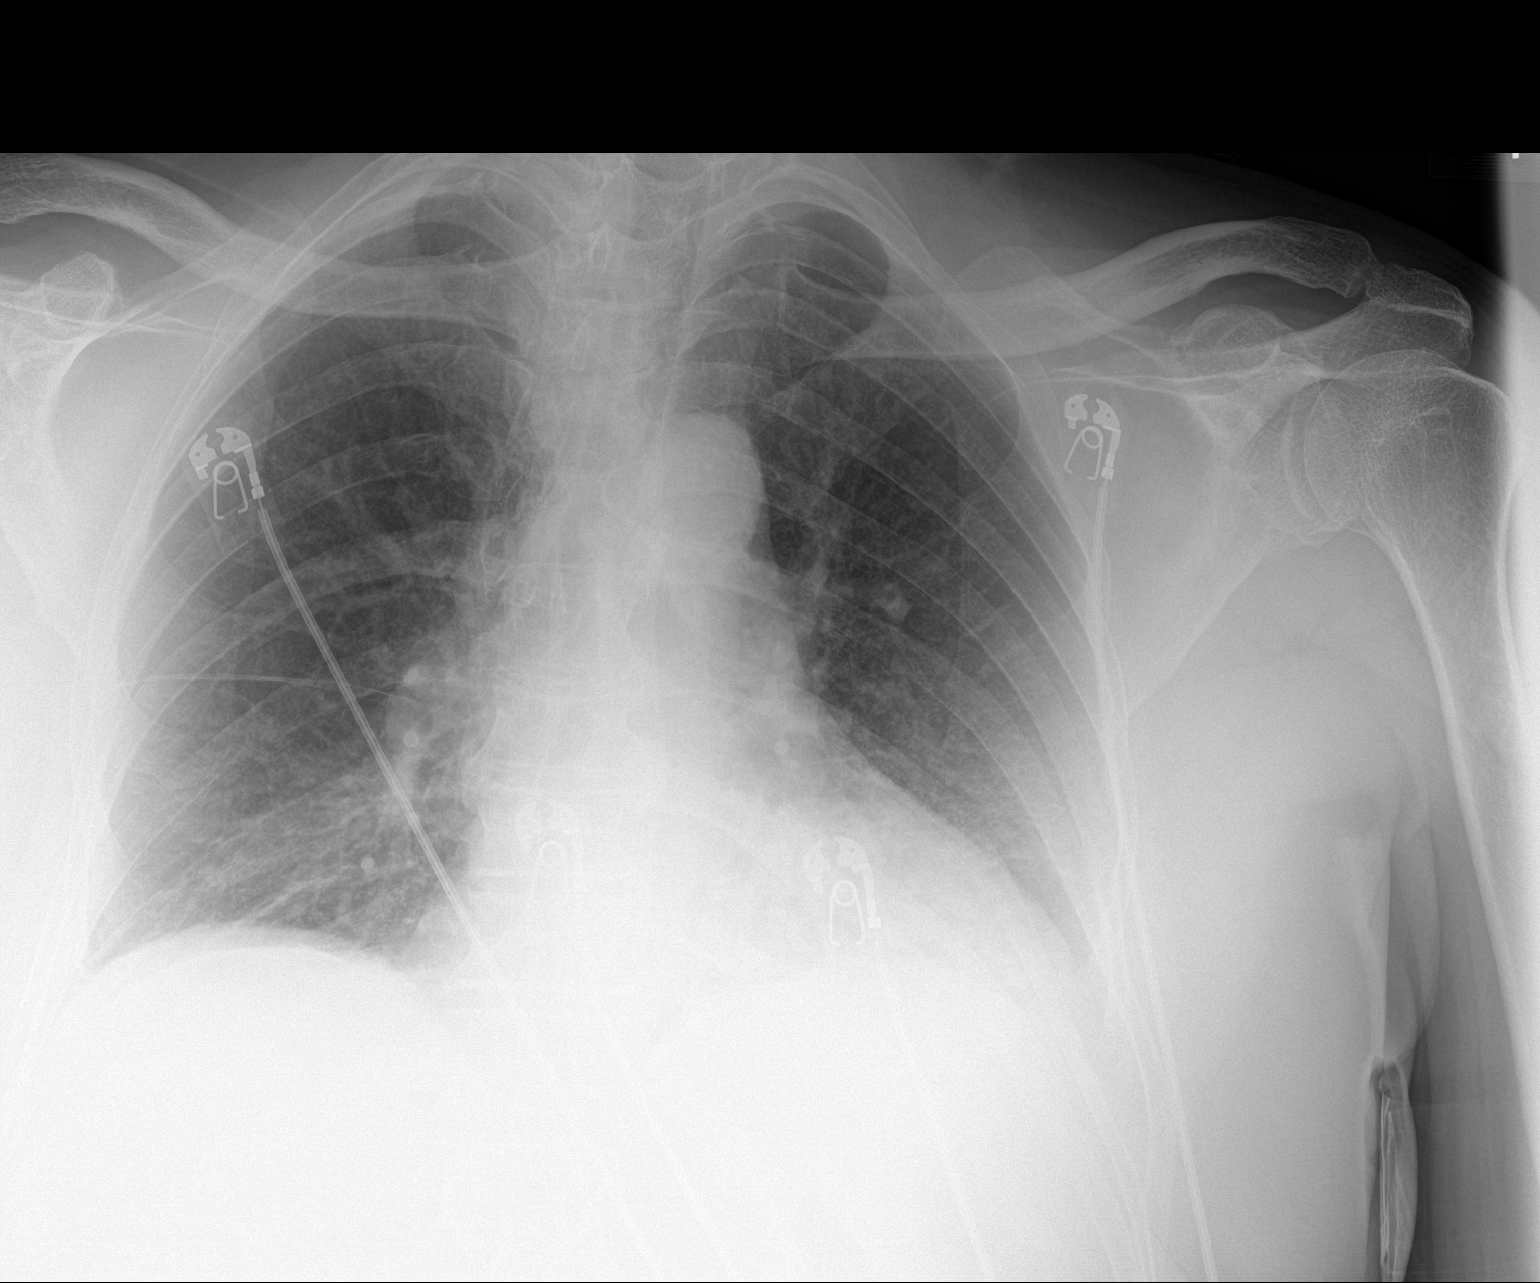

[1 of 1 positions shown; findings below may reference images not displayed]

FINDINGS: The heart size and mediastinal contours are within normal limits.
Decreased lung volumes. Both lungs are clear. The visualized
skeletal structures are unremarkable.
IMPRESSION: 1. Low lung volumes.
2. No acute findings.

## 2018-11-19 IMAGING — CT CT ABDOMEN AND PELVIS WITH CONTRAST
2 of 5 series · 14 of 46 positions shown, 16 images · IV contrast (ISOVUE)
Comparison: CT scan of [DATE] and [DATE].

CLINICAL DATA: Productive cough, shortness of breath. Weight loss,
diarrhea.

EXAM:
CT CHEST, ABDOMEN, AND PELVIS WITH CONTRAST
TECHNIQUE: Multidetector CT imaging of the chest, abdomen and pelvis was
performed following the standard protocol during bolus
administration of intravenous contrast.
CONTRAST:  100mL OMNIPAQUE IOHEXOL 300 MG/ML  SOLN

[Series 2: cap with · axial · 0.79mm/px · z∈[+1091,+1666]mm · 11 of 137 slices shown, 13 images]
[im 11/137  soft-tissue]
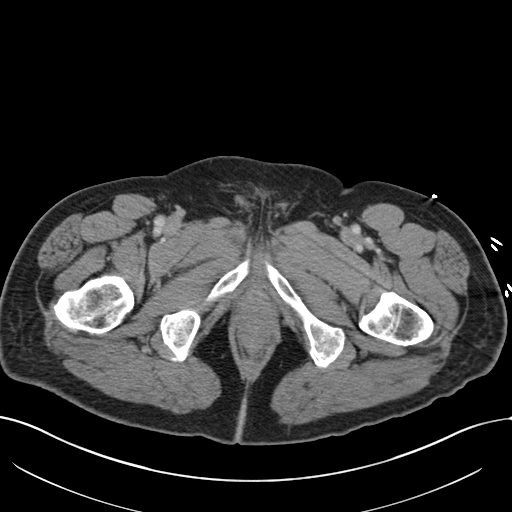
[im 11/137  bone]
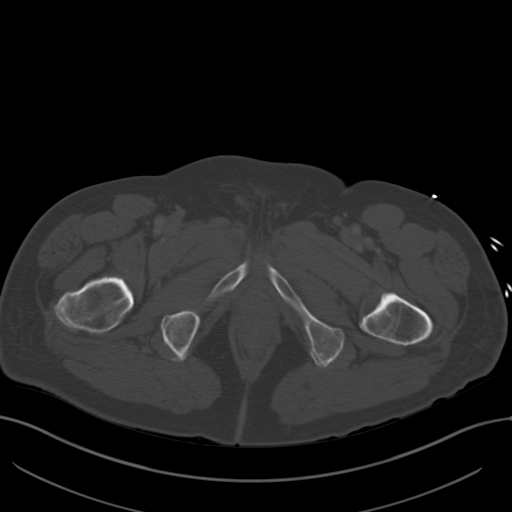
[im 21/137  soft-tissue]
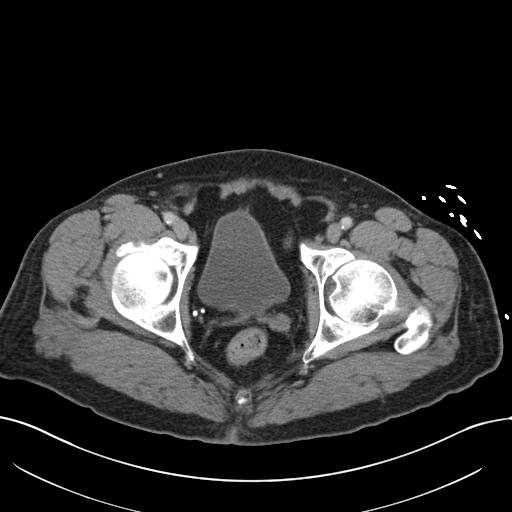
[im 32/137  soft-tissue]
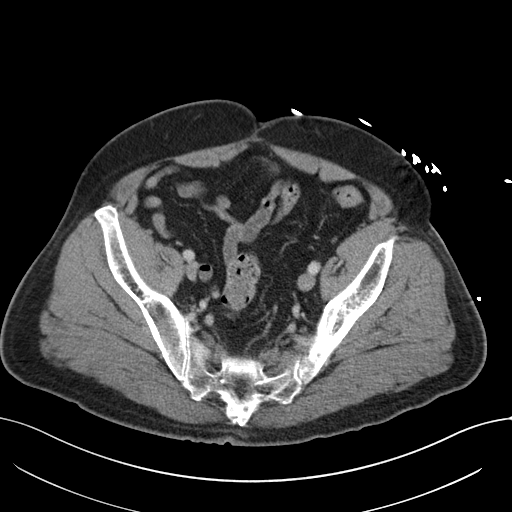
[im 42/137  soft-tissue]
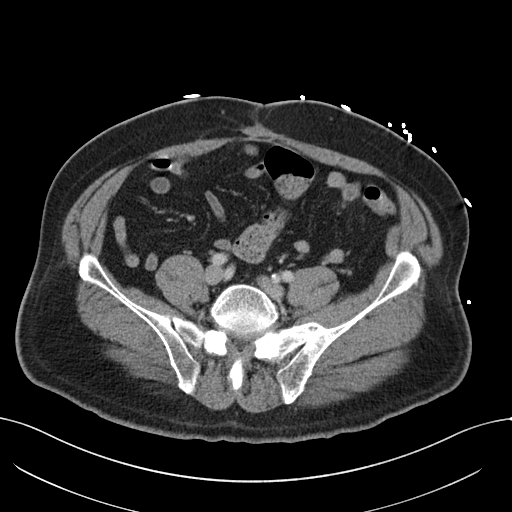
[im 53/137  soft-tissue]
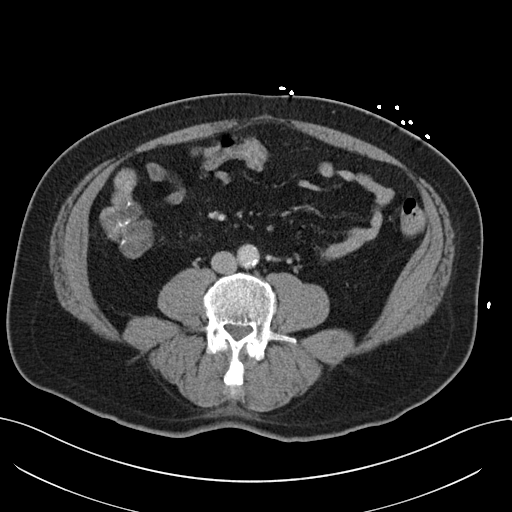
[im 74/137  soft-tissue]
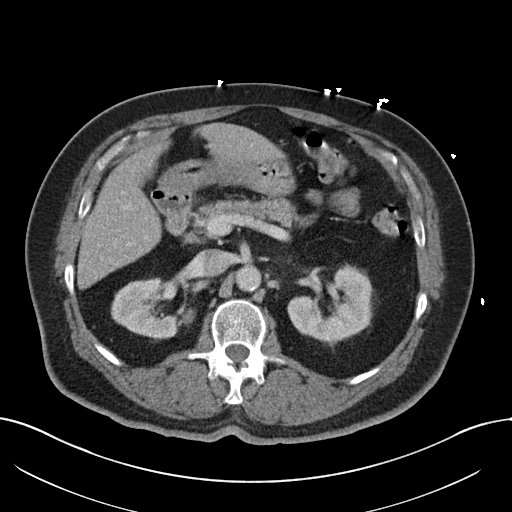
[im 84/137  soft-tissue]
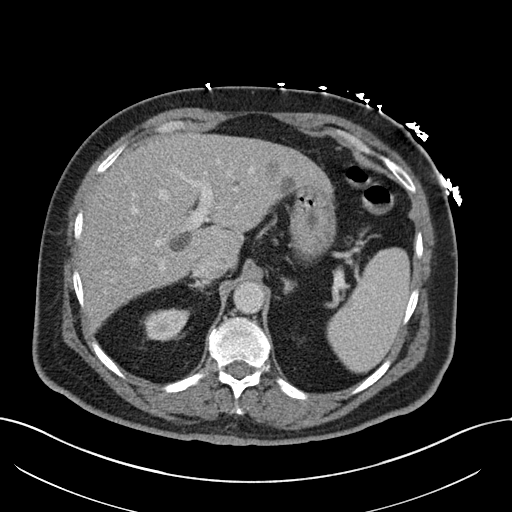
[im 95/137  soft-tissue]
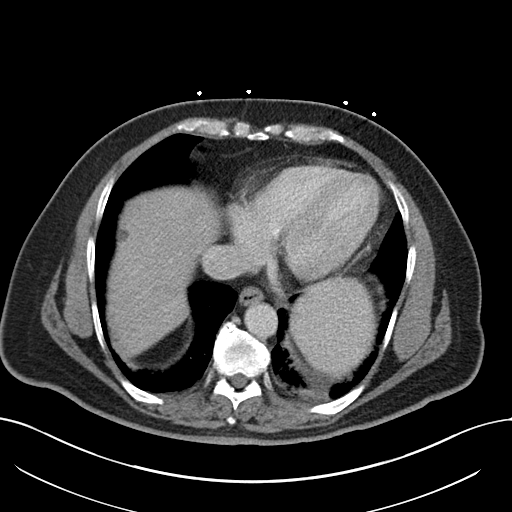
[im 105/137  soft-tissue]
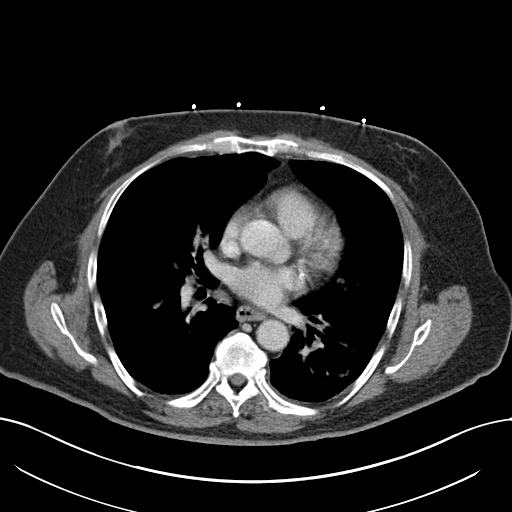
[im 105/137  bone]
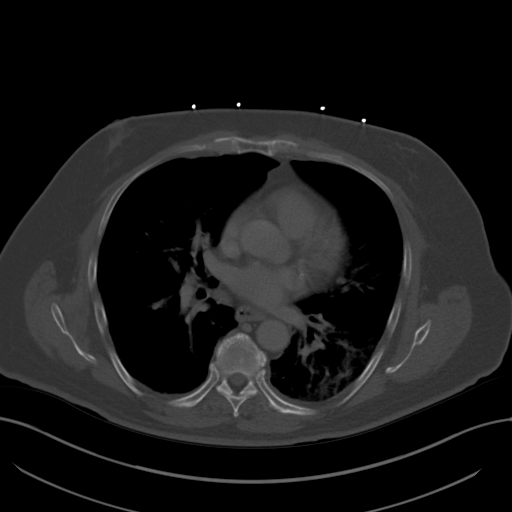
[im 116/137  soft-tissue]
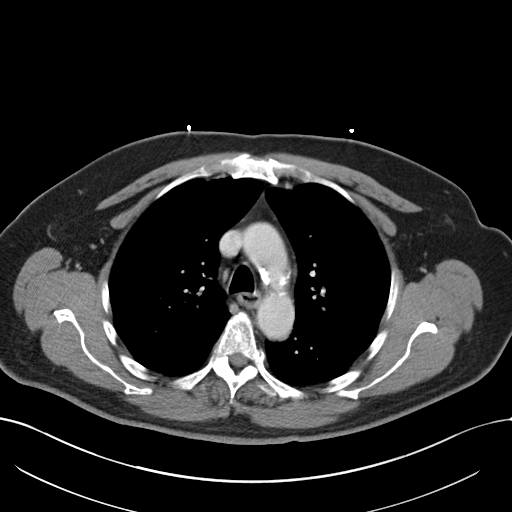
[im 126/137  soft-tissue]
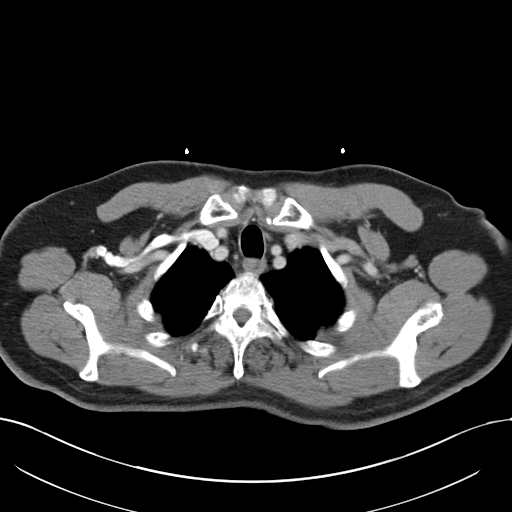

[Series 5: coronals · coronal · 0.82mm/px · 3 of 147 slices shown]
[im 49/147  soft-tissue]
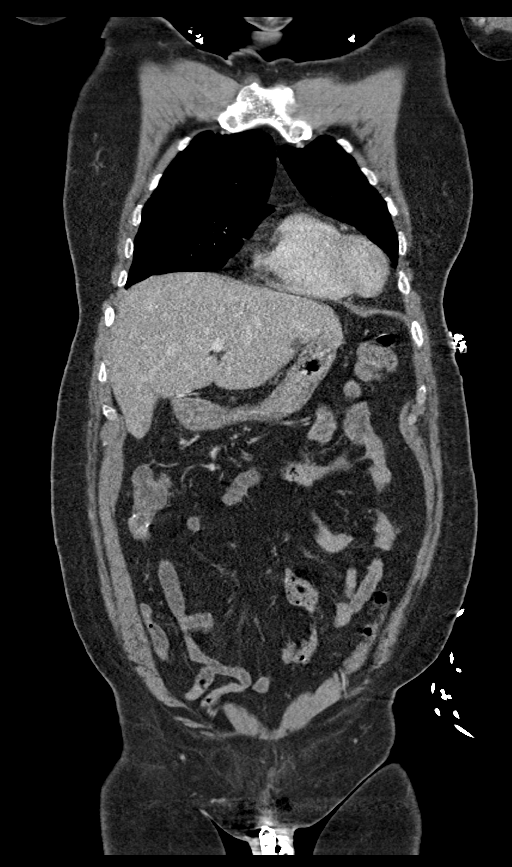
[im 65/147  soft-tissue]
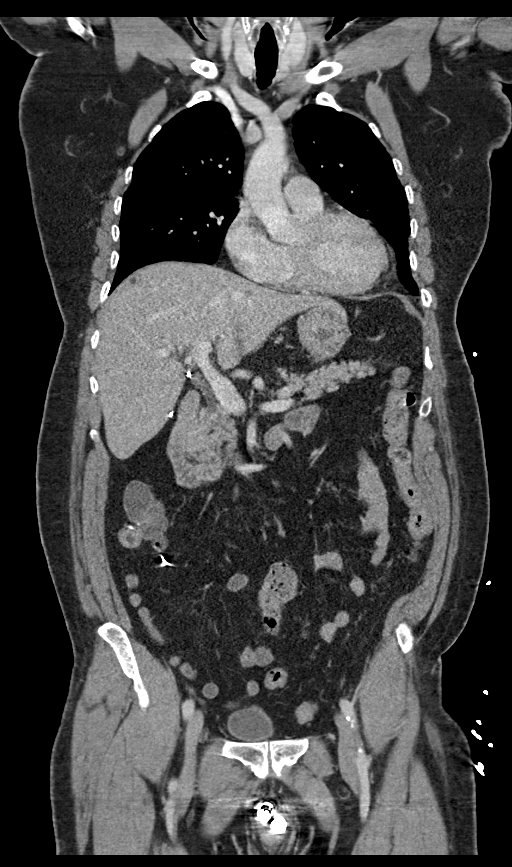
[im 82/147  soft-tissue]
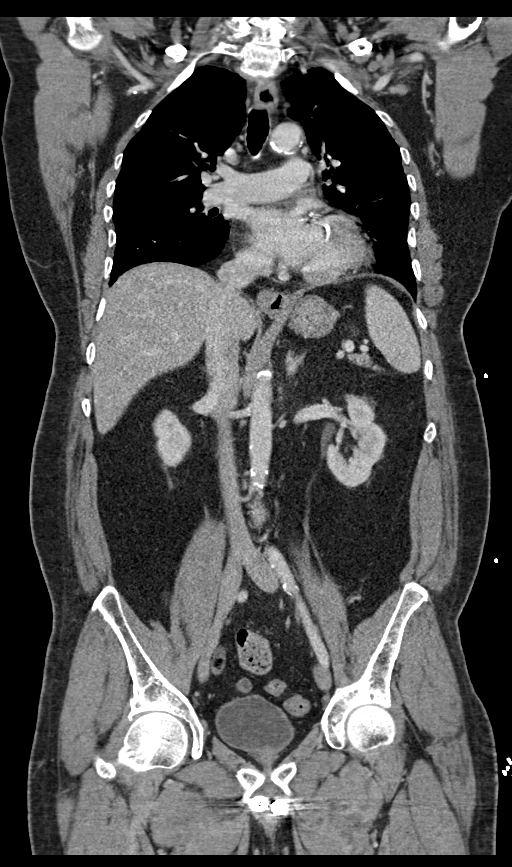

[14 of 46 positions shown; findings below may reference images not displayed]

FINDINGS: CT CHEST FINDINGS

Cardiovascular: Atherosclerosis of thoracic aorta is noted without
aneurysm or dissection. Normal cardiac size. No pericardial effusion
is noted.

Mediastinum/Nodes: No enlarged mediastinal, hilar, or axillary lymph
nodes. Thyroid gland, trachea, and esophagus demonstrate no
significant findings.

Lungs/Pleura: No pneumothorax or pleural effusion is noted. Left
lower lobe airspace opacity is noted consistent with pneumonia. 5 mm
subpleural nodule is noted in right middle lobe best seen on image
number 79 of series 4.

Musculoskeletal: No chest wall mass or suspicious bone lesions
identified.

CT ABDOMEN PELVIS FINDINGS

Hepatobiliary: Status post cholecystectomy. No biliary dilatation is
noted. Stable hepatic cysts are noted.

Pancreas: Unremarkable. No pancreatic ductal dilatation or
surrounding inflammatory changes.

Spleen: Normal in size without focal abnormality.

Adrenals/Urinary Tract: Adrenal glands are unremarkable. Kidneys are
normal, without renal calculi, focal lesion, or hydronephrosis.
Bladder is unremarkable.

Stomach/Bowel: The stomach appears normal. Postsurgical changes are
seen involving the right colon. No evidence of bowel obstruction or
inflammation is noted.

Vascular/Lymphatic: Aortic atherosclerosis. No enlarged abdominal or
pelvic lymph nodes.

Reproductive: Prostate is unremarkable.

Other: No abdominal wall hernia or abnormality. No abdominopelvic
ascites.

Musculoskeletal: No acute or significant osseous findings.
IMPRESSION: Left lower lobe airspace opacity is noted consistent with pneumonia.

5 mm right middle lobe nodule is noted. No follow-up needed if
patient is low-risk. Non-contrast chest CT can be considered in 12
months if patient is high-risk. This recommendation follows the
consensus statement: Guidelines for Management of Incidental
Pulmonary Nodules Detected on CT Images: From the [HOSPITAL]

Multiple hepatic cysts are noted. No acute abnormality is noted in
the abdomen or pelvis.

Aortic Atherosclerosis ([X7]-[X7]).

## 2018-11-19 MED ORDER — IOHEXOL 300 MG/ML  SOLN
100.0000 mL | Freq: Once | INTRAMUSCULAR | Status: AC | PRN
  Administered 2018-11-19: 100 mL via INTRAVENOUS

## 2018-11-19 MED ORDER — SODIUM CHLORIDE (PF) 0.9 % IJ SOLN
INTRAMUSCULAR | Status: AC
  Filled 2018-11-19: qty 50

## 2018-11-19 NOTE — Patient Instructions (Addendum)
[facility] today for further eval and likely covid19 testing   Return in about 4 weeks (around 12/17/2018), or if symptoms worsen or fail to improve, for Follow up with Elisha HeadlandBrian Dainel Arcidiacono FNP-C.   Coronavirus (COVID-19) Are you at risk?  Are you at risk for the Coronavirus (COVID-19)?  To be considered HIGH RISK for Coronavirus (COVID-19), you have to meet the following criteria:  . Traveled to [place], [place], [place], [place] or [place]; or in the [place] to [place], [place], [place], or [place]; and have fever, cough, and shortness of breath within the last 2 weeks of travel OR . Been in close contact with a person diagnosed with COVID-19 within the last 2 weeks and have fever, cough, and shortness of breath . IF YOU DO NOT MEET THESE CRITERIA, YOU ARE CONSIDERED LOW RISK FOR COVID-19.  What to do if you are HIGH RISK for COVID-19?  Marland Kitchen. If you are having a medical emergency, call 911. . Seek medical care right away. Before you go to a doctor's office, urgent care or emergency department, call ahead and tell them about your recent travel, contact with someone diagnosed with COVID-19, and your symptoms. You should receive instructions from your physician's office regarding next steps of care.  . When you arrive at healthcare provider, tell the healthcare staff immediately you have returned from visiting [place], [place], [place], [place] or [place]; or traveled in the [place] to [place], [place], [place], or [place]; in the last two weeks or you have been in close contact with a person diagnosed with COVID-19 in the last 2 weeks.   . Tell the health care staff about your symptoms: fever, cough and shortness of breath. . After you have been seen by a medical provider, you will be either: o Tested for (COVID-19) and discharged home on quarantine except to seek medical care if symptoms worsen, and asked to  - Stay home and avoid contact with others until you get your results  (4-5 days)  - Avoid travel on public transportation if possible (such as bus, train, or airplane) or o Sent to the Emergency Department by EMS for evaluation, COVID-19 testing, and possible admission depending on your condition and test results.  What to do if you are LOW RISK for COVID-19?  Reduce your risk of any infection by using the same precautions used for avoiding the common cold or flu:  Marland Kitchen. Wash your hands often with soap and warm water for at least 20 seconds.  If soap and water are not readily available, use an alcohol-based hand sanitizer with at least 60% alcohol.  . If coughing or sneezing, cover your mouth and nose by coughing or sneezing into the elbow areas of your shirt or coat, into a tissue or into your sleeve (not your hands). . Avoid shaking hands with others and consider head nods or verbal greetings only. . Avoid touching your eyes, nose, or mouth with unwashed hands.  . Avoid close contact with people who are sick. . Avoid places or events with large numbers of people in one location, like concerts or sporting events. . Carefully consider travel plans you have or are making. . If you are planning any travel outside or inside the [place], visit the CDC's Travelers' Health webpage for the latest health notices. . If you have some symptoms but not all symptoms, continue to monitor at home and seek medical attention if your symptoms worsen. . If  you are having a medical emergency, call 911.   ADDITIONAL HEALTHCARE OPTIONS FOR PATIENTS  [facility] / e-Visit: [email or web address]         [facility]: [phone]  [facility]: [phone]                   [facility]: [phone]           It is flu season:   >>> Best ways to protect herself from the flu: Receive the yearly flu vaccine, practice good hand hygiene washing with soap and also using hand sanitizer when  available, eat a nutritious meals, get adequate rest, hydrate appropriately   Please contact the office if your symptoms worsen or you have concerns that you are not improving.   Thank you for choosing [facility] for your healthcare, and for allowing [place] to partner with you on your healthcare journey. I am thankful to be able to provide care to you today.   Elisha Headland FNP-C

## 2018-11-19 NOTE — ED Notes (Signed)
Patient transported to CT 

## 2018-11-19 NOTE — Assessment & Plan Note (Signed)
Assessment: Patient with known bipolar disorder Patient with caregiver who operates a motor vehicle and drives patient here Caregiver unwilling to transport patient to the emergency room for further evaluation, due to concern for COVID-19 Patient needs further assessment as well as likely social work assistance  Plan: Present to the emergency room today for further evaluation Patient will be routed via EMS

## 2018-11-19 NOTE — Assessment & Plan Note (Signed)
Assessment: Chad Moss use 4 times daily Nebs use 4 times daily Spiriva HandiHaler use 3 times daily mMRC 3 today Current everyday smoker Lungs clear to auscultation today  Plan: Spiriva HandiHaler daily Can use albuterol nebs as needed Present to the emergency room for further evaluation of your shortness of breath as well as diarrhea Close follow-up with our office Likely needs repeat pulmonary function testing when COVID-19 restrictions are lifted Smoking cessation emphasized today

## 2018-11-19 NOTE — ED Notes (Signed)
Pt given and verbalized understanding of d/c instructions and need for self quarantine for the next 2 weeks or until he feels better .Caregiver was also informed of risk and need to self isolate. Pt told to return if s/s worsen. No further distress or questions at time of ambulation out of department.

## 2018-11-19 NOTE — ED Triage Notes (Signed)
Patient arrived by EMS from doctors office. Doctors office states patient is SOB and Tachycardic. Pt c/o Weakness. Pt has had symptoms for 10 days.  Pt came for COVID-19 test.   Pt has diarrhea for the past few days.   Hx of Chron's Disease.   EMS VS BP 141/88, HR 108, RR 16, SpO2 95% on RA.

## 2018-11-19 NOTE — ED Provider Notes (Signed)
[facility] DEPT Provider Note   CSN: [id number] Arrival date & time: 11/19/18  1457    History   Chief Complaint Chief Complaint  Patient presents with   Abdominal Pain    HPI Chad Moss is a 63 y.o. male.      Abdominal Pain  Associated symptoms: diarrhea and nausea   Associated symptoms: no vomiting    Patient presents from the pulmonology clinic due to concern of tachycardia, fever. Patient complains mostly of nausea, abdominal pain, diarrhea. He does note some generalized discomfort, warm sensation, though is unaware of a fever. Today, with his concerns he went to his clinic, was sent here due to the aforementioned abnormal vital signs. He denies confusion, disorientation, substantially of dyspnea, has a history of COPD, however. No recent medication change, diet change, activity change. Past Medical History:  Diagnosis Date   Allergic rhinitis    Anxiety    Atelectasis    CAD (coronary artery disease), native coronary artery    9/18 PCI/DES x1 to mLCx, mild diffuse nonobstructive disease, EF 55% on Lv gram   Chronic bronchitis (HCC)    COPD (chronic obstructive pulmonary disease) (HCC)    Crohn's disease (HCC)    Depression    DVT (deep venous thrombosis) (HCC)    RLE X 2   Dysphagia    Dyspnea    Enlarged prostate    GERD (gastroesophageal reflux disease)    Glaucoma, both eyes    Heart murmur    History of stomach ulcers 1980s   Hyperlipidemia    Hypertension    "off RX for years now cause of coughing w/Lisinopril" (03/13/2017)   IBS (irritable bowel syndrome)    Paranoid schizophrenia ([place])    Peripheral vascular disease ([place])    Pneumonia 1990s   Pre-diabetes    "one time" (03/13/2017)   Stroke ([place]) 04/2016   "eye stroke; left eye" (03/13/2017)   Syncopal episodes     Patient Active Problem List   Diagnosis Date Noted   Diarrhea 11/19/2018   Fever in adult 11/19/2018    Inadequate community support 11/19/2018   Edema 03/09/2018   Abnormal weight gain 03/09/2018   Essential hypertension    Coronary artery disease involving native coronary artery of native heart with angina pectoris ([place]) 03/13/2017   Coronary artery calcification seen on CAT scan 01/12/2017   COPD mixed type ([place]) 09/13/2015   Cough 08/09/2015   Flu-like symptoms 08/09/2015   Acute sinusitis 08/09/2015   AP (abdominal pain) 07/09/2014   COLD (chronic obstructive lung disease) ([place]) 07/09/2014   COPD exacerbation ([place]) 06/01/2014   Other malaise and fatigue 09/06/2013   Encounter for screening for lung cancer 10/09/2012   Chronic cough 11/19/2011   Tobacco abuse 04/06/2011   Mixed hyperlipidemia 01/27/2009   HYPERTENSION 01/27/2009   PERIPHERAL VASCULAR DISEASE 01/27/2009   Allergic rhinitis 01/27/2009   Other emphysema ([place]) 01/27/2009   DIZZINESS, CHRONIC 01/27/2009   SHORTNESS OF BREATH (SOB) 01/27/2009   Crohn's disease without complication ([place]) 69/62/9528    Past Surgical History:  Procedure Laterality Date   [place]; ?1989; 1996   "part of my ileum removed"   CORONARY ANGIOPLASTY WITH STENT PLACEMENT  03/13/2017   CORONARY STENT INTERVENTION N/A 03/13/2017   Procedure: CORONARY STENT INTERVENTION;  Surgeon: Jettie Booze, MD;  Location: [facility];  Service: Cardiovascular;  Laterality: N/A;   EYE SURGERY Right    "laser;  for glaucoma" (03/13/2017)   FASCIOTOMY Right    HERNIA REPAIR     LAPAROSCOPIC CHOLECYSTECTOMY     LEFT HEART CATH AND CORONARY ANGIOGRAPHY N/A 03/13/2017   Procedure: LEFT HEART CATH AND CORONARY ANGIOGRAPHY;  Surgeon: Jettie Booze, MD;  Location: [facility];  Service: Cardiovascular;  Laterality: N/A;   PENILE PROSTHESIS IMPLANT  01/2011   Archie Endo 02/01/2011   PERIPHERAL VASCULAR CATHETERIZATION Right 1999   "had blood clots in my leg; went in  to open them up; dye went into leg; ended up having a fasiotomy"   TONSILLECTOMY          Home Medications    Prior to Admission medications   Medication Sig Start Date End Date Taking? Authorizing Provider  albuterol (PROAIR HFA) 108 (90 Base) MCG/ACT inhaler INHALE 2 PUFFS BY MOUTH INTO THE LUNGS EVERY 6 HOURS AS NEEDED FOR WHEEZING OR SHORTNESS OF BREATH 07/18/18   Brand Males, MD  albuterol (PROVENTIL) (2.5 MG/3ML) 0.083% nebulizer solution Take 3 mLs (2.5 mg total) by nebulization every 6 (six) hours as needed for wheezing or shortness of breath. 07/18/18   Brand Males, MD  alfuzosin (UROXATRAL) 10 MG 24 hr tablet Take 10 mg by mouth at bedtime.     [provider]  amLODipine (NORVASC) 5 MG tablet TAKE 1 TABLET(5 MG) BY MOUTH DAILY 09/03/18   Jettie Booze, MD  aspirin EC 81 MG tablet Take 81 mg by mouth daily.    [provider]  bimatoprost (LUMIGAN) 0.01 % SOLN Place 1 drop into the left eye at bedtime.    [provider]  brimonidine (ALPHAGAN) 0.15 % ophthalmic solution Place 1 drop into the left eye 2 (two) times daily.     [provider]  cholestyramine Lucrezia Starch) 4 G packet Take 1 packet by mouth daily.     [provider]  clonazePAM (KLONOPIN) 1 MG tablet Take 1 mg by mouth at bedtime. Anxiety    [provider]  clopidogrel (PLAVIX) 75 MG tablet TAKE 1 TABLET(75 MG) BY MOUTH DAILY WITH BREAKFAST 09/30/18   Jettie Booze, MD  dicyclomine (BENTYL) 10 MG capsule Take 10 mg by mouth 4 (four) times daily -  before meals and at bedtime.     [provider]  diphenhydrAMINE (BENADRYL) 25 MG tablet Take 50 mg by mouth at bedtime.     [provider]  fesoterodine (TOVIAZ) 4 MG TB24 tablet Take 4 mg by mouth daily.    [provider]  finasteride (PROSCAR) 5 MG tablet Take 5 mg by mouth daily.    [provider]  furosemide (LASIX) 40 MG tablet Take 1 tablet (40 mg total)  by mouth daily. 05/23/18   Jettie Booze, MD  isosorbide mononitrate (IMDUR) 30 MG 24 hr tablet TAKE 1 TABLET(30 MG) BY MOUTH DAILY 08/02/18   Jettie Booze, MD  loxapine (LOXITANE) 10 MG capsule Take 10 mg by mouth 2 (two) times daily. 1 tab in AM and 2 tabs in PM    [provider]  mercaptopurine (PURINETHOL) 50 MG tablet Take 75 mg by mouth daily. 1 tablet and a half tablet Give on an empty stomach 1 hour before or 2 hours after meals. Caution: Chemotherapy.    [provider]  mesalamine (PENTASA) 250 MG CR capsule Take 1,000 mg by mouth 4 (four) times daily.     [provider]  methocarbamol (ROBAXIN) 500 MG tablet Take 1 tablet (500  mg total) by mouth 2 (two) times daily. 05/29/17   Shary Decamp, PA-C  mirtazapine (REMERON) 30 MG tablet Take 30 mg by mouth at bedtime.  01/08/17   [provider]  nitroGLYCERIN (NITROSTAT) 0.4 MG SL tablet Place 1 tablet (0.4 mg total) under the tongue every 5 (five) minutes x 3 doses as needed for chest pain. 09/20/17   Jettie Booze, MD  pantoprazole (PROTONIX) 40 MG tablet TAKE 1 TABLET(40 MG) BY MOUTH TWICE DAILY 10/04/18   Jettie Booze, MD  potassium chloride (K-DUR) 10 MEQ tablet Take 1 tablet (10 mEq total) by mouth 2 (two) times daily. 08/01/17   Jettie Booze, MD  rosuvastatin (CRESTOR) 20 MG tablet TAKE 1 TABLET(20 MG) BY MOUTH DAILY 08/09/18   Jettie Booze, MD  timolol (TIMOPTIC) 0.5 % ophthalmic solution Place 1 drop into the left eye 2 (two) times daily. 07/21/16   [provider]  tiotropium (SPIRIVA HANDIHALER) 18 MCG inhalation capsule INHALE THE CONTENT OF 1 CAPSULE VIA HANDIHALER DAILY 07/18/18   Brand Males, MD  traZODone (DESYREL) 50 MG tablet Take 150 mg by mouth at bedtime.    [provider]    Family History Family History  Problem Relation Age of Onset   Hypertension Mother    Heart disease Mother    Hypertension Father    Heart  disease Father    Lung cancer Brother        2006    Social History Social History   Tobacco Use   Smoking status: Current Every Day Smoker    Packs/day: 1.50    Years: 50.00    Pack years: 75.00    Types: Cigarettes    Start date: 11/18/1968   Smokeless tobacco: Never Used   Tobacco comment: Patient given smoking cessation information  Substance Use Topics   Alcohol use: No    Alcohol/week: 0.0 standard drinks   Drug use: No     Allergies   Benztropine; Codeine; Meperidine; Cyclobenzaprine; Penicillins; Sulfamethoxazole-trimethoprim; and Sulfonamide derivatives   Review of Systems Review of Systems  Constitutional:       Per HPI, otherwise negative  HENT:       Per HPI, otherwise negative  Respiratory:       Per HPI, otherwise negative  Cardiovascular:       Per HPI, otherwise negative  Gastrointestinal: Positive for abdominal pain, diarrhea and nausea. Negative for vomiting.  Endocrine:       Negative aside from HPI  Genitourinary:       Neg aside from HPI   Musculoskeletal:       Per HPI, otherwise negative  Skin: Negative.   Neurological: Negative for syncope.     Physical Exam Updated Vital Signs BP (!) 157/79 (BP Location: Left Arm)    Pulse 98    Temp 100.3 F (37.9 C) (Oral)    Resp 19    Ht 5' 11"  (1.803 m)    Wt 97.1 kg    SpO2 94%    BMI 29.85 kg/m   Physical Exam Vitals signs and nursing note reviewed.  Constitutional:      General: He is not in acute distress.    Appearance: He is well-developed.  HENT:     Head: Normocephalic and atraumatic.  Eyes:     Conjunctiva/sclera: Conjunctivae normal.  Cardiovascular:     Rate and Rhythm: Normal rate and regular rhythm.  Pulmonary:     Effort: Pulmonary effort is normal. No respiratory  distress.     Breath sounds: No stridor.  Abdominal:     General: There is no distension.     Tenderness: There is generalized abdominal tenderness. There is no guarding or rebound.  Skin:    General:  Skin is warm and dry.  Neurological:     Mental Status: He is alert and oriented to person, place, and time.      ED Treatments / Results  Labs (all labs ordered are listed, but only abnormal results are displayed) Labs Reviewed  SARS CORONAVIRUS 2 (HOSPITAL ORDER, [facility]) - Abnormal; Notable for the following components:      Result Value   SARS Coronavirus 2 POSITIVE (*)    All other components within normal limits  COMPREHENSIVE METABOLIC PANEL - Abnormal; Notable for the following components:   Potassium 3.1 (*)    BUN 6 (*)    Calcium 8.1 (*)    Albumin 3.3 (*)    AST 54 (*)    ALT 60 (*)    All other components within normal limits  CBC WITH DIFFERENTIAL/PLATELET - Abnormal; Notable for the following components:   WBC 3.5 (*)    RBC 3.66 (*)    Hemoglobin 11.6 (*)    HCT 33.3 (*)    RDW 15.6 (*)    Lymphs Abs 0.5 (*)    All other components within normal limits  URINALYSIS, ROUTINE W REFLEX MICROSCOPIC - Abnormal; Notable for the following components:   Hgb urine dipstick SMALL (*)    Protein, ur 30 (*)    All other components within normal limits  BRAIN NATRIURETIC PEPTIDE    EKG EKG Interpretation  Date/Time:  Tuesday November 19 2018 15:23:56 EDT Ventricular Rate:  103 PR Interval:    QRS Duration: 91 QT Interval:  360 QTC Calculation: 472 R Axis:   -36 Text Interpretation:  Sinus tachycardia Left axis deviation Abnormal ECG Confirmed by Carmin Muskrat [phone]) on 11/19/2018 5:08:37 PM   Radiology Ct Chest W Contrast  Result Date: 11/19/2018 CLINICAL DATA:  Productive cough, shortness of breath. Weight loss, diarrhea. EXAM: CT CHEST, ABDOMEN, AND PELVIS WITH CONTRAST TECHNIQUE: Multidetector CT imaging of the chest, abdomen and pelvis was performed following the standard protocol during bolus administration of intravenous contrast. CONTRAST:  134m OMNIPAQUE IOHEXOL 300 MG/ML  SOLN COMPARISON:  CT scan of October 03, 2017 and February 03, 2014. FINDINGS: CT CHEST FINDINGS Cardiovascular: Atherosclerosis of thoracic aorta is noted without aneurysm or dissection. Normal cardiac size. No pericardial effusion is noted. Mediastinum/Nodes: No enlarged mediastinal, hilar, or axillary lymph nodes. Thyroid gland, trachea, and esophagus demonstrate no significant findings. Lungs/Pleura: No pneumothorax or pleural effusion is noted. Left lower lobe airspace opacity is noted consistent with pneumonia. 5 mm subpleural nodule is noted in right middle lobe best seen on image number 79 of series 4. Musculoskeletal: No chest wall mass or suspicious bone lesions identified. CT ABDOMEN PELVIS FINDINGS Hepatobiliary: Status post cholecystectomy. No biliary dilatation is noted. Stable hepatic cysts are noted. Pancreas: Unremarkable. No pancreatic ductal dilatation or surrounding inflammatory changes. Spleen: Normal in size without focal abnormality. Adrenals/Urinary Tract: Adrenal glands are unremarkable. Kidneys are normal, without renal calculi, focal lesion, or hydronephrosis. Bladder is unremarkable. Stomach/Bowel: The stomach appears normal. Postsurgical changes are seen involving the right colon. No evidence of bowel obstruction or inflammation is noted. Vascular/Lymphatic: Aortic atherosclerosis. No enlarged abdominal or pelvic lymph nodes. Reproductive: Prostate is unremarkable. Other: No abdominal wall hernia or abnormality.  No abdominopelvic ascites. Musculoskeletal: No acute or significant osseous findings. IMPRESSION: Left lower lobe airspace opacity is noted consistent with pneumonia. 5 mm right middle lobe nodule is noted. No follow-up needed if patient is low-risk. Non-contrast chest CT can be considered in 12 months if patient is high-risk. This recommendation follows the consensus statement: Guidelines for Management of Incidental Pulmonary Nodules Detected on CT Images: From the Fleischner Society 2017; Radiology 2017; 284:228-243. Multiple hepatic  cysts are noted. No acute abnormality is noted in the abdomen or pelvis. Aortic Atherosclerosis (ICD10-I70.0). Electronically Signed   By: Marijo Conception M.D.   On: 11/19/2018 19:29   Ct Abdomen Pelvis W Contrast  Result Date: 11/19/2018 CLINICAL DATA:  Productive cough, shortness of breath. Weight loss, diarrhea. EXAM: CT CHEST, ABDOMEN, AND PELVIS WITH CONTRAST TECHNIQUE: Multidetector CT imaging of the chest, abdomen and pelvis was performed following the standard protocol during bolus administration of intravenous contrast. CONTRAST:  142m OMNIPAQUE IOHEXOL 300 MG/ML  SOLN COMPARISON:  CT scan of October 03, 2017 and February 03, 2014. FINDINGS: CT CHEST FINDINGS Cardiovascular: Atherosclerosis of thoracic aorta is noted without aneurysm or dissection. Normal cardiac size. No pericardial effusion is noted. Mediastinum/Nodes: No enlarged mediastinal, hilar, or axillary lymph nodes. Thyroid gland, trachea, and esophagus demonstrate no significant findings. Lungs/Pleura: No pneumothorax or pleural effusion is noted. Left lower lobe airspace opacity is noted consistent with pneumonia. 5 mm subpleural nodule is noted in right middle lobe best seen on image number 79 of series 4. Musculoskeletal: No chest wall mass or suspicious bone lesions identified. CT ABDOMEN PELVIS FINDINGS Hepatobiliary: Status post cholecystectomy. No biliary dilatation is noted. Stable hepatic cysts are noted. Pancreas: Unremarkable. No pancreatic ductal dilatation or surrounding inflammatory changes. Spleen: Normal in size without focal abnormality. Adrenals/Urinary Tract: Adrenal glands are unremarkable. Kidneys are normal, without renal calculi, focal lesion, or hydronephrosis. Bladder is unremarkable. Stomach/Bowel: The stomach appears normal. Postsurgical changes are seen involving the right colon. No evidence of bowel obstruction or inflammation is noted. Vascular/Lymphatic: Aortic atherosclerosis. No enlarged abdominal or pelvic lymph  nodes. Reproductive: Prostate is unremarkable. Other: No abdominal wall hernia or abnormality. No abdominopelvic ascites. Musculoskeletal: No acute or significant osseous findings. IMPRESSION: Left lower lobe airspace opacity is noted consistent with pneumonia. 5 mm right middle lobe nodule is noted. No follow-up needed if patient is low-risk. Non-contrast chest CT can be considered in 12 months if patient is high-risk. This recommendation follows the consensus statement: Guidelines for Management of Incidental Pulmonary Nodules Detected on CT Images: From the Fleischner Society 2017; Radiology 2017; 284:228-243. Multiple hepatic cysts are noted. No acute abnormality is noted in the abdomen or pelvis. Aortic Atherosclerosis (ICD10-I70.0). Electronically Signed   By: JMarijo ConceptionM.D.   On: 11/19/2018 19:29   Dg Chest Port 1 View  Result Date: 11/19/2018 CLINICAL DATA:  Short of breath. EXAM: PORTABLE CHEST 1 VIEW COMPARISON:  10/03/2017. FINDINGS: The heart size and mediastinal contours are within normal limits. Decreased lung volumes. Both lungs are clear. The visualized skeletal structures are unremarkable. IMPRESSION: 1. Low lung volumes. 2. No acute findings. Electronically Signed   By: TKerby MoorsM.D.   On: 11/19/2018 16:14    Procedures Procedures (including critical care time)  Medications Ordered in ED Medications  sodium chloride (PF) 0.9 % injection (has no administration in time range)  iohexol (OMNIPAQUE) 300 MG/ML solution 100 mL (100 mLs Intravenous Contrast Given 11/19/18 1902)     Initial Impression / Assessment and Plan /  ED Course  I have reviewed the triage vital signs and the nursing notes.  Pertinent labs & imaging results that were available during my care of the patient were reviewed by me and considered in my medical decision making (see chart for details).        8:23 PM Patient awake, alert, no hypoxia, minimal fever, no distress, no increased work of  breathing. We lengthy conversation about all findings, including abnormal chest CT consistent with pneumonia, positive COVID test. Absent alarming vital sign abnormalities, no increased work of breathing, no hypoxia, no need for supplemental oxygen, the patient is appropriate for home therapy. Patient notes that he has a caretaker who lives with him, she will be made well aware of the findings, as well.  Chad Moss was evaluated in Emergency Department on 11/19/2018 for the symptoms described in the history of present illness. He was evaluated in the context of the global COVID-19 pandemic, which necessitated consideration that the patient might be at risk for infection with the SARS-CoV-2 virus that causes COVID-19. Institutional protocols and algorithms that pertain to the evaluation of patients at risk for COVID-19 are in a state of rapid change based on information released by regulatory bodies including the CDC and federal and state organizations. These policies and algorithms were followed during the patient's care in the ED.   Final Clinical Impressions(s) / ED Diagnoses   Final diagnoses:  Pneumonia due to COVID-19 virus     Carmin Muskrat, MD 11/19/18 2023

## 2018-11-19 NOTE — ED Notes (Signed)
Coming from [facility], states patient has a productive cough, recent 60 lb weight loss, fever-sending for Covid work up

## 2018-11-19 NOTE — Telephone Encounter (Signed)
Noted by triage, and thank you Will sign off

## 2018-11-19 NOTE — Telephone Encounter (Signed)
Per Merrilee Seashore, make apt with NP for today. Called and spoke with [place]. Apt made for today 06/02 at 2:30 with Tammy P. Pt has been screened for COVID-19 over the phone and answered "no" to all questions.

## 2018-11-19 NOTE — Assessment & Plan Note (Signed)
Assessment: Current every day smoker 1.5 packs/day  Plan: We will further assess at next follow-up in 4 weeks We will gladly work with the patient on smoking cessation

## 2018-11-19 NOTE — Assessment & Plan Note (Signed)
Assessment: 10 days of worsening diarrhea Weight is down at least 8 pounds if not 16 pounds per patient Patient reports that he has had 4 episodes of diarrhea for the last 24 hours He denies blood in his stool  Plan: This could be related to his known chronic Crohn's disease Patient is a poor historian and this is difficult to fully manage. With patient's known smoking history, Crohn's disease as well as abnormal weight loss present to an emergency room for further evaluation

## 2018-11-19 NOTE — Progress Notes (Signed)
 @Patient  ID: Chad Moss, male    DOB: 10-05-1955, 63 y.o.   MRN: [id number]  Chief Complaint  Patient presents with  . Acute Visit    Short of breath / Fever / Productive Cough     Referring provider: Arnette FeltsMoore, Janece, FNP  HPI:  63 year old male current every day smoker followed in our office for COPD  PMH: Hyperlipidemia, hypertension, CAD, paranoid schizophrenic Smoker/ Smoking History: Current Everyday Smoker. 47 pack year smoker.  Maintenance: Spiriva HandiHaler Pt of: Dr. Marchelle Gearingamaswamy  11/19/2018  - Visit   63 year old male current every day smoker presenting to our office today as an acute visit.  Patient reporting that he has had 10 days of worsening cough and shortness of breath.  Patient has had a worsened productive purulent cough with green sputum.  Patient also has had 10 days worth of diarrhea that continues to worsen.  Patient has had 4 bouts of diarrhea so far in the last 24 hours.  Initially when prescreening prior to office visit patient reported that he was not short of breath and was afebrile.  On arrival to our office today the patient is afebrile and short of breath.  Patient reporting that he has been using his nebulizer 4 times daily, his rescue inhaler 4 times daily as well as his Spiriva HandiHaler 3 times daily.    Patient also reporting he has not had an appetite and his weight is down 16 pounds over the last 10 days.  Patient reports he has no appetite.  Per chart review when last when compared to last April/2020 weight is down 8 pounds.  Patient is adamant that he has lost 16 pounds in the last 2 weeks.  Patient does have baseline known Crohn's disease per his caregiver.  Patient denies any recent sick contacts.  Patient has not been self quarantining though.  Patient reports that he is gone to grocery stores [employer] as well as other errands around the community.  Patient reports that he does wear a mask when he is doing these items.  Patient continues to smoke.   He smoking 1-1/2 packs/day.  He wants to quit.  MMRC - Breathlessness Score 3 - I stop for breath after walking about 100 yards or after a few minutes on level ground (isle at grocery store is 13300ft)    Tests:   10/03/2017-CT chest lung cancer screening- lung RADS 2, benign appearance, follow-up in 12 months  07/09/2014-pulmonary function test- FVC 3.71 (87% predicted), postbronchodilator ratio 78, postbronchodilator FEV1 2.78 (84% predicted), no bronchodilator response, DLCO 56  FENO:  No results found for: NITRICOXIDE  PFT: PFT Results Latest Ref Rng & Units 07/09/2014  FVC-Pre L 3.71  FVC-Predicted Pre % 87  FVC-Post L 3.57  FVC-Predicted Post % 84  Pre FEV1/FVC % % 74  Post FEV1/FCV % % 78  FEV1-Pre L 2.74  FEV1-Predicted Pre % 83  FEV1-Post L 2.78  DLCO UNC% % 56  DLCO COR %Predicted % 90    Imaging: Dg Chest Port 1 View  Result Date: 11/19/2018 CLINICAL DATA:  Short of breath. EXAM: PORTABLE CHEST 1 VIEW COMPARISON:  10/03/2017. FINDINGS: The heart size and mediastinal contours are within normal limits. Decreased lung volumes. Both lungs are clear. The visualized skeletal structures are unremarkable. IMPRESSION: 1. Low lung volumes. 2. No acute findings. Electronically Signed   By: Signa Kellaylor  Stroud M.D.   On: 11/19/2018 16:14      Specialty Problems      Pulmonary Problems  Allergic rhinitis    Qualifier: Diagnosis of  By: Yancey Flemings CMA, Jennifer        Other emphysema (HCC)           SHORTNESS OF BREATH (SOB)    Qualifier: Diagnosis of  By: Marchelle Gearing MD, Murali        Chronic cough   COPD exacerbation (HCC)   COLD (chronic obstructive lung disease) (HCC)   Acute sinusitis   Cough   COPD mixed type (HCC)      Allergies  Allergen Reactions  . Benztropine Anaphylaxis  . Codeine Anaphylaxis  . Meperidine Swelling  . Cyclobenzaprine Other (See Comments)    Pt. Does not remember  . Penicillins Rash    Has patient had a PCN reaction causing  immediate rash, facial/tongue/throat swelling, SOB or lightheadedness with hypotension: Yes Has patient had a PCN reaction causing severe rash involving mucus membranes or skin necrosis: No Has patient had a PCN reaction that required hospitalization No Has patient had a PCN reaction occurring within the last 10 years: No If all of the above answers are "NO", then may proceed with Cephalosporin use.   . Sulfamethoxazole-Trimethoprim     REACTION: hives  . Sulfonamide Derivatives     REACTION: hives    Immunization History  Administered Date(s) Administered  . Influenza Split 04/06/2011, 03/12/2012, 03/31/2015  . Influenza Whole 05/10/2009  . Influenza,inj,Quad PF,6+ Mos 03/04/2013, 04/02/2017, 03/28/2018  . Influenza-Unspecified 04/19/2014  . Pneumococcal Conjugate-13 01/27/2009  . Pneumococcal Polysaccharide-23 06/19/2008  . Tdap 12/27/2017    Past Medical History:  Diagnosis Date  . Allergic rhinitis   . Anxiety   . Atelectasis   . CAD (coronary artery disease), native coronary artery    9/18 PCI/DES x1 to mLCx, mild diffuse nonobstructive disease, EF 55% on Lv gram  . Chronic bronchitis (HCC)   . COPD (chronic obstructive pulmonary disease) (HCC)   . Crohn's disease (HCC)   . Depression   . DVT (deep venous thrombosis) (HCC)    RLE X 2  . Dysphagia   . Dyspnea   . Enlarged prostate   . GERD (gastroesophageal reflux disease)   . Glaucoma, both eyes   . Heart murmur   . History of stomach ulcers 1980s  . Hyperlipidemia   . Hypertension    "off RX for years now cause of coughing w/Lisinopril" (03/13/2017)  . IBS (irritable bowel syndrome)   . Paranoid schizophrenia (HCC)   . Peripheral vascular disease (HCC)   . Pneumonia 1990s  . Pre-diabetes    "one time" (03/13/2017)  . Stroke Surgery [facility]) 04/2016   "eye stroke; left eye" (03/13/2017)  . Syncopal episodes     Tobacco History: Social History   Tobacco Use  Smoking Status Current Every Day Smoker  . Packs/day:  1.50  . Years: 50.00  . Pack years: 75.00  . Types: Cigarettes  . Start date: 11/18/1968  Smokeless Tobacco Never Used  Tobacco Comment   Patient given smoking cessation information   Ready to quit: Yes Counseling given: Yes Comment: Patient given smoking cessation information   Smoking assessment and cessation counseling  Patient currently smoking: 1.5 ppd  I have advised the patient to quit/stop smoking as soon as possible due to high risk for multiple medical problems.  It will also be very difficult for [place] to manage patient's  respiratory symptoms and status if we continue to expose her lungs to a known irritant.  We do not advise e-cigarettes as a form of stopping  smoking.  Patient is willing to quit smoking. Has not set quit date.   I have advised the patient that we can assist and have options of nicotine replacement therapy, provided smoking cessation education today, provided smoking cessation counseling, and provided cessation resources.  Follow-up next office visit office visit for assessment of smoking cessation.  Smoking cessation counseling advised for: 4 min    Outpatient Encounter Medications as of 11/19/2018  Medication Sig  . albuterol (PROAIR HFA) 108 (90 Base) MCG/ACT inhaler INHALE 2 PUFFS BY MOUTH INTO THE LUNGS EVERY 6 HOURS AS NEEDED FOR WHEEZING OR SHORTNESS OF BREATH  . albuterol (PROVENTIL) (2.5 MG/3ML) 0.083% nebulizer solution Take 3 mLs (2.5 mg total) by nebulization every 6 (six) hours as needed for wheezing or shortness of breath.  . alfuzosin (UROXATRAL) 10 MG 24 hr tablet Take 10 mg by mouth at bedtime.   Marland Kitchen amLODipine (NORVASC) 5 MG tablet TAKE 1 TABLET(5 MG) BY MOUTH DAILY  . aspirin EC 81 MG tablet Take 81 mg by mouth daily.  . bimatoprost (LUMIGAN) 0.01 % SOLN Place 1 drop into the left eye at bedtime.  . brimonidine (ALPHAGAN) 0.15 % ophthalmic solution Place 1 drop into the left eye 2 (two) times daily.   . cholestyramine (QUESTRAN) 4 G packet  Take 1 packet by mouth daily.   . clonazePAM (KLONOPIN) 1 MG tablet Take 1 mg by mouth at bedtime. Anxiety  . clopidogrel (PLAVIX) 75 MG tablet TAKE 1 TABLET(75 MG) BY MOUTH DAILY WITH BREAKFAST  . dicyclomine (BENTYL) 10 MG capsule Take 10 mg by mouth 4 (four) times daily -  before meals and at bedtime.   . diphenhydrAMINE (BENADRYL) 25 MG tablet Take 50 mg by mouth at bedtime.   . fesoterodine (TOVIAZ) 4 MG TB24 tablet Take 4 mg by mouth daily.  . finasteride (PROSCAR) 5 MG tablet Take 5 mg by mouth daily.  . furosemide (LASIX) 40 MG tablet Take 1 tablet (40 mg total) by mouth daily.  . isosorbide mononitrate (IMDUR) 30 MG 24 hr tablet TAKE 1 TABLET(30 MG) BY MOUTH DAILY  . loxapine (LOXITANE) 10 MG capsule Take 10 mg by mouth 2 (two) times daily. 1 tab in AM and 2 tabs in PM  . mercaptopurine (PURINETHOL) 50 MG tablet Take 75 mg by mouth daily. 1 tablet and a half tablet Give on an empty stomach 1 hour before or 2 hours after meals. Caution: Chemotherapy.  . mesalamine (PENTASA) 250 MG CR capsule Take 1,000 mg by mouth 4 (four) times daily.   . methocarbamol (ROBAXIN) 500 MG tablet Take 1 tablet (500 mg total) by mouth 2 (two) times daily.  . mirtazapine (REMERON) 30 MG tablet Take 30 mg by mouth at bedtime.   . nitroGLYCERIN (NITROSTAT) 0.4 MG SL tablet Place 1 tablet (0.4 mg total) under the tongue every 5 (five) minutes x 3 doses as needed for chest pain.  . pantoprazole (PROTONIX) 40 MG tablet TAKE 1 TABLET(40 MG) BY MOUTH TWICE DAILY  . potassium chloride (K-DUR) 10 MEQ tablet Take 1 tablet (10 mEq total) by mouth 2 (two) times daily.  . rosuvastatin (CRESTOR) 20 MG tablet TAKE 1 TABLET(20 MG) BY MOUTH DAILY  . timolol (TIMOPTIC) 0.5 % ophthalmic solution Place 1 drop into the left eye 2 (two) times daily.  Marland Kitchen tiotropium (SPIRIVA HANDIHALER) 18 MCG inhalation capsule INHALE THE CONTENT OF 1 CAPSULE VIA HANDIHALER DAILY  . traZODone (DESYREL) 50 MG tablet Take 150 mg by mouth at bedtime.  No facility-administered encounter medications on file as of 11/19/2018.      Review of Systems  Review of Systems  Constitutional: Positive for appetite change, fatigue, fever and unexpected weight change (16lb weight loss over past 2 weeks ). Negative for activity change and chills.  HENT: Negative for sinus pressure, sinus pain, sneezing and sore throat.   Eyes: Negative.   Respiratory: Positive for cough, chest tightness and shortness of breath. Negative for wheezing.   Cardiovascular: Negative for chest pain, palpitations and leg swelling.  Gastrointestinal: Positive for diarrhea (4 episodes within the last 24 hours, known Crohn's disease per caregiver). Negative for constipation, nausea and vomiting.  Endocrine: Negative.   Musculoskeletal: Negative.   Skin: Negative.   Neurological: Negative for dizziness and headaches.  Psychiatric/Behavioral: Negative.  Negative for dysphoric mood. The patient is not nervous/anxious.   All other systems reviewed and are negative.    Physical Exam  BP 110/68   Pulse (!) 105   Temp (!) 100.9 F (38.3 C) (Oral)   Ht 5\' 11"  (1.803 m)   Wt 214 lb 6.4 oz (97.3 kg)   SpO2 95%   BMI 29.90 kg/m    Repeat temperature is 101.4 oral  Wt Readings from Last 5 Encounters:  11/19/18 214 lb (97.1 kg)  11/19/18 214 lb 6.4 oz (97.3 kg)  10/09/18 222 lb 9.6 oz (101 kg)  07/05/18 224 lb 3.2 oz (101.7 kg)  05/24/18 228 lb (103.4 kg)    Physical Exam  Constitutional: He is oriented to person, place, and time. He appears lethargic. He appears unhealthy. He has a sickly appearance.  Chronically ill adult male  HENT:  Head: Normocephalic and atraumatic.  Right Ear: Hearing, tympanic membrane, external ear and ear canal normal.  Left Ear: Hearing, tympanic membrane, external ear and ear canal normal.  Nose: Nose normal.  Mouth/Throat: Uvula is midline and oropharynx is clear and moist. Abnormal dentition. Dental caries present. No oropharyngeal  exudate.  White patchy areas on tongue, patient has not been rinsing mouth out after using Spiriva HandiHaler, patient has been using Spiriva HandiHaler 3 times the recommended amount  Eyes: Pupils are equal, round, and reactive to light.  Neck: Normal range of motion. Neck supple. No JVD present.  Cardiovascular: Normal rate, regular rhythm and normal heart sounds.  Pulmonary/Chest: Effort normal and breath sounds normal. No accessory muscle usage. No respiratory distress. He has no decreased breath sounds. He has no wheezes. He has no rhonchi. He has no rales.  Abdominal: Soft. He exhibits no distension. Bowel sounds are hyperactive. There is no abdominal tenderness.  Musculoskeletal: Normal range of motion.        General: No edema.  Lymphadenopathy:    He has no cervical adenopathy.  Neurological: He is oriented to person, place, and time. He appears lethargic. Gait normal.  Skin: Skin is warm and dry. He is not diaphoretic. No erythema.  Psychiatric: Mood, memory and judgment normal. He has a flat affect.  Nursing note and vitals reviewed.     Lab Results:  CBC    Component Value Date/Time   WBC 3.5 (L) 11/19/2018 1515   RBC 3.66 (L) 11/19/2018 1515   HGB 11.6 (L) 11/19/2018 1515   HGB 13.3 10/09/2018 1408   HCT 33.3 (L) 11/19/2018 1515   HCT 38.3 10/09/2018 1408   PLT 172 11/19/2018 1515   PLT 206 10/09/2018 1408   MCV 91.0 11/19/2018 1515   MCV 90 10/09/2018 1408   MCH 31.7 11/19/2018  1515   MCHC 34.8 11/19/2018 1515   RDW 15.6 (H) 11/19/2018 1515   RDW 15.4 10/09/2018 1408   LYMPHSABS 0.5 (L) 11/19/2018 1515   LYMPHSABS 1.2 10/09/2018 1408   MONOABS 0.3 11/19/2018 1515   EOSABS 0.0 11/19/2018 1515   EOSABS 0.0 10/09/2018 1408   BASOSABS 0.0 11/19/2018 1515   BASOSABS 0.0 10/09/2018 1408    BMET    Component Value Date/Time   NA 139 11/19/2018 1515   NA 142 10/09/2018 1408   K 3.1 (L) 11/19/2018 1515   CL 102 11/19/2018 1515   CO2 27 11/19/2018 1515    GLUCOSE 97 11/19/2018 1515   BUN 6 (L) 11/19/2018 1515   BUN 6 (L) 10/09/2018 1408   CREATININE 1.01 11/19/2018 1515   CALCIUM 8.1 (L) 11/19/2018 1515   GFRNONAA >60 11/19/2018 1515   GFRAA >60 11/19/2018 1515    BNP    Component Value Date/Time   BNP 8.0 11/19/2018 1515    ProBNP No results found for: PROBNP    Assessment & Plan:   Fever in adult Assessment: Productive cough for the last 10 days Worsening shortness of breath over the last 10 days Diarrhea which is worsened over the last 10 days Febrile today in office  Plan: Recommending patient present to the emergency room for further evaluation Patient likely needs COVID testing Patient needs baseline blood work to further assess diarrhea   Diarrhea Assessment: 10 days of worsening diarrhea Weight is down at least 8 pounds if not 16 pounds per patient Patient reports that he has had 4 episodes of diarrhea for the last 24 hours He denies blood in his stool  Plan: This could be related to his known chronic Crohn's disease Patient is a poor historian and this is difficult to fully manage. With patient's known smoking history, Crohn's disease as well as abnormal weight loss present to an emergency room for further evaluation  Tobacco abuse Assessment: Current every day smoker 1.5 packs/day  Plan: We will further assess at next follow-up in 4 weeks We will gladly work with the patient on smoking cessation   COPD mixed type (HCC) Assessment: Sharolyn Douglas use 4 times daily Nebs use 4 times daily Spiriva HandiHaler use 3 times daily mMRC 3 today Current everyday smoker Lungs clear to auscultation today  Plan: Spiriva HandiHaler daily Can use albuterol nebs as needed Present to the emergency room for further evaluation of your shortness of breath as well as diarrhea Close follow-up with our office Likely needs repeat pulmonary function testing when COVID-19 restrictions are lifted Smoking cessation emphasized  today   Inadequate community support Assessment: Patient with known bipolar disorder Patient with caregiver who operates a motor vehicle and drives patient here Caregiver unwilling to transport patient to the emergency room for further evaluation, due to concern for COVID-19 Patient needs further assessment as well as likely social work assistance  Plan: Present to the emergency room today for further evaluation Patient will be routed via EMS    Return in about 4 weeks (around 12/17/2018).   Coral Ceo, NP 11/19/2018   This appointment was 42 minutes long with over 50% of the time in direct face-to-face patient care, assessment, plan of care, and follow-up.

## 2018-11-19 NOTE — ED Triage Notes (Signed)
Patient does not appear in any respiratory distress as pulmonology office reported-per EMS, patient denies SOB-

## 2018-11-19 NOTE — Discharge Instructions (Addendum)
Please monitor your condition carefully, and do not hesitate to return here for concerning changes.

## 2018-11-19 NOTE — Assessment & Plan Note (Signed)
Assessment: Productive cough for the last 10 days Worsening shortness of breath over the last 10 days Diarrhea which is worsened over the last 10 days Febrile today in office  Plan: Recommending patient present to the emergency room for further evaluation Patient likely needs COVID testing Patient needs baseline blood work to further assess diarrhea

## 2018-11-19 NOTE — ED Notes (Signed)
Bed: WA12 Expected date:  Expected time:  Means of arrival:  Comments: Neg pressure 

## 2018-11-20 ENCOUNTER — Telehealth: Payer: Self-pay | Admitting: Pulmonary Disease

## 2018-11-20 ENCOUNTER — Encounter: Payer: Self-pay | Admitting: Pulmonary Disease

## 2018-11-20 ENCOUNTER — Telehealth: Payer: Self-pay

## 2018-11-20 NOTE — Telephone Encounter (Signed)
Patients is returning phone call.  Patients phone number is [phone]. Scheduled TELEVISIT on 11/25/2018 at 11:30 am w/ Wyn Quaker NP.

## 2018-11-20 NOTE — Telephone Encounter (Signed)
I attempted to call pt in regards to Brian's request and recommendations. Pt did not answer at the time of the call.  I have left pt a message to call our office back.

## 2018-11-20 NOTE — Telephone Encounter (Signed)
Noted  

## 2018-11-20 NOTE — Telephone Encounter (Signed)
Call made to caregiver [place]. She is out doing some shopping at this time and asked that I give him a call back. Will give her a call later this afternoon.

## 2018-11-20 NOTE — Progress Notes (Signed)
Virtual Visit via Video Note   This visit type was conducted due to national recommendations for restrictions regarding the COVID-19 Pandemic (e.g. social distancing) in an effort to limit this patient's exposure and mitigate transmission in our community.  Due to his co-morbid illnesses, this patient is at least at moderate risk for complications without adequate follow up.  This format is felt to be most appropriate for this patient at this time.  All issues noted in this document were discussed and addressed.  A limited physical exam was performed with this format.  Please refer to the patient's chart for his consent to telehealth for [facility].   Date:  11/22/2018   ID:  Chad Moss, DOB [birth year], MRN [id number]  Patient Location: Home Provider Location: Home  PCP:  [name], FNP  Cardiologist:  Larae Grooms, MD  Electrophysiologist:  None   Evaluation Performed:  Follow-Up Visit  Chief Complaint:  CAD  History of Present Illness:    Chad Moss is a 63 y.o. 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 Long h/o tobacco abuse.   Cath on 03/13/17 showed:   The left ventricular systolic function is normal.  LV end diastolic pressure is normal.  The left ventricular ejection fraction is 55-65% by visual estimate.  There is no aortic valve stenosis.  Mid Cx lesion, 75 %stenosed.  A STENT RESOLUTE ONYX [id number] drug eluting stent was successfully placed.  Post intervention, there is a 0% residual stenosis.  Unable to use right radial artery due to high bifurcation of radial and ulnar arteries.  He was advised to stop smoking, but has not been able to stop.  In 2019, he had gained weight and had leg swelling.   The patient does have symptoms concerning for COVID-19 infection (fever, chills, cough, or new shortness of breath).   He felt poorly and tested positive for COVID.  He is  recovering at home.    Denies : Chest pain. Dizziness. Leg edema. Nitroglycerin use. Orthopnea. Palpitations. Paroxysmal nocturnal dyspnea. Shortness of breath. Syncope.   He has been lying down more and edema has reduced.   Past Medical History:  Diagnosis Date  . Allergic rhinitis   . Anxiety   . Atelectasis   . CAD (coronary artery disease), native coronary artery    9/18 PCI/DES x1 to mLCx, mild diffuse nonobstructive disease, EF 55% on Lv gram  . Chronic bronchitis ([place])   . COPD (chronic obstructive pulmonary disease) ([place])   . Crohn's disease ([place])   . Depression   . DVT (deep venous thrombosis) ([facility])    RLE X 2  . Dysphagia   . Dyspnea   . Enlarged prostate   . GERD (gastroesophageal reflux disease)   . Glaucoma, both eyes   . Heart murmur   . History of stomach ulcers 1980s  . Hyperlipidemia   . Hypertension    "off RX for years now cause of coughing w/Lisinopril" (03/13/2017)  . IBS (irritable bowel syndrome)   . Paranoid schizophrenia ([place])   . Peripheral vascular disease ([place])   . Pneumonia 1990s  . Pre-diabetes    "one time" (03/13/2017)  . Stroke [facility]) 04/2016   "eye stroke; left eye" (03/13/2017)  . Syncopal episodes    Past Surgical History:  Procedure Laterality Date  . ABDOMINAL HERNIA REPAIR  1990s  . COLECTOMY  1985; ?1989; 1996   "part of my ileum removed"  . CORONARY  ANGIOPLASTY WITH STENT PLACEMENT  03/13/2017  . CORONARY STENT INTERVENTION N/A 03/13/2017   Procedure: CORONARY STENT INTERVENTION;  Surgeon: Jettie Booze, MD;  Location: [place] [facility];  Service: Cardiovascular;  Laterality: N/A;  . EYE SURGERY Right    "laser; for glaucoma" (03/13/2017)  . FASCIOTOMY Right   . HERNIA REPAIR    . LAPAROSCOPIC CHOLECYSTECTOMY    . LEFT HEART CATH AND CORONARY ANGIOGRAPHY N/A 03/13/2017   Procedure: LEFT HEART CATH AND CORONARY ANGIOGRAPHY;  Surgeon: Jettie Booze, MD;  Location: [place] [facility];  Service: Cardiovascular;   Laterality: N/A;  . PENILE PROSTHESIS IMPLANT  01/2011   Archie Endo 02/01/2011  . PERIPHERAL VASCULAR CATHETERIZATION Right 1999   "had blood clots in my leg; went in to open them up; dye went into leg; ended up having a fasiotomy"  . TONSILLECTOMY       Current Meds  Medication Sig  . albuterol (PROAIR HFA) 108 (90 Base) MCG/ACT inhaler INHALE 2 PUFFS BY MOUTH INTO THE LUNGS EVERY 6 HOURS AS NEEDED FOR WHEEZING OR SHORTNESS OF BREATH  . albuterol (PROVENTIL) (2.5 MG/3ML) 0.083% nebulizer solution Take 3 mLs (2.5 mg total) by nebulization every 6 (six) hours as needed for wheezing or shortness of breath.  . alfuzosin (UROXATRAL) 10 MG 24 hr tablet Take 10 mg by mouth at bedtime.   Marland Kitchen amLODipine (NORVASC) 5 MG tablet TAKE 1 TABLET(5 MG) BY MOUTH DAILY  . aspirin EC 81 MG tablet Take 81 mg by mouth daily.  . bimatoprost (LUMIGAN) 0.01 % SOLN Place 1 drop into the left eye at bedtime.  . brimonidine (ALPHAGAN) 0.15 % ophthalmic solution Place 1 drop into the left eye 2 (two) times daily.   . cholestyramine (QUESTRAN) 4 G packet Take 1 packet by mouth daily.   . clonazePAM (KLONOPIN) 1 MG tablet Take 1 mg by mouth at bedtime. Anxiety  . clopidogrel (PLAVIX) 75 MG tablet TAKE 1 TABLET(75 MG) BY MOUTH DAILY WITH BREAKFAST  . dicyclomine (BENTYL) 10 MG capsule Take 10 mg by mouth 4 (four) times daily -  before meals and at bedtime.   . diphenhydrAMINE (BENADRYL) 25 MG tablet Take 50 mg by mouth at bedtime.   . fesoterodine (TOVIAZ) 4 MG TB24 tablet Take 4 mg by mouth daily.  . finasteride (PROSCAR) 5 MG tablet Take 5 mg by mouth daily.  . furosemide (LASIX) 40 MG tablet Take 1 tablet (40 mg total) by mouth daily.  . isosorbide mononitrate (IMDUR) 30 MG 24 hr tablet TAKE 1 TABLET(30 MG) BY MOUTH DAILY  . loxapine (LOXITANE) 10 MG capsule Take 10 mg by mouth 2 (two) times daily. 1 tab in AM and 2 tabs in PM  . mercaptopurine (PURINETHOL) 50 MG tablet Take 75 mg by mouth daily. 1 tablet and a half tablet  Give on an empty stomach 1 hour before or 2 hours after meals. Caution: Chemotherapy.  . mesalamine (PENTASA) 250 MG CR capsule Take 1,000 mg by mouth 4 (four) times daily.   . methocarbamol (ROBAXIN) 500 MG tablet Take 1 tablet (500 mg total) by mouth 2 (two) times daily.  . mirtazapine (REMERON) 30 MG tablet Take 30 mg by mouth at bedtime.   . nitroGLYCERIN (NITROSTAT) 0.4 MG SL tablet Place 1 tablet (0.4 mg total) under the tongue every 5 (five) minutes x 3 doses as needed for chest pain.  . pantoprazole (PROTONIX) 40 MG tablet TAKE 1 TABLET(40 MG) BY MOUTH TWICE DAILY  . potassium  chloride (K-DUR) 10 MEQ tablet Take 1 tablet (10 mEq total) by mouth 2 (two) times daily.  . rosuvastatin (CRESTOR) 20 MG tablet TAKE 1 TABLET(20 MG) BY MOUTH DAILY  . timolol (TIMOPTIC) 0.5 % ophthalmic solution Place 1 drop into the left eye 2 (two) times daily.  Marland Kitchen tiotropium (SPIRIVA HANDIHALER) 18 MCG inhalation capsule INHALE THE CONTENT OF 1 CAPSULE VIA HANDIHALER DAILY  . traZODone (DESYREL) 50 MG tablet Take 150 mg by mouth at bedtime.     Allergies:   Benztropine; Codeine; Meperidine; Cyclobenzaprine; Penicillins; Sulfamethoxazole-trimethoprim; and Sulfonamide derivatives   Social History   Tobacco Use  . Smoking status: Current Every Day Smoker    Packs/day: 1.50    Years: 50.00    Pack years: 75.00    Types: Cigarettes    Start date: 11/18/1968  . Smokeless tobacco: Never Used  . Tobacco comment: Patient given smoking cessation information  Substance Use Topics  . Alcohol use: No    Alcohol/week: 0.0 standard drinks  . Drug use: No     Family Hx: The patient's family history includes Heart disease in his father and mother; Hypertension in his father and mother; Lung cancer in his brother.  ROS:   Please see the history of present illness.    Fever All other systems reviewed and are negative.   Prior CV studies:   The following studies were reviewed today:  Cath results  Labs/Other  Tests and Data Reviewed:    EKG:  An ECG dated 11/19/2018 was personally reviewed today and demonstrated:  NSR, no ST changes, LAD  Recent Labs: 11/19/2018: ALT 60; B Natriuretic Peptide 8.0; BUN 6; Creatinine, Ser 1.01; Hemoglobin 11.6; Platelets 172; Potassium 3.1; Sodium 139   Recent Lipid Panel Lab Results  Component Value Date/Time   CHOL 106 03/28/2018 11:52 AM   TRIG 132 03/28/2018 11:52 AM   HDL 36 (L) 03/28/2018 11:52 AM   CHOLHDL 2.9 03/28/2018 11:52 AM   LDLCALC 44 03/28/2018 11:52 AM    Wt Readings from Last 3 Encounters:  11/22/18 200 lb (90.7 kg)  11/19/18 214 lb (97.1 kg)  11/19/18 214 lb 6.4 oz (97.3 kg)     Objective:    Vital Signs:  Ht 5' 11"  (1.803 m)   Wt 200 lb (90.7 kg)   BMI 27.89 kg/m    VITAL SIGNS:  reviewed GEN:  no acute distress RESPIRATORY:  normal respiratory effort, symmetric expansion PSYCH:  flat affect exam limited by video format  ASSESSMENT & PLAN:    1. CAD: No angina . COntinue aggressive secondary prevention.  2. Chronic diastolic heart failure: Lasix was given last year.  Decrease frequency of Lasix to 2x/week since he is taking in less with COVID 19.  3. Hyperlipidemia: The current medical regimen is effective;  continue present plan and medications. 4. CVA: Prior CVA.  5. Tobacco abuse: Not smoking since he contracted COVID. 6. HTN: High a few days ago.  No changes until he recovers from [place].   COVID-19 Education: The signs and symptoms of COVID-19 were discussed with the patient and how to seek care for testing (follow up with PCP or arrange E-visit).  The importance of social distancing was discussed today.  Time:   Today, I have spent 15 minutes with the patient with telehealth technology discussing the above problems.     Medication Adjustments/Labs and Tests Ordered: Current medicines are reviewed at length with the patient today.  Concerns regarding medicines are outlined above.  Tests Ordered: No orders of the  defined types were placed in this encounter.   Medication Changes: No orders of the defined types were placed in this encounter.   Disposition:  Follow up in 1 month(s)  Signed, Larae Grooms, MD  11/22/2018 10:36 AM    [place]

## 2018-11-20 NOTE — Telephone Encounter (Signed)
11/20/2018 1103  Can we please contact the patient to get him scheduled for a 3 to 5-day video visit.  Goal would be 11/22/2018 or 11/25/2018.  Ideally this should be a video visit so I could help further assess and monitor the patient.  Patient was recently found positive for SARS-CoV-2.  He was discharged home from the emergency room.  I would like to keep a close eye on him as well as further support him as he is managing this on his own outpatient.  We need to also encourage the patient to contact his primary care provider and let them know that he is positive for SARS-CoV-2.  He should be in full quarantine at this time.  Please review the information below: Please also print this telephone note and mail it to the patient.     Person Under Monitoring Name: Chad Moss [facility]  Location: [address] [place] [address]   Infection Prevention Recommendations for Individuals Confirmed to have, or Being Evaluated for, 2019 Novel Coronavirus (COVID-19) Infection Who Receive Care at Home  Individuals who are confirmed to have, or are being evaluated for, COVID-19 should follow the prevention steps below until a healthcare provider or local or state health department says they can return to normal activities.  Stay home except to get medical care You should restrict activities outside your home, except for getting medical care. Do not go to work, school, or public areas, and do not use public transportation or taxis.  Call ahead before visiting your doctor Before your medical appointment, call the healthcare provider and tell them that you have, or are being evaluated for, COVID-19 infection. This will help the healthcare provider's office take steps to keep other people from getting infected. Ask your healthcare provider to call the local or state health department.  Monitor your symptoms Seek prompt medical attention if your illness is worsening (e.g., difficulty breathing). Before going  to your medical appointment, call the healthcare provider and tell them that you have, or are being evaluated for, COVID-19 infection. Ask your healthcare provider to call the local or state health department.  Wear a facemask You should wear a facemask that covers your nose and mouth when you are in the same room with other people and when you visit a healthcare provider. People who live with or visit you should also wear a facemask while they are in the same room with you.  Separate yourself from other people in your home As much as possible, you should stay in a different room from other people in your home. Also, you should use a separate bathroom, if available.  Avoid sharing household items You should not share dishes, drinking glasses, cups, eating utensils, towels, bedding, or other items with other people in your home. After using these items, you should wash them thoroughly with soap and water.  Cover your coughs and sneezes Cover your mouth and nose with a tissue when you cough or sneeze, or you can cough or sneeze into your sleeve. Throw used tissues in a lined trash can, and immediately wash your hands with soap and water for at least 20 seconds or use an alcohol-based hand rub.  Wash your [employer] your hands often and thoroughly with soap and water for at least 20 seconds. You can use an alcohol-based hand sanitizer if soap and water are not available and if your hands are not visibly dirty. Avoid touching your eyes, nose, and mouth with unwashed hands.  Prevention Steps for Caregivers and Household Members of Individuals Confirmed to have, or Being Evaluated for, COVID-19 Infection Being Cared for in the Home  If you live with, or provide care at home for, a person confirmed to have, or being evaluated for, COVID-19 infection please follow these guidelines to prevent infection:  Follow healthcare provider's instructions Make sure that you understand and can help  the patient follow any healthcare provider instructions for all care.  Provide for the patient's basic needs You should help the patient with basic needs in the home and provide support for getting groceries, prescriptions, and other personal needs.  Monitor the patient's symptoms If they are getting sicker, call his or her medical provider and tell them that the patient has, or is being evaluated for, COVID-19 infection. This will help the healthcare provider's office take steps to keep other people from getting infected. Ask the healthcare provider to call the local or state health department.  Limit the number of people who have contact with the patient  If possible, have only one caregiver for the patient.  Other household members should stay in another home or place of residence. If this is not possible, they should stay  in another room, or be separated from the patient as much as possible. Use a separate bathroom, if available.  Restrict visitors who do not have an essential need to be in the home.  Keep older adults, very young children, and other sick people away from the patient Keep older adults, very young children, and those who have compromised immune systems or chronic health conditions away from the patient. This includes people with chronic heart, lung, or kidney conditions, diabetes, and cancer.  Ensure good ventilation Make sure that shared spaces in the home have good air flow, such as from an air conditioner or an opened window, weather permitting.  Wash your hands often  Wash your hands often and thoroughly with soap and water for at least 20 seconds. You can use an alcohol based hand sanitizer if soap and water are not available and if your hands are not visibly dirty.  Avoid touching your eyes, nose, and mouth with unwashed hands.  Use disposable paper towels to dry your hands. If not available, use dedicated cloth towels and replace them when they become  wet.  Wear a facemask and gloves  Wear a disposable facemask at all times in the room and gloves when you touch or have contact with the patient's blood, body fluids, and/or secretions or excretions, such as sweat, saliva, sputum, nasal mucus, vomit, urine, or feces.  Ensure the mask fits over your nose and mouth tightly, and do not touch it during use.  Throw out disposable facemasks and gloves after using them. Do not reuse.  Wash your hands immediately after removing your facemask and gloves.  If your personal clothing becomes contaminated, carefully remove clothing and launder. Wash your hands after handling contaminated clothing.  Place all used disposable facemasks, gloves, and other waste in a lined container before disposing them with other household waste.  Remove gloves and wash your hands immediately after handling these items.  Do not share dishes, glasses, or other household items with the patient  Avoid sharing household items. You should not share dishes, drinking glasses, cups, eating utensils, towels, bedding, or other items with a patient who is confirmed to have, or being evaluated for, COVID-19 infection.  After the person uses these items, you should wash them thoroughly with soap and  water.  Wash laundry thoroughly  Immediately remove and wash clothes or bedding that have blood, body fluids, and/or secretions or excretions, such as sweat, saliva, sputum, nasal mucus, vomit, urine, or feces, on them.  Wear gloves when handling laundry from the patient.  Read and follow directions on labels of laundry or clothing items and detergent. In general, wash and dry with the warmest temperatures recommended on the label.  Clean all areas the individual has used often  Clean all touchable surfaces, such as counters, tabletops, doorknobs, bathroom fixtures, toilets, phones, keyboards, tablets, and bedside tables, every day. Also, clean any surfaces that may have blood, body  fluids, and/or secretions or excretions on them.  Wear gloves when cleaning surfaces the patient has come in contact with.  Use a diluted bleach solution (e.g., dilute bleach with 1 part bleach and 10 parts water) or a household disinfectant with a label that says EPA-registered for coronaviruses. To make a bleach solution at home, add 1 tablespoon of bleach to 1 quart (4 cups) of water. For a larger supply, add  cup of bleach to 1 gallon (16 cups) of water.  Read labels of cleaning products and follow recommendations provided on product labels. Labels contain instructions for safe and effective use of the cleaning product including precautions you should take when applying the product, such as wearing gloves or eye protection and making sure you have good ventilation during use of the product.  Remove gloves and wash hands immediately after cleaning.  Monitor yourself for signs and symptoms of illness Caregivers and household members are considered close contacts, should monitor their health, and will be asked to limit movement outside of the home to the extent possible. Follow the monitoring steps for close contacts listed on the symptom monitoring form.   ? If you have additional questions, contact your local health department or call the epidemiologist on call at [phone] (available 24/7). ? This guidance is subject to change. For the most up-to-date guidance from [facility], please refer to their website: [email or web address].hu      Elisha Headland, FNP

## 2018-11-20 NOTE — Telephone Encounter (Signed)
Virtual Visit Pre-Appointment Phone Call TELEPHONE CALL NOTE  Chad Moss has been deemed a candidate for a follow-up tele-health visit to limit community exposure during the Covid-19 pandemic. I spoke with the patient via phone to ensure availability of phone/video source, confirm preferred email & phone number, and discuss instructions and expectations.  I reminded Chad Moss to be prepared with any vital sign and/or heart rhythm information that could potentially be obtained via home monitoring, at the time of his visit. I reminded Chad Moss to expect a phone call prior to his visit.  Patient's caretaker Chad Moss agrees to consent below.  [name], RN 11/20/2018 11:46 AM     FULL LENGTH CONSENT FOR TELE-HEALTH VISIT   I hereby voluntarily request, consent and authorize [facility] and its employed or contracted physicians, physician assistants, nurse practitioners or other licensed health care professionals (the Practitioner), to provide me with telemedicine health care services (the "Services") as deemed necessary by the treating Practitioner. I acknowledge and consent to receive the Services by the Practitioner via telemedicine. I understand that the telemedicine visit will involve communicating with the Practitioner through live audiovisual communication technology and the disclosure of certain medical information by electronic transmission. I acknowledge that I have been given the opportunity to request an in-person assessment or other available alternative prior to the telemedicine visit and am voluntarily participating in the telemedicine visit.  I understand that I have the right to withhold or withdraw my consent to the use of telemedicine in the course of my care at any time, without affecting my right to future care or treatment, and that the Practitioner or I may terminate the telemedicine visit at any time. I understand that I have the right to  inspect all information obtained and/or recorded in the course of the telemedicine visit and may receive copies of available information for a reasonable fee.  I understand that some of the potential risks of receiving the Services via telemedicine include:  Marland Kitchen Delay or interruption in medical evaluation due to technological equipment failure or disruption; . Information transmitted may not be sufficient (e.g. poor resolution of images) to allow for appropriate medical decision making by the Practitioner; and/or  . In rare instances, security protocols could fail, causing a breach of personal health information.  Furthermore, I acknowledge that it is my responsibility to provide information about my medical history, conditions and care that is complete and accurate to the best of my ability. I acknowledge that Practitioner's advice, recommendations, and/or decision may be based on factors not within their control, such as incomplete or inaccurate data provided by me or distortions of diagnostic images or specimens that may result from electronic transmissions. I understand that the practice of medicine is not an exact science and that Practitioner makes no warranties or guarantees regarding treatment outcomes. I acknowledge that I will receive a copy of this consent concurrently upon execution via email to the email address I last provided but may also request a printed copy by calling the office of [facility].    I understand that my insurance will be billed for this visit.   I have read or had this consent read to me. . I understand the contents of this consent, which adequately explains the benefits and risks of the Services being provided via telemedicine.  . I have been provided ample opportunity to ask questions regarding this consent and the Services and have had my questions answered to my  satisfaction. . I give my informed consent for the services to be provided through the use of  telemedicine in my medical care  By participating in this telemedicine visit I agree to the above.

## 2018-11-21 ENCOUNTER — Telehealth: Payer: Self-pay

## 2018-11-21 NOTE — Telephone Encounter (Signed)
Called pt to schedule him an appointment. Patient's wife consented to a virtual appointment. YRL,RMA

## 2018-11-21 NOTE — Telephone Encounter (Signed)
Patient caregiver call office again, confirmed appt on 11/25/2018. Aware it is a telephone visit. Confirmed that she was able to get tested. Made aware we mailed some paper work regarding further quarantine instructions. Voiced understanding. Nothing further is needed at this time.

## 2018-11-22 ENCOUNTER — Other Ambulatory Visit: Payer: Self-pay

## 2018-11-22 ENCOUNTER — Encounter: Payer: Self-pay | Admitting: Interventional Cardiology

## 2018-11-22 ENCOUNTER — Telehealth (INDEPENDENT_AMBULATORY_CARE_PROVIDER_SITE_OTHER): Payer: Medicare Other | Admitting: Interventional Cardiology

## 2018-11-22 VITALS — Ht 71.0 in | Wt 200.0 lb

## 2018-11-22 DIAGNOSIS — I10 Essential (primary) hypertension: Secondary | ICD-10-CM | POA: Diagnosis not present

## 2018-11-22 DIAGNOSIS — I25119 Atherosclerotic heart disease of native coronary artery with unspecified angina pectoris: Secondary | ICD-10-CM | POA: Diagnosis not present

## 2018-11-22 DIAGNOSIS — I5032 Chronic diastolic (congestive) heart failure: Secondary | ICD-10-CM

## 2018-11-22 DIAGNOSIS — E785 Hyperlipidemia, unspecified: Secondary | ICD-10-CM

## 2018-11-22 DIAGNOSIS — Z720 Tobacco use: Secondary | ICD-10-CM | POA: Diagnosis not present

## 2018-11-22 DIAGNOSIS — E782 Mixed hyperlipidemia: Secondary | ICD-10-CM

## 2018-11-22 MED ORDER — CLOPIDOGREL BISULFATE 75 MG PO TABS: ORAL_TABLET | ORAL | 3 refills | Status: DC

## 2018-11-22 MED ORDER — ROSUVASTATIN CALCIUM 20 MG PO TABS: ORAL_TABLET | ORAL | 3 refills | Status: DC

## 2018-11-22 MED ORDER — ISOSORBIDE MONONITRATE ER 30 MG PO TB24: ORAL_TABLET | ORAL | 3 refills | Status: DC

## 2018-11-22 MED ORDER — NITROGLYCERIN 0.4 MG SL SUBL: 0.4000 mg | SUBLINGUAL_TABLET | SUBLINGUAL | 3 refills | Status: DC | PRN

## 2018-11-22 MED ORDER — AMLODIPINE BESYLATE 5 MG PO TABS: ORAL_TABLET | ORAL | 3 refills | Status: DC

## 2018-11-22 MED ORDER — FUROSEMIDE 40 MG PO TABS: ORAL_TABLET | ORAL | 3 refills | Status: DC

## 2018-11-22 NOTE — Patient Instructions (Addendum)
Medication Instructions:  Your physician has recommended you make the following change in your medication:   Take furosemide (lasix) 40 mg tablet twice a week while not feeling well  Lab work: None Ordered  If you have labs (blood work) drawn today and your tests are completely normal, you will receive your results only by: Marland Kitchen MyChart Message (if you have MyChart) OR . A paper copy in the mail If you have any lab test that is abnormal or we need to change your treatment, we will call you to review the results.  Testing/Procedures: None ordered  Follow-Up: Follow up with Dr. Eldridge Dace via VIDEO Visit on 12/31/18 at 1:40 PM.  Any Other Special Instructions Will Be Listed Below (If Applicable).

## 2018-11-24 NOTE — Progress Notes (Signed)
Virtual Visit via Telephone Note  I connected with Chad Moss on 11/25/18 at 11:30 AM EDT by telephone and verified that I am speaking with the correct person using two identifiers.  Location: Patient: Home Provider: Office [facility] - [address], [address], [place], [place] [id number]   I discussed the limitations, risks, security and privacy concerns of performing an evaluation and management service by telephone and the availability of in person appointments. I also discussed with the patient that there may be a patient responsible charge related to this service. The patient expressed understanding and agreed to proceed.  Patient consented to consult via telephone: Yes People present and their role in pt care: Pt   History of Present Illness: 63 year old male current every day smoker followed in our office for COPD  PMH: Hyperlipidemia, hypertension, CAD, paranoid schizophrenic Smoker/ Smoking History: Current Everyday Smoker. 47 pack year smoker.  Maintenance: Spiriva HandiHaler 18mcg Pt of: Dr. Marchelle Gearingamaswamy  Chief complaint:    63 year old male former smoker (recently quit 6 days ago) followed in our office for COPD.  Patient was last seen in our office 1 week ago and was brought to the emergency department where he was found to be positive for COVID with a viral pneumonia seen on CT and x-ray.  Patient and his aide are reporting that he is doing much better since last being seen in our office.  Patient has seen an increase in his overall appetite.  Patient's been eating better.  Patient is also reported that he feels better.  Patient's aide has a older thermometer that she reports patient has remained afebrile with.  Patient has been taking Tylenol cold and flu.  Observations/Objective:  10/03/2017-CT chest lung cancer screening- lung RADS 2, benign appearance, follow-up in 12 months  11/19/2018-CT chest with contrast- left lower lobe airspace opacity is noted consistent  with pneumonia, 5 mm right middle lobe nodule is noted no follow-up needed patient is low risk, noncontrast chest CT can be considered in 12 months if patient is high risk  07/09/2014-pulmonary function test- FVC 3.71 (87% predicted), postbronchodilator ratio 78, postbronchodilator FEV1 2.78 (84% predicted), no bronchodilator response, DLCO 56  11/19/2018-SARS-CoV-2-positive  Assessment and Plan:  COPD mixed type (HCC) Assessment: Not using rescue inhaler at all Has been using nebulizers 1 time daily per aide Uses Spiriva HandiHaler 1 time daily Stop smoking 6 days ago Patient denying wheezing, cough, increased shortness of breath  Plan: Continue Spiriva HandiHaler daily Can use albuterol nebulized meds as needed Can use rescue inhaler as needed Prescription sent for nicotine replacement therapies to help patient continue to not smoke 4-week follow-up with our office with a video visit  COVID-19 virus detected Assessment: 11/19/2018-SARS-CoV-2 positive 11/19/2018 CT chest with contrast shows left lower lobe pneumonia likely due to SARS-CoV-2 Patient reporting improve clinical symptoms since being discharged from the emergency room on 11/19/2018  Plan: MyChart home monitoring for COVID-19 has been ordered Temperature monitoring for my chart has been ordered 4-week follow-up with our office as a video visit We have mailed home monitoring guidelines to the patient last week and will re-mail the same instructions today with his office visit AVS  Once outside of the 6-week testing window that we can consider the below information:  Patient can consider donating plasma for the convalescent plasma program to help with future COVID 19 patients Pt can donate either via  . [email or web address]   We can  also refer to [facility] for serology validation study      Follow Up Instructions:  Return in about 4 weeks (around 12/23/2018), or  if symptoms worsen or fail to improve, for Follow up with Wyn Quaker FNP-C.   I discussed the assessment and treatment plan with the patient. The patient was provided an opportunity to ask questions and all were answered. The patient agreed with the plan and demonstrated an understanding of the instructions.   The patient was advised to call back or seek an in-person evaluation if the symptoms worsen or if the condition fails to improve as anticipated.  I provided 24 minutes of non-face-to-face time during this encounter.   Lauraine Rinne, NP

## 2018-11-25 ENCOUNTER — Other Ambulatory Visit: Payer: Self-pay

## 2018-11-25 ENCOUNTER — Telehealth: Payer: Self-pay

## 2018-11-25 ENCOUNTER — Ambulatory Visit (INDEPENDENT_AMBULATORY_CARE_PROVIDER_SITE_OTHER): Payer: Medicare Other | Admitting: Nurse Practitioner

## 2018-11-25 ENCOUNTER — Encounter: Payer: Self-pay | Admitting: Nurse Practitioner

## 2018-11-25 ENCOUNTER — Encounter: Payer: Self-pay | Admitting: Pulmonary Disease

## 2018-11-25 ENCOUNTER — Ambulatory Visit (INDEPENDENT_AMBULATORY_CARE_PROVIDER_SITE_OTHER): Payer: Medicare Other | Admitting: Pulmonary Disease

## 2018-11-25 DIAGNOSIS — U071 COVID-19: Secondary | ICD-10-CM

## 2018-11-25 DIAGNOSIS — Z720 Tobacco use: Secondary | ICD-10-CM | POA: Diagnosis not present

## 2018-11-25 DIAGNOSIS — J449 Chronic obstructive pulmonary disease, unspecified: Secondary | ICD-10-CM

## 2018-11-25 HISTORY — DX: COVID-19: U07.1

## 2018-11-25 MED ORDER — NICOTINE POLACRILEX 4 MG MT LOZG: 4.0000 mg | LOZENGE | OROMUCOSAL | 3 refills | Status: DC | PRN

## 2018-11-25 MED ORDER — NICOTINE 21 MG/24HR TD PT24: 21.0000 mg | MEDICATED_PATCH | Freq: Every day | TRANSDERMAL | 3 refills | Status: DC

## 2018-11-25 NOTE — Patient Instructions (Addendum)
Continue Spiriva Handihaler 18mg  daily   Can use albuterol nebulizers every 4-6 hours as needed      We recommend that you stop smoking.  >>>Great job stopping smoking, You need to set a quit date >>>If you have friends or family who smoke, let them know you are trying to quit and not to smoke around you or in your living environment  Smoking Cessation Resources:  1 800 QUIT NOW  >>> Patient to call this resource and utilize it to help support her quit smoking >>> Keep up your hard work with stopping smoking  You can also contact the [facility] >>>For smoking cessation classes call [phone]  We do not recommend using e-cigarettes as a form of stopping smoking  You can sign up for smoking cessation support texts and information:  >>>https://smokefree.gov/smokefreetxt   Nicotine patches: >>>Make sure you rotate sites that you do not get skin irritation, Apply 1 patch each morning to a non-hairy skin site  If you are smoking greater than 10 cigarettes/day and weigh over 45 kg start with the nicotine patch of 21 mg a day for 6 weeks, then 14 mg a day for 2 weeks, then finished with 7 mg a day for 2 weeks, then stop  >>>If insomnia occurs you are having trouble sleeping you can take the patch off at night, and place a new one on in the morning >>>If the patch is removed at night and you have morning cravings start short acting nicotine replacement therapy such as gum or lozenges  >>>Avoid acidic beverages such as coffee, carbonated beverages before and during gum / lozenges use.  A soft acidic beverages lower oral pH which cause nicotine to not be absorbed properly >>>If you chew the gum too quickly or vigorously you could have nausea, vomiting, abdominal pain, constipation, hiccups, headache, sore jaw, mouth irritation ulcers  Nicotine lozenge: Lozenges are commonly uses short acting NRT product  >>>Smokers who smoke within 30 minutes of awakening should use 4  mg dose  Can use up to 1 lozenge every 1-2 hours for 6 weeks >>>Total amount of lozenges that can be used per day as 20 >>>Gradually reduce number of lozenges used per day after 2 weeks of use  Place lozenge in mouth and allowed to dissolve for 30 minutes loss and does not need to be chewed  Lozenges have advantages to be able to be used in people with TMG, poor dentition, dentures    Return in about 4 weeks (around 12/23/2018), or if symptoms worsen or fail to improve, for Follow up with Elisha HeadlandBrian Lynna Zamorano FNP-C. >>> Video Visit    Coronavirus (COVID-19) Are you at risk?  Are you at risk for the Coronavirus (COVID-19)?  To be considered HIGH RISK for Coronavirus (COVID-19), you have to meet the following criteria:  . Traveled to [place], [place], [place], [place] or [place]; or in the [place] to [place], [place], [place], or [place]; and have fever, cough, and shortness of breath within the last 2 weeks of travel OR . Been in close contact with a person diagnosed with COVID-19 within the last 2 weeks and have fever, cough, and shortness of breath . IF YOU DO NOT MEET THESE CRITERIA, YOU ARE CONSIDERED LOW RISK FOR COVID-19.  What to do if you are HIGH RISK for COVID-19?  Marland Kitchen. If you are having a medical emergency, call 911. . Seek medical care right away. Before you go to a doctor's office, urgent  care or emergency department, call ahead and tell them about your recent travel, contact with someone diagnosed with COVID-19, and your symptoms. You should receive instructions from your physician's office regarding next steps of care.  . When you arrive at healthcare provider, tell the healthcare staff immediately you have returned from visiting [place], [place], [place], [place] or [place]; or traveled in the [place] to [place], [place], [place], or [place]; in the last two weeks or you have been in close contact with a person diagnosed with COVID-19 in the last 2 weeks.    . Tell the health care staff about your symptoms: fever, cough and shortness of breath. . After you have been seen by a medical provider, you will be either: o Tested for (COVID-19) and discharged home on quarantine except to seek medical care if symptoms worsen, and asked to  - Stay home and avoid contact with others until you get your results (4-5 days)  - Avoid travel on public transportation if possible (such as bus, train, or airplane) or o Sent to the Emergency Department by EMS for evaluation, COVID-19 testing, and possible admission depending on your condition and test results.  What to do if you are LOW RISK for COVID-19?  Reduce your risk of any infection by using the same precautions used for avoiding the common cold or flu:  Marland Kitchen Wash your hands often with soap and warm water for at least 20 seconds.  If soap and water are not readily available, use an alcohol-based hand sanitizer with at least 60% alcohol.  . If coughing or sneezing, cover your mouth and nose by coughing or sneezing into the elbow areas of your shirt or coat, into a tissue or into your sleeve (not your hands). . Avoid shaking hands with others and consider head nods or verbal greetings only. . Avoid touching your eyes, nose, or mouth with unwashed hands.  . Avoid close contact with people who are sick. . Avoid places or events with large numbers of people in one location, like concerts or sporting events. . Carefully consider travel plans you have or are making. . If you are planning any travel outside or inside the [place], visit the CDC's Travelers' Health webpage for the latest health notices. . If you have some symptoms but not all symptoms, continue to monitor at home and seek medical attention if your symptoms worsen. . If you are having a medical emergency, call 911.   ADDITIONAL HEALTHCARE OPTIONS FOR PATIENTS  [facility] / e-Visit: [email or web address]          [facility]: [phone]  [facility]: [phone]                   [facility]: [phone]           It is flu season:   >>> Best ways to protect herself from the flu: Receive the yearly flu vaccine, practice good hand hygiene washing with soap and also using hand sanitizer when available, eat a nutritious meals, get adequate rest, hydrate appropriately   Please contact the office if your symptoms worsen or you have concerns that you are not improving.   Thank you for choosing [facility] for your healthcare, and for allowing [place] to partner with you on your healthcare journey. I am thankful to be able to provide care to you today.   Elisha Headland FNP-C     Health Risks of  Smoking Smoking cigarettes is very bad for your health. Tobacco smoke has over 200 known poisons in it. It contains the poisonous gases nitrogen oxide and carbon monoxide. There are over 60 chemicals in tobacco smoke that cause cancer. Smoking is difficult to quit because a chemical in tobacco, called nicotine, causes addiction or dependence. When you smoke and inhale, nicotine is absorbed rapidly into the bloodstream through your lungs. Both inhaled and non-inhaled nicotine may be addictive. What are the risks of cigarette smoke? Cigarette smokers have an increased risk of many serious medical problems, including:  Lung cancer.  Lung disease, such as pneumonia, bronchitis, and emphysema.  Chest pain (angina) and heart attack because the heart is not getting enough oxygen.  Heart disease and peripheral blood vessel disease.  High blood pressure (hypertension).  Stroke.  Oral cancer, including cancer of the lip, mouth, or voice box.  Bladder cancer.  Pancreatic cancer.  Cervical cancer.  Pregnancy complications, including premature birth.  Stillbirths and smaller newborn babies, birth defects, and genetic damage to sperm.  Early  menopause.  Lower estrogen level for women.  Infertility.  Facial wrinkles.  Blindness.  Increased risk of broken bones (fractures).  Senile dementia.  Stomach ulcers and internal bleeding.  Delayed wound healing and increased risk of complications during surgery.  Even smoking lightly shortens your life expectancy by several years. Because of secondhand smoke exposure, children of smokers have an increased risk of the following:  Sudden infant death syndrome (SIDS).  Respiratory infections.  Lung cancer.  Heart disease.  Ear infections. What are the benefits of quitting? There are many health benefits of quitting smoking. Here are some of them:  Within days of quitting smoking, your risk of having a heart attack decreases, your blood flow improves, and your lung capacity improves. Blood pressure, pulse rate, and breathing patterns start returning to normal soon after quitting.  Within months, your lungs may clear up completely.  Quitting for 10 years reduces your risk of developing lung cancer and heart disease to almost that of a nonsmoker.  People who quit may see an improvement in their overall quality of life. How do I quit smoking?     Smoking is an addiction with both physical and psychological effects, and longtime habits can be hard to change. Your health care provider can recommend:  Programs and community resources, which may include group support, education, or talk therapy.  Prescription medicines to help reduce cravings.  Nicotine replacement products, such as patches, gum, and nasal sprays. Use these products only as directed. Do not replace cigarette smoking with electronic cigarettes, which are commonly called e-cigarettes. The safety of e-cigarettes is not known, and some may contain harmful chemicals.  A combination of two or more of these methods. Where to find more information  American Lung Association: www.lung.org  American Cancer  Society: www.cancer.org Summary  Smoking cigarettes is very bad for your health. Cigarette smokers have an increased risk of many serious medical problems, including several cancers, heart disease, and stroke.  Smoking is an addiction with both physical and psychological effects, and longtime habits can be hard to change.  By stopping right away, you can greatly reduce the risk of medical problems for you and your family.  To help you quit smoking, your health care provider can recommend programs, community resources, prescription medicines, and nicotine replacement products such as patches, gum, and nasal sprays. This information is not intended to replace advice given to you by your health care provider.  Make sure you discuss any questions you have with your health care provider. Document Released: 07/13/2004 Document Revised: 09/06/2017 Document Reviewed: 06/09/2016 Elsevier Interactive Patient Education  2019 [employer]    Steps to Quit Smoking  Smoking tobacco can be bad for your health. It can also affect almost every organ in your body. Smoking puts you and people around you at risk for many serious long-lasting (chronic) diseases. Quitting smoking is hard, but it is one of the best things that you can do for your health. It is never too late to quit. What are the benefits of quitting smoking? When you quit smoking, you lower your risk for getting serious diseases and conditions. They can include:  Lung cancer or lung disease.  Heart disease.  Stroke.  Heart attack.  Not being able to have children (infertility).  Weak bones (osteoporosis) and broken bones (fractures). If you have coughing, wheezing, and shortness of breath, those symptoms may get better when you quit. You may also get sick less often. If you are pregnant, quitting smoking can help to lower your chances of having a baby of low birth weight. What can I do to help me quit smoking? Talk with your doctor  about what can help you quit smoking. Some things you can do (strategies) include:  Quitting smoking totally, instead of slowly cutting back how much you smoke over a period of time.  Going to in-person counseling. You are more likely to quit if you go to many counseling sessions.  Using resources and support systems, such as: ? Agricultural engineernline chats with a Veterinary surgeoncounselor. ? Phone quitlines. ? Automotive engineerrinted self-help materials. ? Support groups or group counseling. ? Text messaging programs. ? Mobile phone apps or applications.  Taking medicines. Some of these medicines may have nicotine in them. If you are pregnant or breastfeeding, do not take any medicines to quit smoking unless your doctor says it is okay. Talk with your doctor about counseling or other things that can help you. Talk with your doctor about using more than one strategy at the same time, such as taking medicines while you are also going to in-person counseling. This can help make quitting easier. What things can I do to make it easier to quit? Quitting smoking might feel very hard at first, but there is a lot that you can do to make it easier. Take these steps:  Talk to your family and friends. Ask them to support and encourage you.  Call phone quitlines, reach out to support groups, or work with a Veterinary surgeoncounselor.  Ask people who smoke to not smoke around you.  Avoid places that make you want (trigger) to smoke, such as: ? Bars. ? Parties. ? Smoke-break areas at work.  Spend time with people who do not smoke.  Lower the stress in your life. Stress can make you want to smoke. Try these things to help your stress: ? Getting regular exercise. ? Deep-breathing exercises. ? Yoga. ? Meditating. ? Doing a body scan. To do this, close your eyes, focus on one area of your body at a time from head to toe, and notice which parts of your body are tense. Try to relax the muscles in those areas.  Download or buy apps on your mobile phone or tablet  that can help you stick to your quit plan. There are many free apps, such as QuitGuide from the [employer] developer(Centers for Disease Control and Prevention). You can find more support from smokefree.gov and other websites. This  information is not intended to replace advice given to you by your health care provider. Make sure you discuss any questions you have with your health care provider. Document Released: 04/01/2009 Document Revised: 02/01/2016 Document Reviewed: 10/20/2014 Elsevier Interactive Patient Education  2019 Elsevier Inc.          Person Under Monitoring Name: [facility]  Location: [address] [place] [address]   Infection Prevention Recommendations for Individuals Confirmed to have, or Being Evaluated for, 2019 Novel Coronavirus (COVID-19) Infection Who Receive Care at Home  Individuals who are confirmed to have, or are being evaluated for, COVID-19 should follow the prevention steps below until a healthcare provider or local or state health department says they can return to normal activities.  Stay home except to get medical care You should restrict activities outside your home, except for getting medical care. Do not go to work, school, or public areas, and do not use public transportation or taxis.  Call ahead before visiting your doctor Before your medical appointment, call the healthcare provider and tell them that you have, or are being evaluated for, COVID-19 infection. This will help the healthcare provider's office take steps to keep other people from getting infected. Ask your healthcare provider to call the local or state health department.  Monitor your symptoms Seek prompt medical attention if your illness is worsening (e.g., difficulty breathing). Before going to your medical appointment, call the healthcare provider and tell them that you have, or are being evaluated for, COVID-19 infection. Ask your healthcare provider to call the local or state  health department.  Wear a facemask You should wear a facemask that covers your nose and mouth when you are in the same room with other people and when you visit a healthcare provider. People who live with or visit you should also wear a facemask while they are in the same room with you.  Separate yourself from other people in your home As much as possible, you should stay in a different room from other people in your home. Also, you should use a separate bathroom, if available.  Avoid sharing household items You should not share dishes, drinking glasses, cups, eating utensils, towels, bedding, or other items with other people in your home. After using these items, you should wash them thoroughly with soap and water.  Cover your coughs and sneezes Cover your mouth and nose with a tissue when you cough or sneeze, or you can cough or sneeze into your sleeve. Throw used tissues in a lined trash can, and immediately wash your hands with soap and water for at least 20 seconds or use an alcohol-based hand rub.  Wash your [employer] your hands often and thoroughly with soap and water for at least 20 seconds. You can use an alcohol-based hand sanitizer if soap and water are not available and if your hands are not visibly dirty. Avoid touching your eyes, nose, and mouth with unwashed hands.   Prevention Steps for Caregivers and Household Members of Individuals Confirmed to have, or Being Evaluated for, COVID-19 Infection Being Cared for in the Home  If you live with, or provide care at home for, a person confirmed to have, or being evaluated for, COVID-19 infection please follow these guidelines to prevent infection:  Follow healthcare provider's instructions Make sure that you understand and can help the patient follow any healthcare provider instructions for all care.  Provide for the patient's basic needs You should help the patient with  basic needs in the home and provide support for  getting groceries, prescriptions, and other personal needs.  Monitor the patient's symptoms If they are getting sicker, call his or her medical provider and tell them that the patient has, or is being evaluated for, COVID-19 infection. This will help the healthcare provider's office take steps to keep other people from getting infected. Ask the healthcare provider to call the local or state health department.  Limit the number of people who have contact with the patient  If possible, have only one caregiver for the patient.  Other household members should stay in another home or place of residence. If this is not possible, they should stay  in another room, or be separated from the patient as much as possible. Use a separate bathroom, if available.  Restrict visitors who do not have an essential need to be in the home.  Keep older adults, very young children, and other sick people away from the patient Keep older adults, very young children, and those who have compromised immune systems or chronic health conditions away from the patient. This includes people with chronic heart, lung, or kidney conditions, diabetes, and cancer.  Ensure good ventilation Make sure that shared spaces in the home have good air flow, such as from an air conditioner or an opened window, weather permitting.  Wash your hands often  Wash your hands often and thoroughly with soap and water for at least 20 seconds. You can use an alcohol based hand sanitizer if soap and water are not available and if your hands are not visibly dirty.  Avoid touching your eyes, nose, and mouth with unwashed hands.  Use disposable paper towels to dry your hands. If not available, use dedicated cloth towels and replace them when they become wet.  Wear a facemask and gloves  Wear a disposable facemask at all times in the room and gloves when you touch or have contact with the patient's blood, body fluids, and/or secretions or  excretions, such as sweat, saliva, sputum, nasal mucus, vomit, urine, or feces.  Ensure the mask fits over your nose and mouth tightly, and do not touch it during use.  Throw out disposable facemasks and gloves after using them. Do not reuse.  Wash your hands immediately after removing your facemask and gloves.  If your personal clothing becomes contaminated, carefully remove clothing and launder. Wash your hands after handling contaminated clothing.  Place all used disposable facemasks, gloves, and other waste in a lined container before disposing them with other household waste.  Remove gloves and wash your hands immediately after handling these items.  Do not share dishes, glasses, or other household items with the patient  Avoid sharing household items. You should not share dishes, drinking glasses, cups, eating utensils, towels, bedding, or other items with a patient who is confirmed to have, or being evaluated for, COVID-19 infection.  After the person uses these items, you should wash them thoroughly with soap and water.  Wash laundry thoroughly  Immediately remove and wash clothes or bedding that have blood, body fluids, and/or secretions or excretions, such as sweat, saliva, sputum, nasal mucus, vomit, urine, or feces, on them.  Wear gloves when handling laundry from the patient.  Read and follow directions on labels of laundry or clothing items and detergent. In general, wash and dry with the warmest temperatures recommended on the label.  Clean all areas the individual has used often  Clean all touchable surfaces, such as counters, tabletops,  doorknobs, bathroom fixtures, toilets, phones, keyboards, tablets, and bedside tables, every day. Also, clean any surfaces that may have blood, body fluids, and/or secretions or excretions on them.  Wear gloves when cleaning surfaces the patient has come in contact with.  Use a diluted bleach solution (e.g., dilute bleach with 1 part  bleach and 10 parts water) or a household disinfectant with a label that says EPA-registered for coronaviruses. To make a bleach solution at home, add 1 tablespoon of bleach to 1 quart (4 cups) of water. For a larger supply, add  cup of bleach to 1 gallon (16 cups) of water.  Read labels of cleaning products and follow recommendations provided on product labels. Labels contain instructions for safe and effective use of the cleaning product including precautions you should take when applying the product, such as wearing gloves or eye protection and making sure you have good ventilation during use of the product.  Remove gloves and wash hands immediately after cleaning.  Monitor yourself for signs and symptoms of illness Caregivers and household members are considered close contacts, should monitor their health, and will be asked to limit movement outside of the home to the extent possible. Follow the monitoring steps for close contacts listed on the symptom monitoring form.   ? If you have additional questions, contact your local health department or call the epidemiologist on call at [phone] (available 24/7). ? This guidance is subject to change. For the most up-to-date guidance from [facility], please refer to their website: [email or web address]

## 2018-11-25 NOTE — Assessment & Plan Note (Addendum)
Assessment: 11/19/2018-SARS-CoV-2 positive 11/19/2018 CT chest with contrast shows left lower lobe pneumonia likely due to SARS-CoV-2 Patient reporting improve clinical symptoms since being discharged from the emergency room on 11/19/2018  Plan: [place] home monitoring for COVID-19 has been ordered Temperature monitoring for my chart has been ordered 4-week follow-up with our office as a video visit We have mailed home monitoring guidelines to the patient last week and will re-mail the same instructions today with his office visit AVS  Once outside of the 6-week testing window that we can consider the below information:  Patient can consider donating plasma for the convalescent plasma program to help with future COVID 19 patients Pt can donate either via  . [email or web address] in [facility]   We can also refer to [facility] for serology validation study

## 2018-11-25 NOTE — Progress Notes (Signed)
Virtual Visit via Video   This visit type was conducted due to national recommendations for restrictions regarding the COVID-19 Pandemic (e.g. social distancing) in an effort to limit this patient's exposure and mitigate transmission in our community.  Patients identity confirmed using two different identifiers.  This format is felt to be most appropriate for this patient at this time.  All issues noted in this document were discussed and addressed.  No physical exam was performed (except for noted visual exam findings with Video Visits).    Date:  12/01/2018   ID:  Chad Moss, DOB 1956/06/11, MRN [id number]  Patient Location:  Home - spoke with Chad Moss  Provider location:   Office    Chief Complaint:  Positive for coronavirus  History of Present Illness:    Chad Moss is a 63 y.o. male who presents via video conferencing for a telehealth visit today.    The patient does have symptoms concerning for COVID-19 infection (fever, chills, cough, or new shortness of breath).   He is positive for coronavirus on 11/19/2018 after having shortness of breath and was seen at Pulmonologist who referred him to the ER for further evaluation and his COVID 19 test was positive. His caregiver had been treating him for what she thought was an asthma attack. Smoke free for 6 days.  mucinex capsules and tylenol flu.  Drinking gatorade and pudding.  No problems breathing currently    Past Medical History:  Diagnosis Date  . Allergic rhinitis   . Anxiety   . Atelectasis   . CAD (coronary artery disease), native coronary artery    9/18 PCI/DES x1 to mLCx, mild diffuse nonobstructive disease, EF 55% on Lv gram  . Chronic bronchitis ([place])   . COPD (chronic obstructive pulmonary disease) ([place])   . Crohn's disease ([place])   . Depression   . DVT (deep venous thrombosis) (HCC)    RLE X 2  . Dysphagia   . Dyspnea   . Enlarged prostate   . GERD (gastroesophageal reflux disease)   .  Glaucoma, both eyes   . Heart murmur   . History of stomach ulcers 1980s  . Hyperlipidemia   . Hypertension    "off RX for years now cause of coughing w/Lisinopril" (03/13/2017)  . IBS (irritable bowel syndrome)   . Paranoid schizophrenia ([place])   . Peripheral vascular disease ([place])   . Pneumonia 1990s  . Pre-diabetes    "one time" (03/13/2017)  . Stroke [facility]) 04/2016   "eye stroke; left eye" (03/13/2017)  . Syncopal episodes    Past Surgical History:  Procedure Laterality Date  . ABDOMINAL HERNIA REPAIR  1990s  . COLECTOMY  1985; ?1989; 1996   "part of my ileum removed"  . CORONARY ANGIOPLASTY WITH STENT PLACEMENT  03/13/2017  . CORONARY STENT INTERVENTION N/A 03/13/2017   Procedure: CORONARY STENT INTERVENTION;  Surgeon: Jettie Booze, MD;  Location: [place] [facility];  Service: Cardiovascular;  Laterality: N/A;  . EYE SURGERY Right    "laser; for glaucoma" (03/13/2017)  . FASCIOTOMY Right   . HERNIA REPAIR    . LAPAROSCOPIC CHOLECYSTECTOMY    . LEFT HEART CATH AND CORONARY ANGIOGRAPHY N/A 03/13/2017   Procedure: LEFT HEART CATH AND CORONARY ANGIOGRAPHY;  Surgeon: Jettie Booze, MD;  Location: [place] [facility];  Service: Cardiovascular;  Laterality: N/A;  . PENILE PROSTHESIS IMPLANT  01/2011   Archie Endo 02/01/2011  . PERIPHERAL VASCULAR CATHETERIZATION Right 1999   "had blood clots  in my leg; went in to open them up; dye went into leg; ended up having a fasiotomy"  . TONSILLECTOMY       Current Meds  Medication Sig  . albuterol (PROAIR HFA) 108 (90 Base) MCG/ACT inhaler INHALE 2 PUFFS BY MOUTH INTO THE LUNGS EVERY 6 HOURS AS NEEDED FOR WHEEZING OR SHORTNESS OF BREATH  . albuterol (PROVENTIL) (2.5 MG/3ML) 0.083% nebulizer solution Take 3 mLs (2.5 mg total) by nebulization every 6 (six) hours as needed for wheezing or shortness of breath.  . alfuzosin (UROXATRAL) 10 MG 24 hr tablet Take 10 mg by mouth at bedtime.   Marland Kitchen amLODipine (NORVASC) 5 MG tablet TAKE 1 TABLET(5  MG) BY MOUTH DAILY  . aspirin EC 81 MG tablet Take 81 mg by mouth daily.  . bimatoprost (LUMIGAN) 0.01 % SOLN Place 1 drop into the left eye at bedtime.  . brimonidine (ALPHAGAN) 0.15 % ophthalmic solution Place 1 drop into the left eye 2 (two) times daily.   . cholestyramine (QUESTRAN) 4 G packet Take 1 packet by mouth daily.   . clonazePAM (KLONOPIN) 1 MG tablet Take 1 mg by mouth at bedtime. Anxiety  . clopidogrel (PLAVIX) 75 MG tablet TAKE 1 TABLET(75 MG) BY MOUTH DAILY WITH BREAKFAST  . dicyclomine (BENTYL) 10 MG capsule Take 10 mg by mouth 4 (four) times daily -  before meals and at bedtime.   . diphenhydrAMINE (BENADRYL) 25 MG tablet Take 50 mg by mouth at bedtime.   . fesoterodine (TOVIAZ) 4 MG TB24 tablet Take 4 mg by mouth daily.  . finasteride (PROSCAR) 5 MG tablet Take 5 mg by mouth daily.  . furosemide (LASIX) 40 MG tablet Take 1 tablet 2 times a week  . isosorbide mononitrate (IMDUR) 30 MG 24 hr tablet TAKE 1 TABLET(30 MG) BY MOUTH DAILY  . loxapine (LOXITANE) 10 MG capsule Take 10 mg by mouth 2 (two) times daily. 1 tab in AM and 2 tabs in PM  . mercaptopurine (PURINETHOL) 50 MG tablet Take 75 mg by mouth daily. 1 tablet and a half tablet Give on an empty stomach 1 hour before or 2 hours after meals. Caution: Chemotherapy.  . mesalamine (PENTASA) 250 MG CR capsule Take 1,000 mg by mouth 4 (four) times daily.   . methocarbamol (ROBAXIN) 500 MG tablet Take 1 tablet (500 mg total) by mouth 2 (two) times daily.  . mirtazapine (REMERON) 30 MG tablet Take 30 mg by mouth at bedtime.   . nicotine (NICODERM CQ) 21 mg/24hr patch Place 1 patch (21 mg total) onto the skin daily.  . nicotine polacrilex (COMMIT) 4 MG lozenge Take 1 lozenge (4 mg total) by mouth as needed for smoking cessation.  . nitroGLYCERIN (NITROSTAT) 0.4 MG SL tablet Place 1 tablet (0.4 mg total) under the tongue every 5 (five) minutes x 3 doses as needed for chest pain.  . pantoprazole (PROTONIX) 40 MG tablet TAKE 1  TABLET(40 MG) BY MOUTH TWICE DAILY  . potassium chloride (K-DUR) 10 MEQ tablet Take 1 tablet (10 mEq total) by mouth 2 (two) times daily.  . rosuvastatin (CRESTOR) 20 MG tablet TAKE 1 TABLET(20 MG) BY MOUTH DAILY  . timolol (TIMOPTIC) 0.5 % ophthalmic solution Place 1 drop into the left eye 2 (two) times daily.  Marland Kitchen tiotropium (SPIRIVA HANDIHALER) 18 MCG inhalation capsule INHALE THE CONTENT OF 1 CAPSULE VIA HANDIHALER DAILY  . traZODone (DESYREL) 50 MG tablet Take 150 mg by mouth at bedtime.     Allergies:  Benztropine, Codeine, Meperidine, Cyclobenzaprine, Penicillins, Sulfamethoxazole-trimethoprim, and Sulfonamide derivatives   Social History   Tobacco Use  . Smoking status: Current Every Day Smoker    Packs/day: 1.50    Years: 50.00    Pack years: 75.00    Types: Cigarettes    Start date: 11/18/1968  . Smokeless tobacco: Never Used  . Tobacco comment: Patient given smoking cessation information  Substance Use Topics  . Alcohol use: No    Alcohol/week: 0.0 standard drinks  . Drug use: No     Family Hx: The patient's family history includes Heart disease in his father and mother; Hypertension in his father and mother; Lung cancer in his brother.  ROS:   Please see the history of present illness.    Review of Systems  Constitutional: Negative.  Negative for fever (no fever today).  Respiratory: Negative.   Cardiovascular: Negative.   Neurological: Negative for dizziness and tingling.  Psychiatric/Behavioral: Negative.     All other systems reviewed and are negative.   Labs/Other Tests and Data Reviewed:    Recent Labs: 11/19/2018: ALT 60; B Natriuretic Peptide 8.0; BUN 6; Creatinine, Ser 1.01; Hemoglobin 11.6; Platelets 172; Potassium 3.1; Sodium 139   Recent Lipid Panel Lab Results  Component Value Date/Time   CHOL 106 03/28/2018 11:52 AM   TRIG 132 03/28/2018 11:52 AM   HDL 36 (L) 03/28/2018 11:52 AM   CHOLHDL 2.9 03/28/2018 11:52 AM   LDLCALC 44 03/28/2018 11:52  AM    Wt Readings from Last 3 Encounters:  11/22/18 200 lb (90.7 kg)  11/19/18 214 lb (97.1 kg)  11/19/18 214 lb 6.4 oz (97.3 kg)     Exam:    Vital Signs:  There were no vitals taken for this visit.    Physical Exam  Constitutional: He is oriented to person, place, and time and well-developed, well-nourished, and in no distress.  Neurological: He is alert and oriented to person, place, and time.  Psychiatric: Mood, memory, affect and judgment normal.    ASSESSMENT & PLAN:   1. COVID-19 virus detected  Caregiver and patient feel he is doing better  Will have him retested once he is 14 days out and/or no symptoms x 3 days  Continue with albuterol inhalers as needed if breathing worsens return to ER.    COVID-19 Education: The signs and symptoms of COVID-19 were discussed with the patient and how to seek care for testing (follow up with PCP or arrange E-visit).  The importance of social distancing was discussed today.  Patient Risk:   After full review of this patients clinical status, I feel that they are at least moderate risk at this time.  Time:   Today, I have spent 14 minutes/ seconds with the patient with telehealth technology discussing above diagnoses.     Medication Adjustments/Labs and Tests Ordered: Current medicines are reviewed at length with the patient today.  Concerns regarding medicines are outlined above.   Tests Ordered: No orders of the defined types were placed in this encounter.   Medication Changes: No orders of the defined types were placed in this encounter.   Disposition:  Follow up prn  Signed, Chad FeltsJanece Dimitri Shakespeare, FNP

## 2018-11-25 NOTE — Telephone Encounter (Signed)
Call made to caregiver Mliss Sax to make f/u appt. She states she was busy and she would call back to set this appt. Will route this message to triage to be addressed in left messages tomorrow.   Per Wyn Quaker, please schedule a 4 week video visit and confirm mailing address. Temp address [address] [place] [address]

## 2018-11-25 NOTE — Assessment & Plan Note (Signed)
Assessment: Not using rescue inhaler at all Has been using nebulizers 1 time daily per aide Uses Spiriva HandiHaler 1 time daily Stop smoking 6 days ago Patient denying wheezing, cough, increased shortness of breath  Plan: Continue Spiriva HandiHaler daily Can use albuterol nebulized meds as needed Can use rescue inhaler as needed Prescription sent for nicotine replacement therapies to help patient continue to not smoke 4-week follow-up with our office with a video visit

## 2018-11-26 NOTE — Telephone Encounter (Signed)
[place] with pt's caregiver, Mliss Sax, and scheduled Mychart visit for 12/26/2018 at 3 pm  Confirmed that the pt has smart device to do the visit (I phone)

## 2018-12-03 DIAGNOSIS — Z8619 Personal history of other infectious and parasitic diseases: Secondary | ICD-10-CM | POA: Diagnosis not present

## 2018-12-07 ENCOUNTER — Telehealth: Payer: Self-pay | Admitting: Pulmonary Disease

## 2018-12-07 MED ORDER — PREDNISONE 20 MG PO TABS: 40.0000 mg | ORAL_TABLET | Freq: Every day | ORAL | 0 refills | Status: AC

## 2018-12-07 NOTE — Telephone Encounter (Signed)
PT of Dr Chase Caller. I was called inappropriately as answering service thought his MD is Dr Ashby Dawes. Nonetheless, I dealt with the issue. His care provider reports that he smokes 1 PPD. He has had recent COVID-19 diagnosed 06/02 - never hospitalized.   His care provider now reports increased cough and mild SOB. Previously, prednisone has helped in similar situations. He has no sputum production, hemoptysis or fever reported.  Prednisone 40 mg X 5 days Counseled that he must quit smoking  Dr Chase Caller to arrange proper follow up  Merton Border, MD PCCM service Mobile [phone] Pager [phone] 12/07/2018 9:33 AM

## 2018-12-09 ENCOUNTER — Telehealth: Payer: Self-pay | Admitting: Pulmonary Disease

## 2018-12-09 NOTE — Telephone Encounter (Signed)
LMTCB

## 2018-12-09 NOTE — Telephone Encounter (Signed)
Pt's caretaker Glo Herring is calling back [phone]

## 2018-12-09 NOTE — Telephone Encounter (Signed)
LMOM TCB x1 for [employer] and spoke with Jinny Blossom to check on this.  Per Jinny Blossom, Medicaid will not pay for the nicotine patches or lozenges because they are OTC.

## 2018-12-10 NOTE — Telephone Encounter (Signed)
Called and spoke with Chad Moss letting her know insurance will not pay for nicotine patches or lozenges as they are both available OTC. Chad Moss stated they are not going to be able to afford to pay for these meds OTC. She stated there was another medication that the pharmacy had mentioned to her for them to try to see if we could do a prescription for pt but she could not remember the name of the medication.  I stated to [facility] to contact pharmacy to find out the name of the other med she was talking about and then we could check with Aaron Edelman to see if he was okay with [place] sending Rx in for pt of that med. Will await a return call from [place].

## 2018-12-11 NOTE — Telephone Encounter (Signed)
Called and spoke [place].  Chad Moss stated she spoke with pharmacy and was recommended Chantix.   Chad Moss is requestin a prescription for Chantix to be sent to pharmacy. Chad Moss also wanted Aaron Edelman, NP to know that Patient had received covid antibody test and it was positive. She stated previous covid swab showed negative.  Message routed to Aaron Edelman, NP to advise on Chantix

## 2018-12-11 NOTE — Telephone Encounter (Signed)
Pt will need to follow up with Primary Care AND Psychiatry regarding chantix starting.   Pt with chronic psychiatric disorders which is typically a contraindication for chantix  Aaron Edelman

## 2018-12-11 NOTE — Telephone Encounter (Signed)
Called spoke with Mliss Sax - advised of Brian's recommendation to discuss Chantix with PCP and Psychiatry d/t contraindications.  Mliss Sax voiced her understanding and denied any questions/concerns.  Nothing further needed at this time; will sign off.

## 2018-12-13 ENCOUNTER — Other Ambulatory Visit: Payer: Self-pay | Admitting: Internal Medicine

## 2018-12-17 DIAGNOSIS — J449 Chronic obstructive pulmonary disease, unspecified: Secondary | ICD-10-CM | POA: Diagnosis not present

## 2018-12-19 LAB — NOVEL CORONAVIRUS, NAA: SARS-CoV-2, NAA: NOT DETECTED

## 2018-12-23 ENCOUNTER — Other Ambulatory Visit: Payer: Self-pay | Admitting: Internal Medicine

## 2018-12-24 ENCOUNTER — Telehealth: Payer: Self-pay | Admitting: Nurse Practitioner

## 2018-12-24 DIAGNOSIS — H2513 Age-related nuclear cataract, bilateral: Secondary | ICD-10-CM | POA: Diagnosis not present

## 2018-12-24 DIAGNOSIS — H3412 Central retinal artery occlusion, left eye: Secondary | ICD-10-CM | POA: Diagnosis not present

## 2018-12-24 DIAGNOSIS — H401133 Primary open-angle glaucoma, bilateral, severe stage: Secondary | ICD-10-CM | POA: Diagnosis not present

## 2018-12-24 NOTE — Telephone Encounter (Signed)
Pt called for covid results. Let patient know that it was negative.

## 2018-12-24 NOTE — Telephone Encounter (Signed)
See result note.  

## 2018-12-26 ENCOUNTER — Telehealth: Payer: Medicare Other | Admitting: Pulmonary Disease

## 2018-12-29 NOTE — Progress Notes (Signed)
Virtual Visit via Video Note   This visit type was conducted due to national recommendations for restrictions regarding the COVID-19 Pandemic (e.g. social distancing) in an effort to limit this patient's exposure and mitigate transmission in our community.  Due to his co-morbid illnesses, this patient is at least at moderate risk for complications without adequate follow up.  This format is felt to be most appropriate for this patient at this time.  All issues noted in this document were discussed and addressed.  A limited physical exam was performed with this format.  Please refer to the patient's chart for his consent to telehealth for [facility].   Date:  12/31/2018   ID:  Chad Moss, DOB 1955/10/17, MRN [id number]  Patient Location: Home Provider Location: Home  PCP:  [name], FNP  Cardiologist:  Larae Grooms, MD  Electrophysiologist:  None   Evaluation Performed:  Follow-Up Visit  Chief Complaint:  CAD  History of Present Illness:    Chad Moss is a 63 y.o. 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 Long h/o tobacco abuse.   Cath on 03/13/17 showed:   The left ventricular systolic function is normal.  LV end diastolic pressure is normal.  The left ventricular ejection fraction is 55-65% by visual estimate.  There is no aortic valve stenosis.  Mid Cx lesion, 75 %stenosed.  A STENT RESOLUTE ONYX [id number] drug eluting stent was successfully placed.  Post intervention, there is a 0% residual stenosis.  Unable to use right radial artery due to high bifurcation of radial and ulnar arteries.  He was advised to stop smoking,but has not been able to stop.  In 2019, he had gained weight and had leg swelling.   The patient does not have symptoms concerning for COVID-19 infection (fever, chills, cough, or new shortness of breath).   He tested positive for COVID 19 in June 2020.   Lasix  was decreased to 2x/week while he was not eating much. He stopped smoking while infected with COVID.   He recovered from COVID-19.  His girlfriend , Chad Moss, had it as well.  Denies : Chest pain. Dizziness. Leg edema. Nitroglycerin use. Palpitations. Paroxysmal nocturnal dyspnea. Shortness of breath. Syncope.    Past Medical History:  Diagnosis Date  . Allergic rhinitis   . Anxiety   . Atelectasis   . CAD (coronary artery disease), native coronary artery    9/18 PCI/DES x1 to mLCx, mild diffuse nonobstructive disease, EF 55% on Lv gram  . Chronic bronchitis ([place])   . COPD (chronic obstructive pulmonary disease) ([place])   . Crohn's disease ([place])   . Depression   . DVT (deep venous thrombosis) (HCC)    RLE X 2  . Dysphagia   . Dyspnea   . Enlarged prostate   . GERD (gastroesophageal reflux disease)   . Glaucoma, both eyes   . Heart murmur   . History of stomach ulcers 1980s  . Hyperlipidemia   . Hypertension    "off RX for years now cause of coughing w/Lisinopril" (03/13/2017)  . IBS (irritable bowel syndrome)   . Paranoid schizophrenia ([place])   . Peripheral vascular disease ([place])   . Pneumonia 1990s  . Pre-diabetes    "one time" (03/13/2017)  . Stroke [facility]) 04/2016   "eye stroke; left eye" (03/13/2017)  . Syncopal episodes    Past Surgical History:  Procedure Laterality Date  . ABDOMINAL HERNIA REPAIR  1990s  .  COLECTOMY  1985; ?1989; 1996   "part of my ileum removed"  . CORONARY ANGIOPLASTY WITH STENT PLACEMENT  03/13/2017  . CORONARY STENT INTERVENTION N/A 03/13/2017   Procedure: CORONARY STENT INTERVENTION;  Surgeon: Jettie Booze, MD;  Location: [place] [facility];  Service: Cardiovascular;  Laterality: N/A;  . EYE SURGERY Right    "laser; for glaucoma" (03/13/2017)  . FASCIOTOMY Right   . HERNIA REPAIR    . LAPAROSCOPIC CHOLECYSTECTOMY    . LEFT HEART CATH AND CORONARY ANGIOGRAPHY N/A 03/13/2017   Procedure: LEFT HEART CATH AND CORONARY ANGIOGRAPHY;   Surgeon: Jettie Booze, MD;  Location: [place] [facility];  Service: Cardiovascular;  Laterality: N/A;  . PENILE PROSTHESIS IMPLANT  01/2011   Archie Endo 02/01/2011  . PERIPHERAL VASCULAR CATHETERIZATION Right 1999   "had blood clots in my leg; went in to open them up; dye went into leg; ended up having a fasiotomy"  . TONSILLECTOMY       Current Meds  Medication Sig  . albuterol (PROAIR HFA) 108 (90 Base) MCG/ACT inhaler INHALE 2 PUFFS BY MOUTH INTO THE LUNGS EVERY 6 HOURS AS NEEDED FOR WHEEZING OR SHORTNESS OF BREATH  . albuterol (PROVENTIL) (2.5 MG/3ML) 0.083% nebulizer solution Take 3 mLs (2.5 mg total) by nebulization every 6 (six) hours as needed for wheezing or shortness of breath.  . alfuzosin (UROXATRAL) 10 MG 24 hr tablet Take 10 mg by mouth at bedtime.   Marland Kitchen amLODipine (NORVASC) 5 MG tablet TAKE 1 TABLET(5 MG) BY MOUTH DAILY  . aspirin EC 81 MG tablet Take 81 mg by mouth daily.  . bimatoprost (LUMIGAN) 0.01 % SOLN Place 1 drop into the left eye at bedtime.  . brimonidine (ALPHAGAN) 0.15 % ophthalmic solution Place 1 drop into the left eye 2 (two) times daily.   . cholestyramine (QUESTRAN) 4 G packet Take 1 packet by mouth daily.   . clonazePAM (KLONOPIN) 1 MG tablet Take 1 mg by mouth at bedtime. Anxiety  . clopidogrel (PLAVIX) 75 MG tablet Take 75 mg by mouth daily.  Marland Kitchen dicyclomine (BENTYL) 10 MG capsule Take 10 mg by mouth 4 (four) times daily -  before meals and at bedtime.   . diphenhydrAMINE (BENADRYL) 25 MG tablet Take 50 mg by mouth at bedtime.   . fesoterodine (TOVIAZ) 4 MG TB24 tablet Take 4 mg by mouth daily.  . finasteride (PROSCAR) 5 MG tablet Take 5 mg by mouth daily.  . furosemide (LASIX) 40 MG tablet Take 1 tablet 2 times a week  . isosorbide mononitrate (IMDUR) 30 MG 24 hr tablet TAKE 1 TABLET(30 MG) BY MOUTH DAILY  . loxapine (LOXITANE) 10 MG capsule Take 10 mg by mouth 2 (two) times daily. 1 tab in AM and 2 tabs in PM  . mercaptopurine (PURINETHOL) 50 MG tablet  Take 75 mg by mouth daily. 1 tablet and a half tablet Give on an empty stomach 1 hour before or 2 hours after meals. Caution: Chemotherapy.  . mesalamine (PENTASA) 250 MG CR capsule Take 1,000 mg by mouth 4 (four) times daily.   . methocarbamol (ROBAXIN) 500 MG tablet Take 1 tablet (500 mg total) by mouth 2 (two) times daily.  . mirtazapine (REMERON) 30 MG tablet Take 45 mg by mouth at bedtime. 1.5 tablets by mouth daily  . nicotine (NICODERM CQ) 21 mg/24hr patch Place 1 patch (21 mg total) onto the skin daily.  . nicotine polacrilex (COMMIT) 4 MG lozenge Take 1 lozenge (4 mg total) by  mouth as needed for smoking cessation.  . nitroGLYCERIN (NITROSTAT) 0.4 MG SL tablet Place 1 tablet (0.4 mg total) under the tongue every 5 (five) minutes x 3 doses as needed for chest pain.  . pantoprazole (PROTONIX) 40 MG tablet TAKE 1 TABLET(40 MG) BY MOUTH TWICE DAILY  . potassium chloride (K-DUR) 10 MEQ tablet Take 1 tablet (10 mEq total) by mouth 2 (two) times daily.  . rosuvastatin (CRESTOR) 20 MG tablet TAKE 1 TABLET(20 MG) BY MOUTH DAILY  . tamsulosin (FLOMAX) 0.4 MG CAPS capsule TK 1 C PO HS  . timolol (TIMOPTIC) 0.5 % ophthalmic solution Place 1 drop into the left eye 2 (two) times daily.  Marland Kitchen tiotropium (SPIRIVA HANDIHALER) 18 MCG inhalation capsule INHALE THE CONTENT OF 1 CAPSULE VIA HANDIHALER DAILY  . traZODone (DESYREL) 50 MG tablet Take 150 mg by mouth at bedtime.     Allergies:   Benztropine, Codeine, Meperidine, Cyclobenzaprine, Penicillins, Sulfamethoxazole-trimethoprim, and Sulfonamide derivatives   Social History   Tobacco Use  . Smoking status: Current Every Day Smoker    Packs/day: 1.50    Years: 50.00    Pack years: 75.00    Types: Cigarettes    Start date: 11/18/1968  . Smokeless tobacco: Never Used  . Tobacco comment: Patient given smoking cessation information  Substance Use Topics  . Alcohol use: No    Alcohol/week: 0.0 standard drinks  . Drug use: No     Family Hx: The  patient's family history includes Heart disease in his father and mother; Hypertension in his father and mother; Lung cancer in his brother.  ROS:   Please see the history of present illness.    Recovered from [place] 19. All other systems reviewed and are negative.   Prior CV studies:   The following studies were reviewed today:  Last COVID test was negative  Labs/Other Tests and Data Reviewed:    EKG:  An ECG dated 11/2018 was personally reviewed today and demonstrated:  sinus tach, no ST segment changes  Recent Labs: 11/19/2018: ALT 60; B Natriuretic Peptide 8.0; BUN 6; Creatinine, Ser 1.01; Hemoglobin 11.6; Platelets 172; Potassium 3.1; Sodium 139   Recent Lipid Panel Lab Results  Component Value Date/Time   CHOL 106 03/28/2018 11:52 AM   TRIG 132 03/28/2018 11:52 AM   HDL 36 (L) 03/28/2018 11:52 AM   CHOLHDL 2.9 03/28/2018 11:52 AM   LDLCALC 44 03/28/2018 11:52 AM    Wt Readings from Last 3 Encounters:  12/31/18 227 lb (103 kg)  11/22/18 200 lb (90.7 kg)  11/19/18 214 lb (97.1 kg)     Objective:    Vital Signs:  Ht 5' 11"  (1.803 m)   Wt 227 lb (103 kg)   BMI 31.66 kg/m    VITAL SIGNS:  reviewed GEN:  no acute distress RESPIRATORY:  normal respiratory effort, symmetric expansion PSYCH:  normal affect exam limited by video format.  ASSESSMENT & PLAN:    1. CAD:  No angina.  Continue aggressive secondary prevention.  2. Hyperlipidemia: LDL 44.  Crestor continuing for long term.  3. Tobacco abuse: He started back smoking after recovering from COVID-19. 4. HTN: Rx for BP cuff given.  Will see if Medicaid will pay for this.  5. Chronic diastolic heart failure:  REcent COVID infection.  Back to usual Lasix dose.  Elevate legs for leg swelling.   COVID-19 Education: The signs and symptoms of COVID-19 were discussed with the patient and how to seek care for testing (follow up  with PCP or arrange E-visit).  The importance of social distancing was discussed today.   Time:   Today, I have spent 15 minutes with the patient with telehealth technology discussing the above problems.     Medication Adjustments/Labs and Tests Ordered: Current medicines are reviewed at length with the patient today.  Concerns regarding medicines are outlined above.   Tests Ordered: No orders of the defined types were placed in this encounter.   Medication Changes: No orders of the defined types were placed in this encounter.   Follow Up:  Virtual Visit or In Person in 6 month(s)  Signed, Larae Grooms, MD  12/31/2018 2:08 PM    [facility]

## 2018-12-31 ENCOUNTER — Encounter: Payer: Self-pay | Admitting: Interventional Cardiology

## 2018-12-31 ENCOUNTER — Telehealth (INDEPENDENT_AMBULATORY_CARE_PROVIDER_SITE_OTHER): Payer: Medicare Other | Admitting: Interventional Cardiology

## 2018-12-31 ENCOUNTER — Other Ambulatory Visit: Payer: Self-pay

## 2018-12-31 VITALS — Ht 71.0 in | Wt 227.0 lb

## 2018-12-31 DIAGNOSIS — E782 Mixed hyperlipidemia: Secondary | ICD-10-CM

## 2018-12-31 DIAGNOSIS — I110 Hypertensive heart disease with heart failure: Secondary | ICD-10-CM | POA: Diagnosis not present

## 2018-12-31 DIAGNOSIS — E785 Hyperlipidemia, unspecified: Secondary | ICD-10-CM | POA: Diagnosis not present

## 2018-12-31 DIAGNOSIS — Z720 Tobacco use: Secondary | ICD-10-CM | POA: Diagnosis not present

## 2018-12-31 DIAGNOSIS — I25119 Atherosclerotic heart disease of native coronary artery with unspecified angina pectoris: Secondary | ICD-10-CM | POA: Diagnosis not present

## 2018-12-31 DIAGNOSIS — I5032 Chronic diastolic (congestive) heart failure: Secondary | ICD-10-CM | POA: Diagnosis not present

## 2018-12-31 DIAGNOSIS — I10 Essential (primary) hypertension: Secondary | ICD-10-CM

## 2018-12-31 NOTE — Patient Instructions (Signed)
Medication Instructions:  Your physician recommends that you continue on your current medications as directed. Please refer to the Current Medication list given to you today.  If you need a refill on your cardiac medications before your next appointment, please call your pharmacy.   Lab work: None Ordered  If you have labs (blood work) drawn today and your tests are completely normal, you will receive your results only by:  [place] (if you have MyChart) OR  A paper copy in the mail If you have any lab test that is abnormal or we need to change your treatment, we will call you to review the results.  Testing/Procedures: None ordered  Follow-Up: At [facility], you and your health needs are our priority.  As part of our continuing mission to provide you with exceptional heart care, we have created designated Provider Care Teams.  These Care Teams include your primary Cardiologist (physician) and Advanced Practice Providers (APPs -  Physician Assistants and Nurse Practitioners) who all work together to provide you with the care you need, when you need it.  You will need a follow up appointment in 6 months.  Please call our office 2 months in advance to schedule this appointment.  You may see Casandra Doffing, MD or one of the following Advanced Practice Providers on your designated Care Team:    Lyda Jester, PA-C  Dayna Dunn, PA-C  Ermalinda Barrios, PA-C  Any Other Special Instructions Will Be Listed Below (If Applicable).  Weigh yourself daily.   Low-Sodium Eating Plan Sodium, which is an element that makes up salt, helps you maintain a healthy balance of fluids in your body. Too much sodium can increase your blood pressure and cause fluid and waste to be held in your body. Your health care provider or dietitian may recommend following this plan if you have high blood pressure (hypertension), kidney disease, liver disease, or heart failure. Eating less sodium can help  lower your blood pressure, reduce swelling, and protect your heart, liver, and kidneys. What are tips for following this plan? General guidelines  Most people on this plan should limit their sodium intake to 1,500-2,000 mg (milligrams) of sodium each day. Reading food labels   The Nutrition Facts label lists the amount of sodium in one serving of the food. If you eat more than one serving, you must multiply the listed amount of sodium by the number of servings.  Choose foods with less than 140 mg of sodium per serving.  Avoid foods with 300 mg of sodium or more per serving. Shopping  Look for lower-sodium products, often labeled as "low-sodium" or "no salt added."  Always check the sodium content even if foods are labeled as "unsalted" or "no salt added".  Buy fresh foods. ? Avoid canned foods and premade or frozen meals. ? Avoid canned, cured, or processed meats  Buy breads that have less than 80 mg of sodium per slice. Cooking  Eat more home-cooked food and less restaurant, buffet, and fast food.  Avoid adding salt when cooking. Use salt-free seasonings or herbs instead of table salt or sea salt. Check with your health care provider or pharmacist before using salt substitutes.  Cook with plant-based oils, such as canola, sunflower, or olive oil. Meal planning  When eating at a restaurant, ask that your food be prepared with less salt or no salt, if possible.  Avoid foods that contain MSG (monosodium glutamate). MSG is sometimes added to [place] food, bouillon, and some canned foods.  What foods are recommended? The items listed may not be a complete list. Talk with your dietitian about what dietary choices are best for you. Grains Low-sodium cereals, including oats, puffed wheat and rice, and shredded wheat. Low-sodium crackers. Unsalted rice. Unsalted pasta. Low-sodium bread. Whole-grain breads and whole-grain pasta. Vegetables Fresh or frozen vegetables. "No salt added"  canned vegetables. "No salt added" tomato sauce and paste. Low-sodium or reduced-sodium tomato and vegetable juice. Fruits Fresh, frozen, or canned fruit. Fruit juice. Meats and other protein foods Fresh or frozen (no salt added) meat, poultry, seafood, and fish. Low-sodium canned tuna and salmon. Unsalted nuts. Dried peas, beans, and lentils without added salt. Unsalted canned beans. Eggs. Unsalted nut butters. Dairy Milk. Soy milk. Cheese that is naturally low in sodium, such as ricotta cheese, fresh mozzarella, or Swiss cheese Low-sodium or reduced-sodium cheese. Cream cheese. Yogurt. Fats and oils Unsalted butter. Unsalted margarine with no trans fat. Vegetable oils such as canola or olive oils. Seasonings and other foods Fresh and dried herbs and spices. Salt-free seasonings. Low-sodium mustard and ketchup. Sodium-free salad dressing. Sodium-free light mayonnaise. Fresh or refrigerated horseradish. Lemon juice. Vinegar. Homemade, reduced-sodium, or low-sodium soups. Unsalted popcorn and pretzels. Low-salt or salt-free chips. What foods are not recommended? The items listed may not be a complete list. Talk with your dietitian about what dietary choices are best for you. Grains Instant hot cereals. Bread stuffing, pancake, and biscuit mixes. Croutons. Seasoned rice or pasta mixes. Noodle soup cups. Boxed or frozen macaroni and cheese. Regular salted crackers. Self-rising flour. Vegetables Sauerkraut, pickled vegetables, and relishes. Olives. [place] fries. Onion rings. Regular canned vegetables (not low-sodium or reduced-sodium). Regular canned tomato sauce and paste (not low-sodium or reduced-sodium). Regular tomato and vegetable juice (not low-sodium or reduced-sodium). Frozen vegetables in sauces. Meats and other protein foods Meat or fish that is salted, canned, smoked, spiced, or pickled. Bacon, ham, sausage, hotdogs, corned beef, chipped beef, packaged lunch meats, salt pork, jerky, pickled  herring, anchovies, regular canned tuna, sardines, salted nuts. Dairy Processed cheese and cheese spreads. Cheese curds. Blue cheese. Feta cheese. String cheese. Regular cottage cheese. Buttermilk. Canned milk. Fats and oils Salted butter. Regular margarine. Ghee. Bacon fat. Seasonings and other foods Onion salt, garlic salt, seasoned salt, table salt, and sea salt. Canned and packaged gravies. Worcestershire sauce. Tartar sauce. Barbecue sauce. Teriyaki sauce. Soy sauce, including reduced-sodium. Steak sauce. Fish sauce. Oyster sauce. Cocktail sauce. Horseradish that you find on the shelf. Regular ketchup and mustard. Meat flavorings and tenderizers. Bouillon cubes. Hot sauce and Tabasco sauce. Premade or packaged marinades. Premade or packaged taco seasonings. Relishes. Regular salad dressings. Salsa. Potato and tortilla chips. Corn chips and puffs. Salted popcorn and pretzels. Canned or dried soups. Pizza. Frozen entrees and pot pies. Summary  Eating less sodium can help lower your blood pressure, reduce swelling, and protect your heart, liver, and kidneys.  Most people on this plan should limit their sodium intake to 1,500-2,000 mg (milligrams) of sodium each day.  Canned, boxed, and frozen foods are high in sodium. Restaurant foods, fast foods, and pizza are also very high in sodium. You also get sodium by adding salt to food.  Try to cook at home, eat more fresh fruits and vegetables, and eat less fast food, canned, processed, or prepared foods. This information is not intended to replace advice given to you by your health care provider. Make sure you discuss any questions you have with your health care provider. Document Released: 11/25/2001 Document Revised: 05/18/2017  Document Reviewed: 05/29/2016 Elsevier Patient Education  [employer].

## 2019-01-08 ENCOUNTER — Telehealth: Payer: Self-pay

## 2019-01-08 ENCOUNTER — Ambulatory Visit: Payer: Medicare Other | Admitting: Nurse Practitioner

## 2019-01-08 ENCOUNTER — Ambulatory Visit: Payer: Medicare Other

## 2019-01-08 NOTE — Telephone Encounter (Signed)
This nurse called patient in order too follow up due to no show for AWV appointment. [name] answered and stated that she thought both his and her appointments had been rescheduled due to them being positive for the coronavirus. She would like to know where he is to get re-tested.

## 2019-01-17 DIAGNOSIS — J449 Chronic obstructive pulmonary disease, unspecified: Secondary | ICD-10-CM | POA: Diagnosis not present

## 2019-01-28 ENCOUNTER — Other Ambulatory Visit: Payer: Self-pay | Admitting: Internal Medicine

## 2019-01-29 ENCOUNTER — Ambulatory Visit: Payer: Medicare Other

## 2019-01-29 ENCOUNTER — Telehealth: Payer: Self-pay

## 2019-01-29 ENCOUNTER — Ambulatory Visit: Payer: Medicare Other | Admitting: Nurse Practitioner

## 2019-01-29 NOTE — Telephone Encounter (Signed)
This nurse attempted to call patient in regards to not showing up for appointment. Chad Moss answered the phone. She stated that they are in [place] due to a family emergency. They will call when they return to reschedule.

## 2019-02-04 NOTE — Telephone Encounter (Signed)
Thank you :)

## 2019-02-17 DIAGNOSIS — J449 Chronic obstructive pulmonary disease, unspecified: Secondary | ICD-10-CM | POA: Diagnosis not present

## 2019-03-15 DIAGNOSIS — R51 Headache: Secondary | ICD-10-CM | POA: Diagnosis not present

## 2019-03-17 ENCOUNTER — Other Ambulatory Visit: Payer: Self-pay

## 2019-03-17 DIAGNOSIS — Z1159 Encounter for screening for other viral diseases: Secondary | ICD-10-CM | POA: Diagnosis not present

## 2019-03-17 DIAGNOSIS — Z20822 Contact with and (suspected) exposure to covid-19: Secondary | ICD-10-CM

## 2019-03-18 LAB — NOVEL CORONAVIRUS, NAA: SARS-CoV-2, NAA: NOT DETECTED

## 2019-03-19 DIAGNOSIS — J449 Chronic obstructive pulmonary disease, unspecified: Secondary | ICD-10-CM | POA: Diagnosis not present

## 2019-03-31 ENCOUNTER — Other Ambulatory Visit: Payer: Self-pay | Admitting: Interventional Cardiology

## 2019-03-31 MED ORDER — NITROGLYCERIN 0.4 MG SL SUBL: 0.4000 mg | SUBLINGUAL_TABLET | SUBLINGUAL | 5 refills | Status: DC | PRN

## 2019-04-01 ENCOUNTER — Other Ambulatory Visit: Payer: Self-pay | Admitting: Interventional Cardiology

## 2019-04-01 MED ORDER — PANTOPRAZOLE SODIUM 40 MG PO TBEC: DELAYED_RELEASE_TABLET | ORAL | 2 refills | Status: DC

## 2019-04-19 DIAGNOSIS — J449 Chronic obstructive pulmonary disease, unspecified: Secondary | ICD-10-CM | POA: Diagnosis not present

## 2019-04-23 ENCOUNTER — Telehealth: Payer: Self-pay | Admitting: Internal Medicine

## 2019-04-23 NOTE — Telephone Encounter (Signed)
LMTCB at both numbers. 

## 2019-04-23 NOTE — Telephone Encounter (Signed)
If no answer give grandson a call at [phone]

## 2019-04-24 NOTE — Telephone Encounter (Signed)
Called and spoke to patient. Patient stated he has been dealing with a cold for over a week now. Patient denies fever. Patient reports that he has had sinus and chest congestion, productive cough (yellow/green) and has been taking Theraflu without any relief.  Patient wants an antibiotic prescribed.   Offered a Radiographer, therapeutic Visit, patient declined and main caregiver will not be available.  The home number can be called before 10 am at [phone] and if there isn't an answer Chad Moss (will be in the home and can pass cell phone to patient) his number is [phone]. Scheduled patient for 0930 telephone visit with NP. Nothing further needed at this time.

## 2019-04-24 NOTE — Telephone Encounter (Signed)
LMTCB x1 for pt.  

## 2019-04-24 NOTE — Telephone Encounter (Signed)
Pt calling back

## 2019-04-24 NOTE — Telephone Encounter (Signed)
[phone] calling back

## 2019-04-25 ENCOUNTER — Encounter: Payer: Self-pay | Admitting: Primary Care

## 2019-04-25 ENCOUNTER — Other Ambulatory Visit: Payer: Self-pay

## 2019-04-25 ENCOUNTER — Ambulatory Visit (INDEPENDENT_AMBULATORY_CARE_PROVIDER_SITE_OTHER): Payer: Medicare Other | Admitting: Primary Care

## 2019-04-25 DIAGNOSIS — J441 Chronic obstructive pulmonary disease with (acute) exacerbation: Secondary | ICD-10-CM

## 2019-04-25 MED ORDER — AZITHROMYCIN 250 MG PO TABS: ORAL_TABLET | ORAL | 0 refills | Status: DC

## 2019-04-25 MED ORDER — GUAIFENESIN ER 600 MG PO TB12: 600.0000 mg | ORAL_TABLET | Freq: Two times a day (BID) | ORAL | 0 refills | Status: DC

## 2019-04-25 MED ORDER — FLUTICASONE PROPIONATE 50 MCG/ACT NA SUSP: 1.0000 | Freq: Every day | NASAL | 0 refills | Status: DC

## 2019-04-25 MED ORDER — PREDNISONE 10 MG PO TABS: ORAL_TABLET | ORAL | 0 refills | Status: DC

## 2019-04-25 NOTE — Patient Instructions (Signed)
Pleasure speaking with you today Chad Moss, I'm sorry you are not feeling well. Treating you for suspected COPD exacerbation and sinusitis   COPD exacerbation: - RX zpack and prednisone taper (40mg  x 2 days; 30mg  x 2 days; 20mg  x 2 days; 10mg  x 2 days) - Advised mucinex 600mg  twice daily - Continue Spiriva HandiHaler; prn albuterol hfa/neb q 6 hours   Sinusitis - Flonase nasal spray daily

## 2019-04-25 NOTE — Progress Notes (Signed)
Virtual Visit via Telephone Note  I connected with Chad Moss on 04/25/19 at  9:30 AM EST by telephone and verified that I am speaking with the correct person using two identifiers.  Location: Patient: Home Provider: Office   I discussed the limitations, risks, security and privacy concerns of performing an evaluation and management service by telephone and the availability of in person appointments. I also discussed with the patient that there may be a patient responsible charge related to this service. The patient expressed understanding and agreed to proceed.   History of Present Illness: 63 year old male, current every day smoker. PMH significant for COPD, emphysema, allergic rhinitis, HTN, CAD, crohns disease. Patient of Dr. Chase Caller, last seen by pulmonary NP on 11/25/18 for follow-up COVID-19 virus.   04/25/2019 Patient contacted today for acute televisit. Reports chest congestion with productive cough with yellow mucus x 2 weeks. Associated PND, sinus congestion, wheezing. He has been using his nebulizer twice a day with improvement. No significant shortness of breath. He has been taking Theraflu and benadryl over the counter of his symptoms. Denies chills, sweats, chest tightness, N/V/D.    Observations/Objective:  - No significant shortness of breath, wheezing or cough  Significant testing reviewed: 10/03/2017-CT chest lung cancer screening- lung RADS 2, benign appearance, follow-up in 12 months  11/19/2018-CT chest with contrast- left lower lobe airspace opacity is noted consistent with pneumonia, 5 mm right middle lobe nodule is noted no follow-up needed patient is low risk, noncontrast chest CT can be considered in 12 months if patient is high risk  07/09/2014-pulmonary function test- FVC 3.71 (87% predicted), postbronchodilator ratio 78, postbronchodilator FEV1 2.78 (84% predicted), no bronchodilator response, DLCO 56  11/19/2018-SARS-CoV-2-positive  Assessment and  Plan:  COPD exacerbation - Congested cough x 2 weeks with purulent mucus and associated wheezing  - RX zpack and prednisone taper (40mg  x 2 days; 30mg  x 2 days; 20mg  x 2 days; 10mg  x 2 days) - Advised mucinex 600mg  twice daily - Continue Spiriva HandiHaler; prn albuterol hfa/neb q 6 hours   Sinusitis  - Flonase nasal spray daily   Follow Up Instructions:   - Return or call if symptoms do not improve or worsen  I discussed the assessment and treatment plan with the patient. The patient was provided an opportunity to ask questions and all were answered. The patient agreed with the plan and demonstrated an understanding of the instructions.   The patient was advised to call back or seek an in-person evaluation if the symptoms worsen or if the condition fails to improve as anticipated.  I provided 18 minutes of non-face-to-face time during this encounter.   Martyn Ehrich, NP

## 2019-04-30 ENCOUNTER — Other Ambulatory Visit: Payer: Self-pay

## 2019-04-30 ENCOUNTER — Ambulatory Visit (INDEPENDENT_AMBULATORY_CARE_PROVIDER_SITE_OTHER): Payer: Medicare Other

## 2019-04-30 ENCOUNTER — Encounter: Payer: Self-pay | Admitting: Nurse Practitioner

## 2019-04-30 ENCOUNTER — Ambulatory Visit (INDEPENDENT_AMBULATORY_CARE_PROVIDER_SITE_OTHER): Payer: Medicare Other | Admitting: Nurse Practitioner

## 2019-04-30 VITALS — BP 150/90 | HR 99 | Temp 98.8°F | Ht 71.0 in | Wt 220.6 lb

## 2019-04-30 VITALS — BP 150/90 | HR 99 | Temp 98.8°F | Ht 71.0 in | Wt 220.0 lb

## 2019-04-30 DIAGNOSIS — I25119 Atherosclerotic heart disease of native coronary artery with unspecified angina pectoris: Secondary | ICD-10-CM

## 2019-04-30 DIAGNOSIS — E782 Mixed hyperlipidemia: Secondary | ICD-10-CM

## 2019-04-30 DIAGNOSIS — Z720 Tobacco use: Secondary | ICD-10-CM

## 2019-04-30 DIAGNOSIS — Z0000 Encounter for general adult medical examination without abnormal findings: Secondary | ICD-10-CM | POA: Diagnosis not present

## 2019-04-30 DIAGNOSIS — R7303 Prediabetes: Secondary | ICD-10-CM | POA: Diagnosis not present

## 2019-04-30 DIAGNOSIS — J441 Chronic obstructive pulmonary disease with (acute) exacerbation: Secondary | ICD-10-CM

## 2019-04-30 DIAGNOSIS — K5090 Crohn's disease, unspecified, without complications: Secondary | ICD-10-CM

## 2019-04-30 DIAGNOSIS — I739 Peripheral vascular disease, unspecified: Secondary | ICD-10-CM | POA: Diagnosis not present

## 2019-04-30 DIAGNOSIS — R7309 Other abnormal glucose: Secondary | ICD-10-CM | POA: Diagnosis not present

## 2019-04-30 DIAGNOSIS — I10 Essential (primary) hypertension: Secondary | ICD-10-CM

## 2019-04-30 LAB — POCT URINALYSIS DIPSTICK
Bilirubin, UA: NEGATIVE
Glucose, UA: NEGATIVE
Ketones, UA: NEGATIVE
Leukocytes, UA: NEGATIVE
Nitrite, UA: NEGATIVE
Protein, UA: NEGATIVE
Spec Grav, UA: 1.01 (ref 1.010–1.025)
Urobilinogen, UA: 0.2 E.U./dL
pH, UA: 6 (ref 5.0–8.0)

## 2019-04-30 LAB — POCT UA - MICROALBUMIN
Albumin/Creatinine Ratio, Urine, POC: <30
Creatinine, POC: 100 mg/dL
Microalbumin Ur, POC: 10 mg/L

## 2019-04-30 MED ORDER — BENZONATATE 100 MG PO CAPS: 100.0000 mg | ORAL_CAPSULE | Freq: Four times a day (QID) | ORAL | 1 refills | Status: DC | PRN

## 2019-04-30 NOTE — Progress Notes (Signed)
Subjective:   Chad Moss is a 63 y.o. male who presents for Medicare Annual/Subsequent preventive examination.  Review of Systems:  n/a Cardiac Risk Factors include: advanced age (>59mn, >>11women);male gender;hypertension;obesity (BMI >30kg/m2);sedentary lifestyle;smoking/ tobacco exposure     Objective:    Vitals: BP (!) 150/90 (BP Location: Left Arm, Patient Position: Sitting, Cuff Size: Normal)    Pulse 99    Temp 98.8 F (37.1 C) (Oral)    Ht 5' 11"  (1.803 m)    Wt 220 lb 9.6 oz (100.1 kg)    BMI 30.77 kg/m   Body mass index is 30.77 kg/m.  Advanced Directives 04/30/2019 11/19/2018 03/28/2018 07/12/2017 05/29/2017 03/13/2017 06/28/2016  Does Patient Have a Medical Advance Directive? Yes No Yes No No Yes Yes  Type of Advance Directive Healthcare Power of AMcCleary Does patient want to make changes to medical advance directive? - - No - Patient declined - - No - Patient declined -  Copy of H[place]acein Chart? No - copy requested - No - copy requested - - No - copy requested No - copy requested  Would patient like information on creating a medical advance directive? - No - Guardian declined - - No - Patient declined - No - Patient declined    Tobacco Social History   Tobacco Use  Smoking Status Current Every Day Smoker   Packs/day: 1.00   Years: 50.00   Pack years: 50.00   Types: Cigarettes   Start date: 11/18/1968  Smokeless Tobacco Never Used  Tobacco Comment   currently smoking 1ppd     Ready to quit: No Counseling given: Yes Comment: currently smoking 1ppd   Clinical Intake:  Pre-visit preparation completed: Yes  Pain : No/denies pain     Nutritional Status: BMI > 30  Obese Nutritional Risks: None Diabetes: No  How often do you need to have someone help you when you read instructions, pamphlets, or other written materials from your doctor  or pharmacy?: 1 - Never What is the last grade level you completed in school?: 12th grade  Interpreter Needed?: No  Information entered by :: NAllen LPN  Past Medical History:  Diagnosis Date   Allergic rhinitis    Anxiety    Atelectasis    CAD (coronary artery disease), native coronary artery    9/18 PCI/DES x1 to mLCx, mild diffuse nonobstructive disease, EF 55% on Lv gram   Chronic bronchitis (HCC)    COPD [place] obstructive [place] disease) [place]    Crohn's disease ([facility]    Depression    DVT (deep venous thrombosis) ([facility]    RLE X 2   Dysphagia    Dyspnea    Enlarged prostate    GERD (gastroesophageal reflux disease)    Glaucoma, both eyes    Heart murmur    History of stomach ulcers 1980s   Hyperlipidemia    Hypertension    "off RX for years now cause of coughing w/Lisinopril" (03/13/2017)   IBS (irritable bowel syndrome)    Paranoid schizophrenia ([facility]    Peripheral vascular disease ([facility]    Pneumonia 1990s   Pre-diabetes    "one time" (03/13/2017)   Stroke ([facility] 04/2016   "eye stroke; left eye" (03/13/2017)   Syncopal episodes    Past Surgical History:  [place]cedure [place]ality Date   [place]   "  part of my ileum removed"   CORONARY ANGIOPLASTY WITH STENT PLACEMENT  03/13/2017   CORONARY STENT INTERVENTION N/A 03/13/2017   Procedure: CORONARY STENT INTERVENTION;  Surgeon: Jettie Booze, MD;  Location: [facility];  Service: Cardiovascular;  Laterality: N/A;   EYE SURGERY Right    "laser; for glaucoma" (03/13/2017)   FASCIOTOMY Right    HERNIA REPAIR     LAPAROSCOPIC CHOLECYSTECTOMY     LEFT HEART CATH AND CORONARY ANGIOGRAPHY N/A 03/13/2017   Procedure: LEFT HEART CATH AND CORONARY ANGIOGRAPHY;  Surgeon: Jettie Booze, MD;  Location: [facility];  Service: Cardiovascular;  Laterality: N/A;   PENILE PROSTHESIS IMPLANT  01/2011   Archie Endo 02/01/2011     PERIPHERAL VASCULAR CATHETERIZATION Right 1999   "had blood clots in my leg; went in to open them up; dye went into leg; ended up having a fasiotomy"   TONSILLECTOMY     Family History  Problem Relation Age of Onset   Hypertension Mother    Heart disease Mother    Hypertension Father    Heart disease Father    Lung cancer Brother        2006   Social History   Socioeconomic History   Marital status: Single    Spouse name: Not on file   Number of children: Not on file   Years of education: Not on file   Highest education level: Not on file  Occupational History   Occupation: disabled  Social Designer, fashion/clothing strain: Not very hard   Food insecurity    Worry: Sometimes true    Inability: Sometimes true   Transportation needs    Medical: No    Non-medical: No  Tobacco Use   Smoking status: Current Every Day Smoker    Packs/day: 1.00    Years: 50.00    Pack years: 50.00    Types: Cigarettes    Start date: 11/18/1968   Smokeless tobacco: Never Used   Tobacco comment: currently smoking 1ppd  Substance and Sexual Activity   Alcohol use: No    Alcohol/week: 0.0 standard drinks   Drug use: No   Sexual activity: Not Currently    Birth control/protection: Condom  Lifestyle   Physical activity    Days per week: 4 days    Minutes per session: 20 min   Stress: Not at all  Relationships   Social connections    Talks on phone: Not on file    Gets together: Not on file    Attends religious service: Not on file    Active member of club or organization: Not on file    Attends meetings of clubs or organizations: Not on file    Relationship status: Not on file  Other Topics Concern   Not on file  Social History Narrative   Not on file    Outpatient Encounter Medications as of 04/30/2019  Medication Sig   albuterol (PROAIR HFA) 108 (90 Base) MCG/ACT inhaler INHALE 2 PUFFS BY MOUTH INTO THE LUNGS EVERY 6 HOURS AS NEEDED FOR WHEEZING OR  SHORTNESS OF BREATH   albuterol (PROVENTIL) (2.5 MG/3ML) 0.083% nebulizer solution Take 3 mLs (2.5 mg total) by nebulization every 6 (six) hours as needed for wheezing or shortness of breath.   alfuzosin (UROXATRAL) 10 MG 24 hr tablet Take 10 mg by mouth at bedtime.    amLODipine (NORVASC) 5 MG tablet TAKE 1 TABLET(5 MG) BY MOUTH DAILY   aspirin EC 81 MG  tablet Take 81 mg by mouth daily.   bimatoprost (LUMIGAN) 0.01 % SOLN Place 1 drop into the left eye at bedtime.   brimonidine (ALPHAGAN) 0.15 % ophthalmic solution Place 1 drop into the left eye 2 (two) times daily.    cholestyramine (QUESTRAN) 4 G packet Take 1 packet by mouth daily.    clonazePAM (KLONOPIN) 1 MG tablet Take 1 mg by mouth at bedtime. Anxiety   clopidogrel (PLAVIX) 75 MG tablet Take 75 mg by mouth daily.   dicyclomine (BENTYL) 10 MG capsule Take 10 mg by mouth 4 (four) times daily -  before meals and at bedtime.    diphenhydrAMINE (BENADRYL) 25 MG tablet Take 50 mg by mouth at bedtime.    finasteride (PROSCAR) 5 MG tablet Take 5 mg by mouth daily.   fluticasone (FLONASE) 50 MCG/ACT nasal spray Place 1 spray into both nostrils daily.   furosemide (LASIX) 40 MG tablet Take 1 tablet 2 times a week   isosorbide mononitrate (IMDUR) 30 MG 24 hr tablet TAKE 1 TABLET(30 MG) BY MOUTH DAILY   loxapine (LOXITANE) 10 MG capsule Take 10 mg by mouth 2 (two) times daily. 1 tab in AM and 2 tabs in PM   mercaptopurine (PURINETHOL) 50 MG tablet Take 75 mg by mouth daily. 1 tablet and a half tablet Give on an empty stomach 1 hour before or 2 hours after meals. Caution: Chemotherapy.   mesalamine (PENTASA) 250 MG CR capsule Take 1,000 mg by mouth 4 (four) times daily.    methocarbamol (ROBAXIN) 500 MG tablet Take 1 tablet (500 mg total) by mouth 2 (two) times daily.   mirtazapine (REMERON) 30 MG tablet Take 45 mg by mouth at bedtime. 1.5 tablets by mouth daily   nitroGLYCERIN (NITROSTAT) 0.4 MG SL tablet Place 1 tablet (0.4  mg total) under the tongue every 5 (five) minutes x 3 doses as needed for chest pain.   pantoprazole (PROTONIX) 40 MG tablet TAKE 1 TABLET(40 MG) BY MOUTH TWICE DAILY   potassium chloride (K-DUR) 10 MEQ tablet Take 1 tablet (10 mEq total) by mouth 2 (two) times daily.   predniSONE (DELTASONE) 10 MG tablet Take 4 tabs po daily x 2 days; then 3 tabs for 2 days; then 2 tabs for 2 days; then 1 tab for 2 days   rosuvastatin (CRESTOR) 20 MG tablet TAKE 1 TABLET(20 MG) BY MOUTH DAILY   tamsulosin (FLOMAX) 0.4 MG CAPS capsule TK 1 C PO HS   timolol (TIMOPTIC) 0.5 % ophthalmic solution Place 1 drop into the left eye 2 (two) times daily.   tiotropium (SPIRIVA HANDIHALER) 18 MCG inhalation capsule INHALE THE CONTENT OF 1 CAPSULE VIA HANDIHALER DAILY   traZODone (DESYREL) 50 MG tablet Take 150 mg by mouth at bedtime.   fesoterodine (TOVIAZ) 4 MG TB24 tablet Take 4 mg by mouth daily.   guaiFENesin (MUCINEX) 600 MG 12 hr tablet Take 1 tablet (600 mg total) by mouth 2 (two) times daily. (Patient not taking: Reported on 04/30/2019)   nicotine (NICODERM CQ) 21 mg/24hr patch Place 1 patch (21 mg total) onto the skin daily. (Patient not taking: Reported on 04/30/2019)   nicotine polacrilex (COMMIT) 4 MG lozenge Take 1 lozenge (4 mg total) by mouth as needed for smoking cessation. (Patient not taking: Reported on 04/30/2019)   [DISCONTINUED] azithromycin (ZITHROMAX) 250 MG tablet Zpack taper as directed for COPD exacerbation (Patient not taking: Reported on 04/30/2019)   [DISCONTINUED] Phenylephrine-Pheniramine-DM (THERAFLU COLD & COUGH PO) Take by mouth every  4 (four) hours.   No facility-administered encounter medications on file as of 04/30/2019.     Activities of Daily Living In your present state of health, do you have any difficulty performing the following activities: 04/30/2019  Hearing? N  Vision? Y  Comment blurry  Difficulty concentrating or making decisions? N  Walking or climbing  stairs? N  Dressing or bathing? N  Doing errands, shopping? N  Preparing Food and eating ? N  Using the Toilet? N  In the past six months, have you accidently leaked urine? N  Do you have problems with loss of bowel control? N  Managing your Medications? Y  Comment friend sets up the medication box  Managing your Finances? N  Housekeeping or managing your Housekeeping? N  Some recent data might be hidden    Patient Care Team: [name], FNP as PCP - General ([place]) Jettie Booze, MD as PCP - Cardiology (Cardiology)   Assessment:   This is a routine wellness examination for [place].  Exercise Activities and Dietary recommendations Current Exercise Habits: The patient does not participate in regular exercise at present  Goals     Patient Stated     04/30/2019, no goals       Fall Risk Fall Risk  04/30/2019 11/25/2018 10/09/2018 07/05/2018 03/28/2018  Falls in the past year? 0 0 0 0 No  Number falls in past yr: 0 - - - -  Risk for fall due to : Medication side effect - - - Medication side effect  Follow up Falls evaluation completed;Education provided;Falls prevention discussed - - - -   Is the patient's home free of loose throw rugs in walkways, pet beds, electrical cords, etc?   yes      Grab bars in the bathroom? yes      Handrails on the stairs?   n/a      Adequate lighting?   yes  Timed Get Up and Go Performed: n/a  Depression Screen PHQ 2/9 Scores 04/30/2019 11/25/2018 10/09/2018 07/05/2018  PHQ - 2 Score 0 0 0 4  PHQ- 9 Score 0 - - 13    Cognitive Function     6CIT Screen 04/30/2019 03/28/2018  What Year? 0 points 0 points  What month? 0 points 0 points  What time? 0 points 0 points  Count back from 20 0 points 2 points  Months in reverse 4 points 0 points  Repeat phrase 0 points 0 points  Total Score 4 2    Immunization History  Administered Date(s) Administered   Influenza Split 04/06/2011, 03/12/2012, 03/31/2015   Influenza Whole  05/10/2009   Influenza,inj,Quad PF,6+ Mos 03/04/2013, 04/02/2017, 03/28/2018   Influenza-Unspecified 04/19/2014   Pneumococcal Conjugate-13 01/27/2009   Pneumococcal Polysaccharide-23 06/19/2008   Tdap 12/27/2017    Qualifies for Shingles Vaccine? yes  Screening Tests Health Maintenance  Topic Date Due   HIV Screening  10/13/1970   INFLUENZA VACCINE  09/17/2019 (Originally 01/18/2019)   PNA vac Low Risk Adult (2 of 2 - PPSV23) 04/29/2020 (Originally 10/14/2018)   COLONOSCOPY  03/25/2024   TETANUS/TDAP  12/28/2027   Hepatitis C Screening  Completed   Cancer Screenings: Lung: Low Dose CT Chest recommended if Age 48-80 years, 30 pack-year currently smoking OR have quit w/in 15years. Patient does qualify. Colorectal: up to date  Additional Screenings:  Hepatitis C Screening:09/2013      Plan:    Patient does not have any set goals at this time. We discussed quitting smoking, but  he is not ready at this time.  I have personally reviewed and noted the following in the patients chart:    Medical and social history  Use of alcohol, tobacco or illicit drugs   Current medications and supplements  Functional ability and status  Nutritional status  Physical activity  Advanced directives  List of other physicians  Hospitalizations, surgeries, and ER visits in previous 12 months  Vitals  Screenings to include cognitive, depression, and falls  Referrals and appointments  In addition, I have reviewed and discussed with patient certain preventive protocols, quality metrics, and best practice recommendations. A written personalized care plan for preventive services as well as general preventive health recommendations were provided to patient.     Kellie Simmering, LPN  00/10/565

## 2019-04-30 NOTE — Patient Instructions (Addendum)
Mr. Zisk , Thank you for taking time to come for your Medicare Wellness Visit. I appreciate your ongoing commitment to your health goals. Please review the following plan we discussed and let me know if I can assist you in the future.   Screening recommendations/referrals: Colonoscopy: 03/2014 Recommended yearly ophthalmology/optometry visit for glaucoma screening and checkup Recommended yearly dental visit for hygiene and checkup  Vaccinations: Influenza vaccine: declined Pneumococcal vaccine: declined Tdap vaccine: 12/2017 Shingles vaccine: discussed    Advanced directives: Please bring a copy of your POA (Power of Attorney) and/or Living Will to your next appointment.    Conditions/risks identified: obesity, smoking  Next appointment: 08/11/2019 at 3:00   Smoking and Musculoskeletal Health Smoking is bad for your health. Most people know that smoking causes lung disease, heart disease, and cancer. But people may not realize that it also affects their bones, muscles, and joints (musculoskeletal system). When you smoke, the effects on your lungs and heart result in less oxygen for your musculoskeletal system. This can lead to poor bone and joint health. How can smoking affect my musculoskeletal health? Smoking can:  Increase your risk of having weak, thin bones (osteoporosis). Elderly smokers are at higher risk for bone fractures related to osteoporosis.  Decrease the ability of bone-forming cells to make and replace bone (in addition to reducing oxygen and blood flow).  Reduce your body's ability to absorb calcium from your diet. Less calcium means weaker bones.  Interfere with the breakdown of the male hormone estrogen. Smoking lowers estrogen, which is a hormone that helps keep bones strong. Women who smoke may have earlier menopause. Menopause is a risk factor for osteoporosis.  Weaken the tissues that attach bones to muscles (tendons). This can lead to shoulder, back, and  other joint injuries.  Increase your risk of rheumatoid arthritis or make the condition worse if you already have it.  Slow down healing and increase your risk of infection and other complications if you have a bone fracture or surgery that involves your musculoskeletal system.  Make you get out of breath easily. This can keep you from getting the exercise you need to keep your bones and joints healthy.  Decrease your appetite and body mass. You may lose weight and muscle strength. This can put you at higher risk for muscle injury, joint injury, and broken bones. What actions can I take to prevent musculoskeletal problems? Quit smoking      Do not start smoking. Quit if you already do. Even stopping later in life can improve musculoskeletal health.  Do not use any products that contain nicotine or tobacco. Do not replace cigarette smoking with e-cigarettes. The safety of e-cigarettes is not known, and some may contain harmful chemicals.  Make a plan to quit smoking and commit to it. Look for programs to help you, and ask your health care provider for recommendations and ideas.  Talk with your health care provider about using nicotine replacement medicines to help you quit, such as gum, lozenges, patches, sprays, or pills. Make other lifestyle changes   Eat a healthy diet that includes calcium and vitamin D. These nutrients are important for bone health. ? Calcium is found in dairy foods and green leafy vegetables. ? Vitamin D is found in eggs, fish, and liver. ? Many foods also have vitamin D and calcium added to them (are fortified). ? Ask your health care provider if you would benefit from taking a supplement.  Get out in the sunshine for a short  time every day. This increases production of vitamin D.  Get 30 minutes of exercise at least 5 days a week. Weight-bearing and strength exercises are best for musculoskeletal health. Ask your health care provider what type of exercise is  safe for you.  Do not drink alcohol if: ? Your health care provider tells you not to drink. ? You are pregnant, may be pregnant, or are planning to become pregnant.  If you drink alcohol, limit how much you have: ? 0-1 drink a day for women. ? 0-2 drinks a day for men.  Be aware of how much alcohol is in your drink. In the [place] one drink equals one 12 oz bottle of beer (355 mL), one 5 oz glass of wine (148 mL), or one 1 oz glass of hard liquor (44 mL). Where to find more information You may find more information about smoking, musculoskeletal health, and quitting smoking from:  American Academy of Orthopaedic Surgeons: orthoinfo.aaos.org  [employer] of [employer], Osteoporosis and Related Bone Diseases [employer]: bones.http://www.myers.net/  HelpGuide.org: helpguide.org  [employer]: smokefree.gov  American Lung Association: lung.org Contact a health care provider if:  You need help to quit smoking. Summary  When you smoke, the effects on your lungs and heart result in less oxygen for your musculoskeletal system.  Even stopping smoking later in life can improve musculoskeletal health.  Do not use any products that contain nicotine or tobacco, such as cigarettes and e-cigarettes.  If you need help quitting, ask your health care provider. This information is not intended to replace advice given to you by your health care provider. Make sure you discuss any questions you have with your health care provider. Document Released: 10/01/2017 Document Revised: 10/01/2017 Document Reviewed: 10/01/2017 Elsevier Patient Education  2020 [employer].      Preventive Care 65 Years and Older, Male Preventive care refers to lifestyle choices and visits with your health care provider that can promote health and wellness. What does preventive care include?  A yearly physical exam. This is also called an annual well check.  Dental exams once or twice a year.  Routine eye  exams. Ask your health care provider how often you should have your eyes checked.  Personal lifestyle choices, including:  Daily care of your teeth and gums.  Regular physical activity.  Eating a healthy diet.  Avoiding tobacco and drug use.  Limiting alcohol use.  Practicing safe sex.  Taking low doses of aspirin every day.  Taking vitamin and mineral supplements as recommended by your health care provider. What happens during an annual well check? The services and screenings done by your health care provider during your annual well check will depend on your age, overall health, lifestyle risk factors, and family history of disease. Counseling  Your health care provider may ask you questions about your:  Alcohol use.  Tobacco use.  Drug use.  Emotional well-being.  Home and relationship well-being.  Sexual activity.  Eating habits.  History of falls.  Memory and ability to understand (cognition).  Work and work Astronomer. Screening  You may have the following tests or measurements:  Height, weight, and BMI.  Blood pressure.  Lipid and cholesterol levels. These may be checked every 5 years, or more frequently if you are over 43 years old.  Skin check.  Lung cancer screening. You may have this screening every year starting at age 24 if you have a 30-pack-year history of smoking and currently smoke or have quit within the  past 15 years.  Fecal occult blood test (FOBT) of the stool. You may have this test every year starting at age 20.  Flexible sigmoidoscopy or colonoscopy. You may have a sigmoidoscopy every 5 years or a colonoscopy every 10 years starting at age 45.  Prostate cancer screening. Recommendations will vary depending on your family history and other risks.  Hepatitis C blood test.  Hepatitis B blood test.  Sexually transmitted disease (STD) testing.  Diabetes screening. This is done by checking your blood sugar (glucose) after you have  not eaten for a while (fasting). You may have this done every 1-3 years.  Abdominal aortic aneurysm (AAA) screening. You may need this if you are a current or former smoker.  Osteoporosis. You may be screened starting at age 6 if you are at high risk. Talk with your health care provider about your test results, treatment options, and if necessary, the need for more tests. Vaccines  Your health care provider may recommend certain vaccines, such as:  Influenza vaccine. This is recommended every year.  Tetanus, diphtheria, and acellular pertussis (Tdap, Td) vaccine. You may need a Td booster every 10 years.  Zoster vaccine. You may need this after age 55.  Pneumococcal 13-valent conjugate (PCV13) vaccine. One dose is recommended after age 45.  Pneumococcal polysaccharide (PPSV23) vaccine. One dose is recommended after age 68. Talk to your health care provider about which screenings and vaccines you need and how often you need them. This information is not intended to replace advice given to you by your health care provider. Make sure you discuss any questions you have with your health care provider. Document Released: 07/02/2015 Document Revised: 02/23/2016 Document Reviewed: 04/06/2015 Elsevier Interactive Patient Education  2017 [place] Prevention in the Home Falls can cause injuries. They can happen to people of all ages. There are many things you can do to make your home safe and to help prevent falls. What can I do on the outside of my home?  Regularly fix the edges of walkways and driveways and fix any cracks.  Remove anything that might make you trip as you walk through a door, such as a raised step or threshold.  Trim any bushes or trees on the path to your home.  Use bright outdoor lighting.  Clear any walking paths of anything that might make someone trip, such as rocks or tools.  Regularly check to see if handrails are loose or broken. Make sure that both  sides of any steps have handrails.  Any raised decks and porches should have guardrails on the edges.  Have any leaves, snow, or ice cleared regularly.  Use sand or salt on walking paths during winter.  Clean up any spills in your garage right away. This includes oil or grease spills. What can I do in the bathroom?  Use night lights.  Install grab bars by the toilet and in the tub and shower. Do not use towel bars as grab bars.  Use non-skid mats or decals in the tub or shower.  If you need to sit down in the shower, use a plastic, non-slip stool.  Keep the floor dry. Clean up any water that spills on the floor as soon as it happens.  Remove soap buildup in the tub or shower regularly.  Attach bath mats securely with double-sided non-slip rug tape.  Do not have throw rugs and other things on the floor that can make you trip. What can I do in  the bedroom?  Use night lights.  Make sure that you have a light by your bed that is easy to reach.  Do not use any sheets or blankets that are too big for your bed. They should not hang down onto the floor.  Have a firm chair that has side arms. You can use this for support while you get dressed.  Do not have throw rugs and other things on the floor that can make you trip. What can I do in the kitchen?  Clean up any spills right away.  Avoid walking on wet floors.  Keep items that you use a lot in easy-to-reach places.  If you need to reach something above you, use a strong step stool that has a grab bar.  Keep electrical cords out of the way.  Do not use floor polish or wax that makes floors slippery. If you must use wax, use non-skid floor wax.  Do not have throw rugs and other things on the floor that can make you trip. What can I do with my stairs?  Do not leave any items on the stairs.  Make sure that there are handrails on both sides of the stairs and use them. Fix handrails that are broken or loose. Make sure that  handrails are as long as the stairways.  Check any carpeting to make sure that it is firmly attached to the stairs. Fix any carpet that is loose or worn.  Avoid having throw rugs at the top or bottom of the stairs. If you do have throw rugs, attach them to the floor with carpet tape.  Make sure that you have a light switch at the top of the stairs and the bottom of the stairs. If you do not have them, ask someone to add them for you. What else can I do to help prevent falls?  Wear shoes that:  Do not have high heels.  Have rubber bottoms.  Are comfortable and fit you well.  Are closed at the toe. Do not wear sandals.  If you use a stepladder:  Make sure that it is fully opened. Do not climb a closed stepladder.  Make sure that both sides of the stepladder are locked into place.  Ask someone to hold it for you, if possible.  Clearly mark and make sure that you can see:  Any grab bars or handrails.  First and last steps.  Where the edge of each step is.  Use tools that help you move around (mobility aids) if they are needed. These include:  Canes.  Walkers.  Scooters.  Crutches.  Turn on the lights when you go into a dark area. Replace any light bulbs as soon as they burn out.  Set up your furniture so you have a clear path. Avoid moving your furniture around.  If any of your floors are uneven, fix them.  If there are any pets around you, be aware of where they are.  Review your medicines with your doctor. Some medicines can make you feel dizzy. This can increase your chance of falling. Ask your doctor what other things that you can do to help prevent falls. This information is not intended to replace advice given to you by your health care provider. Make sure you discuss any questions you have with your health care provider. Document Released: 04/01/2009 Document Revised: 11/11/2015 Document Reviewed: 07/10/2014 Elsevier Interactive Patient Education  2017  [employer]

## 2019-04-30 NOTE — Progress Notes (Signed)
Subjective:     Patient ID: Chad Moss , male    DOB: 17-Jan-1956 , 63 y.o.   MRN: [id number]   Chief Complaint  Patient presents with  . prediabetes  . Hypertension    HPI  Cold symptoms - he has been taking robitussin, prednisone and azithromycin. Has been sick for 2 weeks given by his pulmonary doctor.    Diabetes He presents for his follow-up diabetic visit. Diabetes type: prediabetes. There are no hypoglycemic associated symptoms. There are no diabetic associated symptoms. There are no hypoglycemic complications. Symptoms are stable. There are no diabetic complications. Risk factors for coronary artery disease include male sex and sedentary lifestyle. Current diabetic treatment includes oral agent (monotherapy). He is compliant with treatment most of the time. His weight is stable. When asked about meal planning, he reported none. He rarely participates in exercise. (Not checking regularly) He does not see a podiatrist.Eye exam is current.  Hypertension This is a chronic problem. The current episode started more than 1 year ago. The problem has been waxing and waning since onset. The problem is controlled. Pertinent negatives include no anxiety. There are no associated agents to hypertension. Risk factors for coronary artery disease include obesity and sedentary lifestyle. Past treatments include calcium channel blockers and diuretics. There are no compliance problems.  There is no history of angina. There is no history of chronic renal disease.     Past Medical History:  Diagnosis Date  . Allergic rhinitis   . Anxiety   . Atelectasis   . CAD (coronary artery disease), native coronary artery    9/18 PCI/DES x1 to mLCx, mild diffuse nonobstructive disease, EF 55% on Lv gram  . Chronic bronchitis ([place])   . COPD (chronic obstructive pulmonary disease) ([place])   . Crohn's disease ([place])   . Depression   . DVT (deep venous thrombosis) (H[facility])    RLE X 2  . Dysphagia   . Dyspnea   .  Enlarged prostate   . GERD (gastroesophageal reflux disease)   . Glaucoma, both eyes   . Heart murmur   . History of stomach ulcers 1980s  . Hyperlipidemia   . Hypertension    "off RX for years now cause of coughing w/Lisinopril" (03/13/2017)  . IBS (irritable bowel syndrome)   . Paranoid schizophrenia ([place])   . Peripheral vascular disease ([place])   . Pneumonia 1990s  . Pre-diabetes    "one time" (03/13/2017)  . Stroke [facility]) 04/2016   "eye stroke; left eye" (03/13/2017)  . Syncopal episodes      Family History  Problem Relation Age of Onset  . Hypertension Mother   . Heart disease Mother   . Hypertension Father   . Heart disease Father   . Lung cancer Brother        2006     Current Outpatient Medications:  .  albuterol (PROAIR HFA) 108 (90 Base) MCG/ACT inhaler, INHALE 2 PUFFS BY MOUTH INTO THE LUNGS EVERY 6 HOURS AS NEEDED FOR WHEEZING OR SHORTNESS OF BREATH, Disp: 8.5 g, Rfl: 5 .  albuterol (PROVENTIL) (2.5 MG/3ML) 0.083% nebulizer solution, Take 3 mLs (2.5 mg total) by nebulization every 6 (six) hours as needed for wheezing or shortness of breath., Disp: 300 mL, Rfl: 5 .  alfuzosin (UROXATRAL) 10 MG 24 hr tablet, Take 10 mg by mouth at bedtime. , Disp: , Rfl:  .  amLODipine (NORVASC) 5 MG tablet, TAKE 1 TABLET(5 MG) BY MOUTH DAILY, Disp: 90 tablet, Rfl: 3 .  aspirin EC 81 MG tablet, Take 81 mg by mouth daily., Disp: , Rfl:  .  azithromycin (ZITHROMAX) 250 MG tablet, Zpack taper as directed for COPD exacerbation (Patient not taking: Reported on 04/30/2019), Disp: 6 tablet, Rfl: 0 .  bimatoprost (LUMIGAN) 0.01 % SOLN, Place 1 drop into the left eye at bedtime., Disp: , Rfl:  .  brimonidine (ALPHAGAN) 0.15 % ophthalmic solution, Place 1 drop into the left eye 2 (two) times daily. , Disp: , Rfl:  .  cholestyramine (QUESTRAN) 4 G packet, Take 1 packet by mouth daily. , Disp: , Rfl:  .  clonazePAM (KLONOPIN) 1 MG tablet, Take 1 mg by mouth at bedtime. Anxiety, Disp: , Rfl:  .   clopidogrel (PLAVIX) 75 MG tablet, Take 75 mg by mouth daily., Disp: , Rfl:  .  dicyclomine (BENTYL) 10 MG capsule, Take 10 mg by mouth 4 (four) times daily -  before meals and at bedtime. , Disp: , Rfl:  .  diphenhydrAMINE (BENADRYL) 25 MG tablet, Take 50 mg by mouth at bedtime. , Disp: , Rfl:  .  fesoterodine (TOVIAZ) 4 MG TB24 tablet, Take 4 mg by mouth daily., Disp: , Rfl:  .  finasteride (PROSCAR) 5 MG tablet, Take 5 mg by mouth daily., Disp: , Rfl:  .  fluticasone (FLONASE) 50 MCG/ACT nasal spray, Place 1 spray into both nostrils daily., Disp: 9.9 mL, Rfl: 0 .  furosemide (LASIX) 40 MG tablet, Take 1 tablet 2 times a week, Disp: 90 tablet, Rfl: 3 .  guaiFENesin (MUCINEX) 600 MG 12 hr tablet, Take 1 tablet (600 mg total) by mouth 2 (two) times daily. (Patient not taking: Reported on 04/30/2019), Disp: 30 tablet, Rfl: 0 .  isosorbide mononitrate (IMDUR) 30 MG 24 hr tablet, TAKE 1 TABLET(30 MG) BY MOUTH DAILY, Disp: 90 tablet, Rfl: 3 .  loxapine (LOXITANE) 10 MG capsule, Take 10 mg by mouth 2 (two) times daily. 1 tab in AM and 2 tabs in PM, Disp: , Rfl:  .  mercaptopurine (PURINETHOL) 50 MG tablet, Take 75 mg by mouth daily. 1 tablet and a half tablet Give on an empty stomach 1 hour before or 2 hours after meals. Caution: Chemotherapy., Disp: , Rfl:  .  mesalamine (PENTASA) 250 MG CR capsule, Take 1,000 mg by mouth 4 (four) times daily. , Disp: , Rfl:  .  methocarbamol (ROBAXIN) 500 MG tablet, Take 1 tablet (500 mg total) by mouth 2 (two) times daily., Disp: 20 tablet, Rfl: 0 .  mirtazapine (REMERON) 30 MG tablet, Take 45 mg by mouth at bedtime. 1.5 tablets by mouth daily, Disp: , Rfl:  .  nicotine (NICODERM CQ) 21 mg/24hr patch, Place 1 patch (21 mg total) onto the skin daily. (Patient not taking: Reported on 04/30/2019), Disp: 28 patch, Rfl: 3 .  nicotine polacrilex (COMMIT) 4 MG lozenge, Take 1 lozenge (4 mg total) by mouth as needed for smoking cessation. (Patient not taking: Reported on  04/30/2019), Disp: 100 tablet, Rfl: 3 .  nitroGLYCERIN (NITROSTAT) 0.4 MG SL tablet, Place 1 tablet (0.4 mg total) under the tongue every 5 (five) minutes x 3 doses as needed for chest pain., Disp: 25 tablet, Rfl: 5 .  pantoprazole (PROTONIX) 40 MG tablet, TAKE 1 TABLET(40 MG) BY MOUTH TWICE DAILY, Disp: 180 tablet, Rfl: 2 .  Phenylephrine-Pheniramine-DM (THERAFLU COLD & COUGH PO), Take by mouth every 4 (four) hours., Disp: , Rfl:  .  potassium chloride (K-DUR) 10 MEQ tablet, Take 1 tablet (10 mEq total) by  mouth 2 (two) times daily., Disp: 180 tablet, Rfl: 3 .  predniSONE (DELTASONE) 10 MG tablet, Take 4 tabs po daily x 2 days; then 3 tabs for 2 days; then 2 tabs for 2 days; then 1 tab for 2 days, Disp: 20 tablet, Rfl: 0 .  rosuvastatin (CRESTOR) 20 MG tablet, TAKE 1 TABLET(20 MG) BY MOUTH DAILY, Disp: 90 tablet, Rfl: 3 .  tamsulosin (FLOMAX) 0.4 MG CAPS capsule, TK 1 C PO HS, Disp: , Rfl:  .  timolol (TIMOPTIC) 0.5 % ophthalmic solution, Place 1 drop into the left eye 2 (two) times daily., Disp: , Rfl:  .  tiotropium (SPIRIVA HANDIHALER) 18 MCG inhalation capsule, INHALE THE CONTENT OF 1 CAPSULE VIA HANDIHALER DAILY, Disp: 30 capsule, Rfl: 5 .  traZODone (DESYREL) 50 MG tablet, Take 150 mg by mouth at bedtime., Disp: , Rfl:    Allergies  Allergen Reactions  . Benztropine Anaphylaxis  . Codeine Anaphylaxis  . Meperidine Swelling  . Cyclobenzaprine Other (See Comments)    Pt. Does not remember  . Penicillins Rash    Has patient had a PCN reaction causing immediate rash, facial/tongue/throat swelling, SOB or lightheadedness with hypotension: Yes Has patient had a PCN reaction causing severe rash involving mucus membranes or skin necrosis: No Has patient had a PCN reaction that required hospitalization No Has patient had a PCN reaction occurring within the last 10 years: No If all of the above answers are "NO", then may proceed with Cephalosporin use.   . Sulfamethoxazole-Trimethoprim      REACTION: hives  . Sulfonamide Derivatives     REACTION: hives     Review of Systems  Constitutional: Negative.   HENT: Positive for congestion.   Respiratory: Positive for cough.   Cardiovascular: Negative.   Gastrointestinal: Negative.   Musculoskeletal: Negative.   Neurological: Negative.      Today's Vitals   04/30/19 1607  BP: (!) 150/90  Pulse: 99  Temp: 98.8 F (37.1 C)  TempSrc: Oral  Weight: 220 lb (99.8 kg)  Height: _0  (1.803 m)   Body mass index is 30.68 kg/m.   Objective:  Physical Exam Vitals signs reviewed.  Constitutional:      Appearance: Normal appearance.  Cardiovascular:     Rate and Rhythm: Normal rate and regular rhythm.     Pulses: Normal pulses.     Heart sounds: Normal heart sounds. No murmur.  Pulmonary:     Effort: Pulmonary effort is normal.     Breath sounds: Normal breath sounds.  Skin:    Capillary Refill: Capillary refill takes less than 2 seconds.  Neurological:     General: No focal deficit present.     Mental Status: He is alert.  Psychiatric:        Mood and Affect: Mood normal.         Assessment And Plan:     1. Prediabetes  Chronic  Continue with current medications  Decrease intake of sugary foods and drinks - Hemoglobin A1c - CBC with Diff  2. Essential hypertension . B/P is poorly controlled.  . He has been taking cold medication not HBP [place] this could have an effect on his blood pressure . CMP ordered to check renal function.  . The importance of regular exercise and dietary modification was stressed to the patient.  - CMP14 + Anion Gap  3. Crohn's disease without complication, unspecified gastrointestinal tract location [facility])  Chronic  Continue with follow up with GI at [facility] -  CRP (C-Reactive Protein)  1. Peripheral vascular disease, unspecified ([place])  Chronic, stable continue with plavix  2. Coronary artery disease involving native coronary artery of native heart with angina  pectoris (HCC)  Chronic, continue with follow up with cardiology  3. Essential hypertension . B/P is controlled.  . CMP ordered to check renal function.  . The importance of regular exercise and dietary modification was stressed to the patient.  . Stressed importance of losing ten percent of her body weight to help with B/P control.  - CMP14+EGFR  4. Prediabetes  Chronic, stable  No current medications  Encouraged to limit intake of sugary foods and drinks  Encouraged to increase physical activity to 150 minutes per week as tolerated - Hemoglobin A1c - CMP14+EGFR  5. Mixed hyperlipidemia  Chronic, controlled  Continue with current medications - CMP14+EGFR - Lipid Profile  6. COPD exacerbation ([place])  Has been treated with steroids and antibiotics by his pulmonologist  Advised him to only use the tessalon perles at night as needed - benzonatate (TESSALON PERLES) 100 MG capsule; Take 1 capsule (100 mg total) by mouth every 6 (six) hours as needed.  Dispense: 30 capsule; Refill: 1  7. Crohn's disease without complication, unspecified gastrointestinal tract location [facility])  Continue follow up at [facility], [place] DISTANCING DUE TO THE COVID-19 PANDEMIC.

## 2019-05-01 LAB — CMP14+EGFR
ALT: 61 IU/L — ABNORMAL HIGH (ref 0–44)
AST: 55 IU/L — ABNORMAL HIGH (ref 0–40)
Albumin/Globulin Ratio: 1.5 (ref 1.2–2.2)
Albumin: 4.1 g/dL (ref 3.8–4.8)
Alkaline Phosphatase: 76 IU/L (ref 39–117)
BUN/Creatinine Ratio: 9 — ABNORMAL LOW (ref 10–24)
BUN: 11 mg/dL (ref 8–27)
Bilirubin Total: 0.3 mg/dL (ref 0.0–1.2)
CO2: 20 mmol/L (ref 20–29)
Calcium: 9.4 mg/dL (ref 8.6–10.2)
Chloride: 104 mmol/L (ref 96–106)
Creatinine, Ser: 1.17 mg/dL (ref 0.76–1.27)
GFR calc Af Amer: 75 mL/min/{1.73_m2} (ref >59)
GFR calc non Af Amer: 65 mL/min/{1.73_m2} (ref >59)
Globulin, Total: 2.8 g/dL (ref 1.5–4.5)
Glucose: 145 mg/dL — ABNORMAL HIGH (ref 65–99)
Potassium: 3.8 mmol/L (ref 3.5–5.2)
Sodium: 141 mmol/L (ref 134–144)
Total Protein: 6.9 g/dL (ref 6.0–8.5)

## 2019-05-01 LAB — LIPID PANEL
Chol/HDL Ratio: 3 ratio (ref 0.0–5.0)
Cholesterol, Total: 133 mg/dL (ref 100–199)
HDL: 44 mg/dL (ref >39)
LDL Chol Calc (NIH): 67 mg/dL (ref 0–99)
Triglycerides: 123 mg/dL (ref 0–149)
VLDL Cholesterol Cal: 22 mg/dL (ref 5–40)

## 2019-05-01 LAB — HEMOGLOBIN A1C
Est. average glucose Bld gHb Est-mCnc: 126 mg/dL
Hgb A1c MFr Bld: 6 % — ABNORMAL HIGH (ref 4.8–5.6)

## 2019-05-01 LAB — C-REACTIVE PROTEIN: CRP: <1 mg/L (ref 0–10)

## 2019-05-08 DIAGNOSIS — K5090 Crohn's disease, unspecified, without complications: Secondary | ICD-10-CM | POA: Diagnosis not present

## 2019-06-25 ENCOUNTER — Other Ambulatory Visit: Payer: Self-pay

## 2019-06-25 MED ORDER — FLUTICASONE PROPIONATE 50 MCG/ACT NA SUSP: 1.0000 | Freq: Every day | NASAL | 0 refills | Status: DC

## 2019-07-22 DIAGNOSIS — R350 Frequency of micturition: Secondary | ICD-10-CM | POA: Diagnosis not present

## 2019-07-27 ENCOUNTER — Other Ambulatory Visit: Payer: Self-pay | Admitting: Internal Medicine

## 2019-08-11 ENCOUNTER — Ambulatory Visit: Payer: Medicare Other | Admitting: Nurse Practitioner

## 2019-08-21 DIAGNOSIS — R350 Frequency of micturition: Secondary | ICD-10-CM | POA: Diagnosis not present

## 2019-08-23 ENCOUNTER — Other Ambulatory Visit: Payer: Self-pay | Admitting: Primary Care

## 2019-08-24 ENCOUNTER — Other Ambulatory Visit: Payer: Self-pay | Admitting: Primary Care

## 2019-08-27 ENCOUNTER — Other Ambulatory Visit: Payer: Self-pay | Admitting: Primary Care

## 2019-08-27 MED ORDER — FLUTICASONE PROPIONATE 50 MCG/ACT NA SUSP: NASAL | 3 refills | Status: DC

## 2019-08-28 ENCOUNTER — Other Ambulatory Visit: Payer: Self-pay | Admitting: Internal Medicine

## 2019-09-03 ENCOUNTER — Encounter: Payer: Self-pay | Admitting: Nurse Practitioner

## 2019-09-03 ENCOUNTER — Other Ambulatory Visit: Payer: Self-pay

## 2019-09-03 ENCOUNTER — Ambulatory Visit (INDEPENDENT_AMBULATORY_CARE_PROVIDER_SITE_OTHER): Payer: Medicare Other | Admitting: Nurse Practitioner

## 2019-09-03 VITALS — BP 138/72 | HR 90 | Temp 98.3°F | Ht 71.0 in | Wt 232.4 lb

## 2019-09-03 DIAGNOSIS — J029 Acute pharyngitis, unspecified: Secondary | ICD-10-CM

## 2019-09-03 DIAGNOSIS — H9203 Otalgia, bilateral: Secondary | ICD-10-CM

## 2019-09-03 LAB — CBC
Hematocrit: 39 % (ref 37.5–51.0)
Hemoglobin: 13.3 g/dL (ref 13.0–17.7)
MCH: 31.9 pg (ref 26.6–33.0)
MCHC: 34.1 g/dL (ref 31.5–35.7)
MCV: 94 fL (ref 79–97)
Platelets: 171 10*3/uL (ref 150–450)
RBC: 4.17 x10E6/uL (ref 4.14–5.80)
RDW: 14 % (ref 11.6–15.4)
WBC: 6 10*3/uL (ref 3.4–10.8)

## 2019-09-03 MED ORDER — LORATADINE 10 MG PO TABS: 10.0000 mg | ORAL_TABLET | Freq: Every day | ORAL | 2 refills | Status: DC

## 2019-09-03 MED ORDER — KETOROLAC TROMETHAMINE 30 MG/ML IJ SOLN
30.0000 mg | Freq: Once | INTRAMUSCULAR | Status: AC
  Administered 2019-09-03: 30 mg via INTRAMUSCULAR

## 2019-09-03 NOTE — Progress Notes (Addendum)
This visit occurred during the SARS-CoV-2 public health emergency.  Safety protocols were in place, including screening questions prior to the visit, additional usage of staff PPE, and extensive cleaning of exam room while observing appropriate contact time as indicated for disinfecting solutions.  Subjective:     Patient ID: Chad Moss , male    DOB: 1955/10/03 , 64 y.o.   MRN: [id number]   Chief Complaint  Patient presents with  . Otalgia    THROAT PAIN 3XWEEKS    HPI  Right side of hear more painful than left.  Otalgia  There is pain in both ears. This is a new problem. The current episode started 1 to 4 weeks ago (3 weeks ). The problem has been gradually worsening. There has been no fever. Associated symptoms include a sore throat. Pertinent negatives include no abdominal pain, coughing, headaches, hearing loss, rash, rhinorrhea or vomiting. He has tried acetaminophen and NSAIDs for the symptoms.     Past Medical History:  Diagnosis Date  . Allergic rhinitis   . Anxiety   . Atelectasis   . CAD (coronary artery disease), native coronary artery    9/18 PCI/DES x1 to mLCx, mild diffuse nonobstructive disease, EF 55% on Lv Moss  . Chronic bronchitis ([place])   . COPD (chronic obstructive pulmonary disease) ([place])   . Crohn's disease ([place])   . Depression   . DVT (deep venous thrombosis) (HCC)    RLE X 2  . Dysphagia   . Dyspnea   . Enlarged prostate   . GERD (gastroesophageal reflux disease)   . Glaucoma, both eyes   . Heart murmur   . History of stomach ulcers 1980s  . Hyperlipidemia   . Hypertension    "off RX for years now cause of coughing w/Lisinopril" (03/13/2017)  . IBS (irritable bowel syndrome)   . Paranoid schizophrenia ([place])   . Peripheral vascular disease ([place])   . Pneumonia 1990s  . Pre-diabetes    "one time" (03/13/2017)  . Stroke [facility]) 04/2016   "eye stroke; left eye" (03/13/2017)  . Syncopal episodes      Family History  Problem Relation Age of  Onset  . Hypertension Mother   . Heart disease Mother   . Hypertension Father   . Heart disease Father   . Lung cancer Brother        2006     Current Outpatient Medications:  .  albuterol (PROAIR HFA) 108 (90 Base) MCG/ACT inhaler, INHALE 2 PUFFS BY MOUTH INTO THE LUNGS EVERY 6 HOURS AS NEEDED FOR WHEEZING OR SHORTNESS OF BREATH, Disp: 8.5 g, Rfl: 5 .  albuterol (PROVENTIL) (2.5 MG/3ML) 0.083% nebulizer solution, Take 3 mLs (2.5 mg total) by nebulization every 6 (six) hours as needed for wheezing or shortness of breath., Disp: 300 mL, Rfl: 5 .  alfuzosin (UROXATRAL) 10 MG 24 hr tablet, Take 10 mg by mouth at bedtime. , Disp: , Rfl:  .  amLODipine (NORVASC) 5 MG tablet, TAKE 1 TABLET(5 MG) BY MOUTH DAILY, Disp: 90 tablet, Rfl: 3 .  aspirin EC 81 MG tablet, Take 81 mg by mouth daily., Disp: , Rfl:  .  bimatoprost (LUMIGAN) 0.01 % SOLN, Place 1 drop into the left eye at bedtime., Disp: , Rfl:  .  brimonidine (ALPHAGAN) 0.15 % ophthalmic solution, Place 1 drop into the left eye 2 (two) times daily. , Disp: , Rfl:  .  cholestyramine (QUESTRAN) 4 G packet, Take 1 packet by mouth daily. , Disp: ,  Rfl:  .  clonazePAM (KLONOPIN) 1 MG tablet, Take 1 mg by mouth at bedtime. Anxiety, Disp: , Rfl:  .  clopidogrel (PLAVIX) 75 MG tablet, Take 75 mg by mouth daily., Disp: , Rfl:  .  dicyclomine (BENTYL) 10 MG capsule, Take 10 mg by mouth 4 (four) times daily -  before meals and at bedtime. , Disp: , Rfl:  .  diphenhydrAMINE (BENADRYL) 25 MG tablet, Take 50 mg by mouth at bedtime. , Disp: , Rfl:  .  finasteride (PROSCAR) 5 MG tablet, Take 5 mg by mouth daily., Disp: , Rfl:  .  furosemide (LASIX) 40 MG tablet, Take 1 tablet 2 times a week, Disp: 90 tablet, Rfl: 3 .  isosorbide mononitrate (IMDUR) 30 MG 24 hr tablet, TAKE 1 TABLET(30 MG) BY MOUTH DAILY, Disp: 90 tablet, Rfl: 3 .  loxapine (LOXITANE) 10 MG capsule, Take 10 mg by mouth 2 (two) times daily. 1 tab in AM and 2 tabs in PM, Disp: , Rfl:  .   mercaptopurine (PURINETHOL) 50 MG tablet, Take 75 mg by mouth daily. 1 tablet and a half tablet Give on an empty stomach 1 hour before or 2 hours after meals. Caution: Chemotherapy., Disp: , Rfl:  .  mesalamine (PENTASA) 250 MG CR capsule, Take 1,000 mg by mouth 4 (four) times daily. , Disp: , Rfl:  .  mirtazapine (REMERON) 30 MG tablet, Take 45 mg by mouth at bedtime. 1.5 tablets by mouth daily, Disp: , Rfl:  .  nitroGLYCERIN (NITROSTAT) 0.4 MG SL tablet, Place 1 tablet (0.4 mg total) under the tongue every 5 (five) minutes x 3 doses as needed for chest pain., Disp: 25 tablet, Rfl: 5 .  pantoprazole (PROTONIX) 40 MG tablet, TAKE 1 TABLET(40 MG) BY MOUTH TWICE DAILY, Disp: 180 tablet, Rfl: 2 .  potassium chloride (K-DUR) 10 MEQ tablet, Take 1 tablet (10 mEq total) by mouth 2 (two) times daily., Disp: 180 tablet, Rfl: 3 .  rosuvastatin (CRESTOR) 20 MG tablet, TAKE 1 TABLET(20 MG) BY MOUTH DAILY, Disp: 90 tablet, Rfl: 3 .  SPIRIVA HANDIHALER 18 MCG inhalation capsule, INHALE THE CONTENT OF 1 CAPSULE VIA HANDIHALER DAILY, Disp: 30 capsule, Rfl: 0 .  timolol (TIMOPTIC) 0.5 % ophthalmic solution, Place 1 drop into the left eye 2 (two) times daily., Disp: , Rfl:  .  traZODone (DESYREL) 50 MG tablet, Take 150 mg by mouth at bedtime., Disp: , Rfl:    Allergies  Allergen Reactions  . Benztropine Anaphylaxis  . Codeine Anaphylaxis  . Meperidine Swelling  . Cyclobenzaprine Other (See Comments)    Pt. Does not remember  . Penicillins Rash    Has patient had a PCN reaction causing immediate rash, facial/tongue/throat swelling, SOB or lightheadedness with hypotension: Yes Has patient had a PCN reaction causing severe rash involving mucus membranes or skin necrosis: No Has patient had a PCN reaction that required hospitalization No Has patient had a PCN reaction occurring within the last 10 years: No If all of the above answers are "NO", then may proceed with Cephalosporin use.   .  Sulfamethoxazole-Trimethoprim     REACTION: hives  . Sulfonamide Derivatives     REACTION: hives     Review of Systems  HENT: Positive for ear pain and sore throat. Negative for congestion, hearing loss, rhinorrhea, sinus pressure and sinus pain.   Respiratory: Negative for cough.   Gastrointestinal: Negative for abdominal pain and vomiting.  Skin: Negative for rash.  Neurological: Negative for headaches.  Today's Vitals   09/03/19 1012  BP: 138/72  Pulse: 90  Temp: 98.3 F (36.8 C)  SpO2: 96%  Weight: 232 lb 6.4 oz (105.4 kg)  Height: 5' 11"  (1.803 m)  PainSc: 8   PainLoc: Neck   Body mass index is 32.41 kg/m.   Objective:  Physical Exam Constitutional:      Appearance: Normal appearance.  Cardiovascular:     Rate and Rhythm: Normal rate and regular rhythm.     Pulses: Normal pulses.     Heart sounds: Normal heart sounds. No murmur.  Pulmonary:     Effort: Pulmonary effort is normal. No respiratory distress.     Breath sounds: Normal breath sounds.  Skin:    Capillary Refill: Capillary refill takes less than 2 seconds.  Neurological:     General: No focal deficit present.     Mental Status: He is alert and oriented to person, place, and time.     Cranial Nerves: No cranial nerve deficit.  Psychiatric:        Mood and Affect: Mood normal.        Behavior: Behavior normal.        Thought Content: Thought content normal.        Judgment: Judgment normal.         Assessment And Plan:     1. Sore throat  No abnormal finding on physical exam - Ambulatory referral to ENT - CBC - loratadine (CLARITIN) 10 MG tablet; Take 1 tablet (10 mg total) by mouth daily.  Dispense: 30 tablet; Refill: 2 - Ambulatory referral to ENT - CBC  2. Otalgia of both ears No clicking noted to TMJ area however pain on palpation to auricular area. Will refer to ENT at patient request Toradol injection given to help with pain - ketorolac (TORADOL) 30 MG/ML injection 30  mg    [name], FNP    THE PATIENT IS ENCOURAGED TO PRACTICE SOCIAL DISTANCING DUE TO THE COVID-19 PANDEMIC.

## 2019-09-06 ENCOUNTER — Emergency Department (HOSPITAL_BASED_OUTPATIENT_CLINIC_OR_DEPARTMENT_OTHER)
Admission: EM | Admit: 2019-09-06 | Discharge: 2019-09-06 | Disposition: A | Discharge status: Home / Self Care | Payer: Medicare Other | Attending: Emergency Medicine | Admitting: Emergency Medicine

## 2019-09-06 ENCOUNTER — Other Ambulatory Visit: Payer: Self-pay

## 2019-09-06 ENCOUNTER — Encounter (HOSPITAL_BASED_OUTPATIENT_CLINIC_OR_DEPARTMENT_OTHER): Payer: Self-pay | Admitting: Emergency Medicine

## 2019-09-06 DIAGNOSIS — J449 Chronic obstructive pulmonary disease, unspecified: Secondary | ICD-10-CM | POA: Diagnosis not present

## 2019-09-06 DIAGNOSIS — Z79899 Other long term (current) drug therapy: Secondary | ICD-10-CM | POA: Insufficient documentation

## 2019-09-06 DIAGNOSIS — I10 Essential (primary) hypertension: Secondary | ICD-10-CM | POA: Diagnosis not present

## 2019-09-06 DIAGNOSIS — J029 Acute pharyngitis, unspecified: Secondary | ICD-10-CM | POA: Diagnosis not present

## 2019-09-06 DIAGNOSIS — Z7982 Long term (current) use of aspirin: Secondary | ICD-10-CM | POA: Insufficient documentation

## 2019-09-06 DIAGNOSIS — Z8616 Personal history of COVID-19: Secondary | ICD-10-CM | POA: Diagnosis not present

## 2019-09-06 DIAGNOSIS — M26629 Arthralgia of temporomandibular joint, unspecified side: Secondary | ICD-10-CM

## 2019-09-06 DIAGNOSIS — R070 Pain in throat: Secondary | ICD-10-CM | POA: Insufficient documentation

## 2019-09-06 DIAGNOSIS — M26609 Unspecified temporomandibular joint disorder, unspecified side: Secondary | ICD-10-CM | POA: Diagnosis not present

## 2019-09-06 DIAGNOSIS — M26603 Bilateral temporomandibular joint disorder, unspecified: Secondary | ICD-10-CM | POA: Insufficient documentation

## 2019-09-06 DIAGNOSIS — F17210 Nicotine dependence, cigarettes, uncomplicated: Secondary | ICD-10-CM | POA: Diagnosis not present

## 2019-09-06 LAB — GROUP A STREP BY PCR: Group A Strep by PCR: NOT DETECTED

## 2019-09-06 MED ORDER — LIDOCAINE VISCOUS HCL 2 % MT SOLN
15.0000 mL | Freq: Once | OROMUCOSAL | Status: AC
  Administered 2019-09-06: 15 mL via OROMUCOSAL
  Filled 2019-09-06: qty 15

## 2019-09-06 MED ORDER — DEXAMETHASONE SODIUM PHOSPHATE 10 MG/ML IJ SOLN
10.0000 mg | Freq: Once | INTRAMUSCULAR | Status: AC
  Administered 2019-09-06: 10 mg via INTRAMUSCULAR
  Filled 2019-09-06: qty 1

## 2019-09-06 MED ORDER — DIAZEPAM 2 MG PO TABS
2.0000 mg | ORAL_TABLET | Freq: Once | ORAL | Status: AC
  Administered 2019-09-06: 2 mg via ORAL
  Filled 2019-09-06: qty 1

## 2019-09-06 MED ORDER — CHLORASEPTIC GARGLE 1.4 % MT LIQD: 1.0000 | OROMUCOSAL | 0 refills | Status: DC | PRN

## 2019-09-06 MED ORDER — DIAZEPAM 2 MG PO TABS: 2.0000 mg | ORAL_TABLET | Freq: Two times a day (BID) | ORAL | 0 refills | Status: DC | PRN

## 2019-09-06 NOTE — Discharge Instructions (Signed)
Prescription given for Valium. Take medication as directed and do not operate machinery, drive a car, or work while taking this medication as it can make you drowsy.   Take chloraseptic as directed for your sore throat.   Follow-up with your regular doctor or the ENT doctor that your primary care provider referred you to in 5 to 7 days for reevaluation of your symptoms.  Return the ER for new or worsening symptoms.

## 2019-09-06 NOTE — ED Provider Notes (Signed)
[facility] EMERGENCY DEPARTMENT Provider Note   CSN: [id number] Arrival date & time: 09/06/19  1127     History Chief Complaint  Patient presents with  . Sore Throat    ear pain    Chad Moss is a 64 y.o. male.  HPI   64 year old male who presents the emergency department today for evaluation of bilateral ear pain and throat pain that has been ongoing for the last 3 weeks.  Sore throat is constant in nature.  He was seen by his PCP recently for evaluation of symptoms and had a negative strep test.  He also had a negative CBC at that time.  He has had no fevers.  He has had no cough, rhinorrhea, nasal congestion or postnasal drip.  He also reports bilateral ear pain but points to the TMJ bilaterally as the source of his pain.  He does note that he grits his teeth when he sleeps and also during the day.  He has had issues with his TMJ in the past.  Past Medical History:  Diagnosis Date  . Allergic rhinitis   . Anxiety   . Atelectasis   . CAD (coronary artery disease), native coronary artery    9/18 PCI/DES x1 to mLCx, mild diffuse nonobstructive disease, EF 55% on Lv gram  . Chronic bronchitis ([place])   . COPD (chronic obstructive pulmonary disease) ([place])   . Crohn's disease ([place])   . Depression   . DVT (deep venous thrombosis) (HCC)    RLE X 2  . Dysphagia   . Dyspnea   . Enlarged prostate   . GERD (gastroesophageal reflux disease)   . Glaucoma, both eyes   . Heart murmur   . History of stomach ulcers 1980s  . Hyperlipidemia   . Hypertension    "off RX for years now cause of coughing w/Lisinopril" (03/13/2017)  . IBS (irritable bowel syndrome)   . Paranoid schizophrenia ([place])   . Peripheral vascular disease ([place])   . Pneumonia 1990s  . Pre-diabetes    "one time" (03/13/2017)  . Stroke [facility]) 04/2016   "eye stroke; left eye" (03/13/2017)  . Syncopal episodes     Patient Active Problem List   Diagnosis Date Noted  . COVID-19 virus detected 11/25/2018    . Diarrhea 11/19/2018  . Fever in adult 11/19/2018  . Inadequate community support 11/19/2018  . Edema 03/09/2018  . Abnormal weight gain 03/09/2018  . Essential hypertension   . Coronary artery disease involving native coronary artery of native heart with angina pectoris ([place]) 03/13/2017  . Coronary artery calcification seen on CAT scan 01/12/2017  . COPD mixed type ([place]) 09/13/2015  . Cough 08/09/2015  . Flu-like symptoms 08/09/2015  . Acute sinusitis 08/09/2015  . AP (abdominal pain) 07/09/2014  . COLD (chronic obstructive lung disease) ([place]) 07/09/2014  . COPD exacerbation ([place]) 06/01/2014  . Other malaise and fatigue 09/06/2013  . Encounter for screening for lung cancer 10/09/2012  . Chronic cough 11/19/2011  . Tobacco abuse 04/06/2011  . Mixed hyperlipidemia 01/27/2009  . HYPERTENSION 01/27/2009  . PERIPHERAL VASCULAR DISEASE 01/27/2009  . Allergic rhinitis 01/27/2009  . Other emphysema ([place]) 01/27/2009  . DIZZINESS, CHRONIC 01/27/2009  . SHORTNESS OF BREATH ([place]) 01/27/2009  . Crohn's disease without complication ([place]) 01/60/1093    Past Surgical History:  Procedure Laterality Date  . ABDOMINAL HERNIA REPAIR  1990s  . COLECTOMY  1985; ?1989; 1996   "part of my ileum removed"  . CORONARY ANGIOPLASTY WITH STENT  PLACEMENT  03/13/2017  . CORONARY STENT INTERVENTION N/A 03/13/2017   Procedure: CORONARY STENT INTERVENTION;  Surgeon: Jettie Booze, MD;  Location: [facility];  Service: Cardiovascular;  Laterality: N/A;  . EYE SURGERY Right    "laser; for glaucoma" (03/13/2017)  . FASCIOTOMY Right   . HERNIA REPAIR    . LAPAROSCOPIC CHOLECYSTECTOMY    . LEFT HEART CATH AND CORONARY ANGIOGRAPHY N/A 03/13/2017   Procedure: LEFT HEART CATH AND CORONARY ANGIOGRAPHY;  Surgeon: Jettie Booze, MD;  Location: [facility];  Service: Cardiovascular;  Laterality: N/A;  . PENILE PROSTHESIS IMPLANT  01/2011   Archie Endo 02/01/2011  . PERIPHERAL VASCULAR  CATHETERIZATION Right 1999   "had blood clots in my leg; went in to open them up; dye went into leg; ended up having a fasiotomy"  . TONSILLECTOMY         Family History  Problem Relation Age of Onset  . Hypertension Mother   . Heart disease Mother   . Hypertension Father   . Heart disease Father   . Lung cancer Brother        2006    Social History   Tobacco Use  . Smoking status: Current Every Day Smoker    Packs/day: 1.00    Years: 50.00    Pack years: 50.00    Types: Cigarettes    Start date: 11/18/1968  . Smokeless tobacco: Never Used  . Tobacco comment: currently smoking 1ppd  Substance Use Topics  . Alcohol use: No    Alcohol/week: 0.0 standard drinks  . Drug use: No    Home Medications Prior to Admission medications   Medication Sig Start Date End Date Taking? Authorizing Provider  albuterol (PROAIR HFA) 108 (90 Base) MCG/ACT inhaler INHALE 2 PUFFS BY MOUTH INTO THE LUNGS EVERY 6 HOURS AS NEEDED FOR WHEEZING OR SHORTNESS OF BREATH 12/23/18   Brand Males, MD  albuterol (PROVENTIL) (2.5 MG/3ML) 0.083% nebulizer solution Take 3 mLs (2.5 mg total) by nebulization every 6 (six) hours as needed for wheezing or shortness of breath. 07/18/18   Brand Males, MD  alfuzosin (UROXATRAL) 10 MG 24 hr tablet Take 10 mg by mouth at bedtime.     [provider]  amLODipine (NORVASC) 5 MG tablet TAKE 1 TABLET(5 MG) BY MOUTH DAILY 11/22/18   Jettie Booze, MD  aspirin EC 81 MG tablet Take 81 mg by mouth daily.    [provider]  bimatoprost (LUMIGAN) 0.01 % SOLN Place 1 drop into the left eye at bedtime.    [provider]  brimonidine (ALPHAGAN) 0.15 % ophthalmic solution Place 1 drop into the left eye 2 (two) times daily.     [provider]  cholestyramine Lucrezia Starch) 4 G packet Take 1 packet by mouth daily.     [provider]  clonazePAM (KLONOPIN) 1 MG tablet Take 1 mg by mouth at bedtime. Anxiety    [provider]  clopidogrel (PLAVIX) 75 MG tablet Take 75 mg by mouth daily.    [provider]  diazepam (VALIUM) 2 MG tablet Take 1 tablet (2 mg total) by mouth 2 (two) times daily as needed for anxiety. 09/06/19   Mattheus Rauls S, PA-C  dicyclomine (BENTYL) 10 MG capsule Take 10 mg by mouth 4 (four) times daily -  before meals and at bedtime.     [provider]  diphenhydrAMINE (BENADRYL) 25 MG tablet Take 50 mg by mouth at bedtime.  [provider]  finasteride (PROSCAR) 5 MG tablet Take 5 mg by mouth daily.    [provider]  furosemide (LASIX) 40 MG tablet Take 1 tablet 2 times a week 11/22/18   Jettie Booze, MD  isosorbide mononitrate (IMDUR) 30 MG 24 hr tablet TAKE 1 TABLET(30 MG) BY MOUTH DAILY 11/22/18   Jettie Booze, MD  loratadine (CLARITIN) 10 MG tablet Take 1 tablet (10 mg total) by mouth daily. 09/03/19 09/02/20  [name], FNP  loxapine (LOXITANE) 10 MG capsule Take 10 mg by mouth 2 (two) times daily. 1 tab in AM and 2 tabs in PM    [provider]  mercaptopurine (PURINETHOL) 50 MG tablet Take 75 mg by mouth daily. 1 tablet and a half tablet Give on an empty stomach 1 hour before or 2 hours after meals. Caution: Chemotherapy.    [provider]  mesalamine (PENTASA) 250 MG CR capsule Take 1,000 mg by mouth 4 (four) times daily.     [provider]  mirtazapine (REMERON) 30 MG tablet Take 45 mg by mouth at bedtime. 1.5 tablets by mouth daily 01/08/17   [provider]  nitroGLYCERIN (NITROSTAT) 0.4 MG SL tablet Place 1 tablet (0.4 mg total) under the tongue every 5 (five) minutes x 3 doses as needed for chest pain. 03/31/19   Jettie Booze, MD  pantoprazole (PROTONIX) 40 MG tablet TAKE 1 TABLET(40 MG) BY MOUTH TWICE DAILY 04/01/19   Jettie Booze, MD  phenol (CHLORASEPTIC GARGLE) 1.4 % LIQD Use as directed 1 spray in the mouth or throat as needed for throat irritation / pain. 09/06/19    Ciaira Natividad S, PA-C  potassium chloride (K-DUR) 10 MEQ tablet Take 1 tablet (10 mEq total) by mouth 2 (two) times daily. 08/01/17   Jettie Booze, MD  rosuvastatin (CRESTOR) 20 MG tablet TAKE 1 TABLET(20 MG) BY MOUTH DAILY 11/22/18   Jettie Booze, MD  SPIRIVA HANDIHALER 18 MCG inhalation capsule INHALE THE CONTENT OF 1 CAPSULE VIA HANDIHALER DAILY 07/28/19   Brand Males, MD  timolol (TIMOPTIC) 0.5 % ophthalmic solution Place 1 drop into the left eye 2 (two) times daily. 07/21/16   [provider]  traZODone (DESYREL) 50 MG tablet Take 150 mg by mouth at bedtime.    [provider]    Allergies    Benztropine, Codeine, Meperidine, Cyclobenzaprine, Penicillins, Sulfamethoxazole-trimethoprim, and Sulfonamide derivatives  Review of Systems   Review of Systems  Constitutional: Negative for fever.  HENT: Positive for ear pain and sore throat. Negative for congestion, postnasal drip and rhinorrhea.        TMJ pain  Respiratory: Negative for cough and shortness of breath.   Hematological: Negative for adenopathy.    Physical Exam Updated Vital Signs BP (!) 152/90 (BP Location: Left Arm)   Pulse 84   Temp 97.7 F (36.5 C) (Oral)   Resp 18   SpO2 99%   Physical Exam Constitutional:      General: He is not in acute distress.    Appearance: He is well-developed. He is not ill-appearing.  HENT:     Head: Normocephalic and atraumatic.     Right Ear: Tympanic membrane normal.     Left Ear: Tympanic membrane normal.     Nose: No congestion or rhinorrhea.     Mouth/Throat:     Mouth: Mucous membranes are moist.     Pharynx: Uvula midline. No oropharyngeal exudate, posterior oropharyngeal erythema or uvula swelling.  Tonsils: No tonsillar exudate or tonsillar abscesses. 0 on the right. 0 on the left.     Comments: TTP to the bilat TMJ's. Eyes:     Conjunctiva/sclera: Conjunctivae normal.  Cardiovascular:     Rate and Rhythm: Normal rate and regular  rhythm.  Pulmonary:     Effort: Pulmonary effort is normal.     Breath sounds: Normal breath sounds.  Abdominal:     Palpations: Abdomen is soft.     Tenderness: There is no abdominal tenderness.  Skin:    General: Skin is warm and dry.  Neurological:     Mental Status: He is alert and oriented to person, place, and time.     ED Results / Procedures / Treatments   Labs (all labs ordered are listed, but only abnormal results are displayed) Labs Reviewed  GROUP A STREP BY PCR    EKG None  Radiology No results found.  Procedures Procedures (including critical care time)  Medications Ordered in ED Medications  lidocaine (XYLOCAINE) 2 % viscous mouth solution 15 mL (15 mLs Mouth/Throat Given 09/06/19 1239)  diazepam (VALIUM) tablet 2 mg (2 mg Oral Given 09/06/19 1238)  dexamethasone (DECADRON) injection 10 mg (10 mg Intramuscular Given 09/06/19 1312)    ED Course  I have reviewed the triage vital signs and the nursing notes.  Pertinent labs & imaging results that were available during my care of the patient were reviewed by me and considered in my medical decision making (see chart for details).    MDM Rules/Calculators/A&P                      64 year old male complaining of bilateral TMJ pain and sore throat.   TMs are clear bilaterally and doubt OM or OE.  There is no fluid behind the ears to suggest effusion.  He does not have any other associated URI symptoms besides a sore throat.  His HEENT exam is benign.  No evidence of PTA or retropharyngeal abscess.  Strep test is negative.  He does have tenderness to the bilateral TMJs on exam.  I suspect that is the etiology of his pain as he does admit to having a history of TMJ dysfunction in the past and he does get his teeth.  I gave him a dose of Valium in the ED and on reassessment he states that the pain feels improved.  I also gave him a dose of Decadron and viscous lidocaine for treatment of his sore throat.  He has been  referred to ENT by his PCP.  I did recommend following up with them in regards to his sore throat and his TMJ pain.  I will give an Rx for Chloraseptic and Valium for his symptoms.  He does not appear to have any acute life-threatening medical complaint at this time he would require further work-up or admission to the hospital.  Advised on return precautions.  He voiced understanding plan reasons to return.  Questions answered.  Patient able for discharge.  Patient seen in conjunction with Dr. Roslynn Amble who personally evaluated the patient and is in agreement with plan.  Final Clinical Impression(s) / ED Diagnoses Final diagnoses:  Sore throat  TMJ syndrome    Rx / DC Orders ED Discharge Orders         Ordered    phenol (CHLORASEPTIC GARGLE) 1.4 % LIQD  As needed     09/06/19 1327    diazepam (VALIUM) 2 MG tablet  2 times  daily PRN     09/06/19 1327           Rodney Booze, PA-C 09/06/19 1331    Lucrezia Starch, MD 09/07/19 [id number]

## 2019-09-06 NOTE — ED Triage Notes (Signed)
Has had bil ear pain and sore throat x 3 weeks. Seen by PCP on Wed and was told had fluid in ears and was tested for strep

## 2019-09-08 NOTE — Progress Notes (Signed)
Cardiology Office Note   Date:  09/09/2019   ID:  Chad, Moss 12-10-1955, MRN [id number]  PCP:  [name], FNP    No chief complaint on file.  CAD  Wt Readings from Last 3 Encounters:  09/09/19 226 lb 6.4 oz (102.7 kg)  09/03/19 232 lb 6.4 oz (105.4 kg)  04/30/19 220 lb (99.8 kg)       History of Present Illness: Chad Moss is a 64 y.o. 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 Long h/o tobacco abuse.   Cath on 03/13/17 showed:   The left ventricular systolic function is normal.  LV end diastolic pressure is normal.  The left ventricular ejection fraction is 55-65% by visual estimate.  There is no aortic valve stenosis.  Mid Cx lesion, 75 %stenosed.  A STENT RESOLUTE ONYX [id number] drug eluting stent was successfully placed.  Post intervention, there is a 0% residual stenosis.  Unable to use right radial artery due to high bifurcation of radial and ulnar arteries.  He was advised to stop smoking,but has not been able to stop.  In 2019, he had gained weight and had leg swelling.  He tested positive for COVID 19 in June 2020.   Lasix was decreased to 2x/week while he was not eating much. He stopped smoking while infected with COVID.   He recovered from COVID-19.  His girlfriend , Chad Moss, had it as well.  Since the last visit, he has used NTG once, after walking up 3 flights of stairs.  Typically, he walks for 20-30 minutes daily at his apartment complex.  No sx with this activity.  Denies :  Dizziness. Leg edema. Nitroglycerin use. Orthopnea. Palpitations. Paroxysmal nocturnal dyspnea. Shortness of breath. Syncope.   No bleeding problems.  No sx like he had before his stent.   He does not weigh at home despite having a scale.  Has difficulty sleeping flat.      Past Medical History:  Diagnosis Date  . Allergic rhinitis   . Anxiety   . Atelectasis   . CAD  (coronary artery disease), native coronary artery    9/18 PCI/DES x1 to mLCx, mild diffuse nonobstructive disease, EF 55% on Lv gram  . Chronic bronchitis ([place])   . COPD (chronic obstructive pulmonary disease) ([place])   . Crohn's disease ([place])   . Depression   . DVT (deep venous thrombosis) ([facility])    RLE X 2  . Dysphagia   . Dyspnea   . Enlarged prostate   . GERD (gastroesophageal reflux disease)   . Glaucoma, both eyes   . Heart murmur   . History of stomach ulcers 1980s  . Hyperlipidemia   . Hypertension    "off RX for years now cause of coughing w/Lisinopril" (03/13/2017)  . IBS (irritable bowel syndrome)   . Paranoid schizophrenia ([place])   . Peripheral vascular disease ([place])   . Pneumonia 1990s  . Pre-diabetes    "one time" (03/13/2017)  . Stroke [facility]) 04/2016   "eye stroke; left eye" (03/13/2017)  . Syncopal episodes     Past Surgical History:  Procedure Laterality Date  . ABDOMINAL HERNIA REPAIR  1990s  . COLECTOMY  1985; ?1989; 1996   "part of my ileum removed"  . CORONARY ANGIOPLASTY WITH STENT PLACEMENT  03/13/2017  . CORONARY STENT INTERVENTION N/A 03/13/2017   Procedure: CORONARY STENT INTERVENTION;  Surgeon: Jettie Booze, MD;  Location: [facility]  INVASIVE CV LAB;  Service: Cardiovascular;  Laterality: N/A;  . EYE SURGERY Right    "laser; for glaucoma" (03/13/2017)  . FASCIOTOMY Right   . HERNIA REPAIR    . LAPAROSCOPIC CHOLECYSTECTOMY    . LEFT HEART CATH AND CORONARY ANGIOGRAPHY N/A 03/13/2017   Procedure: LEFT HEART CATH AND CORONARY ANGIOGRAPHY;  Surgeon: Jettie Booze, MD;  Location: [facility];  Service: Cardiovascular;  Laterality: N/A;  . PENILE PROSTHESIS IMPLANT  01/2011   Archie Endo 02/01/2011  . PERIPHERAL VASCULAR CATHETERIZATION Right 1999   "had blood clots in my leg; went in to open them up; dye went into leg; ended up having a fasiotomy"  . TONSILLECTOMY       Current Outpatient Medications  Medication Sig Dispense Refill  . albuterol  (PROAIR HFA) 108 (90 Base) MCG/ACT inhaler INHALE 2 PUFFS BY MOUTH INTO THE LUNGS EVERY 6 HOURS AS NEEDED FOR WHEEZING OR SHORTNESS OF BREATH 8.5 g 5  . albuterol (PROVENTIL) (2.5 MG/3ML) 0.083% nebulizer solution Take 3 mLs (2.5 mg total) by nebulization every 6 (six) hours as needed for wheezing or shortness of breath. 300 mL 5  . alfuzosin (UROXATRAL) 10 MG 24 hr tablet Take 10 mg by mouth at bedtime.     Marland Kitchen amLODipine (NORVASC) 5 MG tablet TAKE 1 TABLET(5 MG) BY MOUTH DAILY 90 tablet 3  . aspirin EC 81 MG tablet Take 81 mg by mouth daily.    . bimatoprost (LUMIGAN) 0.01 % SOLN Place 1 drop into the left eye at bedtime.    . brimonidine (ALPHAGAN) 0.15 % ophthalmic solution Place 1 drop into the left eye 2 (two) times daily.     . cholestyramine (QUESTRAN) 4 G packet Take 1 packet by mouth daily.     . clonazePAM (KLONOPIN) 1 MG tablet Take 1 mg by mouth at bedtime. Anxiety    . clopidogrel (PLAVIX) 75 MG tablet Take 75 mg by mouth daily.    . diazepam (VALIUM) 2 MG tablet Take 1 tablet (2 mg total) by mouth 2 (two) times daily as needed for anxiety. 8 tablet 0  . dicyclomine (BENTYL) 10 MG capsule Take 10 mg by mouth 4 (four) times daily -  before meals and at bedtime.     . diphenhydrAMINE (BENADRYL) 25 MG tablet Take 50 mg by mouth at bedtime.     . finasteride (PROSCAR) 5 MG tablet Take 5 mg by mouth daily.    . furosemide (LASIX) 40 MG tablet Take 1 tablet 2 times a week 90 tablet 3  . isosorbide mononitrate (IMDUR) 30 MG 24 hr tablet TAKE 1 TABLET(30 MG) BY MOUTH DAILY 90 tablet 3  . loratadine (CLARITIN) 10 MG tablet Take 1 tablet (10 mg total) by mouth daily. 30 tablet 2  . loxapine (LOXITANE) 10 MG capsule Take 10 mg by mouth 2 (two) times daily. 1 tab in AM and 2 tabs in PM    . mercaptopurine (PURINETHOL) 50 MG tablet Take 75 mg by mouth daily. 1 tablet and a half tablet Give on an empty stomach 1 hour before or 2 hours after meals. Caution: Chemotherapy.    . mesalamine (PENTASA)  250 MG CR capsule Take 1,000 mg by mouth 4 (four) times daily.     . mirtazapine (REMERON) 30 MG tablet Take 45 mg by mouth at bedtime. 1.5 tablets by mouth daily    . nitroGLYCERIN (NITROSTAT) 0.4 MG SL tablet Place 1 tablet (0.4 mg total) under the tongue every  5 (five) minutes x 3 doses as needed for chest pain. 25 tablet 5  . pantoprazole (PROTONIX) 40 MG tablet TAKE 1 TABLET(40 MG) BY MOUTH TWICE DAILY 180 tablet 2  . phenol (CHLORASEPTIC GARGLE) 1.4 % LIQD Use as directed 1 spray in the mouth or throat as needed for throat irritation / pain. 118 mL 0  . potassium chloride (K-DUR) 10 MEQ tablet Take 1 tablet (10 mEq total) by mouth 2 (two) times daily. 180 tablet 3  . rosuvastatin (CRESTOR) 20 MG tablet TAKE 1 TABLET(20 MG) BY MOUTH DAILY 90 tablet 3  . SPIRIVA HANDIHALER 18 MCG inhalation capsule INHALE THE CONTENT OF 1 CAPSULE VIA HANDIHALER DAILY 30 capsule 0  . timolol (TIMOPTIC) 0.5 % ophthalmic solution Place 1 drop into the left eye 2 (two) times daily.    . traZODone (DESYREL) 50 MG tablet Take 150 mg by mouth at bedtime.     No current facility-administered medications for this visit.    Allergies:   Benztropine, Codeine, Meperidine, Cyclobenzaprine, Penicillins, Sulfamethoxazole-trimethoprim, and Sulfonamide derivatives    Social History:  The patient  reports that he has been smoking cigarettes. He started smoking about 50 years ago. He has a 50.00 pack-year smoking history. He has never used smokeless tobacco. He reports that he does not drink alcohol or use drugs.   Family History:  The patient's family history includes Heart disease in his father and mother; Hypertension in his father and mother; Lung cancer in his brother.    ROS:  Please see the history of present illness.   Otherwise, review of systems are positive for difficulty stopping smoking.   All other systems are reviewed and negative.    PHYSICAL EXAM: VS:  BP 130/66   Pulse 97   Ht 5' 11"  (1.803 m)   Wt  226 lb 6.4 oz (102.7 kg)   SpO2 97%   BMI 31.58 kg/m  , BMI Body mass index is 31.58 kg/m. GEN: Well nourished, well developed, in no acute distress  HEENT: normal  Neck: no JVD, carotid bruits, or masses Cardiac: RRR; no murmurs, rubs, or gallops,; bilateral LE edema 1+ pitting bilaterally Respiratory:  clear to auscultation bilaterally, normal work of breathing GI: soft, nontender, nondistended, + BS MS: no deformity or atrophy  Skin: warm and dry, no rash Neuro:  Strength and sensation are intact Psych: euthymic mood, full affect   EKG:   The ekg ordered in 6/20 demonstrates NSR, no ST changes   Recent Labs: 11/19/2018: B Natriuretic Peptide 8.0 04/30/2019: ALT 61; BUN 11; Creatinine, Ser 1.17; Potassium 3.8; Sodium 141 09/03/2019: Hemoglobin 13.3; Platelets 171   Lipid Panel    Component Value Date/Time   CHOL 133 04/30/2019 1634   TRIG 123 04/30/2019 1634   HDL 44 04/30/2019 1634   CHOLHDL 3.0 04/30/2019 1634   LDLCALC 67 04/30/2019 1634     Other studies Reviewed: Additional studies/ records that were reviewed today with results demonstrating: TC in 2020 was 133, HDL 44.   ASSESSMENT AND PLAN:  1.   CAD: Angina controlled on medical therapy.  Decrease salt intake.  Healthy diet info given.   2.   Hyperlipidemia: The current medical regimen is effective;  continue present plan and medications. 3.   Tobacco abuse: Nicotine patches at home, but he does not use them. 4.   HTN: The current medical regimen is effective;  continue present plan and medications. 5.   Chronic diastolic heart failure: Has some orthopnea sx and  leg swelling.  Increase Lasix to 40 mg 4x/week. Continue potassium. Hopefully leg swelling will come down and breathing will improve.    Current medicines are reviewed at length with the patient today.  The patient concerns regarding his medicines were addressed.  The following changes have been made:  Increase Lasix to 4x/week.  Labs/ tests  ordered today include:  No orders of the defined types were placed in this encounter.   Recommend 150 minutes/week of aerobic exercise Low fat, low carb, high fiber diet recommended  Disposition:   FU in 1 year   Signed, Larae Grooms, MD  09/09/2019 2:13 PM    [facility] [address], [place], [place]  [address] Phone: [phone]; Fax: [phone]

## 2019-09-09 ENCOUNTER — Other Ambulatory Visit: Payer: Self-pay

## 2019-09-09 ENCOUNTER — Ambulatory Visit (INDEPENDENT_AMBULATORY_CARE_PROVIDER_SITE_OTHER): Payer: Medicare Other | Admitting: Interventional Cardiology

## 2019-09-09 ENCOUNTER — Encounter: Payer: Self-pay | Admitting: Interventional Cardiology

## 2019-09-09 VITALS — BP 130/66 | HR 97 | Ht 71.0 in | Wt 226.4 lb

## 2019-09-09 DIAGNOSIS — Z720 Tobacco use: Secondary | ICD-10-CM | POA: Diagnosis not present

## 2019-09-09 DIAGNOSIS — I5032 Chronic diastolic (congestive) heart failure: Secondary | ICD-10-CM | POA: Diagnosis not present

## 2019-09-09 DIAGNOSIS — E782 Mixed hyperlipidemia: Secondary | ICD-10-CM

## 2019-09-09 DIAGNOSIS — I25119 Atherosclerotic heart disease of native coronary artery with unspecified angina pectoris: Secondary | ICD-10-CM | POA: Diagnosis not present

## 2019-09-09 DIAGNOSIS — I10 Essential (primary) hypertension: Secondary | ICD-10-CM

## 2019-09-09 MED ORDER — FUROSEMIDE 40 MG PO TABS: ORAL_TABLET | ORAL | 3 refills | Status: DC

## 2019-09-09 NOTE — Patient Instructions (Signed)
Medication Instructions:  Your physician has recommended you make the following change in your medication:   INCREASE: furosemide (lasix) to 40 mg 4 times a week  *If you need a refill on your cardiac medications before your next appointment, please call your pharmacy*   Lab Work: None ordered  If you have labs (blood work) drawn today and your tests are completely normal, you will receive your results only by: Marland Kitchen MyChart Message (if you have MyChart) OR . A paper copy in the mail If you have any lab test that is abnormal or we need to change your treatment, we will call you to review the results.   Testing/Procedures: None ordered   Follow-Up: At [facility], you and your health needs are our priority.  As part of our continuing mission to provide you with exceptional heart care, we have created designated Provider Care Teams.  These Care Teams include your primary Cardiologist (physician) and Advanced Practice Providers (APPs -  Physician Assistants and Nurse Practitioners) who all work together to provide you with the care you need, when you need it.  We recommend signing up for the patient portal called "MyChart".  Sign up information is provided on this After Visit Summary.  MyChart is used to connect with patients for Virtual Visits (Telemedicine).  Patients are able to view lab/test results, encounter notes, upcoming appointments, etc.  Non-urgent messages can be sent to your provider as well.   To learn more about what you can do with MyChart, go to [email or web address].    Your next appointment:   12 month(s)  The format for your next appointment:   In Person  Provider:   You may see Larae Grooms, MD or one of the following Advanced Practice Providers on your designated Care Team:    Melina Copa, PA-C  Ermalinda Barrios, PA-C    Other Instructions  Low-Sodium Eating Plan Sodium, which is an element that makes up salt, helps you maintain a healthy balance of  fluids in your body. Too much sodium can increase your blood pressure and cause fluid and waste to be held in your body. Your health care provider or dietitian may recommend following this plan if you have high blood pressure (hypertension), kidney disease, liver disease, or heart failure. Eating less sodium can help lower your blood pressure, reduce swelling, and protect your heart, liver, and kidneys. What are tips for following this plan? General guidelines  Most people on this plan should limit their sodium intake to 1,500-2,000 mg (milligrams) of sodium each day. Reading food labels   The Nutrition Facts label lists the amount of sodium in one serving of the food. If you eat more than one serving, you must multiply the listed amount of sodium by the number of servings.  Choose foods with less than 140 mg of sodium per serving.  Avoid foods with 300 mg of sodium or more per serving. Shopping  Look for lower-sodium products, often labeled as "low-sodium" or "no salt added."  Always check the sodium content even if foods are labeled as "unsalted" or "no salt added".  Buy fresh foods. ? Avoid canned foods and premade or frozen meals. ? Avoid canned, cured, or processed meats  Buy breads that have less than 80 mg of sodium per slice. Cooking  Eat more home-cooked food and less restaurant, buffet, and fast food.  Avoid adding salt when cooking. Use salt-free seasonings or herbs instead of table salt or sea salt. Check with your health care  provider or pharmacist before using salt substitutes.  Cook with plant-based oils, such as canola, sunflower, or olive oil. Meal planning  When eating at a restaurant, ask that your food be prepared with less salt or no salt, if possible.  Avoid foods that contain MSG (monosodium glutamate). MSG is sometimes added to [place] food, bouillon, and some canned foods. What foods are recommended? The items listed may not be a complete list. Talk with  your dietitian about what dietary choices are best for you. Grains Low-sodium cereals, including oats, puffed wheat and rice, and shredded wheat. Low-sodium crackers. Unsalted rice. Unsalted pasta. Low-sodium bread. Whole-grain breads and whole-grain pasta. Vegetables Fresh or frozen vegetables. "No salt added" canned vegetables. "No salt added" tomato sauce and paste. Low-sodium or reduced-sodium tomato and vegetable juice. Fruits Fresh, frozen, or canned fruit. Fruit juice. Meats and other protein foods Fresh or frozen (no salt added) meat, poultry, seafood, and fish. Low-sodium canned tuna and salmon. Unsalted nuts. Dried peas, beans, and lentils without added salt. Unsalted canned beans. Eggs. Unsalted nut butters. Dairy Milk. Soy milk. Cheese that is naturally low in sodium, such as ricotta cheese, fresh mozzarella, or Swiss cheese Low-sodium or reduced-sodium cheese. Cream cheese. Yogurt. Fats and oils Unsalted butter. Unsalted margarine with no trans fat. Vegetable oils such as canola or olive oils. Seasonings and other foods Fresh and dried herbs and spices. Salt-free seasonings. Low-sodium mustard and ketchup. Sodium-free salad dressing. Sodium-free light mayonnaise. Fresh or refrigerated horseradish. Lemon juice. Vinegar. Homemade, reduced-sodium, or low-sodium soups. Unsalted popcorn and pretzels. Low-salt or salt-free chips. What foods are not recommended? The items listed may not be a complete list. Talk with your dietitian about what dietary choices are best for you. Grains Instant hot cereals. Bread stuffing, pancake, and biscuit mixes. Croutons. Seasoned rice or pasta mixes. Noodle soup cups. Boxed or frozen macaroni and cheese. Regular salted crackers. Self-rising flour. Vegetables Sauerkraut, pickled vegetables, and relishes. Olives. [place] fries. Onion rings. Regular canned vegetables (not low-sodium or reduced-sodium). Regular canned tomato sauce and paste (not low-sodium or  reduced-sodium). Regular tomato and vegetable juice (not low-sodium or reduced-sodium). Frozen vegetables in sauces. Meats and other protein foods Meat or fish that is salted, canned, smoked, spiced, or pickled. Bacon, ham, sausage, hotdogs, corned beef, chipped beef, packaged lunch meats, salt pork, jerky, pickled herring, anchovies, regular canned tuna, sardines, salted nuts. Dairy Processed cheese and cheese spreads. Cheese curds. Blue cheese. Feta cheese. String cheese. Regular cottage cheese. Buttermilk. Canned milk. Fats and oils Salted butter. Regular margarine. Ghee. Bacon fat. Seasonings and other foods Onion salt, garlic salt, seasoned salt, table salt, and sea salt. Canned and packaged gravies. Worcestershire sauce. Tartar sauce. Barbecue sauce. Teriyaki sauce. Soy sauce, including reduced-sodium. Steak sauce. Fish sauce. Oyster sauce. Cocktail sauce. Horseradish that you find on the shelf. Regular ketchup and mustard. Meat flavorings and tenderizers. Bouillon cubes. Hot sauce and Tabasco sauce. Premade or packaged marinades. Premade or packaged taco seasonings. Relishes. Regular salad dressings. Salsa. Potato and tortilla chips. Corn chips and puffs. Salted popcorn and pretzels. Canned or dried soups. Pizza. Frozen entrees and pot pies. Summary  Eating less sodium can help lower your blood pressure, reduce swelling, and protect your heart, liver, and kidneys.  Most people on this plan should limit their sodium intake to 1,500-2,000 mg (milligrams) of sodium each day.  Canned, boxed, and frozen foods are high in sodium. Restaurant foods, fast foods, and pizza are also very high in sodium. You also get sodium by  adding salt to food.  Try to cook at home, eat more fresh fruits and vegetables, and eat less fast food, canned, processed, or prepared foods. This information is not intended to replace advice given to you by your health care provider. Make sure you discuss any questions you have  with your health care provider. Document Revised: 05/18/2017 Document Reviewed: 05/29/2016 Elsevier Patient Education  2020 [employer].

## 2019-09-11 ENCOUNTER — Telehealth: Payer: Self-pay

## 2019-09-11 ENCOUNTER — Other Ambulatory Visit: Payer: Self-pay

## 2019-09-11 ENCOUNTER — Emergency Department (HOSPITAL_BASED_OUTPATIENT_CLINIC_OR_DEPARTMENT_OTHER)
Admission: EM | Admit: 2019-09-11 | Discharge: 2019-09-11 | Disposition: A | Discharge status: Home / Self Care | Payer: Medicare Other | Attending: Emergency Medicine | Admitting: Emergency Medicine

## 2019-09-11 ENCOUNTER — Encounter (HOSPITAL_BASED_OUTPATIENT_CLINIC_OR_DEPARTMENT_OTHER): Payer: Self-pay

## 2019-09-11 DIAGNOSIS — R7303 Prediabetes: Secondary | ICD-10-CM | POA: Diagnosis not present

## 2019-09-11 DIAGNOSIS — Z8673 Personal history of transient ischemic attack (TIA), and cerebral infarction without residual deficits: Secondary | ICD-10-CM | POA: Insufficient documentation

## 2019-09-11 DIAGNOSIS — R519 Headache, unspecified: Secondary | ICD-10-CM | POA: Diagnosis not present

## 2019-09-11 DIAGNOSIS — Z7982 Long term (current) use of aspirin: Secondary | ICD-10-CM | POA: Diagnosis not present

## 2019-09-11 DIAGNOSIS — Z888 Allergy status to other drugs, medicaments and biological substances status: Secondary | ICD-10-CM | POA: Insufficient documentation

## 2019-09-11 DIAGNOSIS — E782 Mixed hyperlipidemia: Secondary | ICD-10-CM | POA: Diagnosis not present

## 2019-09-11 DIAGNOSIS — Z79899 Other long term (current) drug therapy: Secondary | ICD-10-CM | POA: Insufficient documentation

## 2019-09-11 DIAGNOSIS — I10 Essential (primary) hypertension: Secondary | ICD-10-CM | POA: Insufficient documentation

## 2019-09-11 DIAGNOSIS — Z7902 Long term (current) use of antithrombotics/antiplatelets: Secondary | ICD-10-CM | POA: Diagnosis not present

## 2019-09-11 DIAGNOSIS — Z9861 Coronary angioplasty status: Secondary | ICD-10-CM | POA: Diagnosis not present

## 2019-09-11 DIAGNOSIS — I2510 Atherosclerotic heart disease of native coronary artery without angina pectoris: Secondary | ICD-10-CM | POA: Diagnosis not present

## 2019-09-11 DIAGNOSIS — J449 Chronic obstructive pulmonary disease, unspecified: Secondary | ICD-10-CM | POA: Diagnosis not present

## 2019-09-11 DIAGNOSIS — Z880 Allergy status to penicillin: Secondary | ICD-10-CM | POA: Diagnosis not present

## 2019-09-11 DIAGNOSIS — Z86718 Personal history of other venous thrombosis and embolism: Secondary | ICD-10-CM | POA: Insufficient documentation

## 2019-09-11 MED ORDER — DEXAMETHASONE 6 MG PO TABS
10.0000 mg | ORAL_TABLET | Freq: Once | ORAL | Status: AC
  Administered 2019-09-11: 10 mg via ORAL
  Filled 2019-09-11: qty 1

## 2019-09-11 NOTE — Telephone Encounter (Signed)
Caretaker Chad Moss called pt is hurting in throat and ears she stated she took him to the hospital this past Saturday and is taking him back today. She is asking for a referral to be sent in for a TMJ doctor 

## 2019-09-11 NOTE — ED Provider Notes (Signed)
[facility] EMERGENCY DEPARTMENT Provider Note   CSN: [id number] Arrival date & time: 09/11/19  1739     History Chief Complaint  Patient presents with  . Headache    Chad Moss is a 64 y.o. male.  The history is provided by the patient.  Headache Pain location:  Frontal Quality:  Dull Radiates to:  Does not radiate Severity currently:  3/10 Severity at highest:  5/10 Onset quality:  Gradual Duration:  3 weeks Timing:  Intermittent Progression:  Waxing and waning Chronicity:  New Relieved by:  NSAIDs and acetaminophen Worsened by:  Nothing Associated symptoms: congestion, ear pain, sinus pressure and sore throat   Associated symptoms: no abdominal pain, no back pain, no blurred vision, no cough, no drainage, no eye pain, no fever, no seizures, no URI and no vomiting        Past Medical History:  Diagnosis Date  . Allergic rhinitis   . Anxiety   . Atelectasis   . CAD (coronary artery disease), native coronary artery    9/18 PCI/DES x1 to mLCx, mild diffuse nonobstructive disease, EF 55% on Lv gram  . Chronic bronchitis ([place])   . COPD (chronic obstructive pulmonary disease) ([place])   . Crohn's disease ([place])   . Depression   . DVT (deep venous thrombosis) ([facility])    RLE X 2  . Dysphagia   . Dyspnea   . Enlarged prostate   . GERD (gastroesophageal reflux disease)   . Glaucoma, both eyes   . Heart murmur   . History of stomach ulcers 1980s  . Hyperlipidemia   . Hypertension    "off RX for years now cause of coughing w/Lisinopril" (03/13/2017)  . IBS (irritable bowel syndrome)   . Paranoid schizophrenia ([place])   . Peripheral vascular disease ([place])   . Pneumonia 1990s  . Pre-diabetes    "one time" (03/13/2017)  . Stroke [facility]) 04/2016   "eye stroke; left eye" (03/13/2017)  . Syncopal episodes     Patient Active Problem List   Diagnosis Date Noted  . COVID-19 virus detected 11/25/2018  . Diarrhea 11/19/2018  . Fever in adult 11/19/2018  .  Inadequate community support 11/19/2018  . Edema 03/09/2018  . Abnormal weight gain 03/09/2018  . Essential hypertension   . Coronary artery disease involving native coronary artery of native heart with angina pectoris ([place]) 03/13/2017  . Coronary artery calcification seen on CAT scan 01/12/2017  . COPD mixed type ([place]) 09/13/2015  . Cough 08/09/2015  . Flu-like symptoms 08/09/2015  . Acute sinusitis 08/09/2015  . AP (abdominal pain) 07/09/2014  . COLD (chronic obstructive lung disease) ([place]) 07/09/2014  . COPD exacerbation ([place]) 06/01/2014  . Other malaise and fatigue 09/06/2013  . Encounter for screening for lung cancer 10/09/2012  . Chronic cough 11/19/2011  . Tobacco abuse 04/06/2011  . Mixed hyperlipidemia 01/27/2009  . HYPERTENSION 01/27/2009  . PERIPHERAL VASCULAR DISEASE 01/27/2009  . Allergic rhinitis 01/27/2009  . Other emphysema ([place]) 01/27/2009  . DIZZINESS, CHRONIC 01/27/2009  . SHORTNESS OF BREATH (SOB) 01/27/2009  . Crohn's disease without complication ([place]) 99/83/3825    Past Surgical History:  Procedure Laterality Date  . ABDOMINAL HERNIA REPAIR  1990s  . COLECTOMY  1985; ?1989; 1996   "part of my ileum removed"  . CORONARY ANGIOPLASTY WITH STENT PLACEMENT  03/13/2017  . CORONARY STENT INTERVENTION N/A 03/13/2017   Procedure: CORONARY STENT INTERVENTION;  Surgeon: Jettie Booze, MD;  Location: [facility];  Service: Cardiovascular;  Laterality: N/A;  . EYE SURGERY Right    "laser; for glaucoma" (03/13/2017)  . FASCIOTOMY Right   . HERNIA REPAIR    . LAPAROSCOPIC CHOLECYSTECTOMY    . LEFT HEART CATH AND CORONARY ANGIOGRAPHY N/A 03/13/2017   Procedure: LEFT HEART CATH AND CORONARY ANGIOGRAPHY;  Surgeon: Jettie Booze, MD;  Location: [facility];  Service: Cardiovascular;  Laterality: N/A;  . PENILE PROSTHESIS IMPLANT  01/2011   Archie Endo 02/01/2011  . PERIPHERAL VASCULAR CATHETERIZATION Right 1999   "had blood clots in my leg; went in  to open them up; dye went into leg; ended up having a fasiotomy"  . TONSILLECTOMY         Family History  Problem Relation Age of Onset  . Hypertension Mother   . Heart disease Mother   . Hypertension Father   . Heart disease Father   . Lung cancer Brother        2006    Social History   Tobacco Use  . Smoking status: Current Every Day Smoker    Packs/day: 1.00    Years: 50.00    Pack years: 50.00    Types: Cigarettes    Start date: 11/18/1968  . Smokeless tobacco: Never Used  . Tobacco comment: currently smoking 1ppd  Substance Use Topics  . Alcohol use: No    Alcohol/week: 0.0 standard drinks  . Drug use: No    Home Medications Prior to Admission medications   Medication Sig Start Date End Date Taking? Authorizing Provider  albuterol (PROAIR HFA) 108 (90 Base) MCG/ACT inhaler INHALE 2 PUFFS BY MOUTH INTO THE LUNGS EVERY 6 HOURS AS NEEDED FOR WHEEZING OR SHORTNESS OF BREATH 12/23/18   Brand Males, MD  albuterol (PROVENTIL) (2.5 MG/3ML) 0.083% nebulizer solution Take 3 mLs (2.5 mg total) by nebulization every 6 (six) hours as needed for wheezing or shortness of breath. 07/18/18   Brand Males, MD  alfuzosin (UROXATRAL) 10 MG 24 hr tablet Take 10 mg by mouth at bedtime.     [provider]  amLODipine (NORVASC) 5 MG tablet TAKE 1 TABLET(5 MG) BY MOUTH DAILY 11/22/18   Jettie Booze, MD  aspirin EC 81 MG tablet Take 81 mg by mouth daily.    [provider]  bimatoprost (LUMIGAN) 0.01 % SOLN Place 1 drop into the left eye at bedtime.    [provider]  brimonidine (ALPHAGAN) 0.15 % ophthalmic solution Place 1 drop into the left eye 2 (two) times daily.     [provider]  cholestyramine Lucrezia Starch) 4 G packet Take 1 packet by mouth daily.     [provider]  clonazePAM (KLONOPIN) 1 MG tablet Take 1 mg by mouth at bedtime. Anxiety    [provider]  clopidogrel (PLAVIX) 75 MG tablet Take 75 mg by mouth daily.     [provider]  diazepam (VALIUM) 2 MG tablet Take 1 tablet (2 mg total) by mouth 2 (two) times daily as needed for anxiety. 09/06/19   Couture, Cortni S, PA-C  dicyclomine (BENTYL) 10 MG capsule Take 10 mg by mouth 4 (four) times daily -  before meals and at bedtime.     [provider]  diphenhydrAMINE (BENADRYL) 25 MG tablet Take 50 mg by mouth at bedtime.     [provider]  finasteride (PROSCAR) 5 MG tablet Take 5 mg by mouth daily.    [provider]  furosemide (LASIX) 40 MG tablet Take 1 tablet 4  times a week 09/09/19   Jettie Booze, MD  isosorbide mononitrate (IMDUR) 30 MG 24 hr tablet TAKE 1 TABLET(30 MG) BY MOUTH DAILY 11/22/18   Jettie Booze, MD  loratadine (CLARITIN) 10 MG tablet Take 1 tablet (10 mg total) by mouth daily. 09/03/19 09/02/20  [name], FNP  loxapine (LOXITANE) 10 MG capsule Take 10 mg by mouth 2 (two) times daily. 1 tab in AM and 2 tabs in PM    [provider]  mercaptopurine (PURINETHOL) 50 MG tablet Take 75 mg by mouth daily. 1 tablet and a half tablet Give on an empty stomach 1 hour before or 2 hours after meals. Caution: Chemotherapy.    [provider]  mesalamine (PENTASA) 250 MG CR capsule Take 1,000 mg by mouth 4 (four) times daily.     [provider]  mirtazapine (REMERON) 30 MG tablet Take 45 mg by mouth at bedtime. 1.5 tablets by mouth daily 01/08/17   [provider]  nitroGLYCERIN (NITROSTAT) 0.4 MG SL tablet Place 1 tablet (0.4 mg total) under the tongue every 5 (five) minutes x 3 doses as needed for chest pain. 03/31/19   Jettie Booze, MD  pantoprazole (PROTONIX) 40 MG tablet TAKE 1 TABLET(40 MG) BY MOUTH TWICE DAILY 04/01/19   Jettie Booze, MD  phenol (CHLORASEPTIC GARGLE) 1.4 % LIQD Use as directed 1 spray in the mouth or throat as needed for throat irritation / pain. 09/06/19   Couture, Cortni S, PA-C  potassium chloride (K-DUR) 10 MEQ tablet Take  1 tablet (10 mEq total) by mouth 2 (two) times daily. 08/01/17   Jettie Booze, MD  rosuvastatin (CRESTOR) 20 MG tablet TAKE 1 TABLET(20 MG) BY MOUTH DAILY 11/22/18   Jettie Booze, MD  SPIRIVA HANDIHALER 18 MCG inhalation capsule INHALE THE CONTENT OF 1 CAPSULE VIA HANDIHALER DAILY 07/28/19   Brand Males, MD  timolol (TIMOPTIC) 0.5 % ophthalmic solution Place 1 drop into the left eye 2 (two) times daily. 07/21/16   [provider]  traZODone (DESYREL) 50 MG tablet Take 150 mg by mouth at bedtime.    [provider]    Allergies    Benztropine, Codeine, Meperidine, Cyclobenzaprine, Penicillins, Sulfamethoxazole-trimethoprim, and Sulfonamide derivatives  Review of Systems   Review of Systems  Constitutional: Negative for chills and fever.  HENT: Positive for congestion, ear pain, sinus pressure and sore throat. Negative for dental problem, drooling, mouth sores, nosebleeds and postnasal drip.   Eyes: Negative for blurred vision, pain and visual disturbance.  Respiratory: Negative for cough and shortness of breath.   Cardiovascular: Negative for chest pain and palpitations.  Gastrointestinal: Negative for abdominal pain and vomiting.  Genitourinary: Negative for dysuria and hematuria.  Musculoskeletal: Negative for arthralgias and back pain.  Skin: Negative for color change and rash.  Neurological: Positive for headaches. Negative for seizures and syncope.  All other systems reviewed and are negative.   Physical Exam Updated Vital Signs BP (!) 172/84 (BP Location: Left Arm)   Pulse 98   Temp 98.2 F (36.8 C) (Oral)   Resp 18   Ht 5' 11"  (1.803 m)   Wt 100.2 kg   SpO2 99%   BMI 30.82 kg/m   Physical Exam Vitals and nursing note reviewed.  Constitutional:      Appearance: He is well-developed.  HENT:     Head: Normocephalic and atraumatic.     Comments: TMs clear    Mouth/Throat:     Mouth: Mucous  membranes are moist.  Eyes:     Extraocular  Movements: Extraocular movements intact.     Conjunctiva/sclera: Conjunctivae normal.  Cardiovascular:     Rate and Rhythm: Normal rate and regular rhythm.     Heart sounds: Normal heart sounds. No murmur.  Pulmonary:     Effort: Pulmonary effort is normal. No respiratory distress.     Breath sounds: Normal breath sounds.  Abdominal:     Palpations: Abdomen is soft.     Tenderness: There is no abdominal tenderness.  Musculoskeletal:        General: Normal range of motion.     Cervical back: Normal range of motion and neck supple.  Skin:    General: Skin is warm and dry.     Capillary Refill: Capillary refill takes less than 2 seconds.  Neurological:     Mental Status: He is alert and oriented to person, place, and time.     Cranial Nerves: No cranial nerve deficit.     Sensory: No sensory deficit.     Motor: No weakness.     Coordination: Coordination normal.  Psychiatric:        Mood and Affect: Mood normal.     ED Results / Procedures / Treatments   Labs (all labs ordered are listed, but only abnormal results are displayed) Labs Reviewed - No data to display  EKG None  Radiology No results found.  Procedures Procedures (including critical care time)  Medications Ordered in ED Medications  dexamethasone (DECADRON) tablet 10 mg (has no administration in time range)    ED Course  I have reviewed the triage vital signs and the nursing notes.  Pertinent labs & imaging results that were available during my care of the patient were reviewed by me and considered in my medical decision making (see chart for details).    MDM Rules/Calculators/A&P                      Chad Moss is a 64 year old male with history of high cholesterol, allergies, COPD presents to the ED with bilateral ear pain, nasal congestion, scratchy throat, frontal headache.  Patient with unremarkable vitals.  No fever.  Neurologically intact.  Seen for same and likely allergies. Had negative  strep test.  He has been using Benadryl, Flonase, Tylenol, Motrin with some improvement.  No signs of ear infection on exam.  No sign of throat infection on exam.  History and physicals consistent with a sinus headache likely in the setting of allergies.  Given an additional dose of Decadron.  Recommend follow-up with primary care doctor and discharged from the ED in good condition.  No concern for stroke or other infectious process.  This chart was dictated using voice recognition software.  Despite best efforts to proofread,  errors can occur which can change the documentation meaning.   Final Clinical Impression(s) / ED Diagnoses Final diagnoses:  Sinus headache    Rx / DC Orders ED Discharge Orders    None       Lennice Sites, DO 09/11/19 1842

## 2019-09-11 NOTE — Telephone Encounter (Signed)
She is working on the referral, I do believe at the ER he was given a medication to help him with the possible TMJ does he have any left?

## 2019-09-11 NOTE — ED Triage Notes (Signed)
Pt c/o HA, bilat earache, sore throat x 4 weeks-states he was seen here Saturday and dx with TMJ-states he feels no better-NAD-steady gait

## 2019-09-11 NOTE — Telephone Encounter (Signed)
Caretaker Chad Moss called pt is hurting in throat and ears she stated she took him to the hospital this past Saturday and is taking him back today. She is asking for a referral to be sent in for a TMJ doctor

## 2019-09-16 ENCOUNTER — Other Ambulatory Visit: Payer: Self-pay

## 2019-09-16 MED ORDER — NITROGLYCERIN 0.4 MG SL SUBL: 0.4000 mg | SUBLINGUAL_TABLET | SUBLINGUAL | 6 refills | Status: DC | PRN

## 2019-09-17 NOTE — Telephone Encounter (Signed)
3rd attempt to contact pt LVM

## 2019-10-06 DIAGNOSIS — R070 Pain in throat: Secondary | ICD-10-CM | POA: Diagnosis not present

## 2019-10-06 DIAGNOSIS — H9203 Otalgia, bilateral: Secondary | ICD-10-CM | POA: Diagnosis not present

## 2019-10-13 ENCOUNTER — Telehealth: Payer: Self-pay | Admitting: Nurse Practitioner

## 2019-10-13 NOTE — Chronic Care Management (AMB) (Signed)
  Chronic Care Management   Outreach Note  10/13/2019 Name: Chad Moss MRN: [id number] DOB: 04-01-1956  Chad Moss is a 64 y.o. year old male who is a primary care patient of [name], [place]. I reached out to Chad Moss by phone today in response to a referral sent by Chad Moss's health plan.     An unsuccessful telephone outreach was attempted today. The patient was referred to the case management team for assistance with care management and care coordination.   Follow Up Plan: A HIPPA compliant phone message was left for the patient providing contact information and requesting a return call. The care management team will reach out to the patient again over the next 7 days.  If patient returns call to provider office, please advise to call [place] at [phone].  [place], [place] [address] Direct Dial: [phone] [email or web address]: [email or web address]

## 2019-10-15 NOTE — Chronic Care Management (AMB) (Signed)
  Chronic Care Management   Outreach Note  10/15/2019 Name: Chad Moss MRN: [id number] DOB: 1955/10/27  Chad Norris Wearing is a 64 y.o. year old male who is a primary care patient of [name], [place]. I reached out to Chad Moss by phone today in response to a referral sent by Chad Moss's health plan.     A second telephone outreach was attempted today to schedule with Pharmacist, spoke with patient spouse she is not home and will have patient call back. The patient was referred to the case management team for assistance with care management and care coordination.   Follow Up Plan: A HIPPA compliant phone message was left for the patient providing contact information and requesting a return call. The care management team will reach out to the patient again over the next 7 days. If patient returns call to provider office, please advise to call [name] at [phone].  [place], [place] [address] Direct Dial: [phone] [email or web address]: [email or web address]

## 2019-10-20 NOTE — Chronic Care Management (AMB) (Signed)
  Chronic Care Management   Note  10/20/2019 Name: Chad Moss MRN: [id number] DOB: 1956/02/16  Chad Moss is a 64 y.o. year old male who is a primary care patient of [name], [place]. I reached out to Chad Moss by phone today in response to a referral sent by Chad Moss's health plan.     [name] was given information about Chronic Care Management services today including:  1. CCM service includes personalized support from designated clinical staff supervised by his physician, including individualized plan of care and coordination with other care providers 2. 24/7 contact phone numbers for assistance for urgent and routine care needs. 3. Service will only be billed when office clinical staff spend 20 minutes or more in a month to coordinate care. 4. Only one practitioner may furnish and bill the service in a calendar month. 5. The patient may stop CCM services at any time (effective at the end of the month) by phone call to the office staff. 6. The patient will be responsible for cost sharing (co-pay) of up to 20% of the service fee (after annual deductible is met).  Patient agreed to services and verbal consent obtained.   Follow up plan: Telephone appointment with care management team member scheduled for:11/06/2019  [place], [place], [place] [address] Direct Dial: [place].[email or web address] Website: [email or web address]

## 2019-10-25 ENCOUNTER — Other Ambulatory Visit: Payer: Self-pay | Admitting: Primary Care

## 2019-11-06 ENCOUNTER — Other Ambulatory Visit: Payer: Self-pay

## 2019-11-06 ENCOUNTER — Telehealth: Payer: Medicare Other

## 2019-11-06 MED ORDER — NITROGLYCERIN 0.4 MG SL SUBL: 0.4000 mg | SUBLINGUAL_TABLET | SUBLINGUAL | 9 refills | Status: DC | PRN

## 2019-11-06 NOTE — Chronic Care Management (AMB) (Deleted)
Chronic Care Management Pharmacy  Name: Chad Moss  MRN: [id number] DOB: 05-Jan-1956  Chief Complaint/ HPI  Chad Moss,  64 y.o. , male presents for their Initial CCM visit with the clinical pharmacist via telephone due to COVID-19 Pandemic.  PCP : [name], FNP  Their chronic conditions include: Hypertension, COPD, Mixed hyperlipidemia, CAD  Office Visits: 09/03/19 OV: Presented for bilateral ear pain beginning 3 weeks prior. Sore throat also present. Referred to ENT. Labs ordered (CBC). Loratadine Rx given. Toradol injection administered for pain. No clicking noted to TMJ area on exam, but pain with palpation to auricular area noted.   04/30/19 AWV and OV: Presented for prediabetes and HTN follow up. Pt being treated for cold by pulmonary doctor (robitussin, prednisone, and azithromycin for 2 weeks). Labs ordered (HgbA1c, CBC w/ Diff,  CMP14 +Anion Gap, CMP 14+EGFR, Lipid profile). Continue current medications and continue regular follow up with specialists for chronic conditinos  Consult Visit: 09/11/19 ED visit: Presented for headache, bilateral ear pain, nasal congestion, and scratchy throat. No fever. Exam and history consistent with sinus headache likely from allergies. Given Decadron. Follow up with PCP.   09/09/19 Cardiology OV w/ Dr. Irish Lack: Angina controlled on medical therapy. Increased furosemide 30m to 4 times weekly due to orthopnea and leg swelling. Continue other medications. Dietary and exercise recommendations provided.   09/06/19 ED visit: Presented for bilateral ear pain and sore throat for past 3 weeks. TMJ pain noted on exam (pt has had problems with TMJ in past). Pain suspected to be from TMJ dysfunction rather than infection. Diazepam, Decadron, and viscous lidocaine administered for pain and sore throat. Rx given for diazepam and Chloraseptic for symptoms.   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 Auditory and visual hallucinations present for years. Currently on loxapine 71mTDD with limited improvement in AH. Continue loxapine 2580mM, 58m75m for psychosis. Continue clonazepam 0.5mg 18m sleep (will consider tapering in future as this is not ideal medication for pt). Continue mirtazapine 45mg 110m depressive symptoms for sleep and mood. Continue trazodone 158mg, 30mdryl 58mg an62mrtazapine 45mg at 49mime for insomnia.   05/09/19 GI Televisit w/ Dr. Sheikh: PAlfredia Fergusoned for follow up for Chron's Disease and IBS. Pt in clinical and mucosal remission. Continue current therapy.   04/25/19 Pulmonology Televisit w/ E. Walsh, NPVolanda Napoleonsented for chest congestion and productive cough with yellow mucus for 2 weeks. Pt reported using nebulizer twice daily, Theraflu and Benadryl for symptoms. Started on Zpak and prednisone taper. Recommended Mucinex 600mg twic2mily. Continue Spiriva daily and albuterol HFA/nebs as needed. Flonase daily for sinusitis.   CCM Encounters:  Medications: Outpatient Encounter Medications as of 11/06/2019  Medication Sig  . albuterol (PROAIR HFA) 108 (90 Base) MCG/ACT inhaler INHALE 2 PUFFS BY MOUTH INTO THE LUNGS EVERY 6 HOURS AS NEEDED FOR WHEEZING OR SHORTNESS OF BREATH  . albuterol (PROVENTIL) (2.5 MG/3ML) 0.083% nebulizer solution Take 3 mLs (2.5 mg total) by nebulization every 6 (six) hours as needed for wheezing or shortness of breath.  . alfuzosin (UROXATRAL) 10 MG 24 hr tablet Take 10 mg by mouth at bedtime.   . amLODipiMarland Kitchene (NORVASC) 5 MG tablet TAKE 1 TABLET(5 MG) BY MOUTH DAILY  . aspirin EC 81 MG tablet Take 81 mg by mouth daily.  . bimatoprost (LUMIGAN) 0.01 % SOLN Place 1 drop into the left eye at bedtime.  . brimonidine (ALPHAGAN) 0.15 % ophthalmic  solution Place 1 drop into the left eye 2 (two) times daily.   . cholestyramine (QUESTRAN) 4 G packet Take 1 packet by mouth daily.   . clonazePAM (KLONOPIN) 1 MG tablet Take 1 mg by  mouth at bedtime. Anxiety  . clopidogrel (PLAVIX) 75 MG tablet Take 75 mg by mouth daily.  . diazepam (VALIUM) 2 MG tablet Take 1 tablet (2 mg total) by mouth 2 (two) times daily as needed for anxiety.  . dicyclomine (BENTYL) 10 MG capsule Take 10 mg by mouth 4 (four) times daily -  before meals and at bedtime.   . diphenhydrAMINE (BENADRYL) 25 MG tablet Take 50 mg by mouth at bedtime.   . finasteride (PROSCAR) 5 MG tablet Take 5 mg by mouth daily.  . fluticasone (FLONASE) 50 MCG/ACT nasal spray SHAKE LIQUID AND USE 1 SPRAY IN EACH NOSTRIL DAILY  . furosemide (LASIX) 40 MG tablet Take 1 tablet 4 times a week  . isosorbide mononitrate (IMDUR) 30 MG 24 hr tablet TAKE 1 TABLET(30 MG) BY MOUTH DAILY  . loratadine (CLARITIN) 10 MG tablet Take 1 tablet (10 mg total) by mouth daily.  [place] loxapine (LOXITANE) 10 MG capsule Take 10 mg by mouth 2 (two) times daily. 1 tab in AM and 2 tabs in PM  . mercaptopurine (PURINETHOL) 50 MG tablet Take 75 mg by mouth daily. 1 tablet and a half tablet Give on an empty stomach 1 hour before or 2 hours after meals. Caution: Chemotherapy.  . mesalamine (PENTASA) 250 MG CR capsule Take 1,000 mg by mouth 4 (four) times daily.   . mirtazapine (REMERON) 30 MG tablet Take 45 mg by mouth at bedtime. 1.5 tablets by mouth daily  . nitroGLYCERIN (NITROSTAT) 0.4 MG SL tablet Place 1 tablet (0.4 mg total) under the tongue every 5 (five) minutes x 3 doses as needed for chest pain.  . pantoprazole (PROTONIX) 40 MG tablet TAKE 1 TABLET(40 MG) BY MOUTH TWICE DAILY  . phenol (CHLORASEPTIC GARGLE) 1.4 % LIQD Use as directed 1 spray in the mouth or throat as needed for throat irritation / pain.  . potassium chloride (K-DUR) 10 MEQ tablet Take 1 tablet (10 mEq total) by mouth 2 (two) times daily.  . rosuvastatin (CRESTOR) 20 MG tablet TAKE 1 TABLET(20 MG) BY MOUTH DAILY  . SPIRIVA HANDIHALER 18 MCG inhalation capsule INHALE THE CONTENT OF 1 CAPSULE VIA HANDIHALER DAILY  . timolol (TIMOPTIC)  0.5 % ophthalmic solution Place 1 drop into the left eye 2 (two) times daily.  . traZODone (DESYREL) 50 MG tablet Take 150 mg by mouth at bedtime.   No facility-administered encounter medications on file as of 11/06/2019.    Current Diagnosis/Assessment:    Goals Addressed   None     COPD / Asthma / Tobacco   Last spirometry score: ***  Gold Grade: {CHL HP Upstream Pharm COPD Gold QPRFF:[id number]} Current COPD Classification:  {CHL HP Upstream Pharm COPD Classification:[phone]}  Eosinophil count:   Lab Results  Component Value Date/Time   EOSPCT 0 11/19/2018 03:15 PM  %                               Eos (Absolute):  Lab Results  Component Value Date/Time   EOSABS 0.0 11/19/2018 03:15 PM   EOSABS 0.0 10/09/2018 02:08 PM    Tobacco Status:  Social History   Tobacco Use  Smoking Status Current Every Day Smoker  . Packs/day:  1.00  . Years: 50.00  . Pack years: 50.00  . Types: Cigarettes  . Start date: 11/18/1968  Smokeless Tobacco Never Used  Tobacco Comment   currently smoking 1ppd    Patient has failed these meds in past: *** Patient is currently {CHL Controlled/Uncontrolled:[phone]} on the following medications:  -Proair 2 puffs every 6 hours as needed for wheezing or SOB -Albuterol nebulizer soln 2.28m/3ml every 6 hours as needed for wheezing or SOB -Spiriva handihaler 864m inhale 1 capsule via handihaler daily Using maintenance inhaler regularly? {yes/no:20286} Frequency of rescue inhaler use:  {CHL HP Upstream Pharm Inhaler FrQQVZ:[id number]}We discussed:  {CHL HP Upstream Pharmacy discussion:[phone]}  Plan  Continue {CHL HP Upstream Pharmacy Plans:[phone]}   Heart Failure   Type: Diastolic  Last ejection fraction: 55-65%  03/13/17 NYHA Class: {CHL HP Upstream Pharm NYHA Class:9036482177} AHA HF Stage: {CHL HP Upstream Pharm AHA HF Stage:[phone]}  Patient has failed these meds in past: *** Patient is currently {CHL  Controlled/Uncontrolled:865 042 8941} on the following medications:  -Isosorbide mononitrate 3036maily -Furosemide 66m66mtimes weekly -Potassium 10mE51mice daily  We discussed {CHL HP Upstream Pharmacy discussion:[phone]}  Plan  Continue {CHL HP Upstream Pharmacy PlansIRJJO:8416606301}ronary Artery Disease/ Peripheral Vascular Disease   Patient has failed these meds in past: *** Patient is currently {CHL Controlled/Uncontrolled:865 042 8941} on the following medications:  -Nitrogylcerin SL -Clopidogrel 75mg 32my  We discussed:  {CHL HP Upstream Pharmacy discussion:[phone]}  Plan  Continue {CHL HP Upstream Pharmacy Plans:[phone]}   Diabetes   Recent Relevant Labs: Lab Results  Component Value Date/Time   HGBA1C 6.0 (H) 04/30/2019 04:34 PM   HGBA1C 6.1 (H) 10/09/2018 02:08 PM   MICROALBUR 10 04/30/2019 04:20 PM     Checking BG: {CHL HP Blood Glucose Monitoring [phone]}  Recent FBG Readings: Recent pre-meal BG readings: *** Recent 2hr PP BG readings:  *** Recent HS BG readings: *** Patient has failed these meds in past: *** Patient is currently {CHL Controlled/Uncontrolled:865 042 8941} on the following medications: ***  Last diabetic Foot exam: No results found for: HMDIABEYEEXA  Last diabetic Eye exam: No results found for: HMDIABFOOTEX   We discussed: {CHL HP Upstream Pharmacy discussion:[phone]}  Plan  Continue {CHL HP Upstream Pharmacy Plans:[phone]}    Hypertension   BP today is:  {CHL HP UPSTREAM Pharmacist BP [phone]}  Office blood pressures are  BP Readings from Last 3 Encounters:  09/11/19 (!) 172/84  09/09/19 130/66  09/06/19 (!) 152/90    Patient has failed these meds in the past: *** Patient is currently {CHL Controlled/Uncontrolled:865 042 8941} on the following medications: -Amlodipine 5mg da28m  Patient checks BP at home {CHL HP BP Monitoring [phone]}  Patient home BP readings are  ranging: ***  We discussed {CHL HP Upstream Pharmacy discussion:[phone]}  Plan  Continue {CHL HP Upstream Pharmacy Plans:[phone]}   Hyperlipidemia   Lipid Panel     Component Value Date/Time   CHOL 133 04/30/2019 1634   TRIG 123 04/30/2019 1634   HDL 44 04/30/2019 1634   CHOLHDL 3.0 04/30/2019 1634   LDLCALC 67 04/30/2019 1634   LABVLDL 22 04/30/2019 1634     The 10-year ASCVD risk score (Goff DMikey Bussing, et al., 2013) is: 40%   Values used to calculate the score:     Age: 78 year57    Sex: Male     Is Non-Hispanic African American: Yes     Diabetic: No     Tobacco smoker: Yes     Systolic Blood Pressure: 172601  mmHg     Is BP treated: Yes     HDL Cholesterol: 44 mg/dL     Total Cholesterol: 133 mg/dL   Patient has failed these meds in past: *** Patient is currently {CHL Controlled/Uncontrolled:([phone] [phone]} on the following medications:  -Rosuvastatin 42m daily -Aspirin 897mdaily  We discussed:  {CHL HP Upstream Pharmacy [phone]}  Plan  Continue {CHL HP Upstream Pharmacy Plans:[phone]}    Eye   Patient has failed these meds in past: *** Patient is currently {CHL Controlled/Uncontrolled:([phone] [phone]} on the following medications:  -Bimatoprost (Lumigan) 0.01% soln 1 drop in left eye at bedtime -Brimonidine (Alphagan) 0.15% soln 1 drop in left eye twice daily -Timolol 0.5% soln 1 drop in left eye twice daily  We discussed:  {CHL HP Upstream Pharmacy [phone]}  Plan  Continue {CHL HP Upstream Pharmacy Plans:[phone]}  Psychosis and Insomnia   Patient has failed these meds in past: *** Patient is currently {CHL Controlled/Uncontrolled:([phone] [phone]} on the following medications:  -Loxapine (Loxitance) 1044mn the morning, 52m68m the evening -Diazepam 2mg 14mce daily as needed for anxiety---started for TMJ*** -Clonazepam 1mg a70medtime -Trazodone 50mg a85mdtime -Mirtazapine 30mg 1.73mblets at bedtime -Benadryl 50mg at 34mtime  We discussed:  {CHL HP Upstream Pharmacy [phone]}  Plan  Continue {CHL HP Upstream Pharmacy Plans:[phone]}  Chron's Disease and IBS   Patient has failed these meds in past: *** Patient is currently {CHL Controlled/Uncontrolled:([phone] [phone]} on the following medications:  -Pantoprazole 40mg twic79mily -Cholestyramine 4g packet once daily -Dicyclomine 10mg 4 tim67maily (before meals and at bedtime) -Mesalamine 250mg 4 caps73m 4 times daily -Mercaptopurine 50mg 1.5 tab44m 1 hr before or 2 hrs after meals  We discussed:  {CHL HP Upstream Pharmacy [phone]}  Plan  Continue {CHL HP Upstream Pharmacy Plans:[id number]:[id number]}Patient has failed these meds in past: *** Patient is currently {CHL Controlled/Uncontrolled:([phone] [phone]} on the following medications:  -Finasteride 5mg daily -Al13mosin 10mg at bedtim97me discussed:  {CHL HP Upstream Pharmacy [phone]}  Plan  Continue {CHL HP Upstream Pharmacy Plans:[id number]:[id number]}eviewed and discussed patient's vaccination history.    Immunization History  Administered Date(s) Administered  . Influenza Split 04/06/2011, 03/12/2012, 03/31/2015  . Influenza Whole 05/10/2009  . Influenza,inj,Quad PF,6+ Mos 03/04/2013, 04/02/2017, 03/28/2018  . Influenza-Unspecified 04/19/2014  . Pneumococcal Conjugate-13 01/27/2009  . Pneumococcal Polysaccharide-23 06/19/2008  . Tdap 12/27/2017    Plan  Recommended patient receive *** vaccine in *** office/pharmacy.   Medication Management  *[facility]? {Yes or If no, why not?:20788} Pt endorses ***% compliance  We discussed: ***  Plan  {US Pharmacy Plan:23885}    YHCW:23762}*** month phone visit  Shalisha Clausing CaudilJannette Fogoal Pharmacist [facility] [phone]

## 2019-11-07 ENCOUNTER — Telehealth: Payer: Self-pay | Admitting: Nurse Practitioner

## 2019-11-07 NOTE — Chronic Care Management (AMB) (Deleted)
  Care Management   Note  11/07/2019 Name: Chad Moss MRN: [id number] DOB: 08-23-1955  Chad Moss is a 64 y.o. year old male who is a primary care patient of [name], [place] and is actively engaged with the care management team. I reached out to Arnette Norris Cropley by phone today to assist with re-scheduling an initial visit with the Pharmacist.  Follow up plan: Unsuccessful telephone outreach attempt made. A HIPPA compliant phone message was left for the patient providing contact information and requesting a return call. The care management team will reach out to the patient again over the next 7 days.  If patient returns call to provider office, please advise to call [place] at [phone].  [place],  [address] Direct Dial: [phone] [email or web address] [email or web address] Website: [email or web address]

## 2019-11-12 ENCOUNTER — Telehealth: Payer: Self-pay | Admitting: Nurse Practitioner

## 2019-11-12 NOTE — Progress Notes (Signed)
Opened in Error under wrong Department

## 2019-11-12 NOTE — Chronic Care Management (AMB) (Signed)
  Chronic Care Management   Note  11/12/2019 Name: Chad Moss MRN: [id number] DOB: 06-Jul-1955  Chad Moss is a 64 y.o. year old male who is a primary care patient of Chad Moss, [place] and is actively engaged with the care management team. I reached out to Chad Moss by phone today to assist with re-scheduling an initial visit with the Pharmacist.  Follow up plan: Unsuccessful telephone outreach attempt made. A HIPPA compliant phone message was left for the patient providing contact information and requesting a return call. The care management team will reach out to the patient again over the next 7 days.  If patient returns call to provider office, please advise to call Chad Moss at [phone].  [place], [place] [address] Direct Dial: [phone] Chad Moss Website: [email or web address]

## 2019-11-18 ENCOUNTER — Other Ambulatory Visit: Payer: Self-pay | Admitting: *Deleted

## 2019-11-18 DIAGNOSIS — Z87891 Personal history of nicotine dependence: Secondary | ICD-10-CM

## 2019-11-18 DIAGNOSIS — F17210 Nicotine dependence, cigarettes, uncomplicated: Secondary | ICD-10-CM

## 2019-11-18 NOTE — Chronic Care Management (AMB) (Signed)
  Chronic Care Management   Note  11/18/2019 Name: Chad Moss MRN: [id number] DOB: 04/26/1956  [name] is a 64 y.o. year old male who is a primary care patient of [name], [place] and is actively engaged with the care management team. I reached out to Arnette Norris Hutzler by phone today to assist with re-scheduling an initial visit with the Pharmacist.  Follow up plan: A second unsuccessful telephone outreach attempt made. A HIPPA compliant phone message was left for the patient providing contact information and requesting a return call. The care management team will reach out to the patient again over the next 7 days. If patient returns call to provider office, please advise to call [place] at [phone].  [place], [place] [address] Direct Dial: [phone] Erline [email or web address] Website: [email or web address]

## 2019-11-20 ENCOUNTER — Other Ambulatory Visit: Payer: Self-pay

## 2019-11-20 MED ORDER — ROSUVASTATIN CALCIUM 20 MG PO TABS: ORAL_TABLET | ORAL | 3 refills | Status: DC

## 2019-11-20 MED ORDER — CLOPIDOGREL BISULFATE 75 MG PO TABS: 75.0000 mg | ORAL_TABLET | Freq: Every day | ORAL | 2 refills | Status: DC

## 2019-11-24 NOTE — Chronic Care Management (AMB) (Signed)
  Chronic Care Management   Note  11/24/2019 Name: BRAINARD HIGHFILL MRN: [id number] DOB: 06-12-1956  [name] is a 64 y.o. year old male who is a primary care patient of [name], [place] and is actively engaged with the care management team. I reached out to Arnette Norris Maahs by phone today to assist with re-scheduling an initial visit with the Pharmacist.  Follow up plan: Unsuccessful telephone outreach attempt made. A HIPPA compliant phone message was left for the patient providing contact information and requesting a return call. Unable to make contact on outreach attempts x 3. PCP [name], FNP. notified via routed documentation in medical record. The care management team is available to follow up with the patient after provider conversation with the patient regarding recommendation for care management engagement and subsequent re-referral to the care management team. If patient returns call to provider office, please advise to call [place] at [phone].  [place], [place] [address] Direct Dial: [phone] Erline [email or web address] Website: [email or web address]

## 2019-11-27 ENCOUNTER — Other Ambulatory Visit: Payer: Self-pay | Admitting: Nurse Practitioner

## 2019-11-27 DIAGNOSIS — K5090 Crohn's disease, unspecified, without complications: Secondary | ICD-10-CM

## 2019-11-27 LAB — CBC WITH DIFFERENTIAL/PLATELET
Basophils Absolute: 0 10*3/uL (ref 0.0–0.2)
Basos: 0 %
EOS (ABSOLUTE): 0.1 10*3/uL (ref 0.0–0.4)
Eos: 1 %
Hematocrit: 37.5 % (ref 37.5–51.0)
Hemoglobin: 13 g/dL (ref 13.0–17.7)
Immature Grans (Abs): 0 10*3/uL (ref 0.0–0.1)
Immature Granulocytes: 1 %
Lymphocytes Absolute: 1.2 10*3/uL (ref 0.7–3.1)
Lymphs: 19 %
MCH: 31.2 pg (ref 26.6–33.0)
MCHC: 34.7 g/dL (ref 31.5–35.7)
MCV: 90 fL (ref 79–97)
Monocytes Absolute: 0.5 10*3/uL (ref 0.1–0.9)
Monocytes: 7 %
Neutrophils Absolute: 4.6 10*3/uL (ref 1.4–7.0)
Neutrophils: 72 %
Platelets: 187 10*3/uL (ref 150–450)
RBC: 4.17 x10E6/uL (ref 4.14–5.80)
RDW: 14.8 % (ref 11.6–15.4)
WBC: 6.3 10*3/uL (ref 3.4–10.8)

## 2019-11-28 ENCOUNTER — Other Ambulatory Visit: Payer: Self-pay | Admitting: Nurse Practitioner

## 2019-11-28 LAB — CMP14+EGFR
ALT: 29 IU/L (ref 0–44)
AST: 36 IU/L (ref 0–40)
Albumin/Globulin Ratio: 1.3 (ref 1.2–2.2)
Albumin: 4 g/dL (ref 3.8–4.8)
Alkaline Phosphatase: 80 IU/L (ref 48–121)
BUN/Creatinine Ratio: 11 (ref 10–24)
BUN: 12 mg/dL (ref 8–27)
Bilirubin Total: <0.2 mg/dL (ref 0.0–1.2)
CO2: 21 mmol/L (ref 20–29)
Calcium: 9 mg/dL (ref 8.6–10.2)
Chloride: 102 mmol/L (ref 96–106)
Creatinine, Ser: 1.06 mg/dL (ref 0.76–1.27)
GFR calc Af Amer: 84 mL/min/{1.73_m2} (ref >59)
GFR calc non Af Amer: 73 mL/min/{1.73_m2} (ref >59)
Globulin, Total: 3 g/dL (ref 1.5–4.5)
Glucose: 129 mg/dL — ABNORMAL HIGH (ref 65–99)
Potassium: 4.2 mmol/L (ref 3.5–5.2)
Sodium: 139 mmol/L (ref 134–144)
Total Protein: 7 g/dL (ref 6.0–8.5)

## 2019-11-28 LAB — C-REACTIVE PROTEIN: CRP: 4 mg/L (ref 0–10)

## 2019-12-01 ENCOUNTER — Other Ambulatory Visit: Payer: Self-pay | Admitting: *Deleted

## 2019-12-01 MED ORDER — ISOSORBIDE MONONITRATE ER 30 MG PO TB24: ORAL_TABLET | ORAL | 2 refills | Status: DC

## 2019-12-08 ENCOUNTER — Telehealth: Payer: Self-pay | Admitting: Interventional Cardiology

## 2019-12-08 ENCOUNTER — Telehealth: Payer: Self-pay | Admitting: Nurse Practitioner

## 2019-12-08 NOTE — Telephone Encounter (Signed)
Dr. Andres Shad is requesting a call from Dr. Irish Lack or his nurse in regards to starting this patient on a beta blocker for Akathisia. He states she would start on a low dose but just wants to get the approval from her cardiologist. See staff message for his personal cell phone number.

## 2019-12-08 NOTE — Telephone Encounter (Signed)
Left voicemail for Dr. Posey Boyer with Psychiatry to return call.

## 2019-12-08 NOTE — Telephone Encounter (Signed)
Spoke with Dr. Andres Shad about wanting to start a low dose beta blocker for Akathisia. I feel at this time his blood pressure and heart rate would be able to handle the beta blocker and if he happens to have headaches this may help as well.

## 2019-12-09 ENCOUNTER — Other Ambulatory Visit: Payer: Self-pay

## 2019-12-09 MED ORDER — PANTOPRAZOLE SODIUM 40 MG PO TBEC: DELAYED_RELEASE_TABLET | ORAL | 2 refills | Status: DC

## 2019-12-09 NOTE — Telephone Encounter (Signed)
I left message for Dr. Andres Shad [phone] that it was ok to use a low dose beta blocker, based on the HR he had at our last visit.

## 2019-12-10 ENCOUNTER — Telehealth: Payer: Self-pay | Admitting: Acute Care

## 2019-12-10 ENCOUNTER — Other Ambulatory Visit: Payer: Self-pay

## 2019-12-10 ENCOUNTER — Ambulatory Visit: Admission: RE | Admit: 2019-12-10 | Payer: Medicare Other | Source: Ambulatory Visit

## 2019-12-10 ENCOUNTER — Encounter: Payer: Medicare Other | Admitting: Acute Care

## 2019-12-10 NOTE — Telephone Encounter (Signed)
I have called the CT at [facility]. There was no answer. I left a VM requesting that [name]' CT be cancelled as he was unable to make his shared decision making visit. I have left my contact number if they have any questions.  He will be rescheduled by our office 12/10/2019 12:17 PM

## 2019-12-10 NOTE — Patient Instructions (Signed)
Thank you for participating in the [place] Scre[facility] Program. It was our pleasure to meet you today. We will call you with the results of your scan within the next few days. Your scan will be assigned a Lung RADS category score by the physicians reading the scans.  This Lung RADS score determines follow up scanning.  See below for description of categories, and follow up screening recommendations. We will be in touch to schedule your follow up screening annually or based on recommendations of our providers. We will fax a copy of your scan results to your Primary Care Physician, or the physician who referred you to the program, to ensure they have the results. Please call the office if you have any questions or concerns regarding your scanning experience or results.  Our office number is [phone]. Please speak with Denise Phelps, RN. She is our Lung Cancer Screening RN. If she is unavailable when you call, please have the office staff send her a message. She will return your call at her earliest convenience. Remember, if your scan is normal, we will scan you annually as long as you continue to meet the criteria for the program. (Age 55-77, Current smoker or smoker who has quit within the last 15 years). If you are a smoker, remember, quitting is the single most powerful action that you can take to decrease your risk of lung cancer and other pulmonary, breathing related problems. We know quitting is hard, and we are here to help.  Please let us know if there is anything we can do to help you meet your goal of quitting. If you are a former smoker, congratulations. We are proud of you! Remain smoke free! Remember you can refer friends or family members through the number above.  We will screen them to make sure they meet criteria for the program. Thank you for helping us take better care of you by participating in Lung Screening.  Lung RADS Categories:  Lung RADS 1: no nodules  or definitely non-concerning nodules.  Recommendation is for a repeat annual scan in 12 months.  Lung RADS 2:  nodules that are non-concerning in appearance and behavior with a very low likelihood of becoming an active cancer. Recommendation is for a repeat annual scan in 12 months.  Lung RADS 3: nodules that are probably non-concerning , includes nodules with a low likelihood of becoming an active cancer.  Recommendation is for a 6-month repeat screening scan. Often noted after an upper respiratory illness. We will be in touch to make sure you have no questions, and to schedule your 6-month scan.  Lung RADS 4 A: nodules with concerning findings, recommendation is most often for a follow up scan in 3 months or additional testing based on our provider's assessment of the scan. We will be in touch to make sure you have no questions and to schedule the recommended 3 month follow up scan.  Lung RADS 4 B:  indicates findings that are concerning. We will be in touch with you to schedule additional diagnostic testing based on our provider's  assessment of the scan.   

## 2019-12-18 NOTE — Progress Notes (Signed)
Shared Decision Making Visit Lung Cancer Screening Program [id number])   Eligibility:  Age 64 y.o.  Pack Years Smoking History Calculation 50 pack year smoking history (# packs/per year x # years smoked)  Recent History of coughing up blood  no  Unexplained weight loss? no ( >Than 15 pounds within the last 6 months )  Prior History Lung / other cancer no (Diagnosis within the last 5 years already requiring surveillance chest CT Scans).  Smoking Status Current Smoker  Former Smokers: Years since quit: NA  Quit Date: NA  Visit Components:  Discussion included one or more decision making aids. yes  Discussion included risk/benefits of screening. yes  Discussion included potential follow up diagnostic testing for abnormal scans. yes  Discussion included meaning and risk of over diagnosis. yes  Discussion included meaning and risk of False Positives. yes  Discussion included meaning of total radiation exposure. yes  Counseling Included:  Importance of adherence to annual lung cancer LDCT screening. yes  Impact of comorbidities on ability to participate in the program. yes  Ability and willingness to under diagnostic treatment. yes  Smoking Cessation Counseling:  Current Smokers:   Discussed importance of smoking cessation. yes  Information about tobacco cessation classes and interventions provided to patient. yes  Patient provided with "ticket" for LDCT Scan. yes  Symptomatic Patient. no  Counseling  Diagnosis Code: Tobacco Use Z72.0  Asymptomatic Patient yes  Counseling (Intermediate counseling: > three minutes counseling) [id number]  Former Smokers:   Discussed the importance of maintaining cigarette abstinence. yes  Diagnosis Code: Personal History of Nicotine Dependence. Q76.226  Information about tobacco cessation classes and interventions provided to patient. Yes  Patient provided with "ticket" for LDCT Scan. yes  Written Order for Lung Cancer  Screening with LDCT placed in Epic. Yes (CT Chest Lung Cancer Screening Low Dose W/O CM) [id number] Z12.2-Screening of respiratory organs Z87.891-Personal history of nicotine dependence  I have spent 25 minutes of face to face time with Mr. Kaucher discussing the risks and benefits of lung cancer screening. We viewed a power point together that explained in detail the above noted topics. We paused at intervals to allow for questions to be asked and answered to ensure understanding.We discussed that the single most powerful action that he can take to decrease his risk of developing lung cancer is to quit smoking. We discussed whether or not he is ready to commit to setting a quit date. We discussed options for tools to aid in quitting smoking including nicotine replacement therapy, non-nicotine medications, support groups, Quit Smart classes, and behavior modification. We discussed that often times setting smaller, more achievable goals, such as eliminating 1 cigarette a day for a week and then 2 cigarettes a day for a week can be helpful in slowly decreasing the number of cigarettes smoked. This allows for a sense of accomplishment as well as providing a clinical benefit. I gave him the " Be Stronger Than Your Excuses" card with contact information for community resources, classes, free nicotine replacement therapy, and access to mobile apps, text messaging, and on-line smoking cessation help. I have also given him my card and contact information in the event he needs to contact me. We discussed the time and location of the scan, and that either Doroteo Glassman RN or I will call with the results within 24-48 hours of receiving them. I have offered him  a copy of the power point we viewed  as a resource in the event they need reinforcement of  the concepts we discussed today in the office. The patient verbalized understanding of all of  the above and had no further questions upon leaving the office. They have my  contact information in the event they have any further questions.  I spent 3 minutes counseling on smoking cessation and the health risks of continued tobacco abuse.  I explained to the patient that there has been a high incidence of coronary artery disease noted on these exams. I explained that this is a non-gated exam therefore degree or severity cannot be determined. This patient is on statin therapy. I have asked the patient to follow-up with their PCP regarding any incidental finding of coronary artery disease and management with diet or medication as their PCP  feels is clinically indicated. The patient verbalized understanding of the above and had no further questions upon completion of the visit.      Magdalen Spatz, NP

## 2019-12-29 ENCOUNTER — Telehealth: Payer: Self-pay | Admitting: Interventional Cardiology

## 2019-12-29 ENCOUNTER — Other Ambulatory Visit: Payer: Self-pay

## 2019-12-29 ENCOUNTER — Encounter (HOSPITAL_COMMUNITY): Payer: Self-pay

## 2019-12-29 ENCOUNTER — Emergency Department (HOSPITAL_COMMUNITY): Payer: Medicare Other

## 2019-12-29 ENCOUNTER — Emergency Department (HOSPITAL_COMMUNITY)
Admission: EM | Admit: 2019-12-29 | Discharge: 2019-12-29 | Disposition: A | Discharge status: Home / Self Care | Payer: Medicare Other | Attending: Emergency Medicine | Admitting: Emergency Medicine

## 2019-12-29 DIAGNOSIS — I25119 Atherosclerotic heart disease of native coronary artery with unspecified angina pectoris: Secondary | ICD-10-CM | POA: Insufficient documentation

## 2019-12-29 DIAGNOSIS — Z7982 Long term (current) use of aspirin: Secondary | ICD-10-CM | POA: Diagnosis not present

## 2019-12-29 DIAGNOSIS — J449 Chronic obstructive pulmonary disease, unspecified: Secondary | ICD-10-CM | POA: Insufficient documentation

## 2019-12-29 DIAGNOSIS — R0600 Dyspnea, unspecified: Secondary | ICD-10-CM | POA: Diagnosis not present

## 2019-12-29 DIAGNOSIS — I10 Essential (primary) hypertension: Secondary | ICD-10-CM | POA: Diagnosis not present

## 2019-12-29 DIAGNOSIS — Z79899 Other long term (current) drug therapy: Secondary | ICD-10-CM | POA: Diagnosis not present

## 2019-12-29 DIAGNOSIS — M25511 Pain in right shoulder: Secondary | ICD-10-CM | POA: Diagnosis not present

## 2019-12-29 DIAGNOSIS — Z7901 Long term (current) use of anticoagulants: Secondary | ICD-10-CM | POA: Diagnosis not present

## 2019-12-29 DIAGNOSIS — F17210 Nicotine dependence, cigarettes, uncomplicated: Secondary | ICD-10-CM | POA: Insufficient documentation

## 2019-12-29 DIAGNOSIS — Z7951 Long term (current) use of inhaled steroids: Secondary | ICD-10-CM | POA: Diagnosis not present

## 2019-12-29 DIAGNOSIS — R918 Other nonspecific abnormal finding of lung field: Secondary | ICD-10-CM | POA: Diagnosis not present

## 2019-12-29 DIAGNOSIS — Z743 Need for continuous supervision: Secondary | ICD-10-CM | POA: Diagnosis not present

## 2019-12-29 DIAGNOSIS — R0602 Shortness of breath: Secondary | ICD-10-CM | POA: Insufficient documentation

## 2019-12-29 DIAGNOSIS — R079 Chest pain, unspecified: Secondary | ICD-10-CM | POA: Diagnosis not present

## 2019-12-29 DIAGNOSIS — R6889 Other general symptoms and signs: Secondary | ICD-10-CM | POA: Diagnosis not present

## 2019-12-29 DIAGNOSIS — Z955 Presence of coronary angioplasty implant and graft: Secondary | ICD-10-CM | POA: Diagnosis not present

## 2019-12-29 DIAGNOSIS — R0789 Other chest pain: Secondary | ICD-10-CM | POA: Diagnosis not present

## 2019-12-29 DIAGNOSIS — R0781 Pleurodynia: Secondary | ICD-10-CM | POA: Diagnosis not present

## 2019-12-29 LAB — TROPONIN I (HIGH SENSITIVITY)
Troponin I (High Sensitivity): 3 ng/L (ref <18)
Troponin I (High Sensitivity): 4 ng/L (ref <18)

## 2019-12-29 LAB — CBC
HCT: 37.9 % — ABNORMAL LOW (ref 39.0–52.0)
Hemoglobin: 13.1 g/dL (ref 13.0–17.0)
MCH: 31.7 pg (ref 26.0–34.0)
MCHC: 34.6 g/dL (ref 30.0–36.0)
MCV: 91.8 fL (ref 80.0–100.0)
Platelets: 210 10*3/uL (ref 150–400)
RBC: 4.13 MIL/uL — ABNORMAL LOW (ref 4.22–5.81)
RDW: 14 % (ref 11.5–15.5)
WBC: 7.9 10*3/uL (ref 4.0–10.5)
nRBC: 0 % (ref 0.0–0.2)

## 2019-12-29 LAB — BASIC METABOLIC PANEL
Anion gap: 8 (ref 5–15)
BUN: 10 mg/dL (ref 8–23)
CO2: 27 mmol/L (ref 22–32)
Calcium: 9.1 mg/dL (ref 8.9–10.3)
Chloride: 104 mmol/L (ref 98–111)
Creatinine, Ser: 1.13 mg/dL (ref 0.61–1.24)
GFR calc Af Amer: >60 mL/min (ref >60)
GFR calc non Af Amer: >60 mL/min (ref >60)
Glucose, Bld: 94 mg/dL (ref 70–99)
Potassium: 4.1 mmol/L (ref 3.5–5.1)
Sodium: 139 mmol/L (ref 135–145)

## 2019-12-29 LAB — D-DIMER, QUANTITATIVE: D-Dimer, Quant: 0.75 ug/mL-FEU — ABNORMAL HIGH (ref 0.00–0.50)

## 2019-12-29 LAB — BRAIN NATRIURETIC PEPTIDE: B Natriuretic Peptide: 7.7 pg/mL (ref 0.0–100.0)

## 2019-12-29 IMAGING — CT CT ANGIO CHEST
2 of 6 series · 18 of 36 positions shown · IV contrast (omnipaque)
Comparison: None.

CLINICAL DATA: Dyspnea, left rib pain, positive D-dimer. Prior
history of stroke on chronic anticoagulation.

EXAM:
CT ANGIOGRAPHY CHEST WITH CONTRAST
TECHNIQUE: Multidetector CT imaging of the chest was performed using the
standard protocol during bolus administration of intravenous
contrast. Multiplanar CT image reconstructions and MIPs were
obtained to evaluate the vascular anatomy.
CONTRAST:  100mL OMNIPAQUE IOHEXOL 350 MG/ML SOLN

[Series 5: thins · axial · 0.66mm/px · z∈[-320,-78]mm · 17 of 275 slices shown]
[im 16/275  lung]
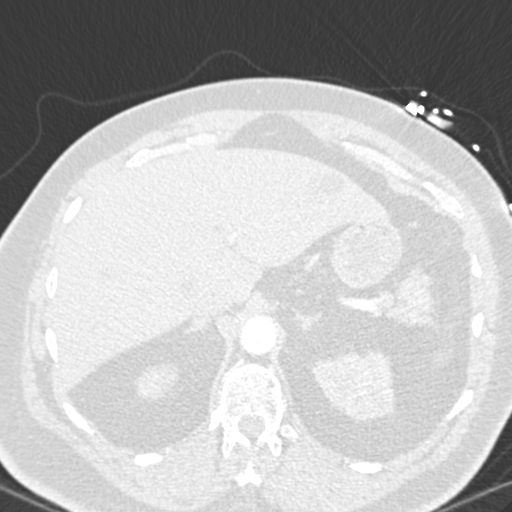
[im 31/275  mediastinal]
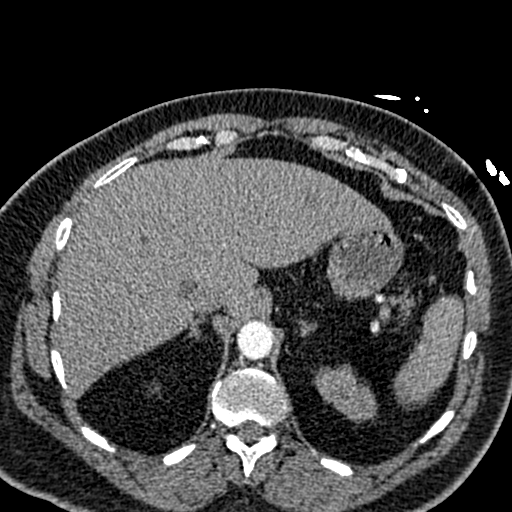
[im 46/275  lung]
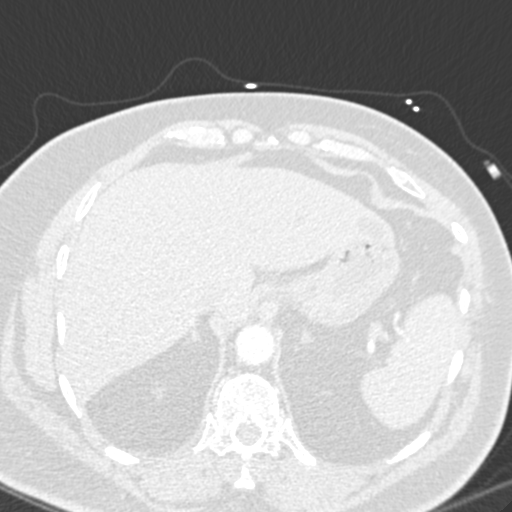
[im 61/275  mediastinal]
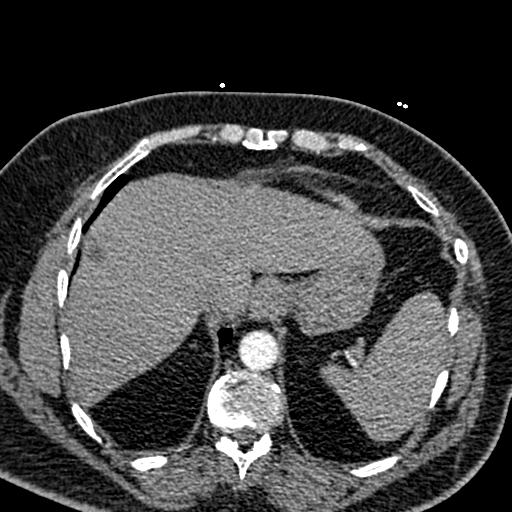
[im 77/275  lung]
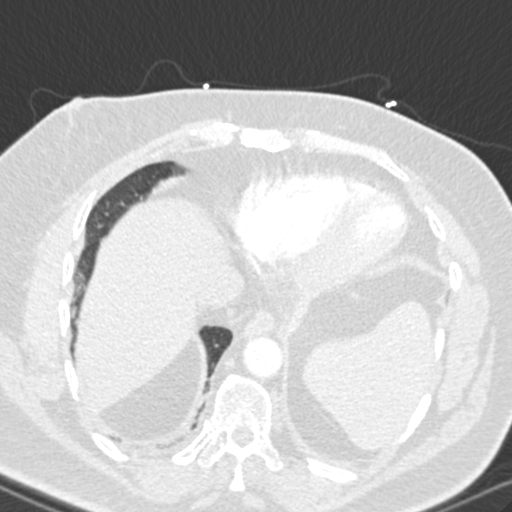
[im 92/275  mediastinal]
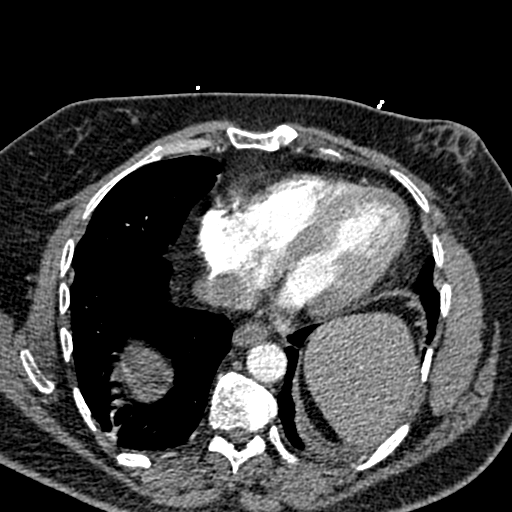
[im 107/275  lung]
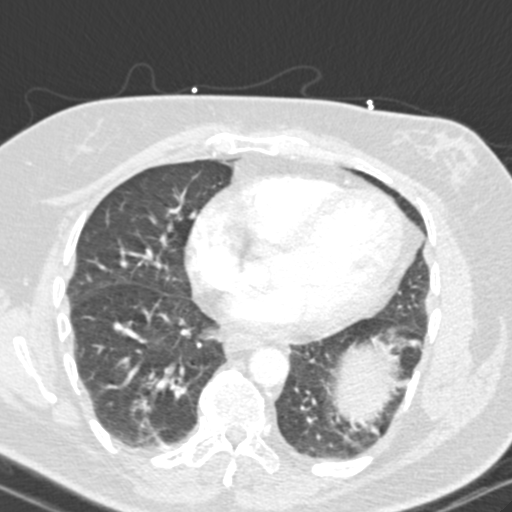
[im 122/275  mediastinal]
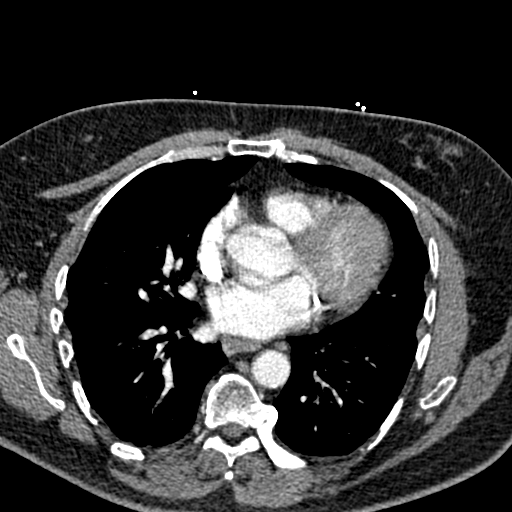
[im 138/275  lung]
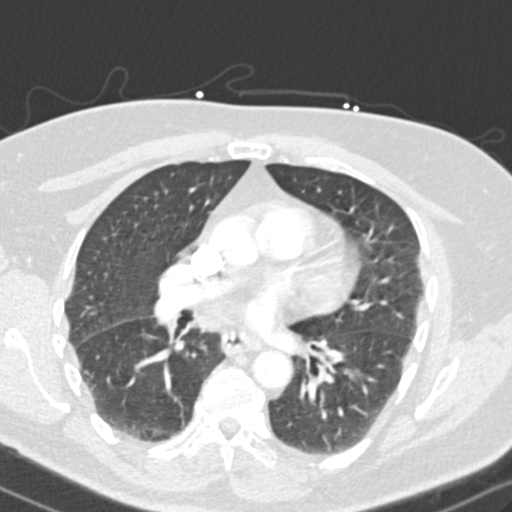
[im 153/275  mediastinal]
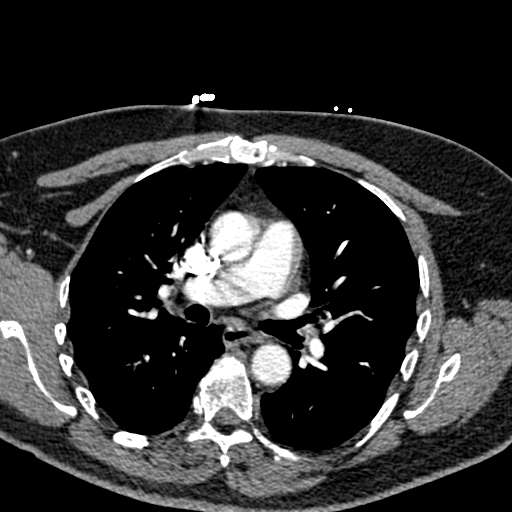
[im 168/275  lung]
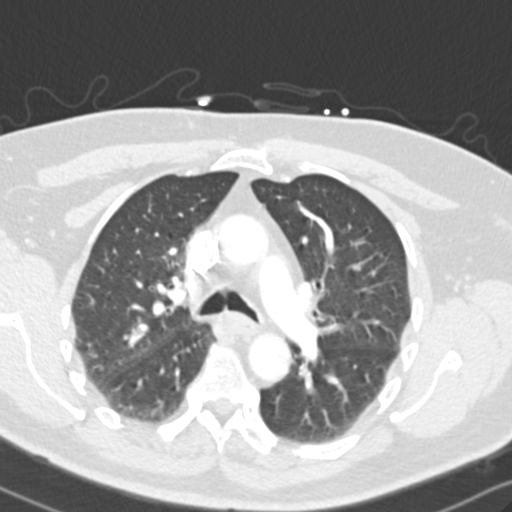
[im 183/275  mediastinal]
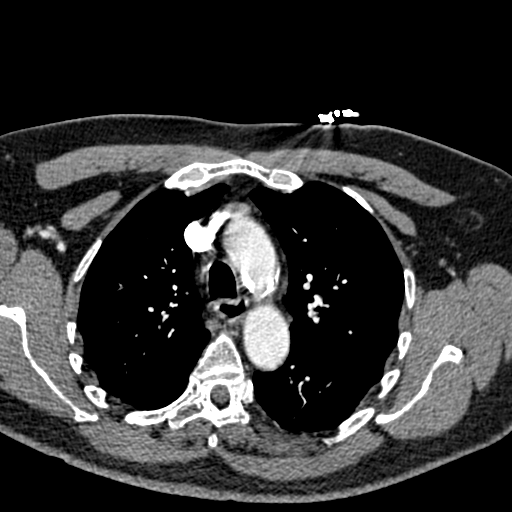
[im 198/275  lung]
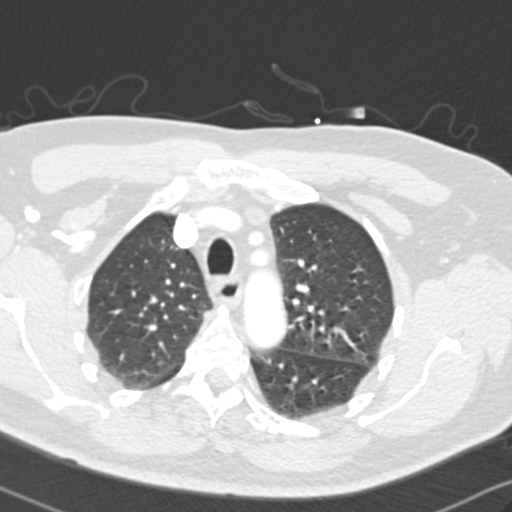
[im 214/275  mediastinal]
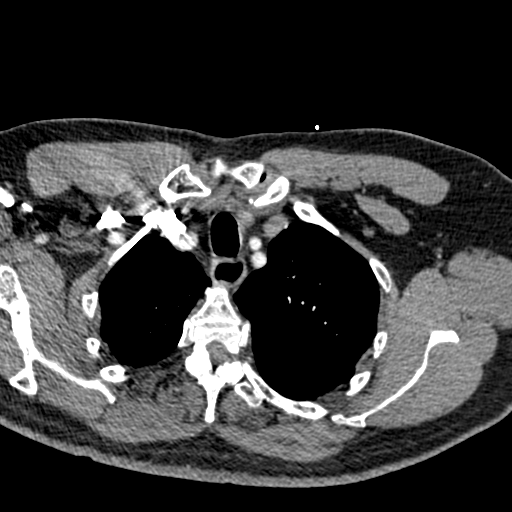
[im 229/275  lung]
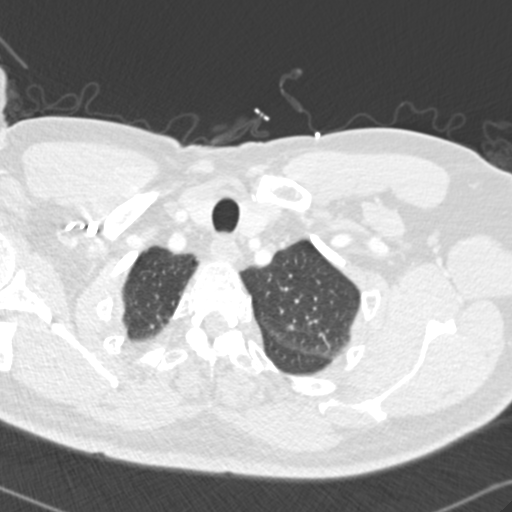
[im 244/275  mediastinal]
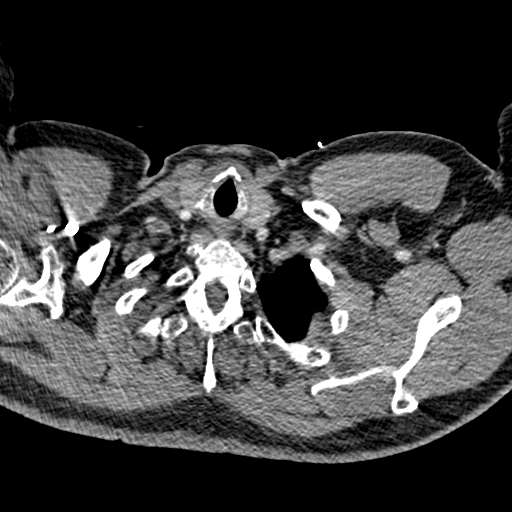
[im 259/275  lung]
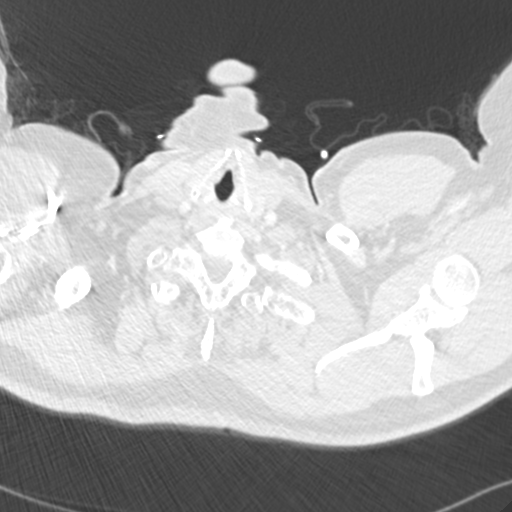

[Series 6: coronal mpr · coronal · 0.54mm/px · 1 of 151 slices shown]
[im 76/151  mediastinal]
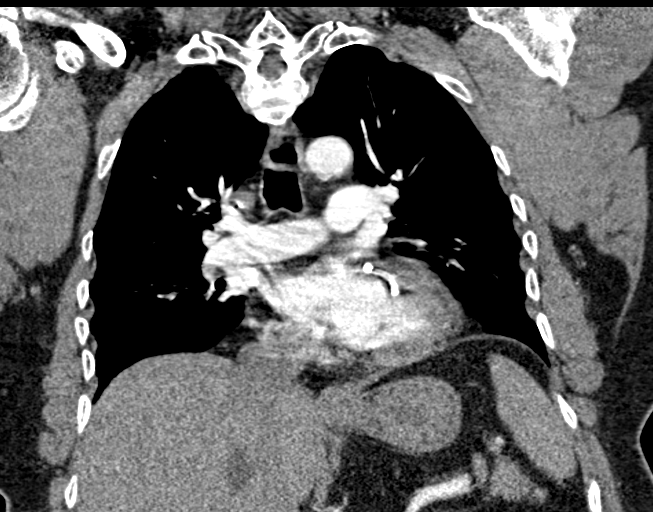

[18 of 36 positions shown; findings below may reference images not displayed]

FINDINGS: Cardiovascular: Satisfactory opacification of the pulmonary arteries
to the segmental level. No evidence of pulmonary embolism. Normal
heart size. Left circumflex coronary artery stenting has been
performed. No pericardial effusion. The thoracic aorta is
age-appropriate

Mediastinum/Nodes: No enlarged mediastinal, hilar, or axillary lymph
nodes. Thyroid gland, trachea, and esophagus demonstrate no
significant findings.

Lungs/Pleura: Lungs are clear. No pleural effusion or pneumothorax.

Upper Abdomen: Multiple low-attenuation lesions are seen within the
visualized liver, the largest of which are compatible with simple
cysts. The visualized upper abdomen is otherwise unremarkable

Musculoskeletal: The osseous structures are age-appropriate.

Review of the MIP images confirms the above findings.
IMPRESSION: No pulmonary embolism. No radiographic explanation for the patient's
reported symptoms.

## 2019-12-29 IMAGING — CR DG CHEST 2V
2 series · 2 of 2 positions shown · non-contrast
Comparison: [DATE]

CLINICAL DATA: Shortness of breath, left rib pain for 3 days

EXAM:
CHEST - 2 VIEW

[w chest pa]
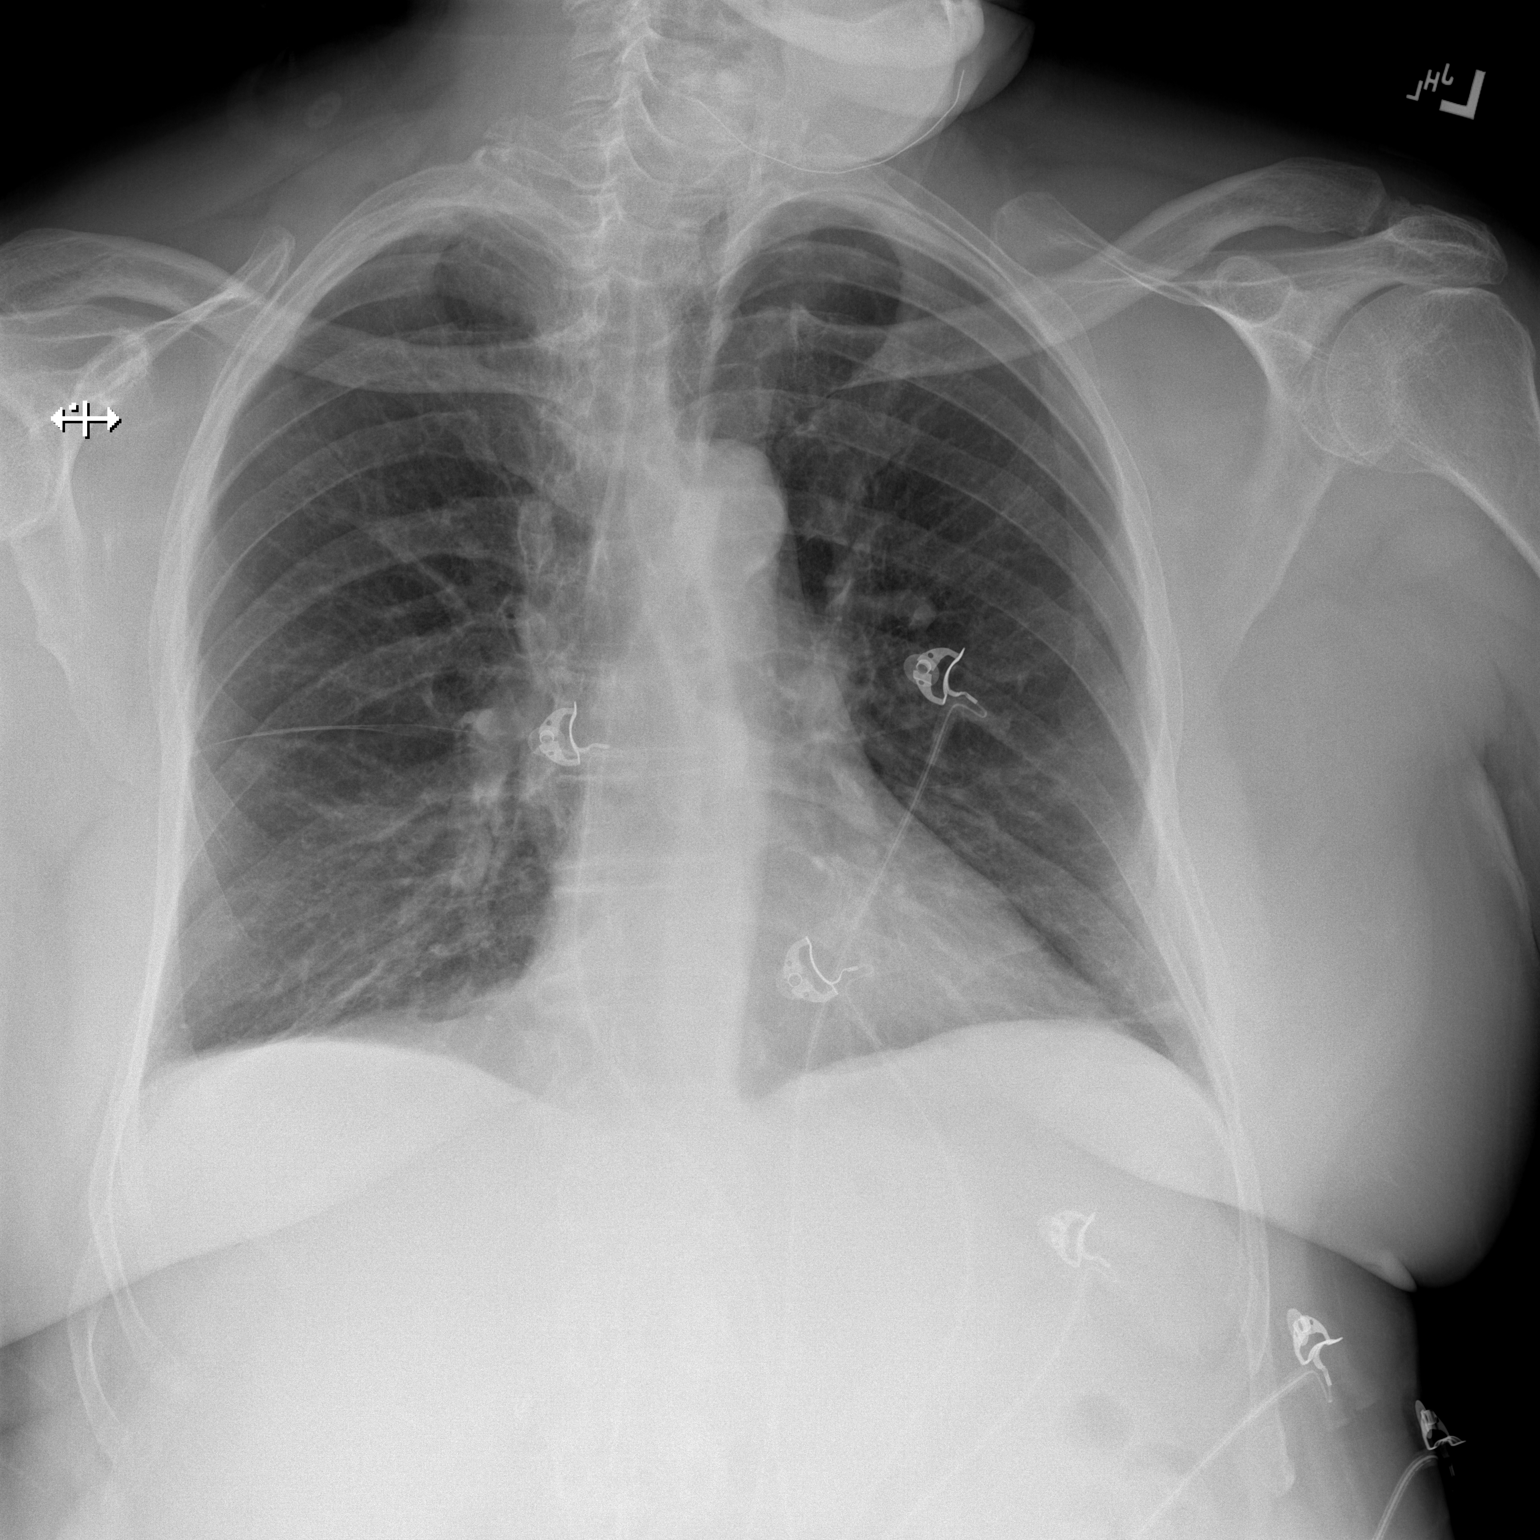

[w chest lat]
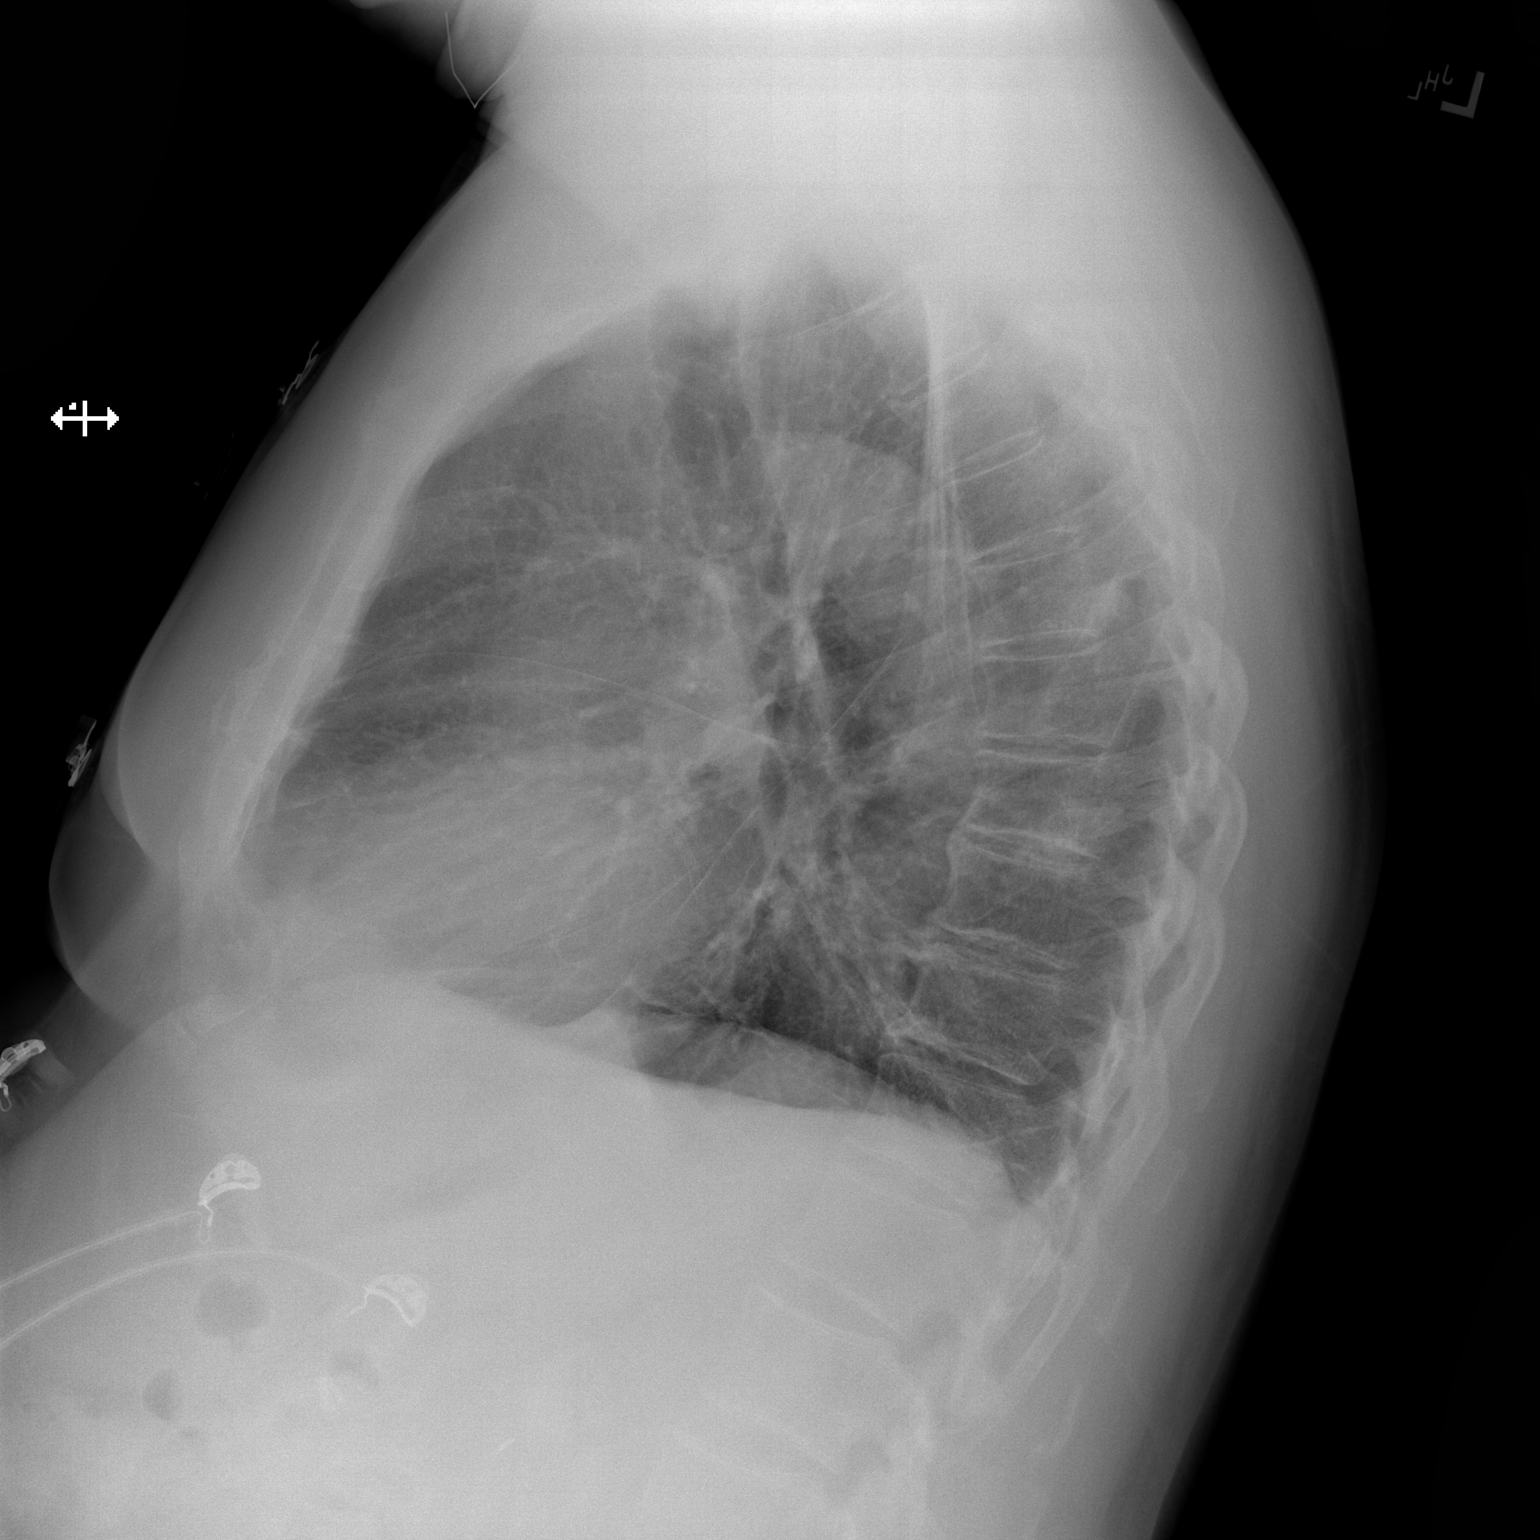

[2 of 2 positions shown; findings below may reference images not displayed]

FINDINGS: Frontal and lateral views of the chest demonstrate an unremarkable
cardiac silhouette. No airspace disease, effusion, or pneumothorax.
No acute bony abnormalities.
IMPRESSION: 1. No acute intrathoracic process.

## 2019-12-29 MED ORDER — AMLODIPINE BESYLATE 5 MG PO TABS
5.0000 mg | ORAL_TABLET | Freq: Once | ORAL | Status: AC
  Administered 2019-12-29: 5 mg via ORAL
  Filled 2019-12-29: qty 1

## 2019-12-29 MED ORDER — SODIUM CHLORIDE (PF) 0.9 % IJ SOLN
INTRAMUSCULAR | Status: AC
  Filled 2019-12-29: qty 50

## 2019-12-29 MED ORDER — IOHEXOL 350 MG/ML SOLN
100.0000 mL | Freq: Once | INTRAVENOUS | Status: AC | PRN
  Administered 2019-12-29: 100 mL via INTRAVENOUS

## 2019-12-29 NOTE — Telephone Encounter (Signed)
Pt c/o of Chest Pain: STAT if CP now or developed within 24 hours  1. Are you having CP right now? yes  2. Are you experiencing any other symptoms (ex. SOB, nausea, vomiting, sweating)? Shortness of breath  How long have you been experiencing CP? 3 days   4. Is your CP continuous or coming and going?constant   5. Have you taken Nitroglycerin? yes ?

## 2019-12-29 NOTE — Telephone Encounter (Signed)
Call sent straight to triage. Patient's wife stated that patient has been having constant chest pain for 3 days and had recent COVID shot. Patient's wife stated she had the COVID shot and ended up with a blood clot and in the hospital. Patient's wife stated patient is having same symptoms. Patient's wife stated patient's chest pain is not relieved by nitro. Informed patient's wife that patient should go to the ED or call 911 to be evaluated. Will send message to Dr. Irish Lack for further advisement.

## 2019-12-29 NOTE — ED Triage Notes (Addendum)
Pt arrives [facility] from home with complaints of SHOB and left rib pain for 3 days, pt reports movement makes the left rib pain worse. Pt reports SHOB when lying flat. Per EMS: pt and sister are concerned that the pt has "blood clots in his lungs due to the COVID shot" 2nd dose of vaccine was 6/30. Pt reports rt shoulder pain for 1 month. Pt reports he took a Nitro at home prior to EMS arrival with no relief of right rib pain. Pt has hx of stroke and is currently taking blood thinners.

## 2019-12-29 NOTE — Discharge Instructions (Signed)
You are seen today for shortness of breath.  Your work-up was very reassuring including heart enzymes and CT angiogram of your chest which showed no signs of any blood clots.  This might be your COPD acting up.  Please follow-up with your primary medical doctor and take all of your medications as prescribed.  We will refer you to orthopedics for your shoulder for further work-up. Thank you for allowing me to care for you today. Please return to the emergency department if you have new or worsening symptoms. Take your medications as instructed.

## 2019-12-29 NOTE — ED Notes (Signed)
Pt able to ambulate without assistance, with a steady gait. Pt reports some dizziness, O2 saturation stayed at 98% while ambulating.

## 2019-12-29 NOTE — ED Provider Notes (Signed)
[facility] DEPT Provider Note   CSN: [id number] Arrival date & time: 12/29/19  1414     History Chief Complaint  Patient presents with  . Shortness of Breath    Chad Moss is a 64 y.o. male.  Patient is a 64 year old male with past medical history of coronary artery disease, COPD, paranoid schizophrenia, prediabetes presenting to the emergency department for shortness of breath.  Patient presents with going on for 3 days.  Reports that when he moves or coughs he has a soreness in the left side of his chest.  This is also reproduced with palpation.  Reports that he has been taking his inhalers at home regularly.  He denies any fever, chills, leg swelling, syncope.  He did get his second Covid vaccine at the end of June and is now concerned about blood clots in the lungs        Past Medical History:  Diagnosis Date  . Allergic rhinitis   . Anxiety   . Atelectasis   . CAD (coronary artery disease), native coronary artery    9/18 PCI/DES x1 to mLCx, mild diffuse nonobstructive disease, EF 55% on Lv gram  . Chronic bronchitis ([place])   . COPD (chronic obstructive pulmonary disease) ([place])   . Crohn's disease ([place])   . Depression   . DVT (deep venous thrombosis) (HCC)    RLE X 2  . Dysphagia   . Dyspnea   . Enlarged prostate   . GERD (gastroesophageal reflux disease)   . Glaucoma, both eyes   . Heart murmur   . History of stomach ulcers 1980s  . Hyperlipidemia   . Hypertension    "off RX for years now cause of coughing w/Lisinopril" (03/13/2017)  . IBS (irritable bowel syndrome)   . Paranoid schizophrenia ([place])   . Peripheral vascular disease ([place])   . Pneumonia 1990s  . Pre-diabetes    "one time" (03/13/2017)  . Stroke [facility]) 04/2016   "eye stroke; left eye" (03/13/2017)  . Syncopal episodes     Patient Active Problem List   Diagnosis Date Noted  . COVID-19 virus detected 11/25/2018  . Diarrhea 11/19/2018  . Fever in adult  11/19/2018  . Inadequate community support 11/19/2018  . Edema 03/09/2018  . Abnormal weight gain 03/09/2018  . Essential hypertension   . Coronary artery disease involving native coronary artery of native heart with angina pectoris ([place]) 03/13/2017  . Coronary artery calcification seen on CAT scan 01/12/2017  . COPD mixed type ([place]) 09/13/2015  . Cough 08/09/2015  . Flu-like symptoms 08/09/2015  . Acute sinusitis 08/09/2015  . AP (abdominal pain) 07/09/2014  . COLD (chronic obstructive lung disease) ([place]) 07/09/2014  . COPD exacerbation ([place]) 06/01/2014  . Other malaise and fatigue 09/06/2013  . Encounter for screening for lung cancer 10/09/2012  . Chronic cough 11/19/2011  . Tobacco abuse 04/06/2011  . Mixed hyperlipidemia 01/27/2009  . HYPERTENSION 01/27/2009  . PERIPHERAL VASCULAR DISEASE 01/27/2009  . Allergic rhinitis 01/27/2009  . Other emphysema ([place]) 01/27/2009  . DIZZINESS, CHRONIC 01/27/2009  . SHORTNESS OF BREATH (SOB) 01/27/2009  . Crohn's disease without complication ([place]) 84/66/5993    Past Surgical History:  Procedure Laterality Date  . ABDOMINAL HERNIA REPAIR  1990s  . COLECTOMY  1985; ?1989; 1996   "part of my ileum removed"  . CORONARY ANGIOPLASTY WITH STENT PLACEMENT  03/13/2017  . CORONARY STENT INTERVENTION N/A 03/13/2017   Procedure: CORONARY STENT INTERVENTION;  Surgeon: Jettie Booze, MD;  Location: [facility];  Service: Cardiovascular;  Laterality: N/A;  . EYE SURGERY Right    "laser; for glaucoma" (03/13/2017)  . FASCIOTOMY Right   . HERNIA REPAIR    . LAPAROSCOPIC CHOLECYSTECTOMY    . LEFT HEART CATH AND CORONARY ANGIOGRAPHY N/A 03/13/2017   Procedure: LEFT HEART CATH AND CORONARY ANGIOGRAPHY;  Surgeon: Jettie Booze, MD;  Location: [facility];  Service: Cardiovascular;  Laterality: N/A;  . PENILE PROSTHESIS IMPLANT  01/2011   Archie Endo 02/01/2011  . PERIPHERAL VASCULAR CATHETERIZATION Right 1999   "had blood clots in my  leg; went in to open them up; dye went into leg; ended up having a fasiotomy"  . TONSILLECTOMY         Family History  Problem Relation Age of Onset  . Hypertension Mother   . Heart disease Mother   . Hypertension Father   . Heart disease Father   . Lung cancer Brother        2006    Social History   Tobacco Use  . Smoking status: Current Every Day Smoker    Packs/day: 1.00    Years: 50.00    Pack years: 50.00    Types: Cigarettes    Start date: 11/18/1968  . Smokeless tobacco: Never Used  . Tobacco comment: currently smoking 1ppd  Vaping Use  . Vaping Use: Never used  Substance Use Topics  . Alcohol use: No    Alcohol/week: 0.0 standard drinks  . Drug use: No    Home Medications Prior to Admission medications   Medication Sig Start Date End Date Taking? Authorizing Provider  albuterol (PROAIR HFA) 108 (90 Base) MCG/ACT inhaler INHALE 2 PUFFS BY MOUTH INTO THE LUNGS EVERY 6 HOURS AS NEEDED FOR WHEEZING OR SHORTNESS OF BREATH 12/23/18   Brand Males, MD  albuterol (PROVENTIL) (2.5 MG/3ML) 0.083% nebulizer solution Take 3 mLs (2.5 mg total) by nebulization every 6 (six) hours as needed for wheezing or shortness of breath. 07/18/18   Brand Males, MD  alfuzosin (UROXATRAL) 10 MG 24 hr tablet Take 10 mg by mouth at bedtime.     [provider]  amLODipine (NORVASC) 5 MG tablet TAKE 1 TABLET(5 MG) BY MOUTH DAILY 11/22/18   Jettie Booze, MD  aspirin EC 81 MG tablet Take 81 mg by mouth daily.    [provider]  bimatoprost (LUMIGAN) 0.01 % SOLN Place 1 drop into the left eye at bedtime.    [provider]  brimonidine (ALPHAGAN) 0.15 % ophthalmic solution Place 1 drop into the left eye 2 (two) times daily.     [provider]  cholestyramine Lucrezia Starch) 4 G packet Take 1 packet by mouth daily.     [provider]  clonazePAM (KLONOPIN) 1 MG tablet Take 1 mg by mouth at bedtime. Anxiety    [provider]    clopidogrel (PLAVIX) 75 MG tablet Take 1 tablet (75 mg total) by mouth daily. 11/20/19   Jettie Booze, MD  diazepam (VALIUM) 2 MG tablet Take 1 tablet (2 mg total) by mouth 2 (two) times daily as needed for anxiety. 09/06/19   Couture, Cortni S, PA-C  dicyclomine (BENTYL) 10 MG capsule Take 10 mg by mouth 4 (four) times daily -  before meals and at bedtime.     [provider]  diphenhydrAMINE (BENADRYL) 25 MG tablet Take 50 mg by mouth at bedtime.     [provider]  finasteride (PROSCAR) 5 MG  tablet Take 5 mg by mouth daily.    [provider]  fluticasone (FLONASE) 50 MCG/ACT nasal spray SHAKE LIQUID AND USE 1 SPRAY IN [facility] NOSTRIL DAILY 10/27/19   Martyn Ehrich, NP  furosemide (LASIX) 40 MG tablet Take 1 tablet 4 times a week 09/09/19   Jettie Booze, MD  isosorbide mononitrate (IMDUR) 30 MG 24 hr tablet TAKE 1 TABLET(30 MG) BY MOUTH DAILY 12/01/19   Jettie Booze, MD  loratadine (CLARITIN) 10 MG tablet Take 1 tablet (10 mg total) by mouth daily. 09/03/19 09/02/20  [name], FNP  loxapine (LOXITANE) 10 MG capsule Take 10 mg by mouth 2 (two) times daily. 1 tab in AM and 2 tabs in PM    [provider]  mercaptopurine (PURINETHOL) 50 MG tablet Take 75 mg by mouth daily. 1 tablet and a half tablet Give on an empty stomach 1 hour before or 2 hours after meals. Caution: Chemotherapy.    [provider]  mesalamine (PENTASA) 250 MG CR capsule Take 1,000 mg by mouth 4 (four) times daily.     [provider]  mirtazapine (REMERON) 30 MG tablet Take 45 mg by mouth at bedtime. 1.5 tablets by mouth daily 01/08/17   [provider]  nitroGLYCERIN (NITROSTAT) 0.4 MG SL tablet Place 1 tablet (0.4 mg total) under the tongue every 5 (five) minutes x 3 doses as needed for chest pain. 11/06/19   Jettie Booze, MD  pantoprazole (PROTONIX) 40 MG tablet TAKE 1 TABLET(40 MG) BY MOUTH TWICE DAILY 12/09/19   Jettie Booze, MD  phenol (CHLORASEPTIC GARGLE) 1.4 % LIQD Use as directed 1 spray in the mouth or throat as needed for throat irritation / pain. 09/06/19   Couture, Cortni S, PA-C  potassium chloride (K-DUR) 10 MEQ tablet Take 1 tablet (10 mEq total) by mouth 2 (two) times daily. 08/01/17   Jettie Booze, MD  rosuvastatin (CRESTOR) 20 MG tablet TAKE 1 TABLET(20 MG) BY MOUTH DAILY 11/20/19   Jettie Booze, MD  SPIRIVA HANDIHALER 18 MCG inhalation capsule INHALE THE CONTENT OF 1 CAPSULE VIA HANDIHALER DAILY 07/28/19   Brand Males, MD  timolol (TIMOPTIC) 0.5 % ophthalmic solution Place 1 drop into the left eye 2 (two) times daily. 07/21/16   [provider]  traZODone (DESYREL) 50 MG tablet Take 150 mg by mouth at bedtime.    [provider]    Allergies    Benztropine, Codeine, Meperidine, Cyclobenzaprine, Penicillins, Sulfamethoxazole-trimethoprim, and Sulfonamide derivatives  Review of Systems   Review of Systems  Constitutional: Negative for chills and fever.  HENT: Negative.   Respiratory: Positive for cough and shortness of breath. Negative for chest tightness and wheezing.   Cardiovascular: Negative for chest pain.  Gastrointestinal: Negative for abdominal pain, nausea and vomiting.  Genitourinary: Negative.   Musculoskeletal: Negative for arthralgias and myalgias.  All other systems reviewed and are negative.   Physical Exam Updated Vital Signs BP (!) 160/94   Pulse (!) 107   Temp 98.2 F (36.8 C) (Oral)   Resp 15   Ht 5' 11"  (1.803 m)   Wt 108.9 kg   SpO2 97%   BMI 33.47 kg/m   Physical Exam Vitals and nursing note reviewed.  Constitutional:      General: He is not in acute distress.    Appearance: Normal appearance. He is well-developed. He is not ill-appearing, toxic-appearing or diaphoretic.  HENT:     Head: Normocephalic.  Eyes:  Conjunctiva/sclera: Conjunctivae normal.     Pupils: Pupils are equal, round, and reactive to light.   Cardiovascular:     Rate and Rhythm: Normal rate and regular rhythm.  Pulmonary:     Effort: Pulmonary effort is normal. No tachypnea or bradypnea.     Breath sounds: Normal breath sounds.  Chest:     Chest wall: Tenderness (left lower chest) present.  Musculoskeletal:     Right lower leg: No edema.     Left lower leg: No edema.  Skin:    General: Skin is dry.  Neurological:     Mental Status: He is alert.  Psychiatric:        Mood and Affect: Mood normal.     ED Results / Procedures / Treatments   Labs (all labs ordered are listed, but only abnormal results are displayed) Labs Reviewed  D-DIMER, QUANTITATIVE (NOT AT [facility]) - Abnormal; Notable for the following components:      Result Value   D-Dimer, Quant 0.75 (*)    All other components within normal limits  CBC - Abnormal; Notable for the following components:   RBC 4.13 (*)    HCT 37.9 (*)    All other components within normal limits  BASIC METABOLIC PANEL  BRAIN NATRIURETIC PEPTIDE  TROPONIN I (HIGH SENSITIVITY)  TROPONIN I (HIGH SENSITIVITY)    EKG None  Radiology DG Chest 2 View  Result Date: 12/29/2019 CLINICAL DATA:  Shortness of breath, left rib pain for 3 days EXAM: CHEST - 2 VIEW COMPARISON:  11/19/2018 FINDINGS: Frontal and lateral views of the chest demonstrate an unremarkable cardiac silhouette. No airspace disease, effusion, or pneumothorax. No acute bony abnormalities. IMPRESSION: 1. No acute intrathoracic process. Electronically Signed   By: Randa Ngo M.D.   On: 12/29/2019 15:38   CT Angio Chest PE W and/or Wo Contrast  Result Date: 12/29/2019 CLINICAL DATA:  Dyspnea, left rib pain, positive D-dimer. Prior history of stroke on chronic anticoagulation. EXAM: CT ANGIOGRAPHY CHEST WITH CONTRAST TECHNIQUE: Multidetector CT imaging of the chest was performed using the standard protocol during bolus administration of intravenous contrast. Multiplanar CT image reconstructions and MIPs were obtained to  evaluate the vascular anatomy. CONTRAST:  119m OMNIPAQUE IOHEXOL 350 MG/ML SOLN COMPARISON:  None. FINDINGS: Cardiovascular: Satisfactory opacification of the pulmonary arteries to the segmental level. No evidence of pulmonary embolism. Normal heart size. Left circumflex coronary artery stenting has been performed. No pericardial effusion. The thoracic aorta is age-appropriate Mediastinum/Nodes: No enlarged mediastinal, hilar, or axillary lymph nodes. Thyroid gland, trachea, and esophagus demonstrate no significant findings. Lungs/Pleura: Lungs are clear. No pleural effusion or pneumothorax. Upper Abdomen: Multiple low-attenuation lesions are seen within the visualized liver, the largest of which are compatible with simple cysts. The visualized upper abdomen is otherwise unremarkable Musculoskeletal: The osseous structures are age-appropriate. Review of the MIP images confirms the above findings. IMPRESSION: No pulmonary embolism. No radiographic explanation for the patient's reported symptoms. Electronically Signed   By: AFidela SalisburyMD   On: 12/29/2019 17:55    Procedures Procedures (including critical care time)  Medications Ordered in ED Medications  sodium chloride (PF) 0.9 % injection (has no administration in time range)  amLODipine (NORVASC) tablet 5 mg (5 mg Oral Given 12/29/19 1716)  iohexol (OMNIPAQUE) 350 MG/ML injection 100 mL (100 mLs Intravenous Contrast Given 12/29/19 1733)    ED Course  I have reviewed the triage vital signs and the nursing notes.  Pertinent labs & imaging results that were available  during my care of the patient were reviewed by me and considered in my medical decision making (see chart for details).  Clinical Course as of Dec 28 1848  Mon Dec 29, 2019  1644 64 y/o male presenting with SOB for three days, recently had second covid vaccine. Also has reproducible L rib pain. Patient mildly tachy on exam with elevated BP but normal oxygen sats and overall appears  well. Workup revealed normal BNP and trop but elevated ddimer to .75 so we proceeded with CTA. Chest xray clear.    [KM]  1847 Patient work-up reassuring.  CTA negative.  Patient reports that he is doing well.  Unsure etiology of patient's shortness of breath.  Possibly due to his chronic COPD.  Advised medical health.  He also advised has been having for several months.  Seems musculoskeletal.  Refer to primary care and ortho   [KM]    Clinical Course User Index [KM] Kristine Royal   MDM Rules/Calculators/A&P                          Based on review of vitals, medical screening exam, lab work and/or imaging, there does not appear to be an acute, emergent etiology for the patient's symptoms. Counseled pt on good return precautions and encouraged both PCP and ED follow-up as needed.  Prior to discharge, I also discussed incidental imaging findings with patient in detail and advised appropriate, recommended follow-up in detail.  Clinical Impression: 1. SOB (shortness of breath)   2. Right shoulder pain, unspecified chronicity     Disposition: Discharge  Prior to providing a prescription for a controlled substance, I independently reviewed the patient's recent prescription history on the [place]. The patient had no recent or regular prescriptions and was deemed appropriate for a brief, less than 3 day prescription of narcotic for acute analgesia.  This note was prepared with assistance of Systems analyst. Occasional wrong-word or sound-a-like substitutions may have occurred due to the inherent limitations of voice recognition software.  Final Clinical Impression(s) / ED Diagnoses Final diagnoses:  SOB (shortness of breath)  Right shoulder pain, unspecified chronicity    Rx / DC Orders ED Discharge Orders    None       Kristine Royal 12/29/19 [place], MD 12/30/19 1357

## 2019-12-31 ENCOUNTER — Ambulatory Visit: Payer: Self-pay

## 2019-12-31 NOTE — Chronic Care Management (AMB) (Signed)
  Care Management    Consult Note  12/31/2019 Name: Chad Moss MRN: [id number] DOB: [birth year]  Care management team received notification of patient's recent emergency department visit related to shortness of breath .Based on review of health record, Chad Moss is not currently enrolled in the CCM program but is eligible for CCM services based on qualifying diagnoses and visit with PCP in the last 12 months.    Plan: SW to route note to PCP to make aware of patient eligibility. If PCP feels patient would benefit from embedded care management, please sen a referral to the team.  Bevelyn Ngo, BSW, CDP Social Worker, Certified Dementia Practitioner [facility] Care Management [phone]

## 2020-01-02 NOTE — Telephone Encounter (Signed)
Agree with getting checked in ER for chest pain.

## 2020-01-05 ENCOUNTER — Other Ambulatory Visit: Payer: Self-pay

## 2020-01-05 ENCOUNTER — Encounter: Payer: Self-pay | Admitting: Acute Care

## 2020-01-05 ENCOUNTER — Ambulatory Visit (INDEPENDENT_AMBULATORY_CARE_PROVIDER_SITE_OTHER): Payer: Medicare Other | Admitting: Acute Care

## 2020-01-05 ENCOUNTER — Ambulatory Visit: Payer: Medicare Other

## 2020-01-05 VITALS — BP 140/80 | HR 96 | Temp 97.5°F | Ht 71.0 in | Wt 243.0 lb

## 2020-01-05 DIAGNOSIS — Z720 Tobacco use: Secondary | ICD-10-CM

## 2020-01-05 DIAGNOSIS — Z122 Encounter for screening for malignant neoplasm of respiratory organs: Secondary | ICD-10-CM

## 2020-01-05 DIAGNOSIS — F17210 Nicotine dependence, cigarettes, uncomplicated: Secondary | ICD-10-CM

## 2020-01-05 NOTE — Patient Instructions (Signed)
Thank you for participating in the [place] [facility] Cancer Screening Program. It was our pleasure to meet you today. We will call you with the results of your scan within the next few days. Your scan will be assigned a Lung RADS category score by the physicians reading the scans.  This Lung RADS score determines follow up scanning.  See below for description of categories, and follow up screening recommendations. We will be in touch to schedule your follow up screening annually or based on recommendations of our providers. We will fax a copy of your scan results to your Primary Care Physician, or the physician who referred you to the program, to ensure they have the results. Please call the office if you have any questions or concerns regarding your scanning experience or results.  Our office number is [phone]. Please speak with Denise Phelps, RN. She is our Lung Cancer Screening RN. If she is unavailable when you call, please have the office staff send her a message. She will return your call at her earliest convenience. Remember, if your scan is normal, we will scan you annually as long as you continue to meet the criteria for the program. (Age 55-77, Current smoker or smoker who has quit within the last 15 years). If you are a smoker, remember, quitting is the single most powerful action that you can take to decrease your risk of lung cancer and other pulmonary, breathing related problems. We know quitting is hard, and we are here to help.  Please let us know if there is anything we can do to help you meet your goal of quitting. If you are a former smoker, congratulations. We are proud of you! Remain smoke free! Remember you can refer friends or family members through the number above.  We will screen them to make sure they meet criteria for the program. Thank you for helping us take better care of you by participating in Lung Screening.  Lung RADS Categories:  Lung RADS 1: no nodules  or definitely non-concerning nodules.  Recommendation is for a repeat annual scan in 12 months.  Lung RADS 2:  nodules that are non-concerning in appearance and behavior with a very low likelihood of becoming an active cancer. Recommendation is for a repeat annual scan in 12 months.  Lung RADS 3: nodules that are probably non-concerning , includes nodules with a low likelihood of becoming an active cancer.  Recommendation is for a 6-month repeat screening scan. Often noted after an upper respiratory illness. We will be in touch to make sure you have no questions, and to schedule your 6-month scan.  Lung RADS 4 A: nodules with concerning findings, recommendation is most often for a follow up scan in 3 months or additional testing based on our provider's assessment of the scan. We will be in touch to make sure you have no questions and to schedule the recommended 3 month follow up scan.  Lung RADS 4 B:  indicates findings that are concerning. We will be in touch with you to schedule additional diagnostic testing based on our provider's  assessment of the scan.   

## 2020-01-05 NOTE — Progress Notes (Signed)
Shared Decision Making Visit Lung Cancer Screening Program [phone])   Eligibility:  Age 64 y.o.  Pack Years Smoking History Calculation 100 pack year smoking history (# packs/per year x # years smoked)  Recent History of coughing up blood  no  Unexplained weight loss? no ( >Than 15 pounds within the last 6 months )  Prior History Lung / other cancer no (Diagnosis within the last 5 years already requiring surveillance chest CT Scans).  Smoking Status Current Smoker  Former Smokers: Years since quit: NA  Quit Date: NA  Visit Components:  Discussion included one or more decision making aids. yes  Discussion included risk/benefits of screening. yes  Discussion included potential follow up diagnostic testing for abnormal scans. yes  Discussion included meaning and risk of over diagnosis. yes  Discussion included meaning and risk of False Positives. yes  Discussion included meaning of total radiation exposure. yes  Counseling Included:  Importance of adherence to annual lung cancer LDCT screening. yes  Impact of comorbidities on ability to participate in the program. yes  Ability and willingness to under diagnostic treatment. yes  Smoking Cessation Counseling:  Current Smokers:   Discussed importance of smoking cessation. yes  Information about tobacco cessation classes and interventions provided to patient. yes  Patient provided with "ticket" for LDCT Scan. yes  Symptomatic Patient. no  Counseling  Diagnosis Code: Tobacco Use Z72.0  Asymptomatic Patient yes  Counseling (Intermediate counseling: > three minutes counseling) [id number]  Former Smokers:   Discussed the importance of maintaining cigarette abstinence. yes  Diagnosis Code: Personal History of Nicotine Dependence. N00.370  Information about tobacco cessation classes and interventions provided to patient. Yes  Patient provided with "ticket" for LDCT Scan. yes  Written Order for Lung Cancer  Screening with LDCT placed in Epic. Yes (CT Chest Lung Cancer Screening Low Dose W/O CM) [id number] Z12.2-Screening of respiratory organs Z87.891-Personal history of nicotine dependence  Pt would like to meet with pharmacy to discuss smoking cessation.   I have spent 25 minutes of face to face time with Mr. Reppond discussing the risks and benefits of lung cancer screening. We viewed a power point together that explained in detail the above noted topics. We paused at intervals to allow for questions to be asked and answered to ensure understanding.We discussed that the single most powerful action that he can take to decrease his risk of developing lung cancer is to quit smoking. We discussed whether or not he is ready to commit to setting a quit date. We discussed options for tools to aid in quitting smoking including nicotine replacement therapy, non-nicotine medications, support groups, Quit Smart classes, and behavior modification. We discussed that often times setting smaller, more achievable goals, such as eliminating 1 cigarette a day for a week and then 2 cigarettes a day for a week can be helpful in slowly decreasing the number of cigarettes smoked. This allows for a sense of accomplishment as well as providing a clinical benefit. I gave him the " Be Stronger Than Your Excuses" card with contact information for community resources, classes, free nicotine replacement therapy, and access to mobile apps, text messaging, and on-line smoking cessation help. I have also given him my card and contact information in the event he needs to contact me. We discussed the time and location of the scan, and that either Doroteo Glassman RN or I will call with the results within 24-48 hours of receiving them. I have offered him   a copy of the power  point we viewed  as a resource in the event they need reinforcement of the concepts we discussed today in the office. The patient verbalized understanding of all of  the above  and had no further questions upon leaving the office. They have my contact information in the event they have any further questions.  I spent 4 minutes counseling on smoking cessation and the health risks of continued tobacco abuse.  I explained to the patient that there has been a high incidence of coronary artery disease noted on these exams. I explained that this is a non-gated exam therefore degree or severity cannot be determined. This patient is on statin therapy. I have asked the patient to follow-up with their PCP regarding any incidental finding of coronary artery disease and management with diet or medication as their PCP  feels is clinically indicated. The patient verbalized understanding of the above and had no further questions upon completion of the visit.   The patient will be scheduled to meet with a pharmacist for smoking cessation counseling.    Magdalen Spatz, NP 01/05/2020 5:02 PM

## 2020-01-05 NOTE — Telephone Encounter (Signed)
Called and spoke to [place], patient's caregiver. Patient was evaluated in the ER. She denies any additional needs at this time.

## 2020-01-06 ENCOUNTER — Telehealth: Payer: Self-pay | Admitting: Pharmacy Technician

## 2020-01-06 ENCOUNTER — Inpatient Hospital Stay (HOSPITAL_COMMUNITY): Admission: RE | Admit: 2020-01-06 | Payer: Medicare Other | Source: Ambulatory Visit

## 2020-01-06 NOTE — Telephone Encounter (Signed)
Pt's appt has been rescheduled for 7/22. Nothing further needed.

## 2020-01-06 NOTE — Telephone Encounter (Signed)
Received call from [facility] clinic that patient was mistakenly rescheduled with them for a smoking cessation appointment for today at [place] patient to reschedule, no answer left voicemail to call back to reschedule appointment.  Please reschedule patient for an open [facility] Pharmacist slot.  8:35 AM Beatriz Chancellor, CPhT

## 2020-01-07 ENCOUNTER — Other Ambulatory Visit: Payer: Self-pay | Admitting: Internal Medicine

## 2020-01-07 ENCOUNTER — Ambulatory Visit
Admission: RE | Admit: 2020-01-07 | Discharge: 2020-01-07 | Disposition: A | Discharge status: Home / Self Care | Payer: Medicare Other | Source: Ambulatory Visit | Attending: Acute Care | Admitting: Acute Care

## 2020-01-07 DIAGNOSIS — F17210 Nicotine dependence, cigarettes, uncomplicated: Secondary | ICD-10-CM | POA: Diagnosis not present

## 2020-01-07 DIAGNOSIS — Z87891 Personal history of nicotine dependence: Secondary | ICD-10-CM

## 2020-01-07 IMAGING — CT CT CHEST LUNG CANCER SCREENING LOW DOSE W/O CM
2 of 5 series · 14 of 40 positions shown, 17 images · non-contrast
Comparison: [DATE]

CLINICAL DATA: Lung cancer screening. One hundred pack-year
history. Current asymptomatic smoker.

EXAM:
CT CHEST WITHOUT CONTRAST LOW-DOSE FOR LUNG CANCER SCREENING
TECHNIQUE: Multidetector CT imaging of the chest was performed following the
standard protocol without IV contrast.

[Series 4: lung 1.00 br44 cor · coronal · 0.52mm/px · 3 of 349 slices shown]
[im 70/349  lung]
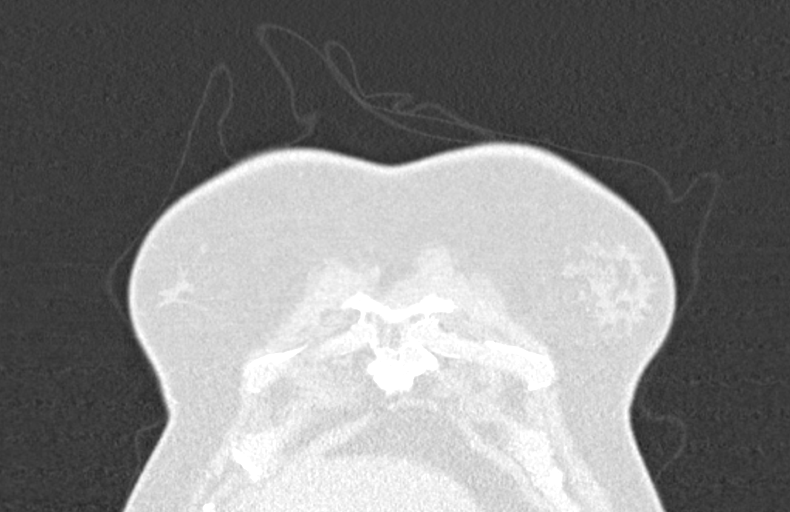
[im 140/349  lung]
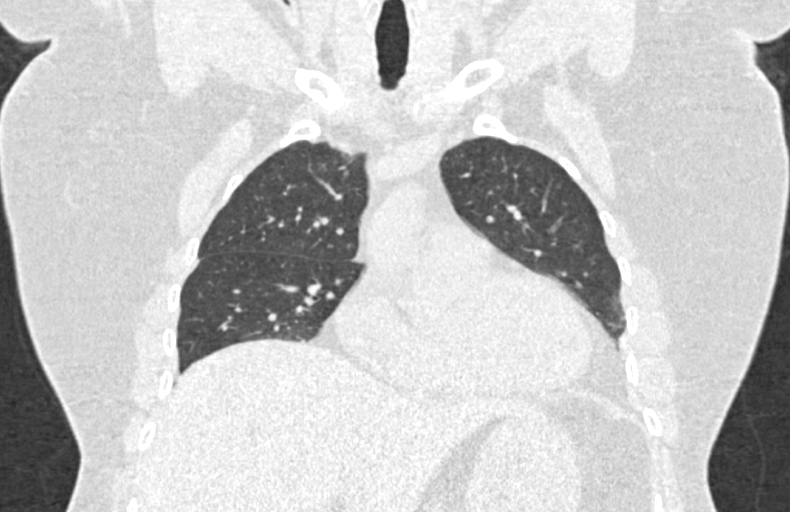
[im 209/349  lung]
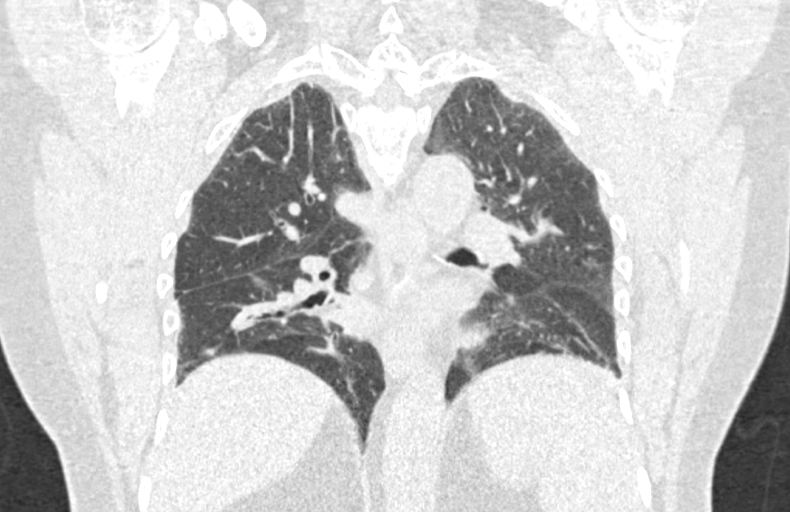

[Series 9: lung 1.00 br60 axial · axial · 0.81mm/px · z∈[-1150,-909]mm · 11 of 267 slices shown, 14 images]
[im 13/267  mediastinal]
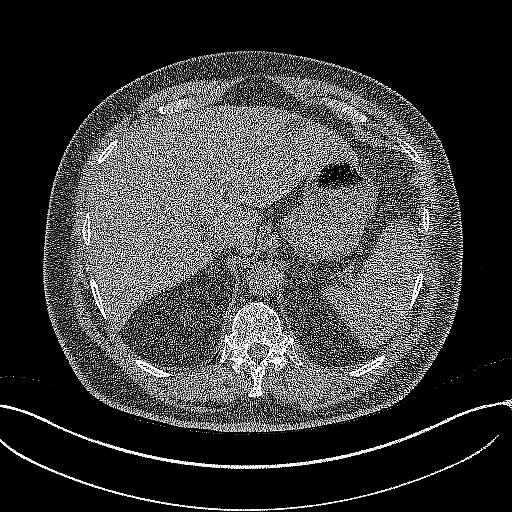
[im 13/267  lung]
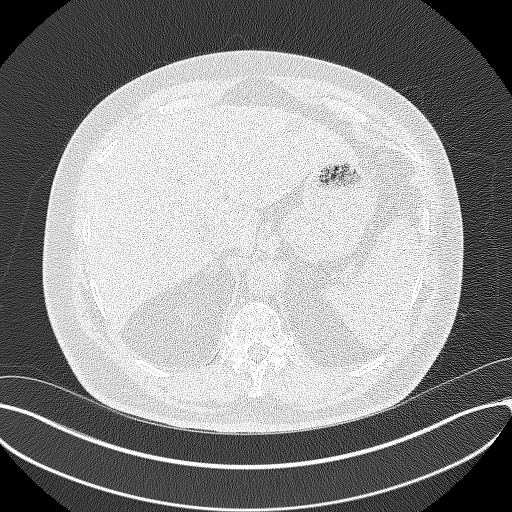
[im 39/267  lung]
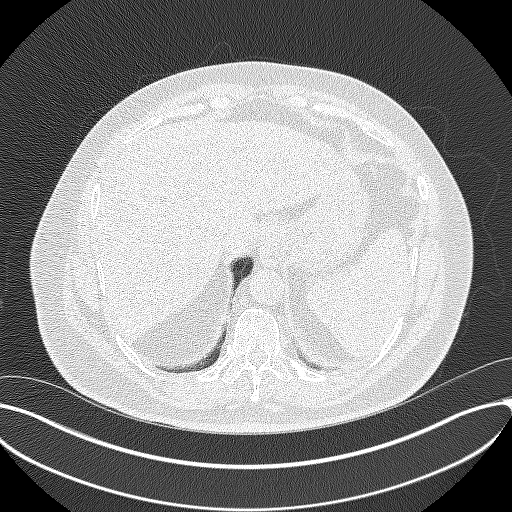
[im 64/267  lung]
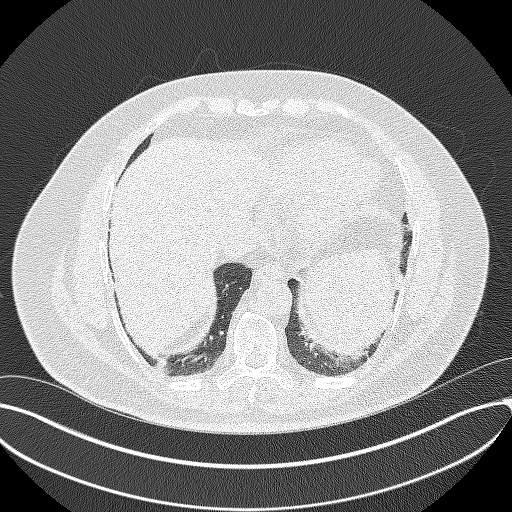
[im 89/267  lung]
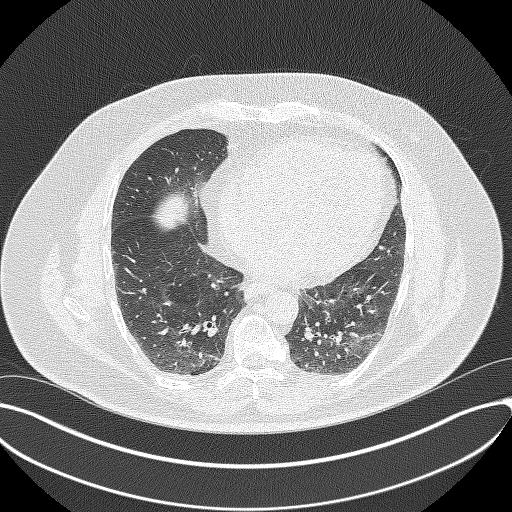
[im 115/267  mediastinal]
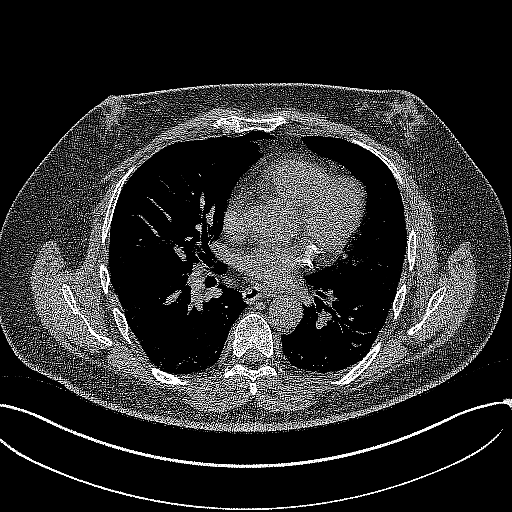
[im 115/267  lung]
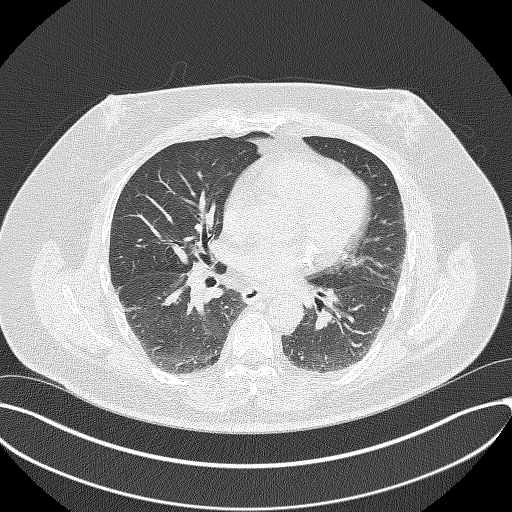
[im 140/267  lung]
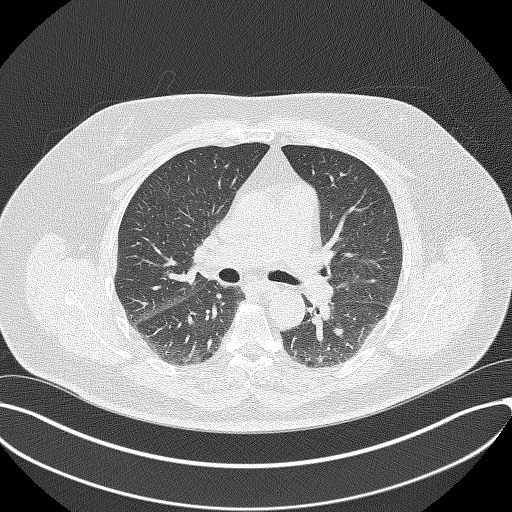
[im 153/267  lung]
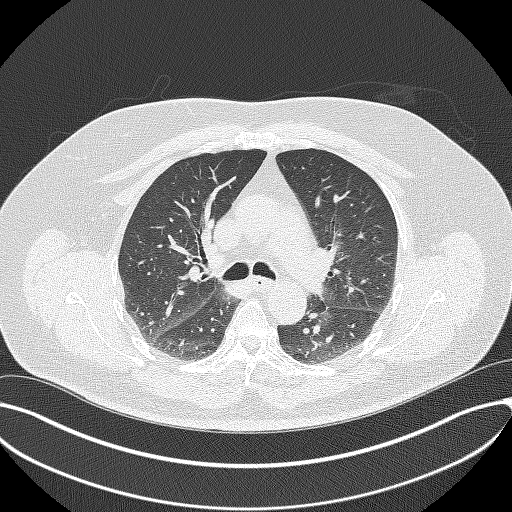
[im 178/267  lung]
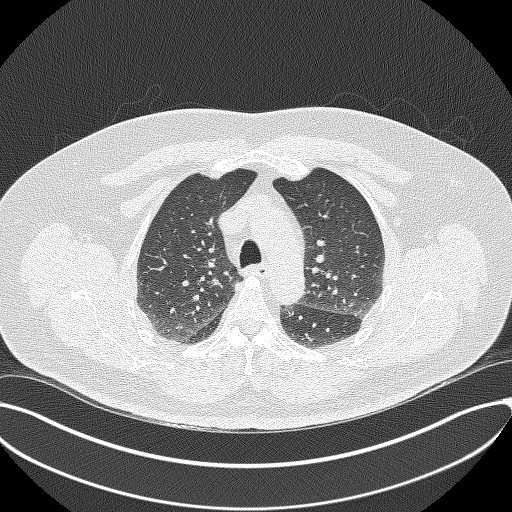
[im 203/267  mediastinal]
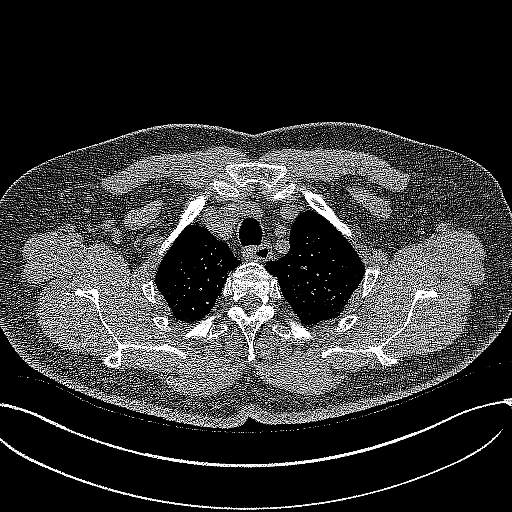
[im 203/267  lung]
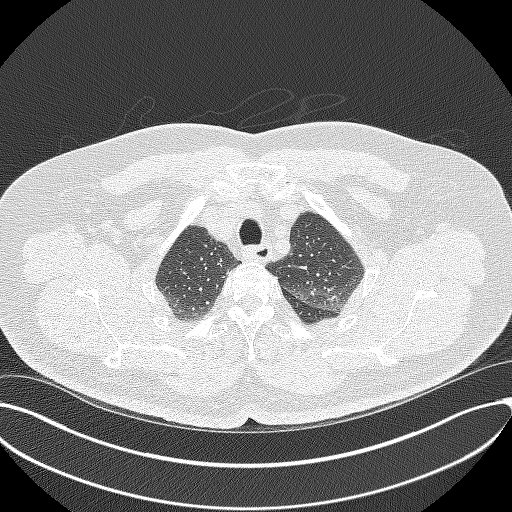
[im 229/267  lung]
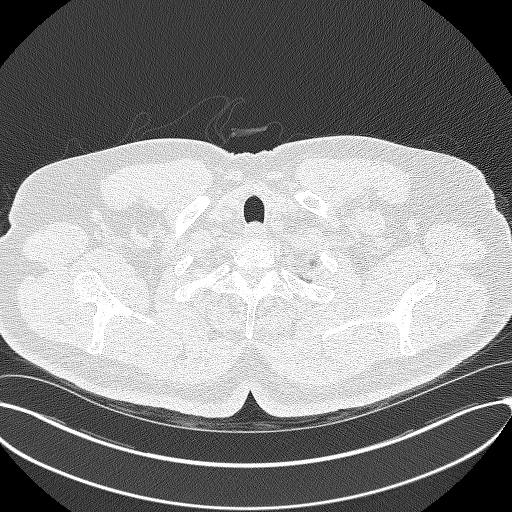
[im 254/267  lung]
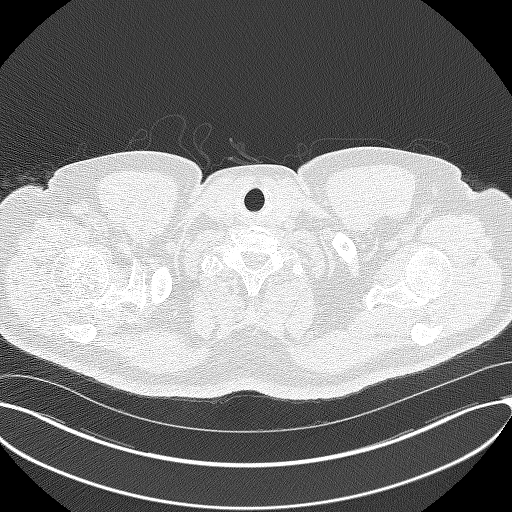

[14 of 40 positions shown; findings below may reference images not displayed]

FINDINGS: Cardiovascular: Normal heart size. No pericardial effusion. Aortic
atherosclerosis. Left circumflex and RCA coronary artery
calcifications.

Mediastinum/Nodes: No enlarged mediastinal, hilar, or axillary lymph
nodes. Thyroid gland, trachea, and esophagus demonstrate no
significant findings.

Lungs/Pleura: No pleural effusion, airspace consolidation, or
atelectasis. Scarring noted within the lung bases. Multiple
pulmonary nodules are again identified involving both lungs. The
largest nodule is in the right middle lobe with an equivalent
diameter 4.4 mm.

Upper Abdomen: Scattered low-attenuation foci are again identified
within the liver, most likely representing simple cysts. These are
unchanged when compared with [DATE]. No acute abnormality
identified within the imaged portions of the upper abdomen.

Musculoskeletal: No chest wall mass or suspicious bone lesions
identified. Degenerative disc disease identified within the thoracic
spine. No acute or suspicious osseous findings.
IMPRESSION: 1. Lung-RADS 2, benign appearance or behavior. Continue annual
screening with low-dose chest CT without contrast in 12 months.
2. Coronary artery calcifications.
3. Aortic atherosclerosis.

Aortic Atherosclerosis ([IN]-[IN]).

## 2020-01-08 ENCOUNTER — Other Ambulatory Visit: Payer: Self-pay

## 2020-01-08 ENCOUNTER — Other Ambulatory Visit: Payer: Medicare Other

## 2020-01-08 NOTE — Progress Notes (Signed)
Please call patient and let them  know their  low dose Ct was read as a Lung RADS 2: nodules that are benign in appearance and behavior with a very low likelihood of becoming a clinically active cancer due to size or lack of growth. Recommendation per radiology is for a repeat LDCT in 12 months. .Please let them  know we will order and schedule their  annual screening scan for 12/2020. Please let them  know there was notation of CAD on their  scan.  Please remind the patient  that this is a non-gated exam therefore degree or severity of disease  cannot be determined. Please have them  follow up with their PCP regarding potential risk factor modification, dietary therapy or pharmacologic therapy if clinically indicated. Pt.  is  currently on statin therapy. Please place order for annual  screening scan for  12/2020 and fax results to PCP. Thanks so much.

## 2020-01-12 ENCOUNTER — Other Ambulatory Visit: Payer: Self-pay | Admitting: *Deleted

## 2020-01-12 DIAGNOSIS — F17210 Nicotine dependence, cigarettes, uncomplicated: Secondary | ICD-10-CM

## 2020-01-12 DIAGNOSIS — Z87891 Personal history of nicotine dependence: Secondary | ICD-10-CM

## 2020-01-13 ENCOUNTER — Other Ambulatory Visit: Payer: Self-pay

## 2020-02-01 ENCOUNTER — Other Ambulatory Visit: Payer: Self-pay | Admitting: Interventional Cardiology

## 2020-02-03 MED ORDER — AMLODIPINE BESYLATE 5 MG PO TABS: ORAL_TABLET | ORAL | 2 refills | Status: DC

## 2020-02-03 MED ORDER — FUROSEMIDE 40 MG PO TABS: ORAL_TABLET | ORAL | 2 refills | Status: DC

## 2020-02-20 ENCOUNTER — Other Ambulatory Visit: Payer: Self-pay

## 2020-03-18 DIAGNOSIS — K5080 Crohn's disease of both small and large intestine without complications: Secondary | ICD-10-CM | POA: Diagnosis not present

## 2020-03-27 ENCOUNTER — Emergency Department (HOSPITAL_BASED_OUTPATIENT_CLINIC_OR_DEPARTMENT_OTHER)
Admission: EM | Admit: 2020-03-27 | Discharge: 2020-03-27 | Disposition: A | Discharge status: Home / Self Care | Payer: Medicare Other | Attending: Emergency Medicine | Admitting: Emergency Medicine

## 2020-03-27 ENCOUNTER — Other Ambulatory Visit: Payer: Self-pay

## 2020-03-27 ENCOUNTER — Encounter (HOSPITAL_BASED_OUTPATIENT_CLINIC_OR_DEPARTMENT_OTHER): Payer: Self-pay | Admitting: Emergency Medicine

## 2020-03-27 DIAGNOSIS — F17210 Nicotine dependence, cigarettes, uncomplicated: Secondary | ICD-10-CM | POA: Insufficient documentation

## 2020-03-27 DIAGNOSIS — Z7902 Long term (current) use of antithrombotics/antiplatelets: Secondary | ICD-10-CM | POA: Diagnosis not present

## 2020-03-27 DIAGNOSIS — Z955 Presence of coronary angioplasty implant and graft: Secondary | ICD-10-CM | POA: Diagnosis not present

## 2020-03-27 DIAGNOSIS — J449 Chronic obstructive pulmonary disease, unspecified: Secondary | ICD-10-CM | POA: Insufficient documentation

## 2020-03-27 DIAGNOSIS — R0981 Nasal congestion: Secondary | ICD-10-CM | POA: Diagnosis not present

## 2020-03-27 DIAGNOSIS — I10 Essential (primary) hypertension: Secondary | ICD-10-CM | POA: Insufficient documentation

## 2020-03-27 DIAGNOSIS — Z8616 Personal history of COVID-19: Secondary | ICD-10-CM | POA: Insufficient documentation

## 2020-03-27 DIAGNOSIS — I2510 Atherosclerotic heart disease of native coronary artery without angina pectoris: Secondary | ICD-10-CM | POA: Diagnosis not present

## 2020-03-27 DIAGNOSIS — R059 Cough, unspecified: Secondary | ICD-10-CM | POA: Insufficient documentation

## 2020-03-27 DIAGNOSIS — Z20822 Contact with and (suspected) exposure to covid-19: Secondary | ICD-10-CM | POA: Diagnosis not present

## 2020-03-27 DIAGNOSIS — Z79899 Other long term (current) drug therapy: Secondary | ICD-10-CM | POA: Insufficient documentation

## 2020-03-27 DIAGNOSIS — Z7982 Long term (current) use of aspirin: Secondary | ICD-10-CM | POA: Insufficient documentation

## 2020-03-27 LAB — RESPIRATORY PANEL BY RT PCR (FLU A&B, COVID)
Influenza A by PCR: NEGATIVE
Influenza B by PCR: NEGATIVE
SARS Coronavirus 2 by RT PCR: NEGATIVE

## 2020-03-27 MED ORDER — PREDNISONE 10 MG (21) PO TBPK
ORAL_TABLET | ORAL | 0 refills | Status: DC
Start: 2020-03-27 — End: 2020-06-09

## 2020-03-27 MED ORDER — METHYLPREDNISOLONE SODIUM SUCC 125 MG IJ SOLR
100.0000 mg | Freq: Once | INTRAMUSCULAR | Status: AC
  Administered 2020-03-27: 100 mg via INTRAMUSCULAR
  Filled 2020-03-27: qty 2

## 2020-03-27 NOTE — ED Provider Notes (Signed)
[facility] EMERGENCY DEPARTMENT Provider Note   CSN: [id number] Arrival date & time: 03/27/20  1358     History Chief Complaint  Patient presents with  . Cough    Chad Moss is a 64 y.o. male.  HPI      Chad Moss is a 64 y.o. male, with a history of seasonal/environmental allergies, HTN, hyperlipidemia, DVT, stroke, presenting to the ED primarily requesting testing for COVID-19. Endorses nasal congestion, rhinorrhea, sinus pressure, cough beginning about 3 days ago.  He has been using fluticasone nasal spray.  He has not needed to use his albuterol inhalers.  He has been vaccinated for Covid.  Denies fever/chills, shortness of breath, chest pain, lower extremity edema/pain, abdominal pain, N/V/D, or any other complaints.  Past Medical History:  Diagnosis Date  . Allergic rhinitis   . Anxiety   . Atelectasis   . CAD (coronary artery disease), native coronary artery    9/18 PCI/DES x1 to mLCx, mild diffuse nonobstructive disease, EF 55% on Lv gram  . Chronic bronchitis ([place])   . COPD (chronic obstructive pulmonary disease) ([place])   . Crohn's disease ([place])   . Depression   . DVT (deep venous thrombosis) ([facility])    RLE X 2  . Dysphagia   . Dyspnea   . Enlarged prostate   . GERD (gastroesophageal reflux disease)   . Glaucoma, both eyes   . Heart murmur   . History of stomach ulcers 1980s  . Hyperlipidemia   . Hypertension    "off RX for years now cause of coughing w/Lisinopril" (03/13/2017)  . IBS (irritable bowel syndrome)   . Paranoid schizophrenia ([place])   . Peripheral vascular disease ([place])   . Pneumonia 1990s  . Pre-diabetes    "one time" (03/13/2017)  . Stroke [facility]) 04/2016   "eye stroke; left eye" (03/13/2017)  . Syncopal episodes     Patient Active Problem List   Diagnosis Date Noted  . COVID-19 virus detected 11/25/2018  . Diarrhea 11/19/2018  . Fever in adult 11/19/2018  . Inadequate community support 11/19/2018  . Edema  03/09/2018  . Abnormal weight gain 03/09/2018  . Essential hypertension   . Coronary artery disease involving native coronary artery of native heart with angina pectoris ([place]) 03/13/2017  . Coronary artery calcification seen on CAT scan 01/12/2017  . COPD mixed type ([place]) 09/13/2015  . Cough 08/09/2015  . Flu-like symptoms 08/09/2015  . Acute sinusitis 08/09/2015  . AP (abdominal pain) 07/09/2014  . COLD (chronic obstructive lung disease) ([place]) 07/09/2014  . COPD exacerbation ([place]) 06/01/2014  . Other malaise and fatigue 09/06/2013  . Encounter for screening for lung cancer 10/09/2012  . Chronic cough 11/19/2011  . Tobacco abuse 04/06/2011  . Mixed hyperlipidemia 01/27/2009  . HYPERTENSION 01/27/2009  . PERIPHERAL VASCULAR DISEASE 01/27/2009  . Allergic rhinitis 01/27/2009  . Other emphysema ([place]) 01/27/2009  . DIZZINESS, CHRONIC 01/27/2009  . SHORTNESS OF BREATH (SOB) 01/27/2009  . Crohn's disease without complication ([place]) 35/00/9381    Past Surgical History:  Procedure Laterality Date  . ABDOMINAL HERNIA REPAIR  1990s  . COLECTOMY  1985; ?1989; 1996   "part of my ileum removed"  . CORONARY ANGIOPLASTY WITH STENT PLACEMENT  03/13/2017  . CORONARY STENT INTERVENTION N/A 03/13/2017   Procedure: CORONARY STENT INTERVENTION;  Surgeon: Jettie Booze, MD;  Location: [facility] CV LAB;  Service: Cardiovascular;  Laterality: N/A;  . EYE SURGERY Right    "laser; for glaucoma" (03/13/2017)  .  FASCIOTOMY Right   . HERNIA REPAIR    . LAPAROSCOPIC CHOLECYSTECTOMY    . LEFT HEART CATH AND CORONARY ANGIOGRAPHY N/A 03/13/2017   Procedure: LEFT HEART CATH AND CORONARY ANGIOGRAPHY;  Surgeon: Jettie Booze, MD;  Location: [facility];  Service: Cardiovascular;  Laterality: N/A;  . PENILE PROSTHESIS IMPLANT  01/2011   Archie Endo 02/01/2011  . PERIPHERAL VASCULAR CATHETERIZATION Right 1999   "had blood clots in my leg; went in to open them up; dye went into leg; ended up having  a fasiotomy"  . TONSILLECTOMY         Family History  Problem Relation Age of Onset  . Hypertension Mother   . Heart disease Mother   . Hypertension Father   . Heart disease Father   . Lung cancer Brother        2006    Social History   Tobacco Use  . Smoking status: Current Every Day Smoker    Packs/day: 2.00    Years: 50.00    Pack years: 100.00    Types: Cigarettes    Start date: 11/18/1968  . Smokeless tobacco: Never Used  . Tobacco comment: currently smoking 2ppd  Vaping Use  . Vaping Use: Never used  Substance Use Topics  . Alcohol use: No    Alcohol/week: 0.0 standard drinks  . Drug use: No    Home Medications Prior to Admission medications   Medication Sig Start Date End Date Taking? Authorizing Provider  albuterol (PROVENTIL) (2.5 MG/3ML) 0.083% nebulizer solution Take 3 mLs (2.5 mg total) by nebulization every 6 (six) hours as needed for wheezing or shortness of breath. 07/18/18   Brand Males, MD  albuterol (VENTOLIN HFA) 108 (90 Base) MCG/ACT inhaler INHALE 2 PUFFS BY MOUTH INTO THE LUNGS EVERY 6 HOURS AS NEEDED FOR WHEEZING OR SHORTNESS OF BREATH 01/07/20   Magdalen Spatz, NP  alfuzosin (UROXATRAL) 10 MG 24 hr tablet Take 10 mg by mouth at bedtime.     [provider]  amLODipine (NORVASC) 5 MG tablet TAKE 1 TABLET(5 MG) BY MOUTH DAILY 02/03/20   Jettie Booze, MD  aspirin EC 81 MG tablet Take 81 mg by mouth daily.    [provider]  bimatoprost (LUMIGAN) 0.01 % SOLN Place 1 drop into the left eye at bedtime.    [provider]  brimonidine (ALPHAGAN) 0.15 % ophthalmic solution Place 1 drop into the left eye 2 (two) times daily.     [provider]  cholestyramine Lucrezia Starch) 4 G packet Take 1 packet by mouth daily.     [provider]  clonazePAM (KLONOPIN) 1 MG tablet Take 1 mg by mouth at bedtime. Anxiety    [provider]  clopidogrel (PLAVIX) 75 MG tablet Take 1 tablet (75 mg total) by mouth  daily. 11/20/19   Jettie Booze, MD  diazepam (VALIUM) 2 MG tablet Take 1 tablet (2 mg total) by mouth 2 (two) times daily as needed for anxiety. 09/06/19   Couture, Cortni S, PA-C  dicyclomine (BENTYL) 10 MG capsule Take 10 mg by mouth 4 (four) times daily -  before meals and at bedtime.     [provider]  diphenhydrAMINE (BENADRYL) 25 MG tablet Take 50 mg by mouth at bedtime.     [provider]  finasteride (PROSCAR) 5 MG tablet Take 5 mg by mouth daily.    [provider]  fluticasone (FLONASE) 50 MCG/ACT nasal spray SHAKE LIQUID AND USE 1  SPRAY IN [facility] NOSTRIL DAILY 10/27/19   Martyn Ehrich, NP  furosemide (LASIX) 40 MG tablet Take 1 tablet 4 times a week 02/03/20   Jettie Booze, MD  isosorbide mononitrate (IMDUR) 30 MG 24 hr tablet TAKE 1 TABLET(30 MG) BY MOUTH DAILY 12/01/19   Jettie Booze, MD  loratadine (CLARITIN) 10 MG tablet Take 1 tablet (10 mg total) by mouth daily. 09/03/19 09/02/20  [name], FNP  loxapine (LOXITANE) 10 MG capsule Take 10 mg by mouth 2 (two) times daily. 1 tab in AM and 2 tabs in PM    [provider]  mercaptopurine (PURINETHOL) 50 MG tablet Take 75 mg by mouth daily. 1 tablet and a half tablet Give on an empty stomach 1 hour before or 2 hours after meals. Caution: Chemotherapy.    [provider]  mesalamine (PENTASA) 250 MG CR capsule Take 1,000 mg by mouth 4 (four) times daily.     [provider]  mirtazapine (REMERON) 30 MG tablet Take 45 mg by mouth at bedtime. 1.5 tablets by mouth daily 01/08/17   [provider]  nitroGLYCERIN (NITROSTAT) 0.4 MG SL tablet Place 1 tablet (0.4 mg total) under the tongue every 5 (five) minutes x 3 doses as needed for chest pain. 11/06/19   Jettie Booze, MD  pantoprazole (PROTONIX) 40 MG tablet TAKE 1 TABLET(40 MG) BY MOUTH TWICE DAILY 12/09/19   Jettie Booze, MD  phenol (CHLORASEPTIC GARGLE) 1.4 % LIQD Use as directed 1 spray in  the mouth or throat as needed for throat irritation / pain. 09/06/19   Couture, Cortni S, PA-C  potassium chloride (K-DUR) 10 MEQ tablet Take 1 tablet (10 mEq total) by mouth 2 (two) times daily. 08/01/17   Jettie Booze, MD  predniSONE (STERAPRED UNI-PAK 21 TAB) 10 MG (21) TBPK tablet Take 6 tabs (43m) day 1, 5 tabs (529m day 2, 4 tabs (4074mday 3, 3 tabs (57m67may 4, 2 tabs (20mg47my 5, and 1 tab (10mg)57m 6. 03/27/20   Govani Radloff C, PA-C  propranolol (INDERAL) 10 MG tablet Take by mouth. 12/08/19 12/07/20  [provider]  rosuvastatin (CRESTOR) 20 MG tablet TAKE 1 TABLET(20 MG) BY MOUTH DAILY 11/20/19   VaranaJettie BoozeSPIRIVA HANDIHALER 18 MCG inhalation capsule INHALE THE CONTENT OF 1 CAPSULE VIA HANDIHALER DAILY 07/28/19   RamaswBrand Malestamsulosin (FLOMAX) 0.4 MG CAPS capsule Take 0.4 mg by mouth at bedtime. 12/08/19   [provider]  timolol (TIMOPTIC) 0.5 % ophthalmic solution Place 1 drop into the left eye 2 (two) times daily. 07/21/16   [provider]  traZODone (DESYREL) 50 MG tablet Take 150 mg by mouth at bedtime.    [provider]    Allergies    Benztropine, Codeine, Meperidine, Cyclobenzaprine, Penicillins, Sulfamethoxazole-trimethoprim, and Sulfonamide derivatives  Review of Systems   Review of Systems  Constitutional: Negative for chills, diaphoresis and fever.  HENT: Positive for congestion, postnasal drip, rhinorrhea, sinus pressure and sore throat. Negative for facial swelling, trouble swallowing and voice change.   Respiratory: Positive for cough. Negative for shortness of breath.   Cardiovascular: Negative for chest pain.  Gastrointestinal: Negative for abdominal pain, diarrhea, nausea and vomiting.  Neurological: Negative for dizziness and syncope.  All other systems reviewed and are negative.   Physical Exam Updated Vital Signs BP (!) 132/92 (BP Location: Right Arm)   Pulse 92   Temp 98.6 F (37 C)  Resp 20   Ht 5' 11"  (1.803 m)   Wt 106.6 kg   SpO2 98%   BMI 32.78 kg/m   Physical Exam Vitals and nursing note reviewed.  Constitutional:      General: He is not in acute distress.    Appearance: He is well-developed. He is not diaphoretic.  HENT:     Head: Normocephalic and atraumatic.     Nose: Mucosal edema, congestion and rhinorrhea present.     Right Sinus: No maxillary sinus tenderness or frontal sinus tenderness.     Left Sinus: No maxillary sinus tenderness or frontal sinus tenderness.     Mouth/Throat:     Mouth: Mucous membranes are moist.     Pharynx: Oropharynx is clear. Uvula midline. No pharyngeal swelling, oropharyngeal exudate or posterior oropharyngeal erythema.  Eyes:     Conjunctiva/sclera: Conjunctivae normal.  Cardiovascular:     Rate and Rhythm: Normal rate and regular rhythm.     Pulses: Normal pulses.          Radial pulses are 2+ on the right side and 2+ on the left side.       Posterior tibial pulses are 2+ on the right side and 2+ on the left side.     Heart sounds: Normal heart sounds.     Comments: Tactile temperature in the extremities appropriate and equal bilaterally. Pulmonary:     Effort: Pulmonary effort is normal. No respiratory distress.     Breath sounds: Normal breath sounds.     Comments: No increased work of breathing.  Speaks in full sentences without difficulty. Abdominal:     Palpations: Abdomen is soft.     Tenderness: There is no abdominal tenderness. There is no guarding.  Musculoskeletal:     Cervical back: Neck supple.     Right lower leg: No edema.     Left lower leg: No edema.  Lymphadenopathy:     Cervical: No cervical adenopathy.  Skin:    General: Skin is warm and dry.  Neurological:     Mental Status: He is alert.  Psychiatric:        Mood and Affect: Mood and affect normal.        Speech: Speech normal.        Behavior: Behavior normal.     ED Results / Procedures / Treatments   Labs (all labs ordered are  listed, but only abnormal results are displayed) Labs Reviewed  RESPIRATORY PANEL BY RT PCR (FLU A&B, COVID)    EKG None  Radiology No results found.  Procedures Procedures (including critical care time)  Medications Ordered in ED Medications  methylPREDNISolone sodium succinate (SOLU-MEDROL) 125 mg/2 mL injection 100 mg (100 mg Intramuscular Given 03/27/20 1535)    ED Course  I have reviewed the triage vital signs and the nursing notes.  Pertinent labs & imaging results that were available during my care of the patient were reviewed by me and considered in my medical decision making (see chart for details).    MDM Rules/Calculators/A&P                          Patient presents with upper respiratory symptoms and cough. Patient is nontoxic appearing, afebrile, not tachycardic, not tachypneic, not hypotensive, maintains excellent SPO2 on room air, and is in no apparent distress.  Discussed chest x-ray, patient declined. Covid negative. The patient was given instructions for home care as well as return precautions. Patient  voices understanding of these instructions, accepts the plan, and is comfortable with discharge.  Findings and plan of care discussed with Madalyn Rob, MD. Dr. Roslynn Amble personally evaluated and examined this patient.   Vitals:   03/27/20 1406 03/27/20 1540  BP: 139/67 (!) 132/92  Pulse: 97 92  Resp: 20   Temp: 98.1 F (36.7 C) 98.6 F (37 C)  SpO2: 97% 98%  Weight: 106.6 kg   Height: 5' 11"  (1.803 m)     Final Clinical Impression(s) / ED Diagnoses Final diagnoses:  Cough  Nasal congestion    Rx / DC Orders ED Discharge Orders         Ordered    predniSONE (STERAPRED UNI-PAK 21 TAB) 10 MG (21) TBPK tablet        03/27/20 1533           Zakariye Nee C, PA-C 03/27/20 1640    Lucrezia Starch, MD 03/27/20 1645

## 2020-03-27 NOTE — Discharge Instructions (Signed)
Testing was negative today for Covid and influenza.   General Viral Syndrome Care Instructions:  Your symptoms are likely consistent with a viral illness. Viruses do not require or respond to antibiotics. Treatment is symptomatic care and it is important to note that these symptoms may last for 7-14 days.   Hand washing: Wash your hands throughout the day, but especially before and after touching the face, using the restroom, sneezing, coughing, or touching surfaces that have been coughed or sneezed upon. Hydration: Symptoms of most illnesses will be intensified and complicated by dehydration. Dehydration can also extend the duration of symptoms. Drink plenty of fluids and get plenty of rest. You should be drinking at least half a liter of water an hour to stay hydrated. Electrolyte drinks (ex. Gatorade, Powerade, Pedialyte) are also encouraged. You should be drinking enough fluids to make your urine light yellow, almost clear. If this is not the case, you are not drinking enough water. Please note that some of the treatments indicated below will not be effective if you are not adequately hydrated. Diet: Please concentrate on hydration, however, you may introduce food slowly.  Start with a clear liquid diet, progressed to a full liquid diet, and then bland solids as you are able. Pain: Acetaminophen (generic for Tylenol) for pain.  Cough: Teas, warm liquids, broths, and honey can help with cough. Albuterol: May use your albuterol as needed for instances of shortness of breath. Prednisone: Take the prednisone, as directed, in its entirety. Zyrtec or Claritin: May add these medication daily to control underlying symptoms of congestion, sneezing, and other signs of allergies.  These medications are available over-the-counter. Generics: Cetirizine (generic for Zyrtec) and loratadine (generic for Claritin). Fluticasone: Use fluticasone (generic for Flonase), as directed, for nasal and sinus congestion.   This medication is available over-the-counter. Congestion: Plain guaifenesin (generic for plain Mucinex) may help relieve congestion. Saline sinus rinses and saline nasal sprays may also help relieve congestion.  Sore throat: Warm liquids or Chloraseptic spray may help soothe a sore throat. Gargle twice a day with a salt water solution made from a half teaspoon of salt in a cup of warm water.  Follow up: Follow up with a primary care provider within the next two weeks should symptoms fail to resolve. Return: Return to the ED for significantly worsening symptoms, shortness of breath, persistent vomiting, large amounts of blood in stool, or any other major concerns.  For prescription assistance, may try using prescription discount sites or apps, such as goodrx.com

## 2020-03-27 NOTE — ED Triage Notes (Signed)
Pt arrives POV with caregiver with c/o nasal congestion, sore throat and cough x 4 days. Pt denies fever. Endorses having covid vaccine.

## 2020-04-06 ENCOUNTER — Telehealth: Payer: Self-pay

## 2020-04-06 NOTE — Telephone Encounter (Signed)
I left a message asking the pt's caregiver to call and reschedule AWV with [place].

## 2020-04-14 ENCOUNTER — Telehealth: Payer: Self-pay | Admitting: Internal Medicine

## 2020-04-14 ENCOUNTER — Other Ambulatory Visit: Payer: Self-pay

## 2020-04-14 MED ORDER — DOXYCYCLINE HYCLATE 100 MG PO TABS
100.0000 mg | ORAL_TABLET | Freq: Two times a day (BID) | ORAL | 0 refills | Status: DC
Start: 2020-04-14 — End: 2020-06-09

## 2020-04-14 MED ORDER — PREDNISONE 10 MG PO TABS
ORAL_TABLET | ORAL | 0 refills | Status: DC
Start: 2020-04-14 — End: 2020-06-09

## 2020-04-14 MED ORDER — PREDNISONE 10 MG PO TABS
ORAL_TABLET | ORAL | 0 refills | Status: DC
Start: 2020-04-14 — End: 2020-04-14

## 2020-04-14 NOTE — Telephone Encounter (Signed)
Spoke with pt and called in prednisone and doxycycline to preferred pharmacy. Nothing further needed at this time.

## 2020-04-14 NOTE — Telephone Encounter (Signed)
Called and spoke [place] ([place]). Mliss Sax stated that pt was was seen at [facility] ED on 03/27/2020 for cough and nasal congestion. Patient was given prednisone.  Patient completed course of prednisone on 04/03/2020. He had some improvement in cough, however cough is still present and is productive with yellow mucus and nasal congestion yellow in color.  Denied fever, chills, sweats, sob and wheezing.  He has had both covid vaccines.  Using albuterol HFA BID and Spiriva once daily with no relief in sx.  Taking delsym QHS with mild relief in sx.   Dr. Chase Caller, please advise. Thanks

## 2020-04-14 NOTE — Telephone Encounter (Signed)
His Covid was negative in March 27, 2020.  Recommend for COPD exacerbation  Please take prednisone 40 mg x1 day, then 30 mg x1 day, then 20 mg x1 day, then 10 mg x1 day, and then 5 mg x1 day and stop   And abx  Take doxycycline 175m po twice daily x 5 days; take after meals and avoid sunlight  Please ensure no allergies   Allergies  Allergen Reactions  . Benztropine Anaphylaxis  . Codeine Anaphylaxis  . Meperidine Swelling  . Cyclobenzaprine Other (See Comments)    Pt. Does not remember  . Penicillins Rash    Has patient had a PCN reaction causing immediate rash, facial/tongue/throat swelling, SOB or lightheadedness with hypotension: Yes Has patient had a PCN reaction causing severe rash involving mucus membranes or skin necrosis: No Has patient had a PCN reaction that required hospitalization No Has patient had a PCN reaction occurring within the last 10 years: No If all of the above answers are "NO", then may proceed with Cephalosporin use.   . Sulfamethoxazole-Trimethoprim     REACTION: hives  . Sulfonamide Derivatives     REACTION: hives     Immunization History  Administered Date(s) Administered  . Influenza Split 04/06/2011, 03/12/2012, 03/31/2015  . Influenza Whole 05/10/2009  . Influenza,inj,Quad PF,6+ Mos 03/04/2013, 04/02/2017, 03/28/2018  . Influenza-Unspecified 04/19/2014  . PFIZER SARS-COV-2 Vaccination 11/20/2018, 12/17/2019  . Pneumococcal Conjugate-13 01/27/2009  . Pneumococcal Polysaccharide-23 06/19/2008  . Tdap 12/27/2017

## 2020-04-15 ENCOUNTER — Other Ambulatory Visit: Payer: Self-pay | Admitting: Internal Medicine

## 2020-04-15 MED ORDER — PREDNISONE 10 MG PO TABS
ORAL_TABLET | ORAL | 0 refills | Status: DC
Start: 2020-04-15 — End: 2020-05-12

## 2020-04-27 ENCOUNTER — Telehealth: Payer: Self-pay

## 2020-04-27 NOTE — Telephone Encounter (Signed)
I called the patient's caregiver to reschedule AWV.  She answered, but the call dropped/ended.  I called back and got voicemail. Left message to reschedule 11/16 AWV only.

## 2020-05-04 ENCOUNTER — Ambulatory Visit: Payer: Medicare Other | Admitting: Nurse Practitioner

## 2020-05-12 ENCOUNTER — Telehealth: Payer: Self-pay | Admitting: Internal Medicine

## 2020-05-12 DIAGNOSIS — J449 Chronic obstructive pulmonary disease, unspecified: Secondary | ICD-10-CM

## 2020-05-12 MED ORDER — AZITHROMYCIN 250 MG PO TABS: ORAL_TABLET | ORAL | 0 refills | Status: DC

## 2020-05-12 MED ORDER — PREDNISONE 10 MG PO TABS: ORAL_TABLET | ORAL | 0 refills | Status: DC

## 2020-05-12 NOTE — Telephone Encounter (Addendum)
Spoke with patient family member Mliss Sax, aware of below. Nothing further needed.

## 2020-05-12 NOTE — Telephone Encounter (Signed)
Please give an first available office visit to see a nurse practitioner  Repeat abx  Can do Z pak  Please take prednisone 40 mg x1 day, then 30 mg x1 day, then 20 mg x1 day, then 10 mg x1 day, and then 5 mg x1 day and stop   Send dme script for neb macine    Allergies  Allergen Reactions  . Benztropine Anaphylaxis  . Codeine Anaphylaxis  . Meperidine Swelling  . Cyclobenzaprine Other (See Comments)    Pt. Does not remember  . Penicillins Rash    Has patient had a PCN reaction causing immediate rash, facial/tongue/throat swelling, SOB or lightheadedness with hypotension: Yes Has patient had a PCN reaction causing severe rash involving mucus membranes or skin necrosis: No Has patient had a PCN reaction that required hospitalization No Has patient had a PCN reaction occurring within the last 10 years: No If all of the above answers are "NO", then may proceed with Cephalosporin use.   . Sulfamethoxazole-Trimethoprim     REACTION: hives  . Sulfonamide Derivatives     REACTION: hives

## 2020-05-12 NOTE — Telephone Encounter (Signed)
Called and spoke with Mliss Sax and she stated that the pt cannot get rid of the cough that he has.  He finished his doxy that was given the end of October and this did not clear everything up. He is having a cough that will not clear up and he is sometimes coughing up clear/yellow sputum.  She is asking for a new nebulizer machine as she has not been able to find his for a year now.  Please advise  Allergies  Allergen Reactions  . Benztropine Anaphylaxis  . Codeine Anaphylaxis  . Meperidine Swelling  . Cyclobenzaprine Other (See Comments)    Pt. Does not remember  . Penicillins Rash    Has patient had a PCN reaction causing immediate rash, facial/tongue/throat swelling, SOB or lightheadedness with hypotension: Yes Has patient had a PCN reaction causing severe rash involving mucus membranes or skin necrosis: No Has patient had a PCN reaction that required hospitalization No Has patient had a PCN reaction occurring within the last 10 years: No If all of the above answers are "NO", then may proceed with Cephalosporin use.   . Sulfamethoxazole-Trimethoprim     REACTION: hives  . Sulfonamide Derivatives     REACTION: hives

## 2020-05-12 NOTE — Telephone Encounter (Signed)
I attempted to call Mliss Sax but the call was forwarded to her VM.  I have left a VM but advised her to call back. meds have been sent to the pharmacy.

## 2020-05-17 DIAGNOSIS — J449 Chronic obstructive pulmonary disease, unspecified: Secondary | ICD-10-CM | POA: Diagnosis not present

## 2020-06-02 ENCOUNTER — Ambulatory Visit: Payer: Medicare Other | Admitting: Nurse Practitioner

## 2020-06-09 ENCOUNTER — Ambulatory Visit (INDEPENDENT_AMBULATORY_CARE_PROVIDER_SITE_OTHER): Payer: Medicare Other | Admitting: Nurse Practitioner

## 2020-06-09 ENCOUNTER — Other Ambulatory Visit: Payer: Self-pay

## 2020-06-09 ENCOUNTER — Encounter: Payer: Self-pay | Admitting: Nurse Practitioner

## 2020-06-09 VITALS — BP 122/70 | HR 88 | Temp 98.2°F | Ht 71.0 in | Wt 233.4 lb

## 2020-06-09 DIAGNOSIS — I10 Essential (primary) hypertension: Secondary | ICD-10-CM

## 2020-06-09 DIAGNOSIS — K5090 Crohn's disease, unspecified, without complications: Secondary | ICD-10-CM

## 2020-06-09 DIAGNOSIS — Z0000 Encounter for general adult medical examination without abnormal findings: Secondary | ICD-10-CM | POA: Diagnosis not present

## 2020-06-09 DIAGNOSIS — Z23 Encounter for immunization: Secondary | ICD-10-CM

## 2020-06-09 LAB — POCT URINALYSIS DIPSTICK
Bilirubin, UA: NEGATIVE
Blood, UA: NEGATIVE
Glucose, UA: NEGATIVE
Leukocytes, UA: NEGATIVE
Nitrite, UA: NEGATIVE
Protein, UA: POSITIVE — AB
Spec Grav, UA: 1.025 (ref 1.010–1.025)
Urobilinogen, UA: 0.2 E.U./dL
pH, UA: 6 (ref 5.0–8.0)

## 2020-06-09 LAB — POCT UA - MICROALBUMIN
Creatinine, POC: 300 mg/dL
Microalbumin Ur, POC: 80 mg/L

## 2020-06-09 NOTE — Patient Instructions (Addendum)
.   Chad Moss , Thank you for taking time to come for your Medicare Wellness Visit. I appreciate your ongoing commitment to your health goals. Please review the following plan we discussed and let me know if I can assist you in the future.   These are the goals we discussed: Goals    . Patient Stated     04/30/2019, no goals       This is a list of the screening recommended for you and due dates:  Health Maintenance  Topic Date Due  . Pneumonia vaccines (2 of 2 - PPSV23) 10/14/2018  . Flu Shot  01/18/2020  . COVID-19 Vaccine (3 - Booster for Pfizer series) 06/17/2020  . Colon Cancer Screening  03/25/2024  . Tetanus Vaccine  12/28/2027  .  Hepatitis C: One time screening is recommended by Center for Disease Control  (CDC) for  adults born from [birth year] through 1967.   Completed

## 2020-06-09 NOTE — Progress Notes (Signed)
Subjective:   Chad Moss is a 64 y.o. male who presents for Medicare Annual/Subsequent preventive examination.  Review of Systems           Objective:    Today's Vitals   06/09/20 1023  BP: 122/70  Pulse: 88  Temp: 98.2 F (36.8 C)  TempSrc: Oral  Weight: 233 lb 6.4 oz (105.9 kg)  Height: 5' 11"  (1.803 m)  PainSc: 0-No pain   Body mass index is 32.55 kg/m.  Advanced Directives 06/09/2020 03/27/2020 12/29/2019 09/11/2019 09/06/2019 04/30/2019 11/19/2018  Does Patient Have a Medical Advance Directive? No No No Yes No Yes No  Type of Advance Directive - - - Event organiser -  Does patient want to make changes to medical advance directive? - - - - - - -  Copy of Press photographer in Chart? - - - - - No - copy requested -  Would patient like information on creating a medical advance directive? Yes (MAU/Ambulatory/Procedural Areas - Information given) - - - No - Patient declined - No - Guardian declined    Current Medications (verified) Outpatient Encounter Medications as of 06/09/2020  Medication Sig  . albuterol (PROVENTIL) (2.5 MG/3ML) 0.083% nebulizer solution Take 3 mLs (2.5 mg total) by nebulization every 6 (six) hours as needed for wheezing or shortness of breath.  Marland Kitchen albuterol (VENTOLIN HFA) 108 (90 Base) MCG/ACT inhaler INHALE 2 PUFFS BY MOUTH INTO THE LUNGS EVERY 6 HOURS AS NEEDED FOR WHEEZING OR SHORTNESS OF BREATH  . alfuzosin (UROXATRAL) 10 MG 24 hr tablet Take 10 mg by mouth at bedtime.  Marland Kitchen amLODipine (NORVASC) 5 MG tablet TAKE 1 TABLET(5 MG) BY MOUTH DAILY  . aspirin EC 81 MG tablet Take 81 mg by mouth daily.  . bimatoprost (LUMIGAN) 0.01 % SOLN Place 1 drop into the left eye at bedtime.  . brimonidine (ALPHAGAN) 0.15 % ophthalmic solution Place 1 drop into the left eye 2 (two) times daily.  . cholestyramine (QUESTRAN) 4 G packet Take 1 packet by mouth daily.  . clonazePAM (KLONOPIN) 1 MG tablet Take 1 mg by  mouth at bedtime. Anxiety  . clopidogrel (PLAVIX) 75 MG tablet Take 1 tablet (75 mg total) by mouth daily.  Marland Kitchen dicyclomine (BENTYL) 10 MG capsule Take 10 mg by mouth 4 (four) times daily -  before meals and at bedtime.  . diphenhydrAMINE (BENADRYL) 25 MG tablet Take 50 mg by mouth at bedtime.   . finasteride (PROSCAR) 5 MG tablet Take 5 mg by mouth daily.  . fluticasone (FLONASE) 50 MCG/ACT nasal spray SHAKE LIQUID AND USE 1 SPRAY IN EACH NOSTRIL DAILY  . furosemide (LASIX) 40 MG tablet Take 1 tablet 4 times a week  . isosorbide mononitrate (IMDUR) 30 MG 24 hr tablet TAKE 1 TABLET(30 MG) BY MOUTH DAILY  . loratadine (CLARITIN) 10 MG tablet Take 1 tablet (10 mg total) by mouth daily.  Marland Kitchen loxapine (LOXITANE) 10 MG capsule Take 10 mg by mouth 2 (two) times daily. 1 tab in AM and 2 tabs in PM  . mercaptopurine (PURINETHOL) 50 MG tablet Take 75 mg by mouth daily. 1 tablet and a half tablet Give on an empty stomach 1 hour before or 2 hours after meals. Caution: Chemotherapy.  . mesalamine (PENTASA) 250 MG CR capsule Take 1,000 mg by mouth 4 (four) times daily.   . mirtazapine (REMERON) 30 MG tablet Take 45 mg by mouth at bedtime. 1.5 tablets by mouth  daily  . nitroGLYCERIN (NITROSTAT) 0.4 MG SL tablet Place 1 tablet (0.4 mg total) under the tongue every 5 (five) minutes x 3 doses as needed for chest pain.  . pantoprazole (PROTONIX) 40 MG tablet TAKE 1 TABLET(40 MG) BY MOUTH TWICE DAILY  . phenol (CHLORASEPTIC GARGLE) 1.4 % LIQD Use as directed 1 spray in the mouth or throat as needed for throat irritation / pain.  . potassium chloride (K-DUR) 10 MEQ tablet Take 1 tablet (10 mEq total) by mouth 2 (two) times daily.  . propranolol (INDERAL) 10 MG tablet Take by mouth.  . rosuvastatin (CRESTOR) 20 MG tablet TAKE 1 TABLET(20 MG) BY MOUTH DAILY  . SPIRIVA HANDIHALER 18 MCG inhalation capsule INHALE THE CONTENT OF 1 CAPSULE VIA HANDIHALER DAILY  . tamsulosin (FLOMAX) 0.4 MG CAPS capsule Take 0.4 mg by mouth  at bedtime.  . timolol (TIMOPTIC) 0.5 % ophthalmic solution Place 1 drop into the left eye 2 (two) times daily.  . traZODone (DESYREL) 50 MG tablet Take 150 mg by mouth at bedtime.  . [DISCONTINUED] azithromycin (ZITHROMAX) 250 MG tablet Take 2 tablets by mouth now, then one daily until gone  . [DISCONTINUED] diazepam (VALIUM) 2 MG tablet Take 1 tablet (2 mg total) by mouth 2 (two) times daily as needed for anxiety.  . [DISCONTINUED] doxycycline (VIBRA-TABS) 100 MG tablet Take 1 tablet (100 mg total) by mouth 2 (two) times daily.  . [DISCONTINUED] predniSONE (DELTASONE) 10 MG tablet 52m X1 day 327mX1 day  2028m 1 day 22m36m1 day then stop  . [DISCONTINUED] predniSONE (DELTASONE) 10 MG tablet 4 x 1 day, 3 x 1 day, 2 x 1 day, 1 x 1 day, 1/2 x 1 day then stop  . [DISCONTINUED] predniSONE (STERAPRED UNI-PAK 21 TAB) 10 MG (21) TBPK tablet Take 6 tabs (60mg75my 1, 5 tabs (50mg)62m 2, 4 tabs (40mg) 65m3, 3 tabs (30mg) d80m, 2 tabs (20mg) da54m and 1 tab (22mg) day53m  No facility-administered encounter medications on file as of 06/09/2020.    Allergies (verified) Benztropine, Codeine, Meperidine, Cyclobenzaprine, Penicillins, Sulfamethoxazole-trimethoprim, and Sulfonamide derivatives   History: Past Medical History:  Diagnosis Date  . Allergic rhinitis   . Anxiety   . Atelectasis   . CAD (coronary artery disease), native coronary artery    9/18 PCI/DES x1 to mLCx, mild diffuse nonobstructive disease, EF 55% on Lv gram  . Chronic bronchitis (HCC)   . [place] (chronic obstructive pulmonary disease) (HCC)   . CBatavian's disease (HCC)   . [place]   . DVT (deep venous thrombosis) (HCC)    RLE X 2  . Dysphagia   . Dyspnea   . Enlarged prostate   . GERD (gastroesophageal reflux disease)   . Glaucoma, both eyes   . Heart murmur   . History of stomach ulcers 1980s  . Hyperlipidemia   . Hypertension    "off RX for years now cause of coughing w/Lisinopril" (03/13/2017)  . IBS  (irritable bowel syndrome)   . Paranoid schizophrenia (HCC)   . PUtopiapheral vascular disease (HCC)   . [place] 1990[place]  . Pre-diabetes    "one time" (03/13/2017)  . Stroke (HCC) [facility]/2[facility]  "eye stroke; left eye" (03/13/2017)  . Syncopal episodes    Past Surgical History:  Procedure Laterality Date  . ABDOMINAL HERNIA REPAIR  1990s  . COLECTOMY  1985; ?1989; 1996   "part of my ileum removed"  . CORONARY ANGIOPLASTY WITH STENT PLACEMENT  03/13/2017  . CORONARY STENT INTERVENTION N/A 03/13/2017   Procedure: CORONARY STENT INTERVENTION;  Surgeon: Jettie Booze, MD;  Location: [place] [facility];  Service: Cardiovascular;  Laterality: N/A;  . EYE SURGERY Right    "laser; for glaucoma" (03/13/2017)  . FASCIOTOMY Right   . HERNIA REPAIR    . LAPAROSCOPIC CHOLECYSTECTOMY    . LEFT HEART CATH AND CORONARY ANGIOGRAPHY N/A 03/13/2017   Procedure: LEFT HEART CATH AND CORONARY ANGIOGRAPHY;  Surgeon: Jettie Booze, MD;  Location: [facility];  Service: Cardiovascular;  Laterality: N/A;  . PENILE PROSTHESIS IMPLANT  01/2011   Archie Endo 02/01/2011  . PERIPHERAL VASCULAR CATHETERIZATION Right 1999   "had blood clots in my leg; went in to open them up; dye went into leg; ended up having a fasiotomy"  . TONSILLECTOMY     Family History  Problem Relation Age of Onset  . Hypertension Mother   . Heart disease Mother   . Hypertension Father   . Heart disease Father   . Lung cancer Brother        2006   Social History   Socioeconomic History  . Marital status: Single    Spouse name: Not on file  . Number of children: Not on file  . Years of education: 12 grade level   . Highest education level: 12th grade  Occupational History  . Occupation: disabled  Tobacco Use  . Smoking status: Current Every Day Smoker    Packs/day: 2.00    Years: 50.00    Pack years: 100.00    Types: Cigarettes    Start date: 11/18/1968  . Smokeless tobacco: Never Used  . Tobacco comment: currently  smoking 2ppd  Vaping Use  . Vaping Use: Never used  Substance and Sexual Activity  . Alcohol use: No    Alcohol/week: 0.0 standard drinks  . Drug use: No  . Sexual activity: Not on file  Other Topics Concern  . Not on file  Social History Narrative  . Not on file   Social Determinants of Health   Financial Resource Strain: Low Risk   . Difficulty of Paying Living Expenses: Not hard at all  Food Insecurity: No Food Insecurity  . Worried About Charity fundraiser in the Last Year: Never true  . Ran Out of Food in the Last Year: Never true  Transportation Needs: No Transportation Needs  . Lack of Transportation (Medical): No  . Lack of Transportation (Non-Medical): No  Physical Activity: Inactive  . Days of Exercise per Week: 0 days  . Minutes of Exercise per Session: 10 min  Stress: No Stress Concern Present  . Feeling of Stress : Only a little  Social Connections: Unknown  . Frequency of Communication with Friends and Family: Once a week  . Frequency of Social Gatherings with Friends and Family: Never  . Attends Religious Services: Never  . Active Member of Clubs or Organizations: No  . Attends Archivist Meetings: Never  . Marital Status: Not on file    Tobacco Counseling Ready to Quit: No   Activities of Daily Living In your present state of health, do you have any difficulty performing the following activities: 06/09/2020  Hearing? N  Vision? Y  Difficulty concentrating or making decisions? Y  Walking or climbing stairs? N  Dressing or bathing? Y  Doing errands, shopping? Y  Some recent data might be hidden    Patient Care Team: [name], FNP as PCP - General (  General Practice) Jettie Booze, MD as PCP - Cardiology (Cardiology)  Indicate any recent Medical Services you may have received from other than [facility] providers in the past year (date may be approximate).     Assessment:   This is a routine wellness examination for  [place].  Hearing/Vision screen No exam data present  Dietary issues and exercise activities discussed:    Goals    . Exercise 150 min/wk Moderate Activity     He would like to work on his diet and exercise more.     . Patient Stated     04/30/2019, no goals      Depression Screen PHQ 2/9 Scores 06/09/2020 04/30/2019 11/25/2018 10/09/2018 07/05/2018 03/28/2018 04/17/2017  PHQ - 2 Score 0 0 0 0 4 6 1   PHQ- 9 Score - 0 - - 13 18 -    Fall Risk Fall Risk  06/09/2020 06/09/2020 04/30/2019 11/25/2018 10/09/2018  Falls in the past year? 0 0 0 0 0  Number falls in past yr: - 0 0 - -  Injury with Fall? - 0 - - -  Risk for fall due to : Medication side effect - Medication side effect - -  Follow up Falls prevention discussed - Falls evaluation completed;Education provided;Falls prevention discussed - -    FALL RISK PREVENTION PERTAINING TO THE HOME:  Any stairs in or around the home? Yes  If so, are there any without handrails? Yes Home free of loose throw rugs in walkways, pet beds, electrical cords, etc? Yes  Adequate lighting in your home to reduce risk of falls? Yes  ASSISTIVE DEVICES UTILIZED TO PREVENT FALLS:  Life alert? No Use of a cane, walker or w/c? No  Grab bars in the bathroom? Yes  Shower chair or bench in shower? Yes  Elevated toilet seat or a handicapped toilet? Yes   TIMED UP AND GO:  Was the test performed? No  Length of time to ambulate 10 feet:    Cognitive Function: MMSE - Mini Mental State Exam 06/09/2020  Orientation to time 5  Orientation to Place 1  Registration 3  Attention/ Calculation 5  Recall 3  Language- name 2 objects 2  Language- repeat 1  Language- follow 3 step command 3  Language- read & follow direction 1  Copy design 1     6CIT Screen 06/09/2020 04/30/2019 03/28/2018  What Year? 0 points 0 points 0 points  What month? 0 points 0 points 0 points  What time? 0 points 0 points 0 points  Count back from 20 0 points 0 points 2  points  Months in reverse 0 points 4 points 0 points  Repeat phrase 0 points 0 points 0 points  Total Score 0 4 2    Immunizations Immunization History  Administered Date(s) Administered  . Influenza Split 04/06/2011, 03/12/2012, 03/31/2015  . Influenza Whole 05/10/2009  . Influenza,inj,Quad PF,6+ Mos 03/04/2013, 04/02/2017, 03/28/2018  . Influenza-Unspecified 04/19/2014  . PFIZER SARS-COV-2 Vaccination 11/20/2018, 12/17/2019  . Pneumococcal Conjugate-13 01/27/2009  . Pneumococcal Polysaccharide-23 06/19/2008  . Tdap 12/27/2017    Pneumococcal polysaccharide-23 06/09/2020 COVID vaccine: 12/17/2019; 11/20/2018  Qualifies for Shingles Vaccine? Yes   Screening Tests Health Maintenance  Topic Date Due  . PNA vac Low Risk Adult (2 of 2 - PPSV23) 10/14/2018  . INFLUENZA VACCINE  01/18/2020  . COVID-19 Vaccine (3 - Booster for Pfizer series) 06/17/2020  . COLONOSCOPY  03/25/2024  . TETANUS/TDAP  12/28/2027  . Hepatitis C Screening  Completed  Health Maintenance  Health Maintenance Due  Topic Date Due  . PNA vac Low Risk Adult (2 of 2 - PPSV23) 10/14/2018  . INFLUENZA VACCINE  01/18/2020     Lung Cancer Screening:  Qualifies   Lung Cancer Screening Referral:   Additional Screening:  Hepatitis C Screening: completed 09/2013  Vision Screening: Recommended annual ophthalmology exams for early detection of glaucoma and other disorders of the eye. Is the patient up to date with their annual eye exam?  Yes  Who is the provider or what is the name of the office in which the patient attends annual eye exams?  If pt is not established with a provider, would they like to be referred to a provider to establish care? Established   Dental Screening: Recommended annual dental exams for proper oral hygiene  Community Resource Referral / Chronic Care Management: CRR required this visit?  No  CCM required this visit? No      Plan:     Patient has goals to start exercising  more and eating better. We discussed the patient doing exercise at home and having a better healthy lifestyle.   I have personally reviewed and noted the following in the patient's chart:   . Medical and social history . Use of alcohol, tobacco or illicit drugs  . Current medications and supplements . Functional ability and status . Nutritional status . Physical activity . Advanced directives . List of other physicians . Hospitalizations, surgeries, and ER visits in previous 12 months . Vitals . Screenings to include cognitive, depression, and falls . Referrals and appointments  In addition, I have reviewed and discussed with patient certain preventive protocols, quality metrics, and best practice recommendations. A written personalized care plan for preventive services as well as general preventive health recommendations were provided to patient.     Bary Castilla, NP   06/09/2020

## 2020-06-10 LAB — CBC WITH DIFFERENTIAL/PLATELET
Basophils Absolute: 0 10*3/uL (ref 0.0–0.2)
Basos: 0 %
EOS (ABSOLUTE): 0.1 10*3/uL (ref 0.0–0.4)
Eos: 1 %
Hematocrit: 39.2 % (ref 37.5–51.0)
Hemoglobin: 13.7 g/dL (ref 13.0–17.7)
Immature Grans (Abs): 0 10*3/uL (ref 0.0–0.1)
Immature Granulocytes: 0 %
Lymphocytes Absolute: 1.7 10*3/uL (ref 0.7–3.1)
Lymphs: 22 %
MCH: 31.6 pg (ref 26.6–33.0)
MCHC: 34.9 g/dL (ref 31.5–35.7)
MCV: 91 fL (ref 79–97)
Monocytes Absolute: 0.5 10*3/uL (ref 0.1–0.9)
Monocytes: 6 %
Neutrophils Absolute: 5.6 10*3/uL (ref 1.4–7.0)
Neutrophils: 71 %
Platelets: 228 10*3/uL (ref 150–450)
RBC: 4.33 x10E6/uL (ref 4.14–5.80)
RDW: 14.3 % (ref 11.6–15.4)
WBC: 7.9 10*3/uL (ref 3.4–10.8)

## 2020-06-10 LAB — CMP14+EGFR
ALT: 18 IU/L (ref 0–44)
AST: 26 IU/L (ref 0–40)
Albumin/Globulin Ratio: 1.5 (ref 1.2–2.2)
Albumin: 4.1 g/dL (ref 3.8–4.8)
Alkaline Phosphatase: 78 IU/L (ref 44–121)
BUN/Creatinine Ratio: 7 — ABNORMAL LOW (ref 10–24)
BUN: 6 mg/dL — ABNORMAL LOW (ref 8–27)
Bilirubin Total: 0.3 mg/dL (ref 0.0–1.2)
CO2: 25 mmol/L (ref 20–29)
Calcium: 9.4 mg/dL (ref 8.6–10.2)
Chloride: 102 mmol/L (ref 96–106)
Creatinine, Ser: 0.83 mg/dL (ref 0.76–1.27)
GFR calc Af Amer: 106 mL/min/{1.73_m2} (ref >59)
GFR calc non Af Amer: 92 mL/min/{1.73_m2} (ref >59)
Globulin, Total: 2.8 g/dL (ref 1.5–4.5)
Glucose: 99 mg/dL (ref 65–99)
Potassium: 3.5 mmol/L (ref 3.5–5.2)
Sodium: 142 mmol/L (ref 134–144)
Total Protein: 6.9 g/dL (ref 6.0–8.5)

## 2020-06-10 LAB — C-REACTIVE PROTEIN: CRP: 2 mg/L (ref 0–10)

## 2020-06-24 ENCOUNTER — Other Ambulatory Visit: Payer: Self-pay

## 2020-06-24 ENCOUNTER — Other Ambulatory Visit (INDEPENDENT_AMBULATORY_CARE_PROVIDER_SITE_OTHER): Payer: Medicare Other

## 2020-06-24 ENCOUNTER — Other Ambulatory Visit: Payer: Medicare Other

## 2020-06-24 DIAGNOSIS — R809 Proteinuria, unspecified: Secondary | ICD-10-CM | POA: Diagnosis not present

## 2020-06-24 LAB — POCT URINALYSIS DIPSTICK
Bilirubin, UA: NEGATIVE
Blood, UA: NEGATIVE
Glucose, UA: NEGATIVE
Ketones, UA: NEGATIVE
Leukocytes, UA: NEGATIVE
Nitrite, UA: NEGATIVE
Protein, UA: NEGATIVE
Spec Grav, UA: 1.025 (ref 1.010–1.025)
Urobilinogen, UA: 0.2 E.U./dL
pH, UA: 5.5 (ref 5.0–8.0)

## 2020-06-24 NOTE — Progress Notes (Signed)
Repeat urine

## 2020-06-29 ENCOUNTER — Telehealth: Payer: Self-pay

## 2020-06-30 NOTE — Telephone Encounter (Signed)
error 

## 2020-07-30 ENCOUNTER — Other Ambulatory Visit: Payer: Self-pay

## 2020-07-30 ENCOUNTER — Other Ambulatory Visit: Payer: Self-pay | Admitting: Interventional Cardiology

## 2020-07-30 MED ORDER — CLOPIDOGREL BISULFATE 75 MG PO TABS: 75.0000 mg | ORAL_TABLET | Freq: Every day | ORAL | 0 refills | Status: DC

## 2020-08-06 ENCOUNTER — Other Ambulatory Visit: Payer: Self-pay | Admitting: Acute Care

## 2020-08-29 ENCOUNTER — Other Ambulatory Visit: Payer: Self-pay | Admitting: Interventional Cardiology

## 2020-09-10 ENCOUNTER — Other Ambulatory Visit: Payer: Self-pay | Admitting: Interventional Cardiology

## 2020-09-13 ENCOUNTER — Other Ambulatory Visit: Payer: Self-pay

## 2020-09-28 ENCOUNTER — Encounter: Payer: Self-pay | Admitting: Nurse Practitioner

## 2020-09-29 ENCOUNTER — Other Ambulatory Visit: Payer: Self-pay

## 2020-09-29 MED ORDER — PANTOPRAZOLE SODIUM 40 MG PO TBEC: DELAYED_RELEASE_TABLET | ORAL | 0 refills | Status: DC

## 2020-10-06 ENCOUNTER — Other Ambulatory Visit: Payer: Self-pay

## 2020-10-06 DIAGNOSIS — K50919 Crohn's disease, unspecified, with unspecified complications: Secondary | ICD-10-CM

## 2020-10-07 ENCOUNTER — Other Ambulatory Visit: Payer: Medicare Other

## 2020-10-07 ENCOUNTER — Other Ambulatory Visit: Payer: Self-pay

## 2020-10-07 ENCOUNTER — Other Ambulatory Visit: Payer: Self-pay | Admitting: Interventional Cardiology

## 2020-10-07 DIAGNOSIS — K50919 Crohn's disease, unspecified, with unspecified complications: Secondary | ICD-10-CM

## 2020-10-08 ENCOUNTER — Other Ambulatory Visit: Payer: Self-pay

## 2020-10-08 LAB — CMP14+EGFR
ALT: 20 IU/L (ref 0–44)
AST: 23 IU/L (ref 0–40)
Albumin/Globulin Ratio: 1.4 (ref 1.2–2.2)
Albumin: 3.9 g/dL (ref 3.8–4.8)
Alkaline Phosphatase: 73 IU/L (ref 44–121)
BUN/Creatinine Ratio: 8 — ABNORMAL LOW (ref 10–24)
BUN: 8 mg/dL (ref 8–27)
Bilirubin Total: 0.2 mg/dL (ref 0.0–1.2)
CO2: 23 mmol/L (ref 20–29)
Calcium: 9 mg/dL (ref 8.6–10.2)
Chloride: 102 mmol/L (ref 96–106)
Creatinine, Ser: 1.03 mg/dL (ref 0.76–1.27)
Globulin, Total: 2.8 g/dL (ref 1.5–4.5)
Glucose: 106 mg/dL — ABNORMAL HIGH (ref 65–99)
Potassium: 4.2 mmol/L (ref 3.5–5.2)
Sodium: 141 mmol/L (ref 134–144)
Total Protein: 6.7 g/dL (ref 6.0–8.5)
eGFR: 80 mL/min/{1.73_m2} (ref >59)

## 2020-10-08 LAB — CBC WITH DIFFERENTIAL/PLATELET
Basophils Absolute: 0 10*3/uL (ref 0.0–0.2)
Basos: 0 %
EOS (ABSOLUTE): 0.1 10*3/uL (ref 0.0–0.4)
Eos: 1 %
Hematocrit: 37.8 % (ref 37.5–51.0)
Hemoglobin: 13.1 g/dL (ref 13.0–17.7)
Immature Grans (Abs): 0 10*3/uL (ref 0.0–0.1)
Immature Granulocytes: 0 %
Lymphocytes Absolute: 1.5 10*3/uL (ref 0.7–3.1)
Lymphs: 21 %
MCH: 31.9 pg (ref 26.6–33.0)
MCHC: 34.7 g/dL (ref 31.5–35.7)
MCV: 92 fL (ref 79–97)
Monocytes Absolute: 0.5 10*3/uL (ref 0.1–0.9)
Monocytes: 7 %
Neutrophils Absolute: 5.3 10*3/uL (ref 1.4–7.0)
Neutrophils: 71 %
Platelets: 201 10*3/uL (ref 150–450)
RBC: 4.11 x10E6/uL — ABNORMAL LOW (ref 4.14–5.80)
RDW: 14.4 % (ref 11.6–15.4)
WBC: 7.5 10*3/uL (ref 3.4–10.8)

## 2020-10-08 MED ORDER — FUROSEMIDE 40 MG PO TABS: ORAL_TABLET | ORAL | 0 refills | Status: DC

## 2020-10-09 ENCOUNTER — Other Ambulatory Visit: Payer: Self-pay | Admitting: Interventional Cardiology

## 2020-10-11 ENCOUNTER — Other Ambulatory Visit: Payer: Self-pay

## 2020-10-11 ENCOUNTER — Other Ambulatory Visit: Payer: Self-pay | Admitting: *Deleted

## 2020-10-11 MED ORDER — ISOSORBIDE MONONITRATE ER 30 MG PO TB24: ORAL_TABLET | ORAL | 0 refills | Status: DC

## 2020-10-12 ENCOUNTER — Other Ambulatory Visit: Payer: Self-pay

## 2020-10-12 MED ORDER — ISOSORBIDE MONONITRATE ER 30 MG PO TB24: 30.0000 mg | ORAL_TABLET | Freq: Every day | ORAL | 0 refills | Status: DC

## 2020-10-20 ENCOUNTER — Encounter: Payer: Self-pay | Admitting: Nurse Practitioner

## 2020-10-25 DIAGNOSIS — R350 Frequency of micturition: Secondary | ICD-10-CM | POA: Diagnosis not present

## 2020-10-25 DIAGNOSIS — R3914 Feeling of incomplete bladder emptying: Secondary | ICD-10-CM | POA: Diagnosis not present

## 2020-10-28 ENCOUNTER — Other Ambulatory Visit: Payer: Self-pay | Admitting: Interventional Cardiology

## 2020-10-28 ENCOUNTER — Other Ambulatory Visit: Payer: Self-pay

## 2020-10-28 MED ORDER — CLOPIDOGREL BISULFATE 75 MG PO TABS: 75.0000 mg | ORAL_TABLET | Freq: Every day | ORAL | 0 refills | Status: DC

## 2020-11-03 DIAGNOSIS — J449 Chronic obstructive pulmonary disease, unspecified: Secondary | ICD-10-CM | POA: Diagnosis not present

## 2020-11-05 ENCOUNTER — Ambulatory Visit: Payer: Medicare Other | Admitting: Interventional Cardiology

## 2020-11-08 ENCOUNTER — Encounter: Payer: Self-pay | Admitting: Nurse Practitioner

## 2020-11-08 ENCOUNTER — Other Ambulatory Visit: Payer: Self-pay | Admitting: Interventional Cardiology

## 2020-11-08 ENCOUNTER — Ambulatory Visit (INDEPENDENT_AMBULATORY_CARE_PROVIDER_SITE_OTHER): Payer: Medicare Other | Admitting: Nurse Practitioner

## 2020-11-08 ENCOUNTER — Other Ambulatory Visit: Payer: Self-pay

## 2020-11-08 VITALS — BP 130/70 | HR 87 | Temp 97.8°F | Ht 71.0 in | Wt 222.4 lb

## 2020-11-08 DIAGNOSIS — K5090 Crohn's disease, unspecified, without complications: Secondary | ICD-10-CM | POA: Diagnosis not present

## 2020-11-08 DIAGNOSIS — I25119 Atherosclerotic heart disease of native coronary artery with unspecified angina pectoris: Secondary | ICD-10-CM | POA: Diagnosis not present

## 2020-11-08 DIAGNOSIS — I5032 Chronic diastolic (congestive) heart failure: Secondary | ICD-10-CM

## 2020-11-08 DIAGNOSIS — I739 Peripheral vascular disease, unspecified: Secondary | ICD-10-CM | POA: Diagnosis not present

## 2020-11-08 DIAGNOSIS — R7303 Prediabetes: Secondary | ICD-10-CM | POA: Diagnosis not present

## 2020-11-08 DIAGNOSIS — I110 Hypertensive heart disease with heart failure: Secondary | ICD-10-CM

## 2020-11-08 DIAGNOSIS — I10 Essential (primary) hypertension: Secondary | ICD-10-CM

## 2020-11-08 DIAGNOSIS — I700 Atherosclerosis of aorta: Secondary | ICD-10-CM

## 2020-11-08 HISTORY — DX: Chronic diastolic (congestive) heart failure: I50.32

## 2020-11-08 NOTE — Progress Notes (Signed)
I,Chad Moss as a Education administrator for [employer], FNP.,have documented all relevant documentation on the behalf of Chad Brine, FNP,as directed by  Chad Brine, FNP while in the presence of Chad Moss, Chad Moss. This visit occurred during the SARS-CoV-2 public health emergency.  Safety protocols were in place, including screening questions prior to the visit, additional usage of staff PPE, and extensive cleaning of exam room while observing appropriate contact time as indicated for disinfecting solutions.  Subjective:     Patient ID: Chad Moss , male    DOB: 06-15-1956 , 65 y.o.   MRN: [id number]   Chief Complaint  Patient presents with   Prediabetes   Hypertension    HPI  Patient presents today for a f/u on his prediabetes and blood pressure. His wife stated he has been eating more sweets and she is concerned he might be a diabetic now because he was already borderline when he was last seen.   Wt Readings from Last 3 Encounters: 11/08/20 : 222 lb 6.4 oz (100.9 kg) 06/09/20 : 233 lb 6.4 oz (105.9 kg) 03/27/20 : 235 lb (106.6 kg)  Denies changes in his diet and has not increased his physical activity.   He was scheduled to see Dr Irish Lack last Friday but had GI upset so he missed the appt.   Next appt with GI at [facility] in July  Ophthalmology appt Aug 5th.   Hypertension This is a chronic problem. The current episode started more than 1 year ago. The problem has been waxing and waning since onset. The problem is controlled. Pertinent negatives include no anxiety, chest pain, headaches or palpitations. There are no associated agents to hypertension. Risk factors for coronary artery disease include obesity and sedentary lifestyle. Past treatments include calcium channel blockers and diuretics. There are no compliance problems.  There is no history of angina. There is no history of chronic renal disease.  Diabetes He presents for his follow-up diabetic visit. Diabetes  type: prediabetes. There are no hypoglycemic associated symptoms. Pertinent negatives for hypoglycemia include no dizziness or headaches. There are no diabetic associated symptoms. Pertinent negatives for diabetes include no chest pain and no fatigue. There are no hypoglycemic complications. Symptoms are stable. There are no diabetic complications. Risk factors for coronary artery disease include male sex and sedentary lifestyle. Current diabetic treatment includes oral agent (monotherapy). He is compliant with treatment most of the time. His weight is stable. When asked about meal planning, he reported none. He rarely participates in exercise. (Not checking regularly) He does not see a podiatrist.Eye exam is current.    Past Medical History:  Diagnosis Date   Allergic rhinitis    Anxiety    Atelectasis    CAD (coronary artery disease), native coronary artery    9/18 PCI/DES x1 to mLCx, mild diffuse nonobstructive disease, EF 55% on Lv gram   Chronic bronchitis (HCC)    COPD (chronic obstructive pulmonary disease) (HCC)    Crohn's disease (HCC)    Depression    DVT (deep venous thrombosis) (HCC)    RLE X 2   Dysphagia    Dyspnea    Enlarged prostate    GERD (gastroesophageal reflux disease)    Glaucoma, both eyes    Heart murmur    History of stomach ulcers 1980s   Hyperlipidemia    Hypertension    "off RX for years now cause of coughing w/Lisinopril" (03/13/2017)   IBS (irritable bowel syndrome)    Paranoid schizophrenia ([place])  Peripheral vascular disease ([place])    Pneumonia 1990s   Pre-diabetes    "one time" (03/13/2017)   Stroke ([place]) 04/2016   "eye stroke; left eye" (03/13/2017)   Syncopal episodes      Family History  Problem Relation Age of Onset   Hypertension Mother    Heart disease Mother    Hypertension Father    Heart disease Father    Lung cancer Brother        2006     Current Outpatient Medications:    albuterol (PROVENTIL) (2.5 MG/3ML) 0.083% nebulizer  solution, Take 3 mLs (2.5 mg total) by nebulization every 6 (six) hours as needed for wheezing or shortness of breath., Disp: 300 mL, Rfl: 5   albuterol (VENTOLIN HFA) 108 (90 Base) MCG/ACT inhaler, INHALE 2 PUFFS BY MOUTH INTO THE LUNGS EVERY 6 HOURS AS NEEDED WHEEZING OR SHORTNESS OF BREATH, Disp: 25.5 g, Rfl: 3   alfuzosin (UROXATRAL) 10 MG 24 hr tablet, Take 10 mg by mouth at bedtime., Disp: , Rfl:    aspirin EC 81 MG tablet, Take 81 mg by mouth daily., Disp: , Rfl:    bimatoprost (LUMIGAN) 0.01 % SOLN, Place 1 drop into the left eye at bedtime., Disp: , Rfl:    brimonidine (ALPHAGAN) 0.15 % ophthalmic solution, Place 1 drop into the left eye 2 (two) times daily., Disp: , Rfl:    cholestyramine (QUESTRAN) 4 G packet, Take 1 packet by mouth daily., Disp: , Rfl:    clonazePAM (KLONOPIN) 1 MG tablet, Take 1 mg by mouth at bedtime. Anxiety, Disp: , Rfl:    dicyclomine (BENTYL) 10 MG capsule, Take 10 mg by mouth 4 (four) times daily -  before meals and at bedtime., Disp: , Rfl:    diphenhydrAMINE (BENADRYL) 25 MG tablet, Take 50 mg by mouth at bedtime. , Disp: , Rfl:    finasteride (PROSCAR) 5 MG tablet, Take 5 mg by mouth daily., Disp: , Rfl:    fluticasone (FLONASE) 50 MCG/ACT nasal spray, SHAKE LIQUID AND USE 1 SPRAY IN EACH NOSTRIL DAILY, Disp: 16 g, Rfl: 2   loxapine (LOXITANE) 10 MG capsule, Take 10 mg by mouth 2 (two) times daily. 1 tab in AM and 2 tabs in PM, Disp: , Rfl:    mercaptopurine (PURINETHOL) 50 MG tablet, Take 75 mg by mouth daily. 1 tablet and a half tablet Give on an empty stomach 1 hour before or 2 hours after meals. Caution: Chemotherapy., Disp: , Rfl:    mesalamine (PENTASA) 250 MG CR capsule, Take 1,000 mg by mouth 4 (four) times daily. , Disp: , Rfl:    mirtazapine (REMERON) 30 MG tablet, Take 45 mg by mouth at bedtime. 1.5 tablets by mouth daily, Disp: , Rfl:    phenol (CHLORASEPTIC GARGLE) 1.4 % LIQD, Use as directed 1 spray in the mouth or throat as needed for throat  irritation / pain., Disp: 118 mL, Rfl: 0   SPIRIVA HANDIHALER 18 MCG inhalation capsule, INHALE THE CONTENT OF 1 CAPSULE VIA HANDIHALER DAILY, Disp: 30 capsule, Rfl: 0   tamsulosin (FLOMAX) 0.4 MG CAPS capsule, Take 0.4 mg by mouth at bedtime., Disp: , Rfl:    timolol (TIMOPTIC) 0.5 % ophthalmic solution, Place 1 drop into the left eye 2 (two) times daily., Disp: , Rfl:    traZODone (DESYREL) 50 MG tablet, Take 150 mg by mouth at bedtime., Disp: , Rfl:    amLODipine (NORVASC) 5 MG tablet, TAKE 1 TABLET(5 MG) BY MOUTH DAILY, Disp:  90 tablet, Rfl: 3   clopidogrel (PLAVIX) 75 MG tablet, Take 1 tablet (75 mg total) by mouth daily., Disp: 90 tablet, Rfl: 3   doxycycline (VIBRA-TABS) 100 MG tablet, Take 1 tablet (100 mg total) by mouth 2 (two) times daily., Disp: 10 tablet, Rfl: 0   furosemide (LASIX) 40 MG tablet, Take 1 tablet 4 times a week., Disp: 55 tablet, Rfl: 3   isosorbide mononitrate (IMDUR) 30 MG 24 hr tablet, Take 1 tablet (30 mg total) by mouth daily., Disp: 90 tablet, Rfl: 3   loratadine (CLARITIN) 10 MG tablet, TAKE 1 TABLET(10 MG) BY MOUTH DAILY, Disp: 30 tablet, Rfl: 2   nitroGLYCERIN (NITROSTAT) 0.4 MG SL tablet, Place 1 tablet (0.4 mg total) under the tongue every 5 (five) minutes x 3 doses as needed for chest pain., Disp: 25 tablet, Rfl: 6   pantoprazole (PROTONIX) 40 MG tablet, TAKE 1 TABLET(40 MG) BY MOUTH TWICE DAILY., Disp: 180 tablet, Rfl: 3   potassium chloride (KLOR-CON) 10 MEQ tablet, Take 1 tablet (10 mEq total) by mouth 2 (two) times daily., Disp: 180 tablet, Rfl: 3   rosuvastatin (CRESTOR) 20 MG tablet, Take 1 tablet (20 mg total) by mouth daily., Disp: 90 tablet, Rfl: 3   Allergies  Allergen Reactions   Benztropine Anaphylaxis   Codeine Anaphylaxis   Meperidine Swelling   Cyclobenzaprine Other (See Comments)    Pt. Does not remember   Penicillins Rash    Has patient had a PCN reaction causing immediate rash, facial/tongue/throat swelling, SOB or lightheadedness with  hypotension: Yes Has patient had a PCN reaction causing severe rash involving mucus membranes or skin necrosis: No Has patient had a PCN reaction that required hospitalization No Has patient had a PCN reaction occurring within the last 10 years: No If all of the above answers are "NO", then may proceed with Cephalosporin use.    Sulfamethoxazole-Trimethoprim     REACTION: hives   Sulfonamide Derivatives     REACTION: hives     Review of Systems  Constitutional: Negative.  Negative for fatigue.  HENT:  Negative for congestion, ear pain, hearing loss, rhinorrhea, sinus pressure, sinus pain and sore throat.   Respiratory: Negative.  Negative for cough.   Cardiovascular: Negative.  Negative for chest pain, palpitations and leg swelling.  Gastrointestinal:  Negative for abdominal pain and vomiting.  Skin:  Negative for rash.  Neurological:  Negative for dizziness and headaches.  Psychiatric/Behavioral: Negative.      Today's Vitals   11/08/20 1428  BP: 130/70  Pulse: 87  Temp: 97.8 F (36.6 C)  TempSrc: Oral  Weight: 222 lb 6.4 oz (100.9 kg)  Height: 5' 11"  (1.803 m)  PainSc: 0-No pain   Body mass index is 31.02 kg/m.   Objective:  Physical Exam Vitals reviewed.  Constitutional:      General: He is not in acute distress.    Appearance: Normal appearance.  Cardiovascular:     Rate and Rhythm: Normal rate and regular rhythm.     Pulses: Normal pulses.     Heart sounds: Normal heart sounds. No murmur heard. Pulmonary:     Effort: Pulmonary effort is normal. No respiratory distress.     Breath sounds: Normal breath sounds. No wheezing.  Skin:    General: Skin is warm and dry.     Capillary Refill: Capillary refill takes less than 2 seconds.     Coloration: Skin is not jaundiced.     Findings: No bruising.  Neurological:  General: No focal deficit present.     Mental Status: He is alert and oriented to person, place, and time.     Cranial Nerves: No cranial nerve  deficit.  Psychiatric:        Mood and Affect: Mood normal.        Behavior: Behavior normal.        Thought Content: Thought content normal.        Judgment: Judgment normal.        Assessment And Plan:     1. Essential hypertension Chronic, good control  2. Prediabetes Chronic, no current medications - Hemoglobin A1c  3. Chronic diastolic heart failure (HCC) Stable, no current issues  4. Crohn's disease without complication, unspecified gastrointestinal tract location [facility]) Continue with follow up with [facility] Discussed his labs during office visit - CMP14+EGFR; Future - CBC with Differential/Platelet; Future  5. Peripheral vascular disease, unspecified (HCC) Chronic, stable  6. Coronary artery disease involving native coronary artery of native heart with angina pectoris ([place]) Chronic, continue follow up with Cardiology  7. Aortic atherosclerosis (HCC) Chronic, continue current medications, tolerating well - Lipid panel     Patient was given opportunity to ask questions. Patient verbalized understanding of the plan and was able to repeat key elements of the plan. All questions were answered to their satisfaction.  Chad Brine, FNP   I, Chad Brine, FNP, have reviewed all documentation for this visit. The documentation on 11/08/20 for the exam, diagnosis, procedures, and orders are all accurate and complete.   IF YOU HAVE BEEN REFERRED TO A SPECIALIST, IT MAY TAKE 1-2 WEEKS TO SCHEDULE/PROCESS THE REFERRAL. IF YOU HAVE NOT HEARD FROM US/SPECIALIST IN TWO WEEKS, PLEASE GIVE [place] A CALL AT [phone].   THE PATIENT IS ENCOURAGED TO PRACTICE SOCIAL DISTANCING DUE TO THE COVID-19 PANDEMIC.

## 2020-11-09 ENCOUNTER — Encounter: Payer: Self-pay | Admitting: Interventional Cardiology

## 2020-11-09 ENCOUNTER — Ambulatory Visit (INDEPENDENT_AMBULATORY_CARE_PROVIDER_SITE_OTHER): Payer: Medicare Other | Admitting: Interventional Cardiology

## 2020-11-09 VITALS — BP 146/78 | HR 77 | Ht 71.0 in | Wt 225.2 lb

## 2020-11-09 DIAGNOSIS — E782 Mixed hyperlipidemia: Secondary | ICD-10-CM | POA: Diagnosis not present

## 2020-11-09 DIAGNOSIS — I10 Essential (primary) hypertension: Secondary | ICD-10-CM | POA: Diagnosis not present

## 2020-11-09 DIAGNOSIS — I25119 Atherosclerotic heart disease of native coronary artery with unspecified angina pectoris: Secondary | ICD-10-CM | POA: Diagnosis not present

## 2020-11-09 DIAGNOSIS — I5032 Chronic diastolic (congestive) heart failure: Secondary | ICD-10-CM

## 2020-11-09 LAB — LIPID PANEL
Chol/HDL Ratio: 3.5 ratio (ref 0.0–5.0)
Cholesterol, Total: 120 mg/dL (ref 100–199)
HDL: 34 mg/dL — ABNORMAL LOW (ref >39)
LDL Chol Calc (NIH): 60 mg/dL (ref 0–99)
Triglycerides: 147 mg/dL (ref 0–149)
VLDL Cholesterol Cal: 26 mg/dL (ref 5–40)

## 2020-11-09 LAB — HEMOGLOBIN A1C
Est. average glucose Bld gHb Est-mCnc: 123 mg/dL
Hgb A1c MFr Bld: 5.9 % — ABNORMAL HIGH (ref 4.8–5.6)

## 2020-11-09 MED ORDER — CLOPIDOGREL BISULFATE 75 MG PO TABS: 75.0000 mg | ORAL_TABLET | Freq: Every day | ORAL | 3 refills | Status: DC

## 2020-11-09 MED ORDER — NITROGLYCERIN 0.4 MG SL SUBL: 0.4000 mg | SUBLINGUAL_TABLET | SUBLINGUAL | 6 refills | Status: DC | PRN

## 2020-11-09 MED ORDER — ISOSORBIDE MONONITRATE ER 30 MG PO TB24: 30.0000 mg | ORAL_TABLET | Freq: Every day | ORAL | 3 refills | Status: DC

## 2020-11-09 MED ORDER — POTASSIUM CHLORIDE ER 10 MEQ PO TBCR: 10.0000 meq | EXTENDED_RELEASE_TABLET | Freq: Two times a day (BID) | ORAL | 3 refills | Status: DC

## 2020-11-09 MED ORDER — FUROSEMIDE 40 MG PO TABS: ORAL_TABLET | ORAL | 3 refills | Status: DC

## 2020-11-09 MED ORDER — PANTOPRAZOLE SODIUM 40 MG PO TBEC: DELAYED_RELEASE_TABLET | ORAL | 3 refills | Status: DC

## 2020-11-09 MED ORDER — ROSUVASTATIN CALCIUM 20 MG PO TABS: 20.0000 mg | ORAL_TABLET | Freq: Every day | ORAL | 3 refills | Status: DC

## 2020-11-09 MED ORDER — AMLODIPINE BESYLATE 5 MG PO TABS: 5.0000 mg | ORAL_TABLET | Freq: Every day | ORAL | 3 refills | Status: DC

## 2020-11-09 NOTE — Patient Instructions (Signed)
Medication Instructions:  Your physician recommends that you continue on your current medications as directed. Please refer to the Current Medication list given to you today. *If you need a refill on your cardiac medications before your next appointment, please call your pharmacy*   Lab Work: none If you have labs (blood work) drawn today and your tests are completely normal, you will receive your results only by: Marland Kitchen MyChart Message (if you have MyChart) OR . A paper copy in the mail If you have any lab test that is abnormal or we need to change your treatment, we will call you to review the results.   Testing/Procedures: none   Follow-Up: At [facility], you and your health needs are our priority.  As part of our continuing mission to provide you with exceptional heart care, we have created designated Provider Care Teams.  These Care Teams include your primary Cardiologist (physician) and Advanced Practice Providers (APPs -  Physician Assistants and Nurse Practitioners) who all work together to provide you with the care you need, when you need it.  We recommend signing up for the patient portal called "MyChart".  Sign up information is provided on this After Visit Summary.  MyChart is used to connect with patients for Virtual Visits (Telemedicine).  Patients are able to view lab/test results, encounter notes, upcoming appointments, etc.  Non-urgent messages can be sent to your provider as well.   To learn more about what you can do with MyChart, go to [email or web address].    Your next appointment:   12 month(s)  The format for your next appointment:   In Person  Provider:   You may see Larae Grooms, MD or one of the following Advanced Practice Providers on your designated Care Team:    Melina Copa, PA-C  Ermalinda Barrios, PA-C    Other Instructions  Steps to Quit Smoking Smoking tobacco is the leading cause of preventable death. It can affect almost every organ in the  body. Smoking puts you and people around you at risk for many serious, long-lasting (chronic) diseases. Quitting smoking can be hard, but it is one of the best things that you can do for your health. It is never too late to quit. How do I get ready to quit? When you decide to quit smoking, make a plan to help you succeed. Before you quit:  Pick a date to quit. Set a date within the next 2 weeks to give you time to prepare.  Write down the reasons why you are quitting. Keep this list in places where you will see it often.  Tell your family, friends, and co-workers that you are quitting. Their support is important.  Talk with your doctor about the choices that may help you quit.  Find out if your health insurance will pay for these treatments.  Know the people, places, things, and activities that make you want to smoke (triggers). Avoid them. What first steps can I take to quit smoking?  Throw away all cigarettes at home, at work, and in your car.  Throw away the things that you use when you smoke, such as ashtrays and lighters.  Clean your car. Make sure to empty the ashtray.  Clean your home, including curtains and carpets. What can I do to help me quit smoking? Talk with your doctor about taking medicines and seeing a counselor at the same time. You are more likely to succeed when you do both.  If you are pregnant or breastfeeding, talk  with your doctor about counseling or other ways to quit smoking. Do not take medicine to help you quit smoking unless your doctor tells you to do so. To quit smoking: Quit right away  Quit smoking totally, instead of slowly cutting back on how much you smoke over a period of time.  Go to counseling. You are more likely to quit if you go to counseling sessions regularly. Take medicine You may take medicines to help you quit. Some medicines need a prescription, and some you can buy over-the-counter. Some medicines may contain a drug called nicotine  to replace the nicotine in cigarettes. Medicines may:  Help you to stop having the desire to smoke (cravings).  Help to stop the problems that come when you stop smoking (withdrawal symptoms). Your doctor may ask you to use:  Nicotine patches, gum, or lozenges.  Nicotine inhalers or sprays.  Non-nicotine medicine that is taken by mouth. Find resources Find resources and other ways to help you quit smoking and remain smoke-free after you quit. These resources are most helpful when you use them often. They include:  Online chats with a Social worker.  Phone quitlines.  Printed Furniture conservator/restorer.  Support groups or group counseling.  Text messaging programs.  Mobile phone apps. Use apps on your mobile phone or tablet that can help you stick to your quit plan. There are many free apps for mobile phones and tablets as well as websites. Examples include Quit Guide from the [employer] and smokefree.gov   What things can I do to make it easier to quit?  Talk to your family and friends. Ask them to support and encourage you.  Call a phone quitline (1-800-QUIT-NOW), reach out to support groups, or work with a Social worker.  Ask people who smoke to not smoke around you.  Avoid places that make you want to smoke, such as: ? Bars. ? Parties. ? Smoke-break areas at work.  Spend time with people who do not smoke.  Lower the stress in your life. Stress can make you want to smoke. Try these things to help your stress: ? Getting regular exercise. ? Doing deep-breathing exercises. ? Doing yoga. ? Meditating. ? Doing a body scan. To do this, close your eyes, focus on one area of your body at a time from head to toe. Notice which parts of your body are tense. Try to relax the muscles in those areas.   How will I feel when I quit smoking? Day 1 to 3 weeks Within the first 24 hours, you may start to have some problems that come from quitting tobacco. These problems are very bad 2-3 days after you quit,  but they do not often last for more than 2-3 weeks. You may get these symptoms:  Mood swings.  Feeling restless, nervous, angry, or annoyed.  Trouble concentrating.  Dizziness.  Strong desire for high-sugar foods and nicotine.  Weight gain.  Trouble pooping (constipation).  Feeling like you may vomit (nausea).  Coughing or a sore throat.  Changes in how the medicines that you take for other issues work in your body.  Depression.  Trouble sleeping (insomnia). Week 3 and afterward After the first 2-3 weeks of quitting, you may start to notice more positive results, such as:  Better sense of smell and taste.  Less coughing and sore throat.  Slower heart rate.  Lower blood pressure.  Clearer skin.  Better breathing.  Fewer sick days. Quitting smoking can be hard. Do not give up if you  fail the first time. Some people need to try a few times before they succeed. Do your best to stick to your quit plan, and talk with your doctor if you have any questions or concerns. Summary  Smoking tobacco is the leading cause of preventable death. Quitting smoking can be hard, but it is one of the best things that you can do for your health.  When you decide to quit smoking, make a plan to help you succeed.  Quit smoking right away, not slowly over a period of time.  When you start quitting, seek help from your doctor, family, or friends. This information is not intended to replace advice given to you by your health care provider. Make sure you discuss any questions you have with your health care provider. Document Revised: 02/28/2019 Document Reviewed: 08/24/2018 Elsevier Patient Education  2021 [place].   High-Fiber Eating Plan Fiber, also called dietary fiber, is a type of carbohydrate. It is found foods such as fruits, vegetables, whole grains, and beans. A high-fiber diet can have many health benefits. Your health care provider may recommend a high-fiber diet to  help:  Prevent constipation. Fiber can make your bowel movements more regular.  Lower your cholesterol.  Relieve the following conditions: ? Inflammation of veins in the anus (hemorrhoids). ? Inflammation of specific areas of the digestive tract (uncomplicated diverticulosis). ? A problem of the large intestine, also called the colon, that sometimes causes pain and diarrhea (irritable bowel syndrome, or IBS).  Prevent overeating as part of a weight-loss plan.  Prevent heart disease, type 2 diabetes, and certain cancers. What are tips for following this plan? Reading food labels  Check the nutrition facts label on food products for the amount of dietary fiber. Choose foods that have 5 grams of fiber or more per serving.  The goals for recommended daily fiber intake include: ? Men (age 2 or younger): 34-38 g. ? Men (over age 73): 28-34 g. ? Women (age 61 or younger): 25-28 g. ? Women (over age 84): 22-25 g. Your daily fiber goal is _____________ g.   Shopping  Choose whole fruits and vegetables instead of processed forms, such as apple juice or applesauce.  Choose a wide variety of high-fiber foods such as avocados, lentils, oats, and kidney beans.  Read the nutrition facts label of the foods you choose. Be aware of foods with added fiber. These foods often have high sugar and sodium amounts per serving. Cooking  Use whole-grain flour for baking and cooking.  Cook with brown rice instead of white rice. Meal planning  Start the day with a breakfast that is high in fiber, such as a cereal that contains 5 g of fiber or more per serving.  Eat breads and cereals that are made with whole-grain flour instead of refined flour or white flour.  Eat brown rice, bulgur wheat, or millet instead of white rice.  Use beans in place of meat in soups, salads, and pasta dishes.  Be sure that half of the grains you eat each day are whole grains. General information  You can get the  recommended daily intake of dietary fiber by: ? Eating a variety of fruits, vegetables, grains, nuts, and beans. ? Taking a fiber supplement if you are not able to take in enough fiber in your diet. It is better to get fiber through food than from a supplement.  Gradually increase how much fiber you consume. If you increase your intake of dietary fiber too quickly,  you may have bloating, cramping, or gas.  Drink plenty of water to help you digest fiber.  Choose high-fiber snacks, such as berries, raw vegetables, nuts, and popcorn. What foods should I eat? Fruits Berries. Pears. Apples. Oranges. Avocado. Prunes and raisins. Dried figs. Vegetables Sweet potatoes. Spinach. Kale. Artichokes. Cabbage. Broccoli. Cauliflower. Green peas. Carrots. Squash. Grains Whole-grain breads. Multigrain cereal. Oats and oatmeal. Brown rice. Barley. Bulgur wheat. [place]. Quinoa. Bran muffins. Popcorn. Rye wafer crackers. Meats and other proteins Navy beans, kidney beans, and pinto beans. Soybeans. Split peas. Lentils. Nuts and seeds. Dairy Fiber-fortified yogurt. Beverages Fiber-fortified soy milk. Fiber-fortified orange juice. Other foods Fiber bars. The items listed above may not be a complete list of recommended foods and beverages. Contact a dietitian for more information. What foods should I avoid? Fruits Fruit juice. Cooked, strained fruit. Vegetables Fried potatoes. Canned vegetables. Well-cooked vegetables. Grains White bread. Pasta made with refined flour. White rice. Meats and other proteins Fatty cuts of meat. Fried chicken or fried fish. Dairy Milk. Yogurt. Cream cheese. Sour cream. Fats and oils Butters. Beverages Soft drinks. Other foods Cakes and pastries. The items listed above may not be a complete list of foods and beverages to avoid. Talk with your dietitian about what choices are best for you. Summary  Fiber is a type of carbohydrate. It is found in foods such as fruits,  vegetables, whole grains, and beans.  A high-fiber diet has many benefits. It can help to prevent constipation, lower blood cholesterol, aid weight loss, and reduce your risk of heart disease, diabetes, and certain cancers.  Increase your intake of fiber gradually. Increasing fiber too quickly may cause cramping, bloating, and gas. Drink plenty of water while you increase the amount of fiber you consume.  The best sources of fiber include whole fruits and vegetables, whole grains, nuts, seeds, and beans. This information is not intended to replace advice given to you by your health care provider. Make sure you discuss any questions you have with your health care provider. Document Revised: 10/09/2019 Document Reviewed: 10/09/2019 Elsevier Patient Education  2021 [employer].

## 2020-11-09 NOTE — Progress Notes (Signed)
Cardiology Office Note   Date:  11/09/2020   ID:  Tye, Vigo 05/26/1956, MRN [id number]  PCP:  [name], FNP    No chief complaint on file.  CAD  Wt Readings from Last 3 Encounters:  11/09/20 225 lb 3.2 oz (102.2 kg)  11/08/20 222 lb 6.4 oz (100.9 kg)  06/09/20 233 lb 6.4 oz (105.9 kg)       History of Present Illness: TYRESSE JAYSON is a 65 y.o. 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 Long h/o tobacco abuse.   Cath on 03/13/17 showed:   The left ventricular systolic function is normal.  LV end diastolic pressure is normal.  The left ventricular ejection fraction is 55-65% by visual estimate.  There is no aortic valve stenosis.  Mid Cx lesion, 75 %stenosed.  A STENT RESOLUTE ONYX [id number] drug eluting stent was successfully placed.  Post intervention, there is a 0% residual stenosis.  Unable to use right radial artery due to high bifurcation of radial and ulnar arteries.  He was advised to stop smoking,but has not been able to stop.  In 2019, he had gained weight and had leg swelling.  He tested positive for COVID 19 in June 2020. Lasix was decreased to 2x/week while he was not eating much. He stopped smoking while infected with COVID.   He recovered from COVID-19. His girlfriend , [name], had it as well.  Since the last visit, he has used NTG rarely.   No longer walking up stairs.  Typically, he walks for 20-30 minutes daily at his apartment complex.  No sx with this activity.  He is still smoking.       Past Medical History:  Diagnosis Date  . Allergic rhinitis   . Anxiety   . Atelectasis   . CAD (coronary artery disease), native coronary artery    9/18 PCI/DES x1 to mLCx, mild diffuse nonobstructive disease, EF 55% on Lv gram  . Chronic bronchitis ([place])   . COPD (chronic obstructive pulmonary disease) ([place])   . Crohn's disease ([place])   .  Depression   . DVT (deep venous thrombosis) ([facility])    RLE X 2  . Dysphagia   . Dyspnea   . Enlarged prostate   . GERD (gastroesophageal reflux disease)   . Glaucoma, both eyes   . Heart murmur   . History of stomach ulcers 1980s  . Hyperlipidemia   . Hypertension    "off RX for years now cause of coughing w/Lisinopril" (03/13/2017)  . IBS (irritable bowel syndrome)   . Paranoid schizophrenia ([place])   . Peripheral vascular disease ([place])   . Pneumonia 1990s  . Pre-diabetes    "one time" (03/13/2017)  . Stroke [facility]) 04/2016   "eye stroke; left eye" (03/13/2017)  . Syncopal episodes     Past Surgical History:  Procedure Laterality Date  . ABDOMINAL HERNIA REPAIR  1990s  . COLECTOMY  1985; ?1989; 1996   "part of my ileum removed"  . CORONARY ANGIOPLASTY WITH STENT PLACEMENT  03/13/2017  . CORONARY STENT INTERVENTION N/A 03/13/2017   Procedure: CORONARY STENT INTERVENTION;  Surgeon: Jettie Booze, MD;  Location: [facility];  Service: Cardiovascular;  Laterality: N/A;  . EYE SURGERY Right    "laser; for glaucoma" (03/13/2017)  . FASCIOTOMY Right   . HERNIA REPAIR    . LAPAROSCOPIC CHOLECYSTECTOMY    . LEFT HEART CATH AND CORONARY  ANGIOGRAPHY N/A 03/13/2017   Procedure: LEFT HEART CATH AND CORONARY ANGIOGRAPHY;  Surgeon: Jettie Booze, MD;  Location: [facility];  Service: Cardiovascular;  Laterality: N/A;  . PENILE PROSTHESIS IMPLANT  01/2011   Archie Endo 02/01/2011  . PERIPHERAL VASCULAR CATHETERIZATION Right 1999   "had blood clots in my leg; went in to open them up; dye went into leg; ended up having a fasiotomy"  . TONSILLECTOMY       Current Outpatient Medications  Medication Sig Dispense Refill  . albuterol (PROVENTIL) (2.5 MG/3ML) 0.083% nebulizer solution Take 3 mLs (2.5 mg total) by nebulization every 6 (six) hours as needed for wheezing or shortness of breath. 300 mL 5  . albuterol (VENTOLIN HFA) 108 (90 Base) MCG/ACT inhaler INHALE 2 PUFFS BY  MOUTH INTO THE LUNGS EVERY 6 HOURS AS NEEDED WHEEZING OR SHORTNESS OF BREATH 25.5 g 3  . alfuzosin (UROXATRAL) 10 MG 24 hr tablet Take 10 mg by mouth at bedtime.    Marland Kitchen amLODipine (NORVASC) 5 MG tablet TAKE 1 TABLET(5 MG) BY MOUTH DAILY 30 tablet 0  . aspirin EC 81 MG tablet Take 81 mg by mouth daily.    . bimatoprost (LUMIGAN) 0.01 % SOLN Place 1 drop into the left eye at bedtime.    . brimonidine (ALPHAGAN) 0.15 % ophthalmic solution Place 1 drop into the left eye 2 (two) times daily.    . cholestyramine (QUESTRAN) 4 G packet Take 1 packet by mouth daily.    . clonazePAM (KLONOPIN) 1 MG tablet Take 1 mg by mouth at bedtime. Anxiety    . clopidogrel (PLAVIX) 75 MG tablet Take 1 tablet (75 mg total) by mouth daily. Please keep upcoming appt with Dr. Irish Lack in May 2022 before anymore refills. Thank you 30 tablet 0  . dicyclomine (BENTYL) 10 MG capsule Take 10 mg by mouth 4 (four) times daily -  before meals and at bedtime.    . diphenhydrAMINE (BENADRYL) 25 MG tablet Take 50 mg by mouth at bedtime.     . finasteride (PROSCAR) 5 MG tablet Take 5 mg by mouth daily.    . fluticasone (FLONASE) 50 MCG/ACT nasal spray SHAKE LIQUID AND USE 1 SPRAY IN EACH NOSTRIL DAILY 16 g 2  . furosemide (LASIX) 40 MG tablet Take 1 tablet 4 times a week. Please keep upcoming appt in May 2022 with Dr. Irish Lack before anymore refills. Thank you 45 tablet 0  . isosorbide mononitrate (IMDUR) 30 MG 24 hr tablet Take 1 tablet (30 mg total) by mouth daily. PLEASE HAVE PT KEEP UPCOMING APPT 5/20 30 tablet 0  . loxapine (LOXITANE) 10 MG capsule Take 10 mg by mouth 2 (two) times daily. 1 tab in AM and 2 tabs in PM    . mercaptopurine (PURINETHOL) 50 MG tablet Take 75 mg by mouth daily. 1 tablet and a half tablet Give on an empty stomach 1 hour before or 2 hours after meals. Caution: Chemotherapy.    . mesalamine (PENTASA) 250 MG CR capsule Take 1,000 mg by mouth 4 (four) times daily.     . mirtazapine (REMERON) 30 MG tablet Take  45 mg by mouth at bedtime. 1.5 tablets by mouth daily    . nitroGLYCERIN (NITROSTAT) 0.4 MG SL tablet Place 1 tablet (0.4 mg total) under the tongue every 5 (five) minutes x 3 doses as needed for chest pain. 25 tablet 9  . pantoprazole (PROTONIX) 40 MG tablet TAKE 1 TABLET(40 MG) BY MOUTH TWICE DAILY. Please keep  upcoming appt in May 2022 with Dr. Irish Lack before anymore refills. Thank you 180 tablet 0  . phenol (CHLORASEPTIC GARGLE) 1.4 % LIQD Use as directed 1 spray in the mouth or throat as needed for throat irritation / pain. 118 mL 0  . potassium chloride (K-DUR) 10 MEQ tablet Take 1 tablet (10 mEq total) by mouth 2 (two) times daily. 180 tablet 3  . propranolol (INDERAL) 10 MG tablet Take by mouth.    . rosuvastatin (CRESTOR) 20 MG tablet TAKE 1 TABLET(20 MG) BY MOUTH DAILY 90 tablet 0  . SPIRIVA HANDIHALER 18 MCG inhalation capsule INHALE THE CONTENT OF 1 CAPSULE VIA HANDIHALER DAILY 30 capsule 0  . tamsulosin (FLOMAX) 0.4 MG CAPS capsule Take 0.4 mg by mouth at bedtime.    . timolol (TIMOPTIC) 0.5 % ophthalmic solution Place 1 drop into the left eye 2 (two) times daily.    . traZODone (DESYREL) 50 MG tablet Take 150 mg by mouth at bedtime.     No current facility-administered medications for this visit.    Allergies:   Benztropine, Codeine, Meperidine, Cyclobenzaprine, Penicillins, Sulfamethoxazole-trimethoprim, and Sulfonamide derivatives    Social History:  The patient  reports that he has been smoking cigarettes. He started smoking about 52 years ago. He has a 100.00 pack-year smoking history. He has never used smokeless tobacco. He reports that he does not drink alcohol and does not use drugs.   Family History:  The patient's family history includes Heart disease in his father and mother; Hypertension in his father and mother; Lung cancer in his brother.    ROS:  Please see the history of present illness.   Otherwise, review of systems are positive for difficulty quitting smoking.    All other systems are reviewed and negative.    PHYSICAL EXAM: VS:  BP (!) 146/78   Pulse 77   Ht 5' 11"  (1.803 m)   Wt 225 lb 3.2 oz (102.2 kg)   BMI 31.41 kg/m  , BMI Body mass index is 31.41 kg/m. GEN: Well nourished, well developed, in no acute distress  HEENT: normal  Neck: no JVD, carotid bruits, or masses Cardiac: RRR; no murmurs, rubs, or gallops,no edema  Respiratory:  clear to auscultation bilaterally, normal work of breathing GI: soft, nontender, nondistended, + BS MS: no deformity or atrophy  Skin: warm and dry, no rash Neuro:  Strength and sensation are intact Psych: euthymic mood, full affect   EKG:   The ekg ordered today demonstrates NSR, LAD, nonspecific ST changes   Recent Labs: 12/29/2019: B Natriuretic Peptide 7.7 10/07/2020: ALT 20; BUN 8; Creatinine, Ser 1.03; Hemoglobin 13.1; Platelets 201; Potassium 4.2; Sodium 141   Lipid Panel    Component Value Date/Time   CHOL 120 11/08/2020 1510   TRIG 147 11/08/2020 1510   HDL 34 (L) 11/08/2020 1510   CHOLHDL 3.5 11/08/2020 1510   LDLCALC 60 11/08/2020 1510     Other studies Reviewed: Additional studies/ records that were reviewed today with results demonstrating: labs reviewed.   ASSESSMENT AND PLAN:  1.   CAD: Medical therapy preventing significant angina.  Rare nitroglycerin use.  Continue aggressive secondary prevention. 2.   Tobacco abuse: We spoke about the importance of stopping smoking. 3.   LE edema: Elevate legs.  We also discussed compression stockings and diuretics.  He will just try leg elevation. 4.   Prediabetes: Whole food, plant-based diet.  Increase fiber intake.  Avoid processed foods.  We spoke at length about this.  A1c 5.9. 5.   Hyperlipidemia: Rosuvastatin for lipid lowering therapy.  Dietary changes as noted above.   Current medicines are reviewed at length with the patient today.  The patient concerns regarding his medicines were addressed.  The following changes have been  made:  No change  Labs/ tests ordered today include:  No orders of the defined types were placed in this encounter.   Recommend 150 minutes/week of aerobic exercise Low fat, low carb, high fiber diet recommended  Disposition:   FU in 1 year   Signed, Larae Grooms, MD  11/09/2020 3:51 PM    [facility] [address], [place], [place]  [address] Phone: [phone]; Fax: [phone]

## 2020-11-23 ENCOUNTER — Other Ambulatory Visit: Payer: Self-pay | Admitting: Interventional Cardiology

## 2020-11-27 ENCOUNTER — Other Ambulatory Visit: Payer: Self-pay | Admitting: Nurse Practitioner

## 2020-11-27 DIAGNOSIS — J029 Acute pharyngitis, unspecified: Secondary | ICD-10-CM

## 2020-12-01 ENCOUNTER — Telehealth: Payer: Self-pay | Admitting: Internal Medicine

## 2020-12-01 MED ORDER — PREDNISONE 10 MG PO TABS: ORAL_TABLET | ORAL | 0 refills | Status: AC

## 2020-12-01 MED ORDER — DOXYCYCLINE HYCLATE 100 MG PO TABS: 100.0000 mg | ORAL_TABLET | Freq: Two times a day (BID) | ORAL | 0 refills | Status: DC

## 2020-12-01 NOTE — Telephone Encounter (Signed)
Called and spoke with Mliss Sax letting her know the recs stated by MR and she verbalized understanding. Verified preferred pharmacy and sent both doxycycline and prednisone in for pt. Nothing further needed.

## 2020-12-01 NOTE — Telephone Encounter (Signed)
     I do not see an appointment for his screening CT scan of the chest without contrast   In terms of hemoptysis -Check COVID home antigen test.  If it is negative we should check a PCR test -Treat for COPD exacerbation with  - Take doxycycline 165m po twice daily x 5 days; take after meals and avoid sunlight - Please take prednisone 40 mg x1 day, then 30 mg x1 day, then 20 mg x1 day, then 10 mg x1 day, and then 5 mg x1 day and stop  He neds follow-up with nurse practitioner after the screening CT scan of the chest without contrast next month   Allergies  Allergen Reactions   Benztropine Anaphylaxis   Codeine Anaphylaxis   Meperidine Swelling   Cyclobenzaprine Other (See Comments)    Pt. Does not remember   Penicillins Rash    Has patient had a PCN reaction causing immediate rash, facial/tongue/throat swelling, SOB or lightheadedness with hypotension: Yes Has patient had a PCN reaction causing severe rash involving mucus membranes or skin necrosis: No Has patient had a PCN reaction that required hospitalization No Has patient had a PCN reaction occurring within the last 10 years: No If all of the above answers are "NO", then may proceed with Cephalosporin use.    Sulfamethoxazole-Trimethoprim     REACTION: hives   Sulfonamide Derivatives     REACTION: hives

## 2020-12-01 NOTE — Telephone Encounter (Signed)
Called and spoke with Mliss Sax per DPR who states that this morning the patient coughed up some blood. States that it was just a little bit but was not coughing it up before this morning. Patient has no other complaints at this time and is due for his Lung Cancer screening CT next month.   Dr. Chase Caller please advise

## 2020-12-17 DIAGNOSIS — J449 Chronic obstructive pulmonary disease, unspecified: Secondary | ICD-10-CM | POA: Diagnosis not present

## 2021-01-24 ENCOUNTER — Telehealth: Payer: Self-pay | Admitting: Nurse Practitioner

## 2021-01-24 NOTE — Telephone Encounter (Signed)
Left message asking patient to call me @ [phone]  Patient has AWV 06/15/21  I wanted to see if patient would schedule sooner  [facility] calendar year

## 2021-02-03 DIAGNOSIS — J449 Chronic obstructive pulmonary disease, unspecified: Secondary | ICD-10-CM | POA: Diagnosis not present

## 2021-02-24 DIAGNOSIS — I10 Essential (primary) hypertension: Secondary | ICD-10-CM | POA: Diagnosis not present

## 2021-02-24 DIAGNOSIS — K5090 Crohn's disease, unspecified, without complications: Secondary | ICD-10-CM | POA: Diagnosis not present

## 2021-03-06 DIAGNOSIS — J449 Chronic obstructive pulmonary disease, unspecified: Secondary | ICD-10-CM | POA: Diagnosis not present

## 2021-03-10 DIAGNOSIS — H5213 Myopia, bilateral: Secondary | ICD-10-CM | POA: Diagnosis not present

## 2021-04-05 DIAGNOSIS — J449 Chronic obstructive pulmonary disease, unspecified: Secondary | ICD-10-CM | POA: Diagnosis not present

## 2021-04-26 DIAGNOSIS — H5203 Hypermetropia, bilateral: Secondary | ICD-10-CM | POA: Diagnosis not present

## 2021-04-29 DIAGNOSIS — F5101 Primary insomnia: Secondary | ICD-10-CM | POA: Diagnosis not present

## 2021-04-29 DIAGNOSIS — G2571 Drug induced akathisia: Secondary | ICD-10-CM | POA: Diagnosis not present

## 2021-04-29 DIAGNOSIS — G2581 Restless legs syndrome: Secondary | ICD-10-CM | POA: Diagnosis not present

## 2021-04-29 DIAGNOSIS — Z79899 Other long term (current) drug therapy: Secondary | ICD-10-CM | POA: Diagnosis not present

## 2021-05-03 ENCOUNTER — Emergency Department (HOSPITAL_BASED_OUTPATIENT_CLINIC_OR_DEPARTMENT_OTHER)
Admission: EM | Admit: 2021-05-03 | Discharge: 2021-05-04 | Disposition: A | Discharge status: Home / Self Care | Payer: Medicare Other | Attending: Emergency Medicine | Admitting: Emergency Medicine

## 2021-05-03 ENCOUNTER — Encounter (HOSPITAL_BASED_OUTPATIENT_CLINIC_OR_DEPARTMENT_OTHER): Payer: Self-pay | Admitting: Emergency Medicine

## 2021-05-03 ENCOUNTER — Other Ambulatory Visit: Payer: Self-pay

## 2021-05-03 DIAGNOSIS — Z7902 Long term (current) use of antithrombotics/antiplatelets: Secondary | ICD-10-CM | POA: Insufficient documentation

## 2021-05-03 DIAGNOSIS — Z79899 Other long term (current) drug therapy: Secondary | ICD-10-CM | POA: Insufficient documentation

## 2021-05-03 DIAGNOSIS — S199XXA Unspecified injury of neck, initial encounter: Secondary | ICD-10-CM | POA: Diagnosis not present

## 2021-05-03 DIAGNOSIS — M5030 Other cervical disc degeneration, unspecified cervical region: Secondary | ICD-10-CM | POA: Diagnosis not present

## 2021-05-03 DIAGNOSIS — Z87891 Personal history of nicotine dependence: Secondary | ICD-10-CM | POA: Diagnosis not present

## 2021-05-03 DIAGNOSIS — Z7982 Long term (current) use of aspirin: Secondary | ICD-10-CM | POA: Insufficient documentation

## 2021-05-03 DIAGNOSIS — I2510 Atherosclerotic heart disease of native coronary artery without angina pectoris: Secondary | ICD-10-CM | POA: Diagnosis not present

## 2021-05-03 DIAGNOSIS — Q318 Other congenital malformations of larynx: Secondary | ICD-10-CM

## 2021-05-03 DIAGNOSIS — Y92410 Unspecified street and highway as the place of occurrence of the external cause: Secondary | ICD-10-CM | POA: Diagnosis not present

## 2021-05-03 DIAGNOSIS — Z8616 Personal history of COVID-19: Secondary | ICD-10-CM | POA: Diagnosis not present

## 2021-05-03 DIAGNOSIS — R52 Pain, unspecified: Secondary | ICD-10-CM

## 2021-05-03 DIAGNOSIS — M542 Cervicalgia: Secondary | ICD-10-CM | POA: Diagnosis present

## 2021-05-03 DIAGNOSIS — I10 Essential (primary) hypertension: Secondary | ICD-10-CM | POA: Diagnosis not present

## 2021-05-03 DIAGNOSIS — Z7952 Long term (current) use of systemic steroids: Secondary | ICD-10-CM | POA: Diagnosis not present

## 2021-05-03 DIAGNOSIS — J449 Chronic obstructive pulmonary disease, unspecified: Secondary | ICD-10-CM | POA: Insufficient documentation

## 2021-05-03 DIAGNOSIS — Z041 Encounter for examination and observation following transport accident: Secondary | ICD-10-CM | POA: Diagnosis not present
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
## 2021-05-03 NOTE — ED Notes (Signed)
Pt ambulatory with steady gait to and from bathroom.

## 2021-05-04 ENCOUNTER — Emergency Department (HOSPITAL_BASED_OUTPATIENT_CLINIC_OR_DEPARTMENT_OTHER): Payer: Medicare Other

## 2021-05-04 ENCOUNTER — Encounter (HOSPITAL_BASED_OUTPATIENT_CLINIC_OR_DEPARTMENT_OTHER): Payer: Self-pay | Admitting: Emergency Medicine

## 2021-05-04 ENCOUNTER — Telehealth: Payer: Self-pay

## 2021-05-04 DIAGNOSIS — S199XXA Unspecified injury of neck, initial encounter: Secondary | ICD-10-CM | POA: Diagnosis not present

## 2021-05-04 DIAGNOSIS — M5030 Other cervical disc degeneration, unspecified cervical region: Secondary | ICD-10-CM | POA: Diagnosis not present

## 2021-05-04 DIAGNOSIS — Z041 Encounter for examination and observation following transport accident: Secondary | ICD-10-CM | POA: Diagnosis not present
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
## 2021-05-04 MED ORDER — LIDOCAINE 5 % EX PTCH: 1.0000 | MEDICATED_PATCH | CUTANEOUS | 0 refills | Status: DC

## 2021-05-04 MED ORDER — NAPROXEN 250 MG PO TABS
500.0000 mg | ORAL_TABLET | Freq: Once | ORAL | Status: AC
  Administered 2021-05-04: 500 mg via ORAL
  Filled 2021-05-04: qty 2

## 2021-05-04 MED ORDER — METHOCARBAMOL 500 MG PO TABS: 500.0000 mg | ORAL_TABLET | Freq: Two times a day (BID) | ORAL | 0 refills | Status: AC

## 2021-05-04 MED ORDER — ACETAMINOPHEN 500 MG PO TABS
1000.0000 mg | ORAL_TABLET | Freq: Once | ORAL | Status: AC
  Administered 2021-05-04: 1000 mg via ORAL
  Filled 2021-05-04: qty 2

## 2021-05-04 MED ORDER — LIDOCAINE 5 % EX PTCH
3.0000 | MEDICATED_PATCH | CUTANEOUS | Status: DC
  Administered 2021-05-04: 3 via TRANSDERMAL
  Filled 2021-05-04: qty 3

## 2021-05-04 MED ORDER — METHOCARBAMOL 500 MG PO TABS: 500.0000 mg | ORAL_TABLET | Freq: Two times a day (BID) | ORAL | 0 refills | Status: DC

## 2021-05-04 NOTE — Telephone Encounter (Signed)
Error
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
## 2021-05-04 NOTE — ED Notes (Signed)
Pt ambulatory to xray, steady gait.

## 2021-05-04 NOTE — Telephone Encounter (Signed)
Spoke with patients significant other, provider wanted to confirm if he was still a patient here at [facility]. She confirmed that he is not, sees a provider in [place] currently. Dr Maurene Capes.

## 2021-05-06 DIAGNOSIS — H2513 Age-related nuclear cataract, bilateral: Secondary | ICD-10-CM | POA: Diagnosis not present

## 2021-05-06 DIAGNOSIS — H401133 Primary open-angle glaucoma, bilateral, severe stage: Secondary | ICD-10-CM | POA: Diagnosis not present

## 2021-05-06 DIAGNOSIS — J449 Chronic obstructive pulmonary disease, unspecified: Secondary | ICD-10-CM | POA: Diagnosis not present

## 2021-05-10 DIAGNOSIS — R7303 Prediabetes: Secondary | ICD-10-CM | POA: Diagnosis not present

## 2021-05-10 DIAGNOSIS — E611 Iron deficiency: Secondary | ICD-10-CM | POA: Diagnosis not present

## 2021-05-10 DIAGNOSIS — M545 Low back pain, unspecified: Secondary | ICD-10-CM | POA: Diagnosis not present

## 2021-05-11 ENCOUNTER — Ambulatory Visit: Payer: Medicare Other | Admitting: Nurse Practitioner

## 2021-06-15 ENCOUNTER — Ambulatory Visit: Payer: Medicare Other

## 2021-07-20 ENCOUNTER — Other Ambulatory Visit: Payer: Self-pay | Admitting: Orthopaedic Surgery

## 2021-07-20 DIAGNOSIS — M542 Cervicalgia: Secondary | ICD-10-CM

## 2021-07-20 DIAGNOSIS — M545 Low back pain, unspecified: Secondary | ICD-10-CM

## 2021-07-31 ENCOUNTER — Ambulatory Visit
Admission: RE | Admit: 2021-07-31 | Discharge: 2021-07-31 | Disposition: A | Discharge status: Home / Self Care | Payer: Medicare Other | Source: Ambulatory Visit | Attending: Orthopaedic Surgery | Admitting: Orthopaedic Surgery

## 2021-07-31 ENCOUNTER — Other Ambulatory Visit: Payer: Self-pay

## 2021-07-31 DIAGNOSIS — M542 Cervicalgia: Secondary | ICD-10-CM

## 2021-07-31 DIAGNOSIS — M545 Low back pain, unspecified: Secondary | ICD-10-CM
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
## 2021-07-31 IMAGING — MR MR LUMBAR SPINE W/O CM
4 of 5 series · 25 of 48 positions shown · non-contrast
Comparison: Prior radiograph from [DATE].

CLINICAL DATA: Initial evaluation for low back pain.

EXAM:
MRI LUMBAR SPINE WITHOUT CONTRAST
TECHNIQUE: Multiplanar, multisequence MR imaging of the lumbar spine was
performed. No intravenous contrast was administered.

[Series 4: T2 · sagittal · 4.0mm · 1.17mm/px · 6 of 16 slices shown (1 of 2)]
[im 1/16]
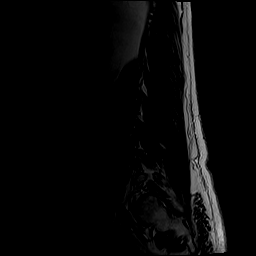
[im 4/16]
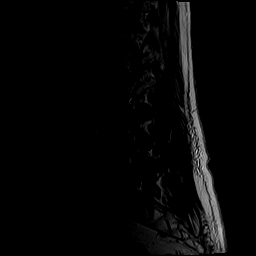
[im 7/16]
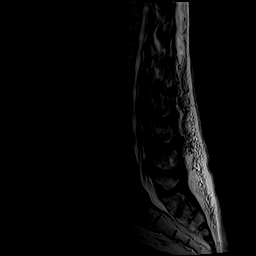
[im 10/16]
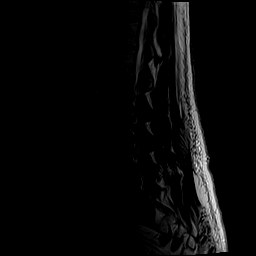
[im 13/16]
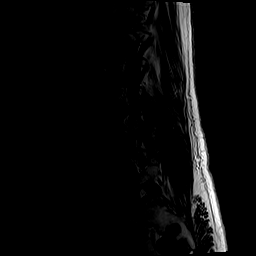
[im 16/16]
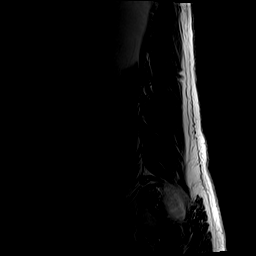

[Series 6: T1 · sagittal · 4.0mm · 1.17mm/px · 5 of 16 slices shown (1 of 2)]
[im 1/16]
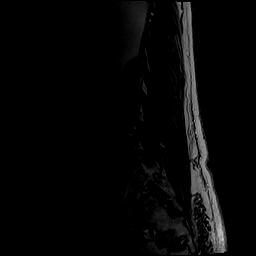
[im 4/16]
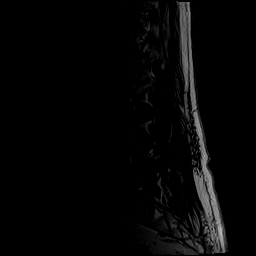
[im 8/16]
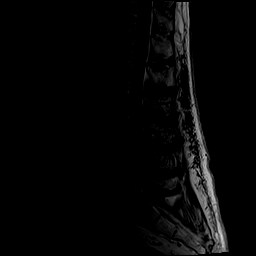
[im 12/16]
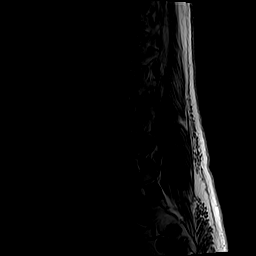
[im 16/16]
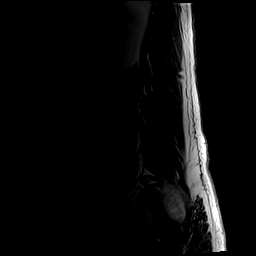

[Series 7: T2 · axial · 4.0mm · 0.39mm/px · z∈[-88,+136]mm · 10 of 47 slices shown (2 of 2)]
[im 4/47]
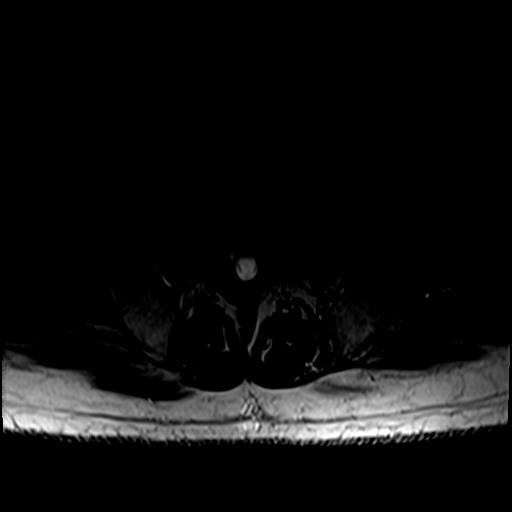
[im 7/47]
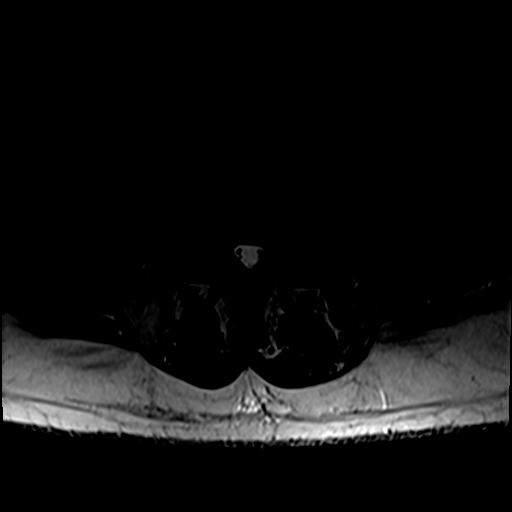
[im 10/47]
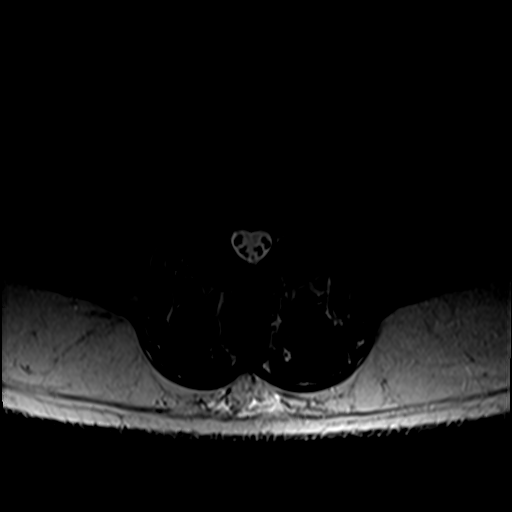
[im 16/47]
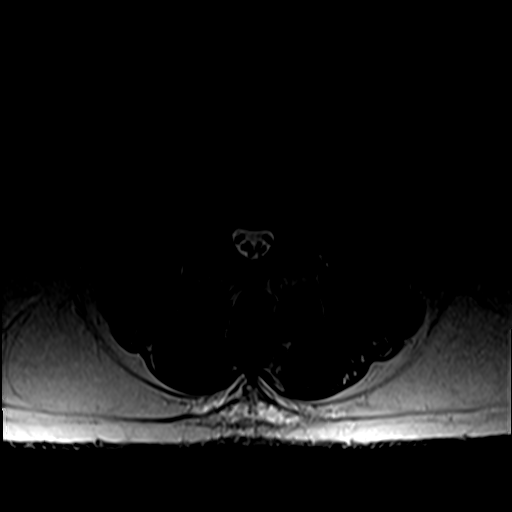
[im 22/47]
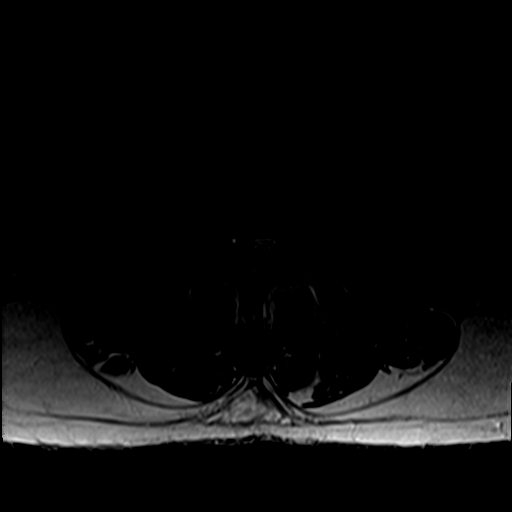
[im 25/47]
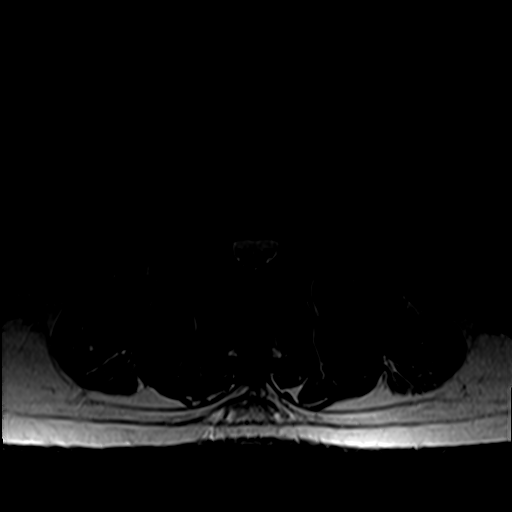
[im 28/47]
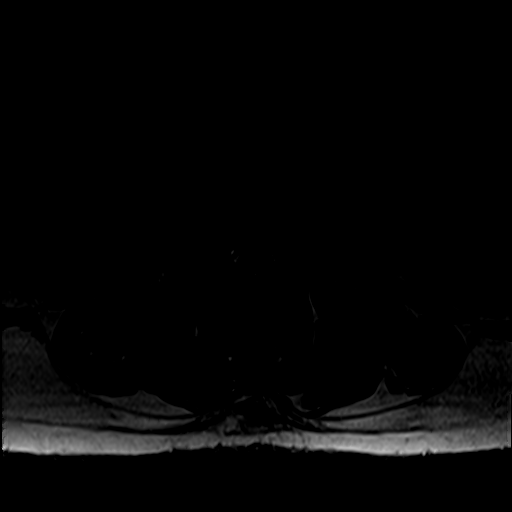
[im 34/47]
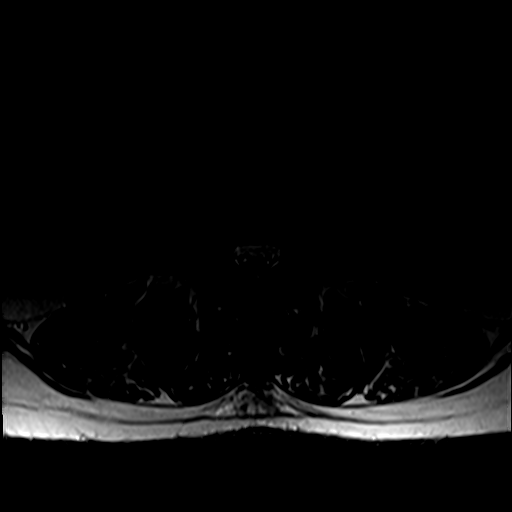
[im 40/47]
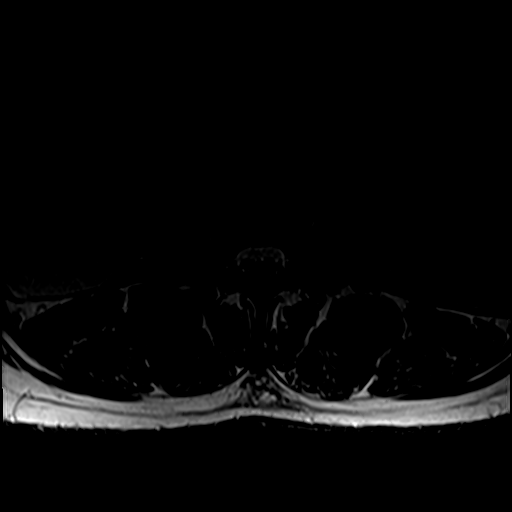
[im 47/47]
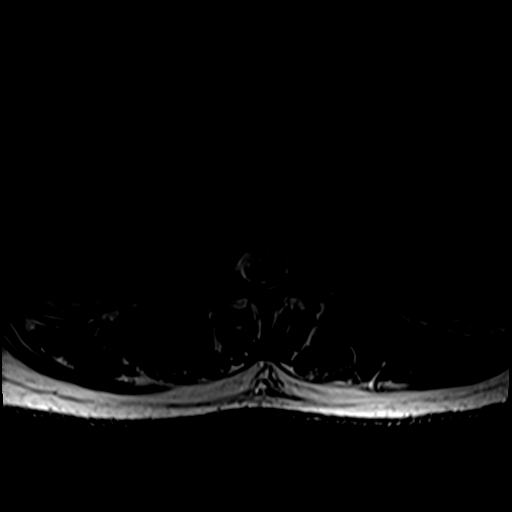

[Series 8: T1 · axial · 4.0mm · 0.39mm/px · z∈[-88,+103]mm · 4 of 47 slices shown (2 of 2)]
[im 4/47]
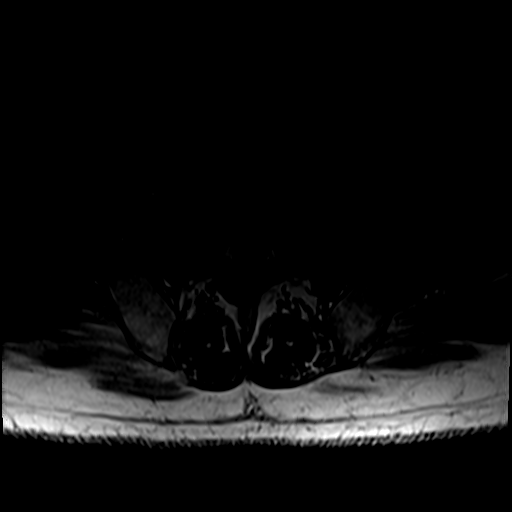
[im 7/47]
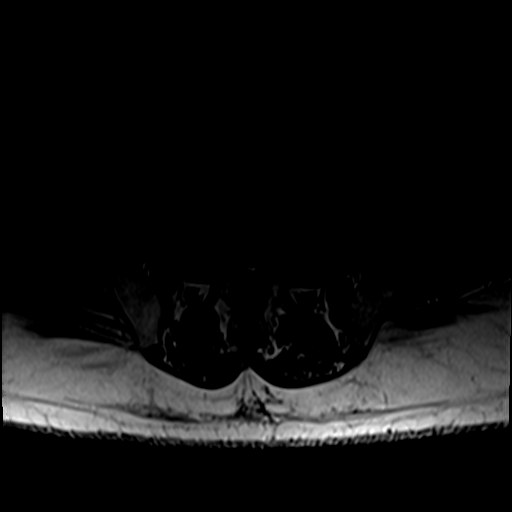
[im 25/47]
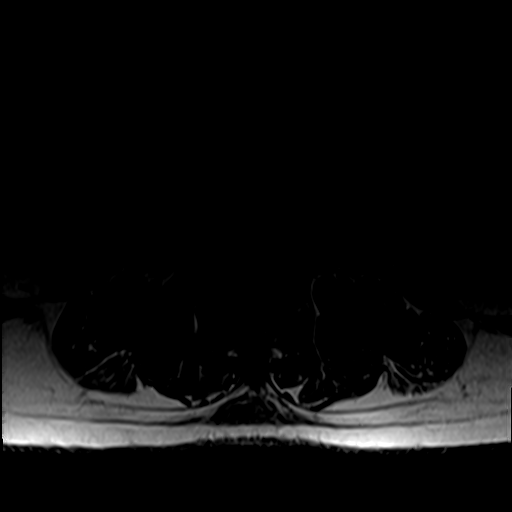
[im 40/47]
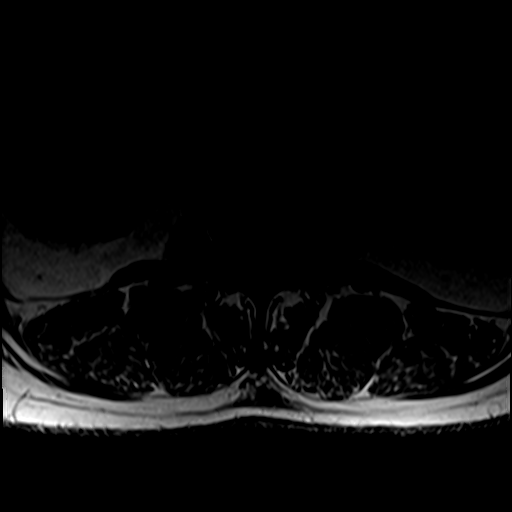

[25 of 48 positions shown; findings below may reference images not displayed]

FINDINGS: Segmentation: Standard. Lowest well-formed disc space labeled the
L5-S1 level.

Alignment: Physiologic with preservation of the normal lumbar
lordosis. No listhesis.

Vertebrae: Vertebral body height maintained without acute or chronic
fracture. Bone marrow signal intensity within normal limits. No
discrete or worrisome osseous lesions. Mild reactive marrow edema
present about the right L4-5 facet due to facet arthritis (series 5,
image 5). No other abnormal marrow edema.

Conus medullaris and cauda equina: Conus extends to the T12-L1
level. Conus and cauda equina appear normal.

Paraspinal and other soft tissues: Unremarkable.

Disc levels:

L1-2: Negative interspace. Mild facet and ligament flavum
hypertrophy. No spinal stenosis. Foramina remain patent.

L2-3: Mild disc desiccation with diffuse disc bulge. Mild facet and
ligament flavum hypertrophy. Resultant borderline mild canal with
left lateral recess stenosis. Foramina remain patent. No
impingement.

L3-4: Mild intervertebral disc space narrowing with disc desiccation
and diffuse disc bulge. Superimposed broad-based left extraforaminal
disc protrusion contacts the exiting left L3 nerve root as it
courses of the left neural foramen (series 7, image 27). Mild facet
and ligament flavum hypertrophy. Resultant mild-to-moderate canal
with bilateral subarticular stenosis. Mild bilateral L3 foraminal
narrowing.

L4-5: Disc desiccation with mild disc bulge. Mild reactive endplate
spurring. Mild to moderate facet and ligament flavum hypertrophy.
Associated mild edema about the right L4-5 facet. Resultant moderate
canal with bilateral subarticular stenosis. Mild bilateral L4
foraminal narrowing.

L5-S1: Disc desiccation with mild disc bulge. Associated small
central annular fissure. Mild facet and ligament flavum hypertrophy.
No significant spinal stenosis. Foramina remain patent.
IMPRESSION: 1. Disc bulge with facet hypertrophy at L4-5 with resultant moderate
canal and bilateral subarticular stenosis, with mild bilateral L4
foraminal narrowing.
2. Additional mild disc bulging with facet hypertrophy at L2-3 and
L3-4 with resultant mild to moderate canal and bilateral
subarticular stenosis as above. Superimposed left extraforaminal
disc protrusion at L3-4 could affect the exiting left L3 nerve root.
3. Mild reactive marrow edema about the right L4-5 facet due to
facet arthritis. Finding could serve as a source for lower back
pain.

## 2021-08-01 ENCOUNTER — Other Ambulatory Visit: Payer: Self-pay | Admitting: Interventional Cardiology

## 2021-09-22 ENCOUNTER — Other Ambulatory Visit: Payer: Self-pay | Admitting: Interventional Cardiology

## 2021-09-28 ENCOUNTER — Encounter: Payer: Self-pay | Admitting: Interventional Cardiology

## 2021-09-28 NOTE — Telephone Encounter (Signed)
Error

## 2021-10-19 NOTE — Progress Notes (Signed)
?Cardiology Office Note:   ? ?Date:  10/22/2021  ? ?ID:  Chad Moss, DOB 06/29/1955, MRN [id number] ? ?PCP:  Tonny Branch., MD ?  ?[facility] Providers ?Cardiologist:  Larae Grooms, MD    ? ?Referring MD: No ref. provider found  ? ?Chief Complaint: follow-up CAD ? ?History of Present Illness:   ? ?Chad Moss is a very pleasant 66 y.o. 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 ? ?Had a CT which showed CAD then underwent cardiac catheterization 03/13/2017 which revealed mid circumflex lesion with 75% stenosis treated successfully with DES. Of note, unable to use right radial artery due to high bifurcation of radial and ulnar arteries.  ? ?Has had symptoms of orthopnea and leg swelling in the past for which Dr. Irish Lack increased his diuretics. He was last seen in our office on 11/05/2020 by Dr. Irish Lack at which time no changes were made to his medical therapy and 1 year follow-up was recommended. ? ?Today, he is here alone for follow-up. He reports dyspnea on exertion, and he cannot lie down to sleep, has to sleep sitting up in a recliner. He denies PND. Has mild bilateral lower extremity edema. He denies chest pain, presyncope, syncope, palpitations, weakness, diaphoresis, and fatigue.  No bleeding concerns. Admits to eating an unrestricted diet lots of hot dogs, french fries, eats out more frequently than he eats at home.  Quit smoking one year ago when he coughed up blood. He is not very active at home.  ? ?Past Medical History:  ?Diagnosis Date  ? Allergic rhinitis   ? Anxiety   ? Atelectasis   ? CAD (coronary artery disease), native coronary artery   ? 9/18 PCI/DES x1 to mLCx, mild diffuse nonobstructive disease, EF 55% on Lv gram  ? Chronic bronchitis ([place])   ? COPD (chronic obstructive pulmonary disease) ([place])   ? Crohn's disease ([place])   ? Depression   ? DVT (deep  venous thrombosis) ([place])   ? RLE X 2  ? Dysphagia   ? Dyspnea   ? Enlarged prostate   ? GERD (gastroesophageal reflux disease)   ? Glaucoma, both eyes   ? Heart murmur   ? History of stomach ulcers 1980s  ? Hyperlipidemia   ? Hypertension   ? "off RX for years now cause of coughing w/Lisinopril" (03/13/2017)  ? IBS (irritable bowel syndrome)   ? Paranoid schizophrenia ([place])   ? Peripheral vascular disease ([place])   ? Pneumonia 1990s  ? Pre-diabetes   ? "one time" (03/13/2017)  ? Stroke [facility]) 04/2016  ? "eye stroke; left eye" (03/13/2017)  ? Syncopal episodes   ? ? ?Past Surgical History:  ?Procedure Laterality Date  ? ABDOMINAL HERNIA REPAIR  1990s  ? COLECTOMY  1985; ?1989; 1996  ? "part of my ileum removed"  ? CORONARY ANGIOPLASTY WITH STENT PLACEMENT  03/13/2017  ? CORONARY STENT INTERVENTION N/A 03/13/2017  ? Procedure: CORONARY STENT INTERVENTION;  Surgeon: Jettie Booze, MD;  Location: [facility];  Service: Cardiovascular;  Laterality: N/A;  ? EYE SURGERY Right   ? "laser; for glaucoma" (03/13/2017)  ? FASCIOTOMY Right   ? HERNIA REPAIR    ? LAPAROSCOPIC CHOLECYSTECTOMY    ? LEFT HEART CATH AND CORONARY ANGIOGRAPHY N/A 03/13/2017  ? Procedure: LEFT HEART CATH AND CORONARY ANGIOGRAPHY;  Surgeon: Jettie Booze, MD;  Location: [facility];  Service: Cardiovascular;  Laterality: N/A;  ? PENILE PROSTHESIS IMPLANT  01/2011  ? Archie Endo 02/01/2011  ? PERIPHERAL VASCULAR CATHETERIZATION Right 1999  ? "had blood clots in my leg; went in to open them up; dye went into leg; ended up having a fasiotomy"  ? TONSILLECTOMY    ? ? ?Current Medications: ?No outpatient medications have been marked as taking for the 10/21/21 encounter (Office Visit) with Ann Maki, Lanice Schwab, NP.  ?  ? ?Allergies:   Benztropine, Codeine, Meperidine, Cyclobenzaprine, Penicillins, Sulfamethoxazole-trimethoprim, and Sulfonamide derivatives  ? ?Social History  ? ?Socioeconomic History  ? Marital status: Single  ?  Spouse name: Not on  file  ? Number of children: Not on file  ? Years of education: 12 grade level   ? Highest education level: 12th grade  ?Occupational History  ? Occupation: disabled  ?Tobacco Use  ? Smoking status: Former  ?  Packs/day: 2.00  ?  Years: 50.00  ?  Pack years: 100.00  ?  Types: Cigarettes  ?  Start date: 11/18/1968  ? Smokeless tobacco: Never  ? Tobacco comments:  ?  currently smoking 2ppd  ?Vaping Use  ? Vaping Use: Never used  ?Substance and Sexual Activity  ? Alcohol use: No  ?  Alcohol/week: 0.0 standard drinks  ? Drug use: No  ? Sexual activity: Not on file  ?Other Topics Concern  ? Not on file  ?Social History Narrative  ? ** Merged History Encounter **  ?    ? ?Social Determinants of Health  ? ?Financial Resource Strain: Not on file  ?Food Insecurity: Not on file  ?Transportation Needs: Not on file  ?Physical Activity: Not on file  ?Stress: Not on file  ?Social Connections: Not on file  ?  ? ?Family History: ?The patient's family history includes Heart disease in his father and mother; Hypertension in his father and mother; Lung cancer in his brother. ? ?ROS:   ?Please see the history of present illness.    ?+ dyspnea on exertion ?+ orthopnea ?+ mild leg edema ?All other systems reviewed and are negative. ? ?Labs/Other Studies Reviewed:   ? ?The following studies were reviewed today: ? ?LHC 03/13/2017 ? ?Prox RCA lesion, 25 %stenosed. ?Mid LAD lesion, 25 %stenosed. ?Ost 2nd Diag to 2nd Diag lesion, 25 %stenosed. ?The left ventricular systolic function is normal. ?LV end diastolic pressure is normal. ?The left ventricular ejection fraction is 55-65% by visual estimate. ?There is no aortic valve stenosis. ?Mid Cx lesion, 75 %stenosed. ?A STENT RESOLUTE ONYX [id number] drug eluting stent was successfully placed. ?Post intervention, there is a 0% residual stenosis. ?Unable to use right radial artery due to high bifurcation of radial and ulnar arteries. ?  ?He will need dual antiplatelet therapy for 6-12 months, along with  aggressive secondary prevention including smoking cessation.  ? ?Recent Labs: ?No results found for requested labs within last 8760 hours.  ?Recent Lipid Panel ?   ?Component Value Date/Time  ? CHOL 120 11/08/2020 1510  ? TRIG 147 11/08/2020 1510  ? HDL 34 (L) 11/08/2020 1510  ? CHOLHDL 3.5 11/08/2020 1510  ? [place] 60 11/08/2020 1510  ? ? ? ?Risk Assessment/Calculations:   ?  ? ? ?Physical Exam:   ? ?VS:  BP 120/80   Pulse 80   Ht 5' 11"  (1.803 m)   Wt 234 lb 6.4 oz (106.3 kg)   SpO2 100%   BMI 32.69 kg/m?    ? ?Wt Readings  from Last 3 Encounters:  ?10/21/21 234 lb 6.4 oz (106.3 kg)  ?05/03/21 235 lb (106.6 kg)  ?11/09/20 225 lb 3.2 oz (102.2 kg)  ?  ? ?GEN:  Well nourished, well developed in no acute distress ?HEENT: Normal ?NECK: No JVD; No carotid bruits ?CARDIAC: RRR, no murmurs, rubs, gallops ?RESPIRATORY:  Clear to auscultation without rales, wheezing or rhonchi  ?ABDOMEN: Soft, non-tender, non-distended ?MUSCULOSKELETAL:  Bilateral LE edema.  No deformity. 2+ pedal pulses, equal bilaterally ?SKIN: Warm and dry ?NEUROLOGIC:  Alert and oriented x 3 ?PSYCHIATRIC:  Normal affect  ? ?EKG:  EKG is ordered today.  The ekg ordered today demonstrates NSR at 80 bpm, LAFB, no acute change from previous ? ?Diagnoses:   ? ?1. Coronary artery disease involving native coronary artery of native heart with angina pectoris ([place])   ?2. Essential hypertension   ?3. Chronic diastolic heart failure ([place])   ?4. Tobacco abuse   ?5. Orthopnea   ?6. Dyspnea on exertion   ? ?Assessment and Plan:   ? ? ?CAD without angina: DES to mid circumflex 2018. He denies chest pain, dyspnea, or other symptoms concerning for angina.  No indication for further ischemic evaluation at this time. Beta-blocker held in the past due to COPD. No bleeding problems on DAPT. Continue Plavix, aspirin, amlodipine, Imdur, rosuvastatin.  ? ?Hyperlipidemia LDL goal < 70: LDL 19 on 04/29/21. Continue rosuvastatin.  ? ?Orthopnea/DOE: He is unable to lie flat  in his bed and is sleeping with his head elevated in a recliner.  He was previously walking for exercise but is no longer able to do that.  We will check echocardiogram to evaluate for congestive heart failu

## 2021-10-21 ENCOUNTER — Ambulatory Visit (INDEPENDENT_AMBULATORY_CARE_PROVIDER_SITE_OTHER): Payer: Medicare Other | Admitting: Nurse Practitioner

## 2021-10-21 ENCOUNTER — Encounter: Payer: Self-pay | Admitting: Nurse Practitioner

## 2021-10-21 VITALS — BP 120/80 | HR 80 | Ht 71.0 in | Wt 234.4 lb

## 2021-10-21 DIAGNOSIS — Z720 Tobacco use: Secondary | ICD-10-CM | POA: Diagnosis not present

## 2021-10-21 DIAGNOSIS — I25119 Atherosclerotic heart disease of native coronary artery with unspecified angina pectoris: Secondary | ICD-10-CM

## 2021-10-21 DIAGNOSIS — I5032 Chronic diastolic (congestive) heart failure: Secondary | ICD-10-CM | POA: Diagnosis not present

## 2021-10-21 DIAGNOSIS — I10 Essential (primary) hypertension: Secondary | ICD-10-CM | POA: Diagnosis not present

## 2021-10-21 DIAGNOSIS — R0609 Other forms of dyspnea: Secondary | ICD-10-CM

## 2021-10-21 DIAGNOSIS — R0601 Orthopnea: Secondary | ICD-10-CM

## 2021-10-21 MED ORDER — FUROSEMIDE 40 MG PO TABS: ORAL_TABLET | ORAL | 3 refills | Status: DC

## 2021-10-21 MED ORDER — CLOPIDOGREL BISULFATE 75 MG PO TABS: 75.0000 mg | ORAL_TABLET | Freq: Every day | ORAL | 3 refills | Status: DC

## 2021-10-21 MED ORDER — AMLODIPINE BESYLATE 5 MG PO TABS: ORAL_TABLET | ORAL | 3 refills | Status: DC

## 2021-10-21 MED ORDER — ROSUVASTATIN CALCIUM 20 MG PO TABS: 20.0000 mg | ORAL_TABLET | Freq: Every day | ORAL | 3 refills | Status: DC

## 2021-10-21 MED ORDER — NITROGLYCERIN 0.4 MG SL SUBL: 0.4000 mg | SUBLINGUAL_TABLET | SUBLINGUAL | 6 refills | Status: DC | PRN

## 2021-10-21 MED ORDER — ISOSORBIDE MONONITRATE ER 30 MG PO TB24: 30.0000 mg | ORAL_TABLET | Freq: Every day | ORAL | 3 refills | Status: DC

## 2021-10-21 NOTE — Patient Instructions (Signed)
Medication Instructions:  ? ?Your physician recommends that you continue on your current medications as directed. Please refer to the Current Medication list given to you today. ? ? ?*If you need a refill on your cardiac medications before your next appointment, please call your pharmacy* ? ? ?Lab Work: ? ?None ordered!! ? ?If you have labs (blood work) drawn today and your tests are completely normal, you will receive your results only by: ?MyChart Message (if you have MyChart) OR ?A paper copy in the mail ?If you have any lab test that is abnormal or we need to change your treatment, we will call you to review the results. ? ? ?Testing/Procedures: ? ? ?None ordered!! ? ? ?Follow-Up: ?At [facility], you and your health needs are our priority.  As part of our continuing mission to provide you with exceptional heart care, we have created designated Provider Care Teams.  These Care Teams include your primary Cardiologist (physician) and Advanced Practice Providers (APPs -  Physician Assistants and Nurse Practitioners) who all work together to provide you with the care you need, when you need it. ? ?We recommend signing up for the patient portal called "MyChart".  Sign up information is provided on this After Visit Summary.  MyChart is used to connect with patients for Virtual Visits (Telemedicine).  Patients are able to view lab/test results, encounter notes, upcoming appointments, etc.  Non-urgent messages can be sent to your provider as well.   ?To learn more about what you can do with MyChart, go to [email or web address].   ? ?Your next appointment:   ?6 month(s) ? ?The format for your next appointment:   ?In Person ? ?Provider:   ?Larae Grooms, MD   ? ? ?Other Instructions ?DASH Eating Plan ?DASH stands for Dietary Approaches to Stop Hypertension. The DASH eating plan is a healthy eating plan that has been shown to: ?Reduce high blood pressure (hypertension). ?Reduce your risk for type 2 diabetes, heart  disease, and stroke. ?Help with weight loss. ?What are tips for following this plan? ?Reading food labels ?Check food labels for the amount of salt (sodium) per serving. Choose foods with less than 5 percent of the Daily Value of sodium. Generally, foods with less than 300 milligrams (mg) of sodium per serving fit into this eating plan. ?To find whole grains, look for the word "whole" as the first word in the ingredient list. ?Shopping ?Buy products labeled as "low-sodium" or "no salt added." ?Buy fresh foods. Avoid canned foods and pre-made or frozen meals. ?Cooking ?Avoid adding salt when cooking. Use salt-free seasonings or herbs instead of table salt or sea salt. Check with your health care provider or pharmacist before using salt substitutes. ?Do not fry foods. Cook foods using healthy methods such as baking, boiling, grilling, roasting, and broiling instead. ?Cook with heart-healthy oils, such as olive, canola, avocado, soybean, or sunflower oil. ?Meal planning ? ?Eat a balanced diet that includes: ?4 or more servings of fruits and 4 or more servings of vegetables each day. Try to fill one-half of your plate with fruits and vegetables. ?6-8 servings of whole grains each day. ?Less than 6 oz (170 g) of lean meat, poultry, or fish each day. A 3-oz (85-g) serving of meat is about the same size as a deck of cards. One egg equals 1 oz (28 g). ?2-3 servings of low-fat dairy each day. One serving is 1 cup (237 mL). ?1 serving of nuts, seeds, or beans 5 times each week. ?2-3 servings  of heart-healthy fats. Healthy fats called omega-3 fatty acids are found in foods such as walnuts, flaxseeds, fortified milks, and eggs. These fats are also found in cold-water fish, such as sardines, salmon, and mackerel. ?Limit how much you eat of: ?Canned or prepackaged foods. ?Food that is high in trans fat, such as some fried foods. ?Food that is high in saturated fat, such as fatty meat. ?Desserts and other sweets, sugary drinks,  and other foods with added sugar. ?Full-fat dairy products. ?Do not salt foods before eating. ?Do not eat more than 4 egg yolks a week. ?Try to eat at least 2 vegetarian meals a week. ?Eat more home-cooked food and less restaurant, buffet, and fast food. ?Lifestyle ?When eating at a restaurant, ask that your food be prepared with less salt or no salt, if possible. ?If you drink alcohol: ?Limit how much you use to: ?0-1 drink a day for women who are not pregnant. ?0-2 drinks a day for men. ?Be aware of how much alcohol is in your drink. In the [place] one drink equals one 12 oz bottle of beer (355 mL), one 5 oz glass of wine (148 mL), or one 1? oz glass of hard liquor (44 mL). ?General information ?Avoid eating more than 2,300 mg of salt a day. If you have hypertension, you may need to reduce your sodium intake to 1,500 mg a day. ?Work with your health care provider to maintain a healthy body weight or to lose weight. Ask what an ideal weight is for you. ?Get at least 30 minutes of exercise that causes your heart to beat faster (aerobic exercise) most days of the week. Activities may include walking, swimming, or biking. ?Work with your health care provider or dietitian to adjust your eating plan to your individual calorie needs. ?What foods should I eat? ?Fruits ?All fresh, dried, or frozen fruit. Canned fruit in natural juice (without added sugar). ?Vegetables ?Fresh or frozen vegetables (raw, steamed, roasted, or grilled). Low-sodium or reduced-sodium tomato and vegetable juice. Low-sodium or reduced-sodium tomato sauce and tomato paste. Low-sodium or reduced-sodium canned vegetables. ?Grains ?Whole-grain or whole-wheat bread. Whole-grain or whole-wheat pasta. Brown rice. [place]. Bulgur. Whole-grain and low-sodium cereals. Pita bread. Low-fat, low-sodium crackers. Whole-wheat flour tortillas. ?Meats and other proteins ?Skinless chicken or [place]. Ground chicken or [place]. Pork with fat trimmed off. Fish and  seafood. Egg whites. Dried beans, peas, or lentils. Unsalted nuts, nut butters, and seeds. Unsalted canned beans. Lean cuts of beef with fat trimmed off. Low-sodium, lean precooked or cured meat, such as sausages or meat loaves. ?Dairy ?Low-fat (1%) or fat-free (skim) milk. Reduced-fat, low-fat, or fat-free cheeses. Nonfat, low-sodium ricotta or cottage cheese. Low-fat or nonfat yogurt. Low-fat, low-sodium cheese. ?Fats and oils ?Soft margarine without trans fats. Vegetable oil. Reduced-fat, low-fat, or light mayonnaise and salad dressings (reduced-sodium). Canola, safflower, olive, avocado, soybean, and sunflower oils. Avocado. ?Seasonings and condiments ?Herbs. Spices. Seasoning mixes without salt. ?Other foods ?Unsalted popcorn and pretzels. Fat-free sweets. ?The items listed above may not be a complete list of foods and beverages you can eat. Contact a dietitian for more information. ?What foods should I avoid? ?Fruits ?Canned fruit in a light or heavy syrup. Fried fruit. Fruit in cream or butter sauce. ?Vegetables ?Creamed or fried vegetables. Vegetables in a cheese sauce. Regular canned vegetables (not low-sodium or reduced-sodium). Regular canned tomato sauce and paste (not low-sodium or reduced-sodium). Regular tomato and vegetable juice (not low-sodium or reduced-sodium). Angie Fava. Olives. ?Grains ?Baked goods made with fat,  such as croissants, muffins, or some breads. Dry pasta or rice meal packs. ?Meats and other proteins ?Fatty cuts of meat. Ribs. Fried meat. Berniece Salines. Bologna, salami, and other precooked or cured meats, such as sausages or meat loaves. Fat from the back of a pig (fatback). Bratwurst. Salted nuts and seeds. Canned beans with added salt. Canned or smoked fish. Whole eggs or egg yolks. Chicken or [place] with skin. ?Dairy ?Whole or 2% milk, cream, and half-and-half. Whole or full-fat cream cheese. Whole-fat or sweetened yogurt. Full-fat cheese. Nondairy creamers. Whipped toppings. Processed  cheese and cheese spreads. ?Fats and oils ?Butter. Stick margarine. Lard. Shortening. Ghee. Bacon fat. Tropical oils, such as coconut, palm kernel, or palm oil. ?Seasonings and condiments ?Onion salt, ga

## 2021-10-22 ENCOUNTER — Encounter: Payer: Self-pay | Admitting: Nurse Practitioner

## 2021-10-24 NOTE — Addendum Note (Signed)
Addended byDanielle Dess on: 10/24/2021 12:07 PM ? ? Modules accepted: Orders ? ?

## 2021-10-25 NOTE — Addendum Note (Signed)
Addended byDanielle Dess on: 10/25/2021 11:23 AM ? ? Modules accepted: Orders ? ?

## 2021-10-26 ENCOUNTER — Telehealth: Payer: Self-pay | Admitting: Physician Assistant

## 2021-10-26 ENCOUNTER — Emergency Department (HOSPITAL_BASED_OUTPATIENT_CLINIC_OR_DEPARTMENT_OTHER): Payer: Medicare Other

## 2021-10-26 ENCOUNTER — Emergency Department (HOSPITAL_BASED_OUTPATIENT_CLINIC_OR_DEPARTMENT_OTHER)
Admission: EM | Admit: 2021-10-26 | Discharge: 2021-10-26 | Disposition: A | Discharge status: Home / Self Care | Payer: Medicare Other | Attending: Emergency Medicine | Admitting: Emergency Medicine

## 2021-10-26 ENCOUNTER — Encounter (HOSPITAL_BASED_OUTPATIENT_CLINIC_OR_DEPARTMENT_OTHER): Payer: Self-pay | Admitting: Urology

## 2021-10-26 DIAGNOSIS — R0602 Shortness of breath: Secondary | ICD-10-CM | POA: Insufficient documentation

## 2021-10-26 DIAGNOSIS — J449 Chronic obstructive pulmonary disease, unspecified: Secondary | ICD-10-CM | POA: Insufficient documentation

## 2021-10-26 DIAGNOSIS — E876 Hypokalemia: Secondary | ICD-10-CM | POA: Insufficient documentation

## 2021-10-26 DIAGNOSIS — Z7982 Long term (current) use of aspirin: Secondary | ICD-10-CM | POA: Insufficient documentation

## 2021-10-26 DIAGNOSIS — I509 Heart failure, unspecified: Secondary | ICD-10-CM | POA: Insufficient documentation

## 2021-10-26 DIAGNOSIS — F17200 Nicotine dependence, unspecified, uncomplicated: Secondary | ICD-10-CM | POA: Insufficient documentation

## 2021-10-26 DIAGNOSIS — Z79899 Other long term (current) drug therapy: Secondary | ICD-10-CM | POA: Diagnosis not present

## 2021-10-26 DIAGNOSIS — Z7902 Long term (current) use of antithrombotics/antiplatelets: Secondary | ICD-10-CM | POA: Insufficient documentation

## 2021-10-26 DIAGNOSIS — I2510 Atherosclerotic heart disease of native coronary artery without angina pectoris: Secondary | ICD-10-CM | POA: Diagnosis not present

## 2021-10-26 DIAGNOSIS — R0781 Pleurodynia: Secondary | ICD-10-CM | POA: Insufficient documentation

## 2021-10-26 LAB — CBC
HCT: 32.2 % — ABNORMAL LOW (ref 39.0–52.0)
Hemoglobin: 11.3 g/dL — ABNORMAL LOW (ref 13.0–17.0)
MCH: 31.1 pg (ref 26.0–34.0)
MCHC: 35.1 g/dL (ref 30.0–36.0)
MCV: 88.7 fL (ref 80.0–100.0)
Platelets: 169 10*3/uL (ref 150–400)
RBC: 3.63 MIL/uL — ABNORMAL LOW (ref 4.22–5.81)
RDW: 14.5 % (ref 11.5–15.5)
WBC: 8.2 10*3/uL (ref 4.0–10.5)
nRBC: 0 % (ref 0.0–0.2)

## 2021-10-26 LAB — HEPATIC FUNCTION PANEL
ALT: 24 U/L (ref 0–44)
AST: 32 U/L (ref 15–41)
Albumin: 3.6 g/dL (ref 3.5–5.0)
Alkaline Phosphatase: 61 U/L (ref 38–126)
Bilirubin, Direct: 0.1 mg/dL (ref 0.0–0.2)
Indirect Bilirubin: 0.3 mg/dL (ref 0.3–0.9)
Total Bilirubin: 0.4 mg/dL (ref 0.3–1.2)
Total Protein: 7.7 g/dL (ref 6.5–8.1)

## 2021-10-26 LAB — BASIC METABOLIC PANEL
Anion gap: 7 (ref 5–15)
BUN: 5 mg/dL — ABNORMAL LOW (ref 8–23)
CO2: 26 mmol/L (ref 22–32)
Calcium: 8.7 mg/dL — ABNORMAL LOW (ref 8.9–10.3)
Chloride: 105 mmol/L (ref 98–111)
Creatinine, Ser: 1.05 mg/dL (ref 0.61–1.24)
GFR, Estimated: >60 mL/min (ref >60)
Glucose, Bld: 101 mg/dL — ABNORMAL HIGH (ref 70–99)
Potassium: 3.1 mmol/L — ABNORMAL LOW (ref 3.5–5.1)
Sodium: 138 mmol/L (ref 135–145)

## 2021-10-26 LAB — URINALYSIS, ROUTINE W REFLEX MICROSCOPIC
Bilirubin Urine: NEGATIVE
Glucose, UA: NEGATIVE mg/dL
Hgb urine dipstick: NEGATIVE
Ketones, ur: NEGATIVE mg/dL
Leukocytes,Ua: NEGATIVE
Nitrite: NEGATIVE
Protein, ur: NEGATIVE mg/dL
Specific Gravity, Urine: 1.01 (ref 1.005–1.030)
pH: 7 (ref 5.0–8.0)

## 2021-10-26 LAB — D-DIMER, QUANTITATIVE: D-Dimer, Quant: 0.82 ug/mL-FEU — ABNORMAL HIGH (ref 0.00–0.50)

## 2021-10-26 LAB — TROPONIN I (HIGH SENSITIVITY)
Troponin I (High Sensitivity): 4 ng/L (ref <18)
Troponin I (High Sensitivity): 4 ng/L (ref <18)

## 2021-10-26 LAB — BRAIN NATRIURETIC PEPTIDE: B Natriuretic Peptide: 25.2 pg/mL (ref 0.0–100.0)

## 2021-10-26 IMAGING — CT CT ANGIO CHEST
2 of 9 series · 18 of 36 positions shown · IV contrast (agent unspecified)
Comparison: [DATE]

CLINICAL DATA: Chest pain.

EXAM:
CT ANGIOGRAPHY CHEST WITH CONTRAST
TECHNIQUE: Multidetector CT imaging of the chest was performed using the
standard protocol during bolus administration of intravenous
contrast. Multiplanar CT image reconstructions and MIPs were
obtained to evaluate the vascular anatomy.

[Series 7: pe thins · axial · 0.85mm/px · z∈[-278,-8]mm · 17 of 401 slices shown]
[im 21/401  lung]
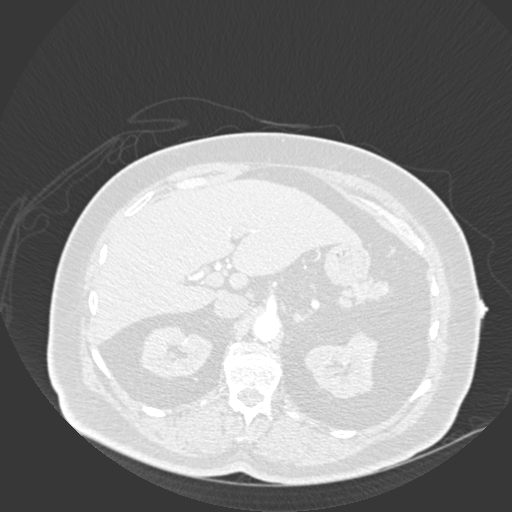
[im 41/401  mediastinal]
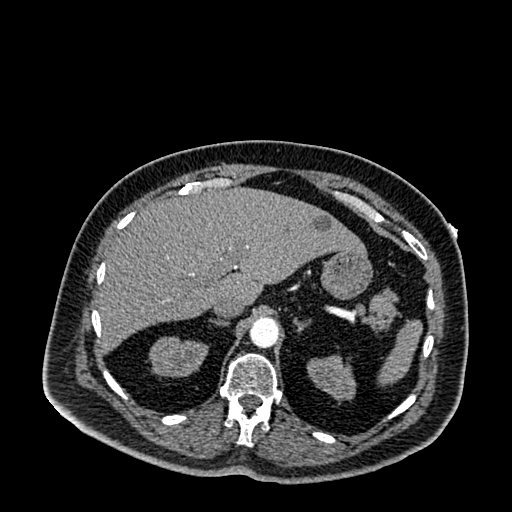
[im 61/401  lung]
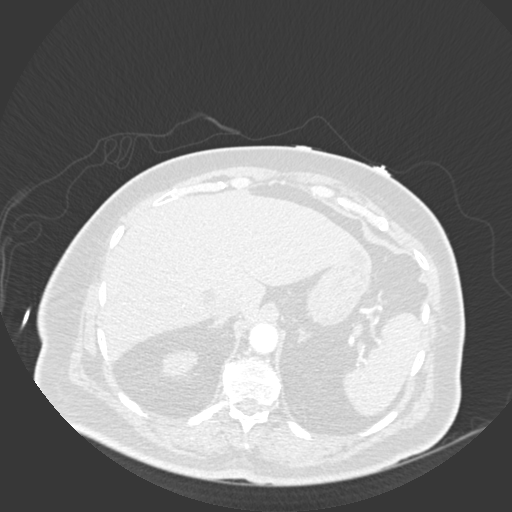
[im 81/401  mediastinal]
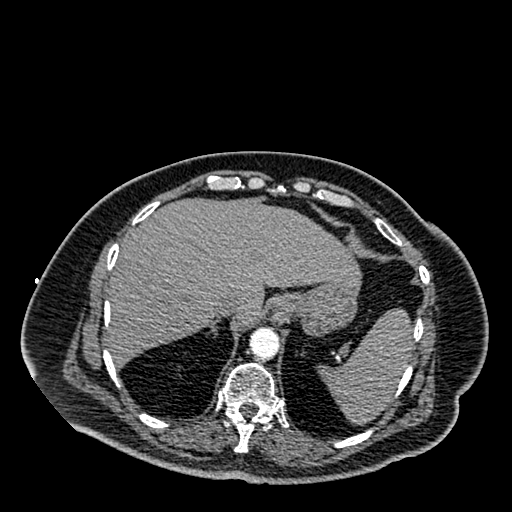
[im 121/401  lung]
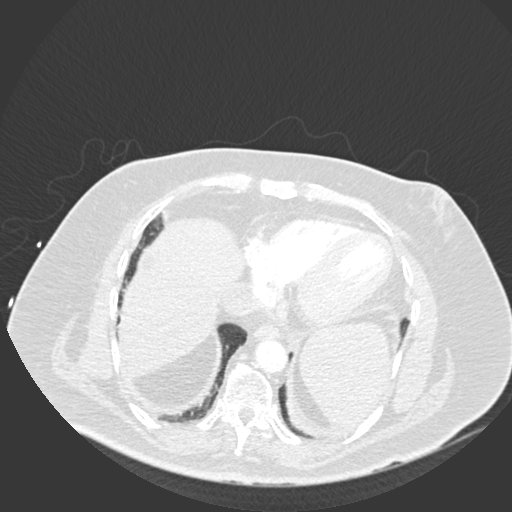
[im 141/401  mediastinal]
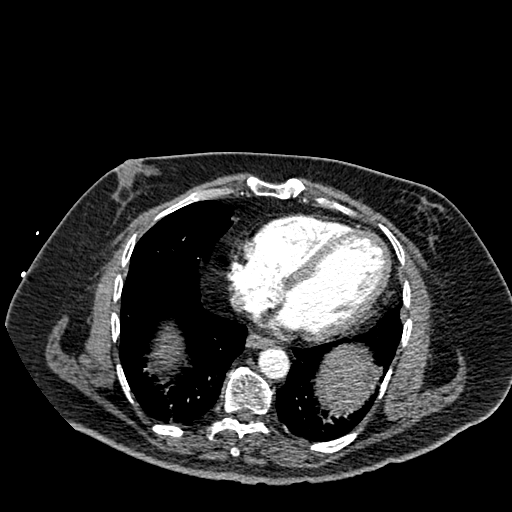
[im 161/401  lung]
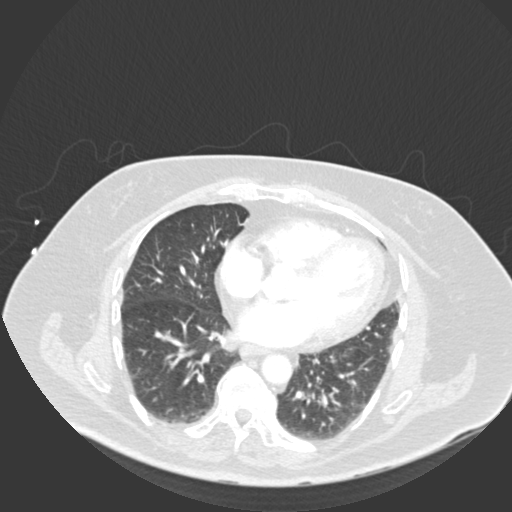
[im 181/401  mediastinal]
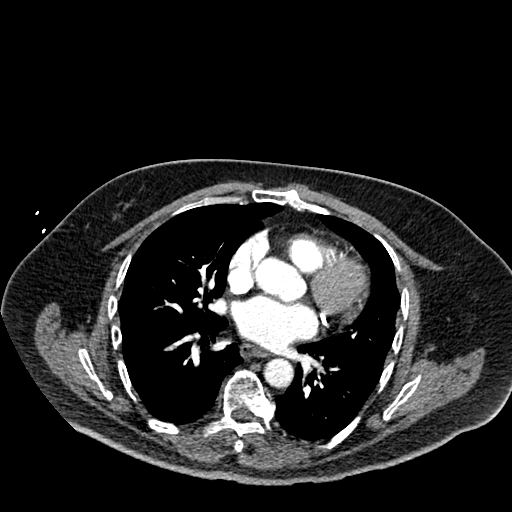
[im 201/401  lung]
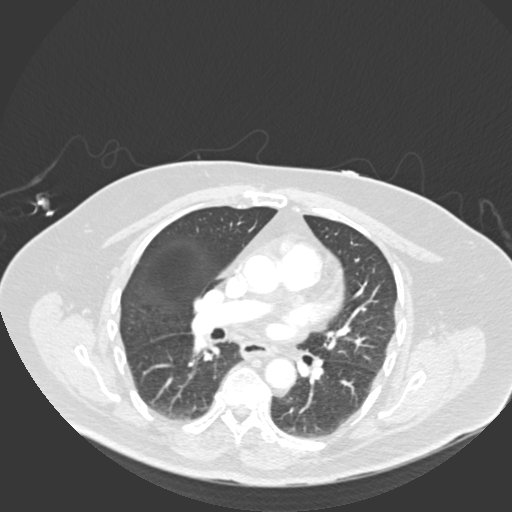
[im 221/401  mediastinal]
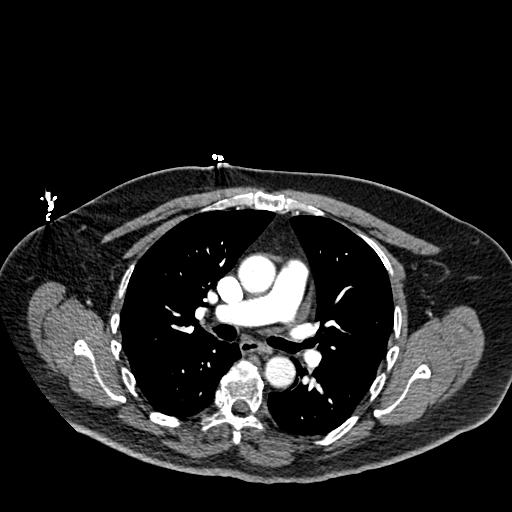
[im 241/401  lung]
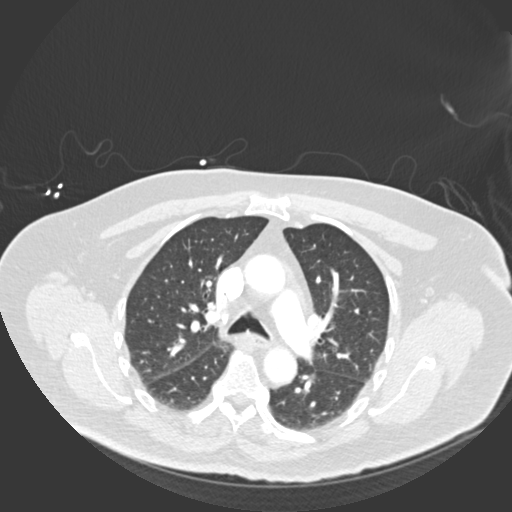
[im 261/401  mediastinal]
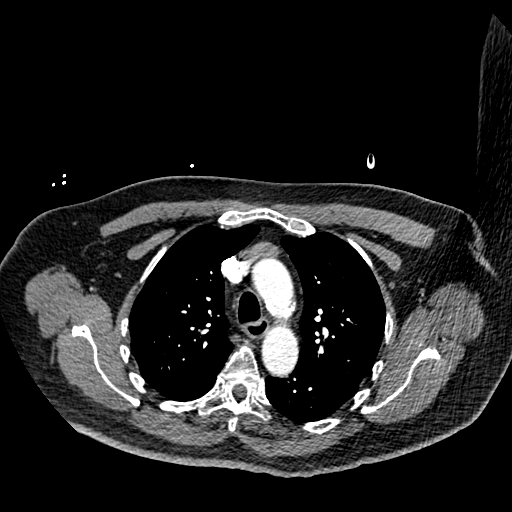
[im 281/401  lung]
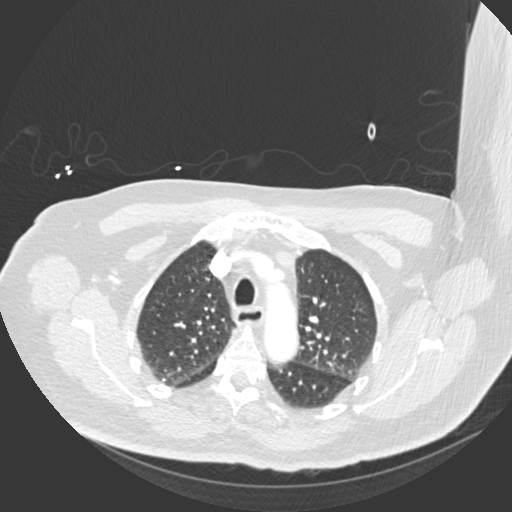
[im 321/401  mediastinal]
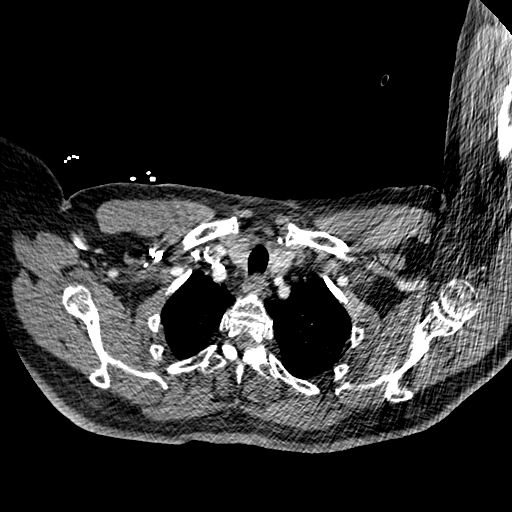
[im 341/401  lung]
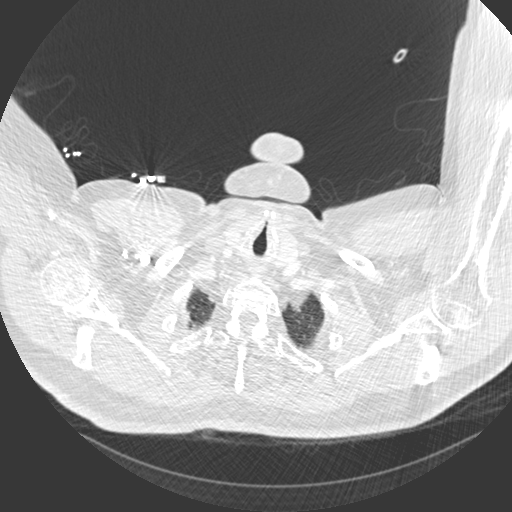
[im 361/401  mediastinal]
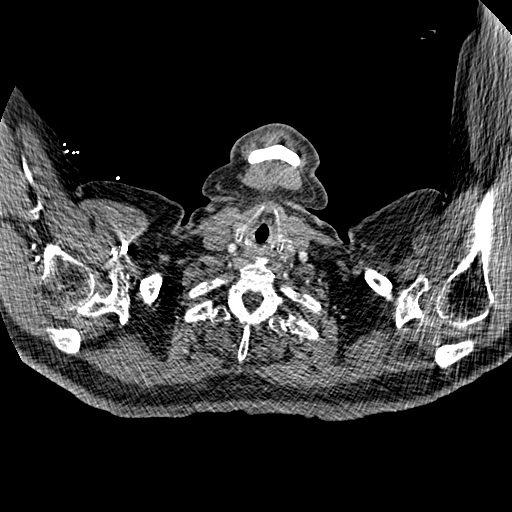
[im 381/401  lung]
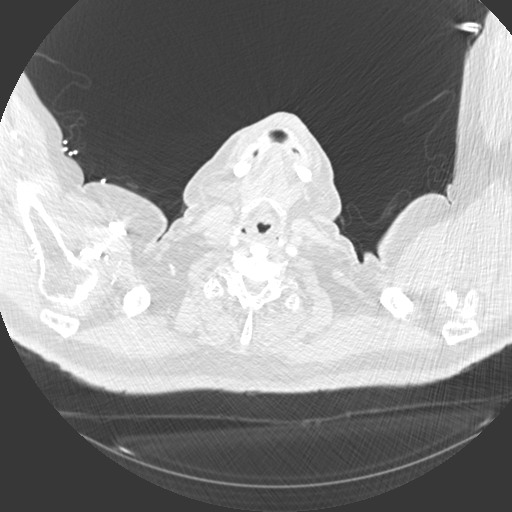

[Series 8: pe coronal mpr · coronal · 0.59mm/px · 1 of 101 slices shown]
[im 51/101  mediastinal]
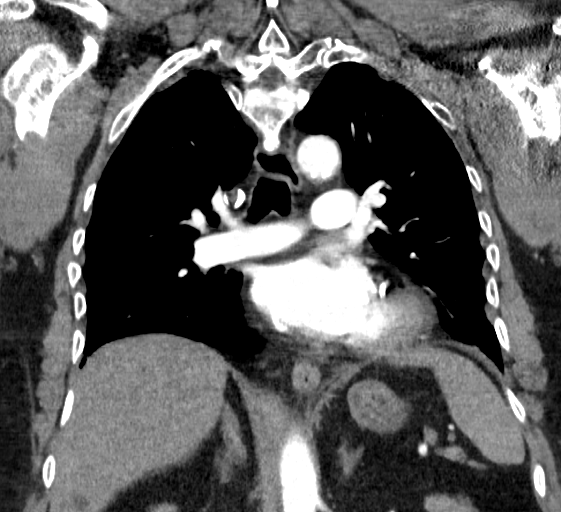

[18 of 36 positions shown; findings below may reference images not displayed]

RADIATION DOSE REDUCTION: This exam was performed according to the
departmental dose-optimization program which includes automated
exposure control, adjustment of the mA and/or kV according to
patient size and/or use of iterative reconstruction technique.

CONTRAST:  100mL OMNIPAQUE IOHEXOL 350 MG/ML SOLN
FINDINGS: Cardiovascular: There is mild to moderate severity calcification of
the aortic arch, without evidence of aortic aneurysm. Satisfactory
opacification of the pulmonary arteries to the segmental level. No
evidence of pulmonary embolism. Normal heart size. A coronary artery
stent is in place. No pericardial effusion.

Mediastinum/Nodes: No enlarged mediastinal, hilar, or axillary lymph
nodes. Thyroid gland, trachea, and esophagus demonstrate no
significant findings.

Lungs/Pleura: A stable, likely benign 4 mm noncalcified lung nodule
is seen within the anterolateral aspect of the right middle lobe
(axial CT image 55, CT series 6).

There is no evidence of acute infiltrate, pleural effusion or
pneumothorax.

Upper Abdomen: Numerous cystic appearing areas are seen within the
right left lobes of the liver.

Multiple surgical clips are seen within the gallbladder fossa.

Musculoskeletal: Multilevel degenerative changes seen throughout the
thoracic spine.

Review of the MIP images confirms the above findings.
IMPRESSION: 1. No evidence of pulmonary embolism or other acute intrathoracic
process.
2. Stable, likely benign 4 mm noncalcified right middle lobe lung
nodule.
3. Numerous hepatic cysts.
4. Evidence of prior cholecystectomy.

Aortic Atherosclerosis ([M7]-[M7]).

## 2021-10-26 IMAGING — CR DG CHEST 2V
2 series · 2 of 2 positions shown · non-contrast
Comparison: Previous studies including chest radiographs done on
[DATE] and CT done on [DATE]

CLINICAL DATA: Chest pain x2 days

EXAM:
CHEST - 2 VIEW

[w chest pa]
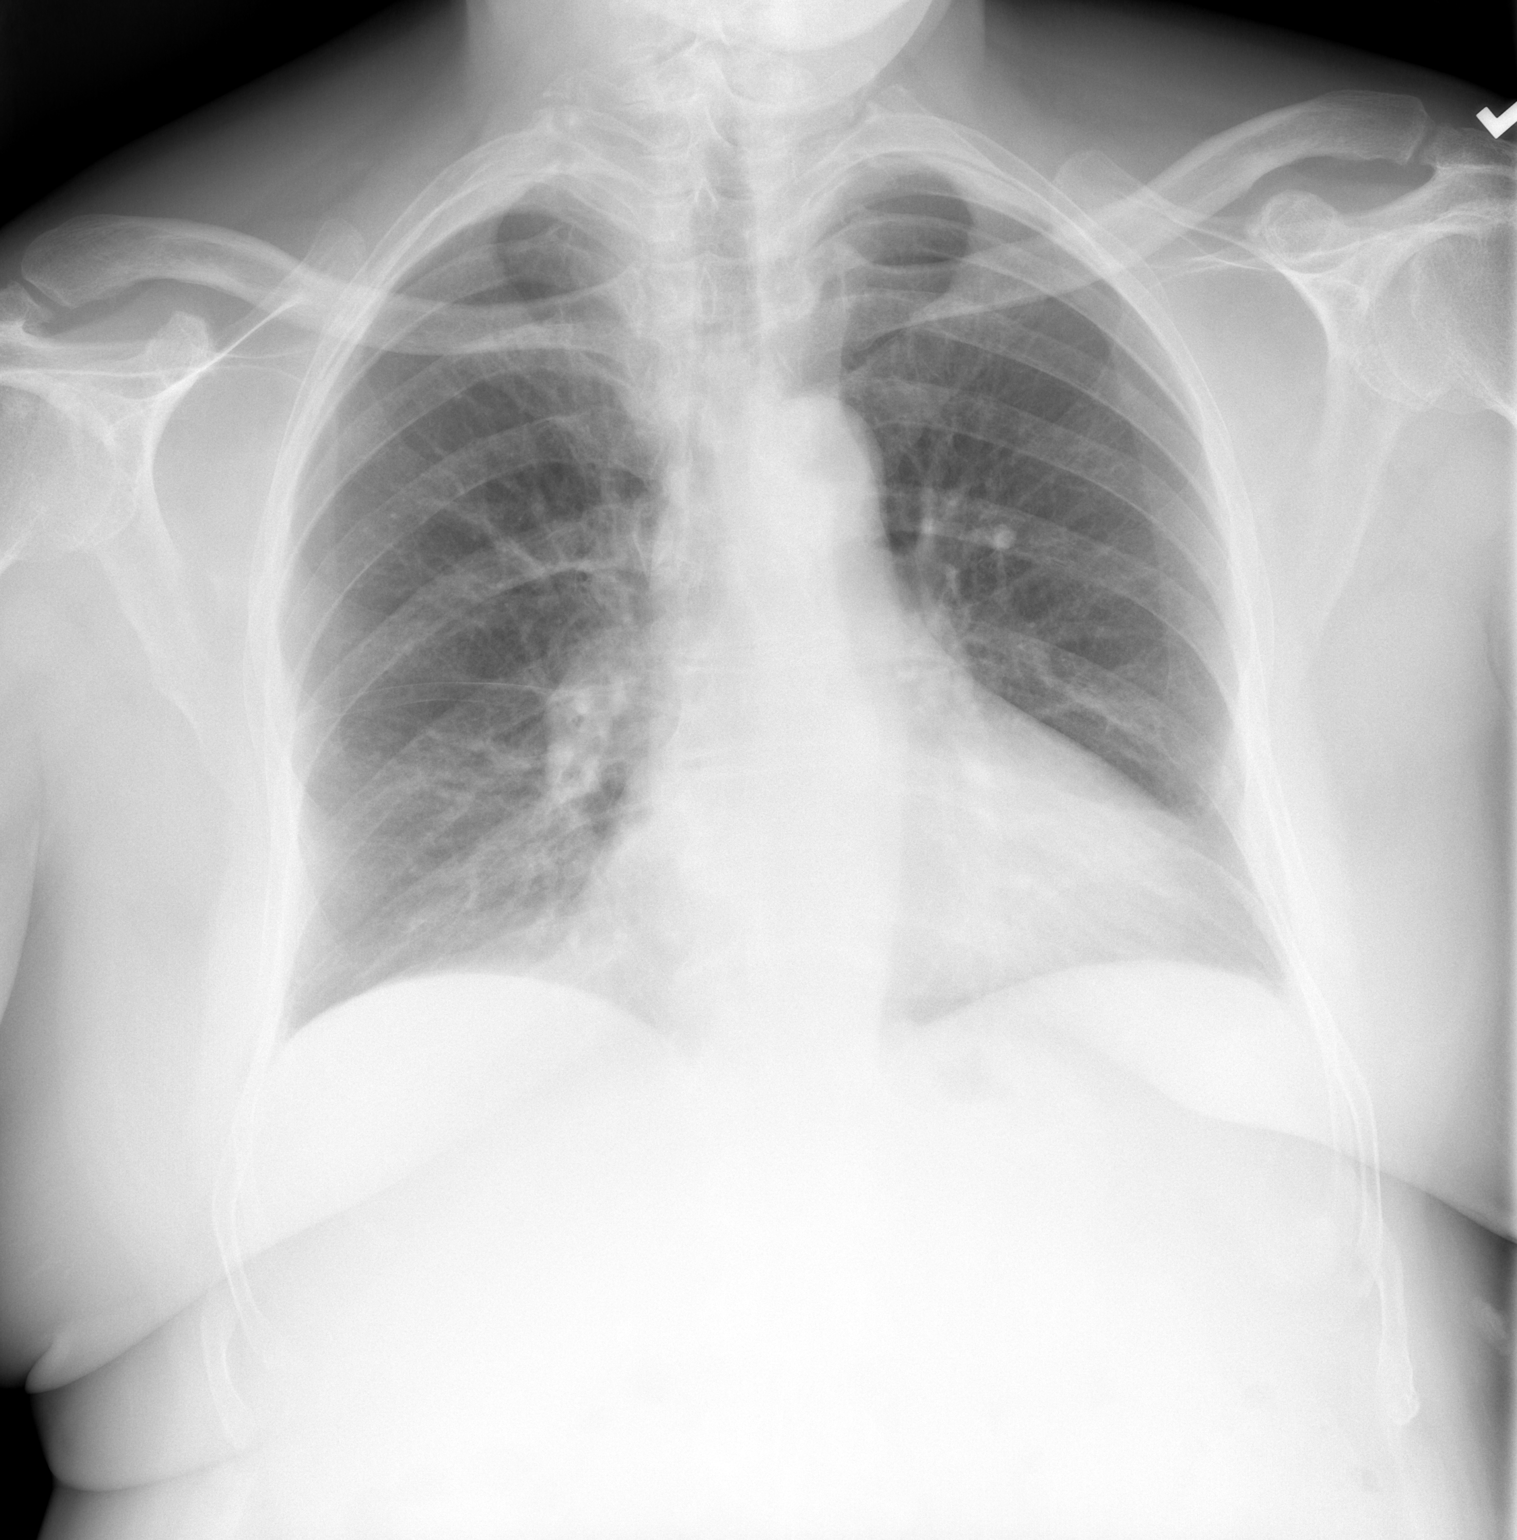

[w chest lat]
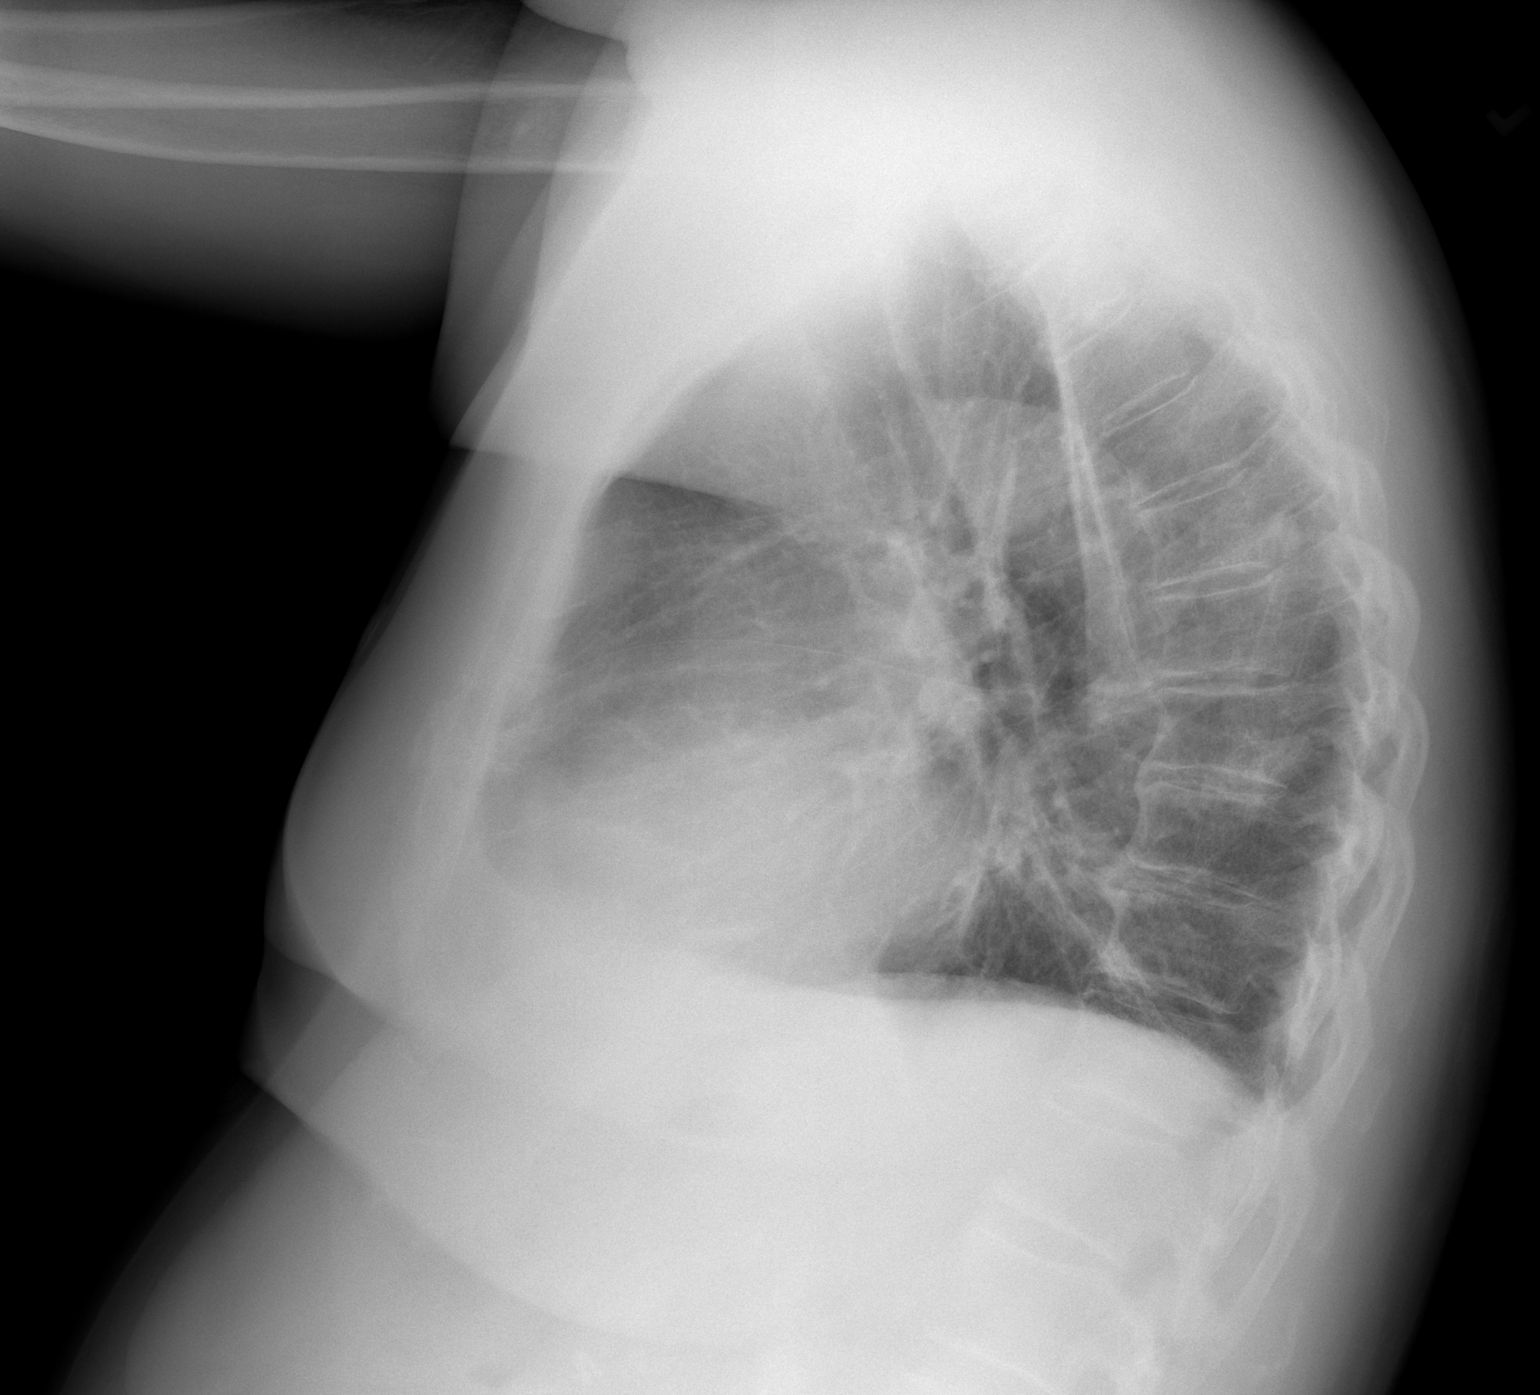

[2 of 2 positions shown; findings below may reference images not displayed]

FINDINGS: Transverse diameter of heart is slightly increased. There are no
signs of pulmonary edema or focal pulmonary consolidation. There is
no significant pleural effusion or pneumothorax.
IMPRESSION: No active cardiopulmonary disease.

## 2021-10-26 MED ORDER — IOHEXOL 350 MG/ML SOLN
100.0000 mL | Freq: Once | INTRAVENOUS | Status: AC | PRN
  Administered 2021-10-26: 100 mL via INTRAVENOUS

## 2021-10-26 NOTE — ED Provider Notes (Signed)
?[facility] ?Provider Note ? ? ?CSN: [id number] ?Arrival date & time: 10/26/21  1856 ? ?  ? ?History ? ?Chief Complaint  ?Patient presents with  ? Chest Pain  ? ? ?[facility] is a 66 y.o. male. ? ?Patient is a 66 year old male with a hx of CAD s/p DES to Mid Cx lesion, CVA causing left eye blindness, chronic CHF,  tobacco abuse, hyperlipidemia, Crohn's disease, and glaucoma who is presenting today with complaint of pleuritic type chest pain that started on Monday.  He reports that it has been present every day since Monday.  It seems to be worse when he moves a certain way, takes a deep breath or coughs.  He also notices that it seems worse when he bends over.  He has not had no palpitations, shortness of breath, abdominal pain.  Eating does not seem to make his symptoms worse.  He did try taking a nitroglycerin today because of the pain but it did not make him feel any better.  Patient's wife is present and also adds he has had worsening lower extremity swelling for some time but she feels it is because he sleeps on the couch and does not elevate his legs.  He continues to take his diuretic and denies any recent medication changes.  He did run out of potassium approximately 1 month ago and reports they have not refilled it but that is the only medication he has not had.  He does have prior history of DVT in the 90s but did not have any recurrence and is now currently only taking Plavix but no other stronger blood thinners. ? ?The history is provided by the patient and medical records.  ?Chest Pain ? ?  ? ?Home Medications ?Prior to Admission medications   ?Medication Sig Start Date End Date Taking? Authorizing Provider  ?albuterol (PROVENTIL) (2.5 MG/3ML) 0.083% nebulizer solution Take 3 mLs (2.5 mg total) by nebulization every 6 (six) hours as needed for wheezing or shortness of breath. 07/18/18   Brand Males, MD  ?albuterol (VENTOLIN HFA) 108 (90 Base) MCG/ACT inhaler  INHALE 2 PUFFS BY MOUTH INTO THE LUNGS EVERY 6 HOURS AS NEEDED WHEEZING OR SHORTNESS OF BREATH 08/06/20   Magdalen Spatz, NP  ?alfuzosin (UROXATRAL) 10 MG 24 hr tablet Take 10 mg by mouth at bedtime.    [provider]  ?amLODipine (NORVASC) 5 MG tablet TAKE 1 TABLET(5 MG) BY MOUTH DAILY 10/21/21   Swinyer, Lanice Schwab, NP  ?aspirin EC 81 MG tablet Take 81 mg by mouth daily.    [provider]  ?bimatoprost (LUMIGAN) 0.01 % SOLN Place 1 drop into the left eye at bedtime.    [provider]  ?brimonidine (ALPHAGAN) 0.15 % ophthalmic solution Place 1 drop into the left eye 2 (two) times daily.    [provider]  ?cholestyramine Lucrezia Starch) 4 G packet Take 1 packet by mouth daily.    [provider]  ?clonazePAM (KLONOPIN) 1 MG tablet Take 1 mg by mouth at bedtime. Anxiety    [provider]  ?clopidogrel (PLAVIX) 75 MG tablet Take 1 tablet (75 mg total) by mouth daily. 10/21/21   Swinyer, Lanice Schwab, NP  ?dicyclomine (BENTYL) 10 MG capsule Take 10 mg by mouth 4 (four) times daily -  before meals and at bedtime.    [provider]  ?diphenhydrAMINE (BENADRYL) 25 MG tablet Take 50 mg by mouth at bedtime.     [provider]  ?  doxycycline (VIBRA-TABS) 100 MG tablet Take 1 tablet (100 mg total) by mouth 2 (two) times daily. 12/01/20   Brand Males, MD  ?finasteride (PROSCAR) 5 MG tablet Take 5 mg by mouth daily.    [provider]  ?fluticasone (FLONASE) 50 MCG/ACT nasal spray SHAKE LIQUID AND USE 1 SPRAY IN [facility] NOSTRIL DAILY 10/27/19   Martyn Ehrich, NP  ?furosemide (LASIX) 40 MG tablet Take 1 tablet 4 times a week. 10/21/21   Swinyer, Lanice Schwab, NP  ?isosorbide mononitrate (IMDUR) 30 MG 24 hr tablet Take 1 tablet (30 mg total) by mouth daily. 10/21/21 10/22/22  Swinyer, Lanice Schwab, NP  ?lidocaine (LIDODERM) 5 % Place 1 patch onto the skin daily. Remove & Discard patch within 12 hours or as directed by MD 05/04/21   Palumbo, April, MD   ?loratadine (CLARITIN) 10 MG tablet TAKE 1 TABLET(10 MG) BY MOUTH DAILY 11/30/20   [name], FNP  ?loxapine (LOXITANE) 10 MG capsule Take 10 mg by mouth 2 (two) times daily. 1 tab in AM and 2 tabs in PM    [provider]  ?mercaptopurine (PURINETHOL) 50 MG tablet Take 75 mg by mouth daily. 1 tablet and a half tablet ?Give on an empty stomach 1 hour before or 2 hours after meals. Caution: Chemotherapy.    [provider]  ?mesalamine (PENTASA) 250 MG CR capsule Take 1,000 mg by mouth 4 (four) times daily.     [provider]  ?methocarbamol (ROBAXIN) 500 MG tablet Take 1 tablet (500 mg total) by mouth 2 (two) times daily. 05/04/21   Palumbo, April, MD  ?mirtazapine (REMERON) 30 MG tablet Take 45 mg by mouth at bedtime. 1.5 tablets by mouth daily 01/08/17   [provider]  ?nitroGLYCERIN (NITROSTAT) 0.4 MG SL tablet Place 1 tablet (0.4 mg total) under the tongue every 5 (five) minutes x 3 doses as needed for chest pain. 10/21/21   Swinyer, Lanice Schwab, NP  ?pantoprazole (PROTONIX) 40 MG tablet TAKE 1 TABLET(40 MG) BY MOUTH TWICE DAILY. 11/09/20   Jettie Booze, MD  ?phenol (CHLORASEPTIC GARGLE) 1.4 % LIQD Use as directed 1 spray in the mouth or throat as needed for throat irritation / pain. 09/06/19   Couture, Cortni S, PA-C  ?potassium chloride (KLOR-CON) 10 MEQ tablet Take 1 tablet (10 mEq total) by mouth 2 (two) times daily. 11/09/20   Jettie Booze, MD  ?rosuvastatin (CRESTOR) 20 MG tablet Take 1 tablet (20 mg total) by mouth daily. 10/21/21   Swinyer, Lanice Schwab, NP  ?SPIRIVA HANDIHALER 18 MCG inhalation capsule INHALE THE CONTENT OF 1 CAPSULE VIA HANDIHALER DAILY 07/28/19   Brand Males, MD  ?tamsulosin (FLOMAX) 0.4 MG CAPS capsule Take 0.4 mg by mouth at bedtime. 12/08/19   [provider]  ?timolol (TIMOPTIC) 0.5 % ophthalmic solution Place 1 drop into the left eye 2 (two) times daily. 07/21/16   [provider]  ?traZODone (DESYREL) 50 MG  tablet Take 150 mg by mouth at bedtime.    [provider]  ?   ? ?Allergies    ?Benztropine, Codeine, Meperidine, Cyclobenzaprine, Penicillins, Sulfamethoxazole-trimethoprim, and Sulfonamide derivatives   ? ?Review of Systems   ?Review of Systems  ?Cardiovascular:  Positive for chest pain.  ? ?Physical Exam ?Updated Vital Signs ?BP (!) 157/77   Pulse 75   Temp 98.2 ?F (36.8 ?C) (Oral)   Resp 18   Ht 5' 11"  (1.803 m)   Wt 106.1 kg   SpO2 95%  BMI 32.64 kg/m?  ?Physical Exam ?Vitals and nursing note reviewed.  ?Constitutional:   ?   General: He is not in acute distress. ?   Appearance: He is well-developed.  ?HENT:  ?   Head: Normocephalic and atraumatic.  ?Eyes:  ?   Conjunctiva/sclera: Conjunctivae normal.  ?   Pupils: Pupils are equal, round, and reactive to light.  ?Cardiovascular:  ?   Rate and Rhythm: Normal rate and regular rhythm.  ?   Pulses: Normal pulses.  ?   Heart sounds: No murmur heard. ?Pulmonary:  ?   Effort: Pulmonary effort is normal. No respiratory distress.  ?   Breath sounds: Normal breath sounds. No wheezing or rales.  ?Chest:  ?   Chest wall: Tenderness present.  ?Abdominal:  ?   General: There is no distension.  ?   Palpations: Abdomen is soft.  ?   Tenderness: There is no abdominal tenderness. There is no guarding or rebound.  ?Musculoskeletal:     ?   General: No tenderness. Normal range of motion.  ?   Cervical back: Normal range of motion and neck supple.  ?   Right lower leg: Edema present.  ?   Left lower leg: Edema present.  ?Skin: ?   General: Skin is warm and dry.  ?   Findings: No erythema or rash.  ?Neurological:  ?   Mental Status: He is alert and oriented to person, place, and time. Mental status is at baseline.  ?Psychiatric:     ?   Mood and Affect: Mood normal.     ?   Behavior: Behavior normal.  ? ? ?ED Results / Procedures / Treatments   ?Labs ?(all labs ordered are listed, but only abnormal results are displayed) ?Labs Reviewed  ?BASIC METABOLIC PANEL -  Abnormal; Notable for the following components:  ?    Result Value  ? Potassium 3.1 (*)   ? Glucose, Bld 101 (*)   ? BUN 5 (*)   ? Calcium 8.7 (*)   ? All other components within normal limits  ?CBC - Abnormal; Notable for the f

## 2021-10-26 NOTE — ED Notes (Signed)
Pt A&OX4 ambulatory at d/c with independent steady gait, NAD. Pt verbalized understanding of d/c instructions and follow up care. ?

## 2021-10-26 NOTE — ED Triage Notes (Signed)
Central chest pain that started Monday  ?Denies radiation of pain, Denies SOB  ?Was seen by cardiology on 5/5, has appointment for echo on 5/22 ?

## 2021-10-26 NOTE — ED Notes (Signed)
Patient transported to X-ray 

## 2021-10-26 NOTE — Discharge Instructions (Signed)
All the test today look good.  No signs of blood clots or heart attack.  Your urine test is normal.  You can try your inhaler at home that might help with the pain.  If you start having shortness of breath, severe pain, passing out or other concerns you can return to the emergency room but everything looks good today. ?

## 2021-10-26 NOTE — Telephone Encounter (Signed)
? ?  The patient's caretaker called the answering service after-hours today. He has been having CP that did not respond to [facility] NTG so she currently is checking him in the ED at the [facility] on [address]. I told her I agree he needs to be seen in the ED and they will evaluate him from there. No additional action needed on our end presently. ? ?Charlie Pitter, PA-C ? ?

## 2021-11-07 ENCOUNTER — Ambulatory Visit (HOSPITAL_COMMUNITY): Payer: Medicare Other | Attending: Cardiovascular Disease

## 2021-11-07 DIAGNOSIS — I5032 Chronic diastolic (congestive) heart failure: Secondary | ICD-10-CM | POA: Insufficient documentation

## 2021-11-07 DIAGNOSIS — I25119 Atherosclerotic heart disease of native coronary artery with unspecified angina pectoris: Secondary | ICD-10-CM | POA: Diagnosis not present

## 2021-11-07 DIAGNOSIS — I10 Essential (primary) hypertension: Secondary | ICD-10-CM | POA: Insufficient documentation

## 2021-11-07 LAB — ECHOCARDIOGRAM COMPLETE
Area-P 1/2: 3.85 cm2
S' Lateral: 3.1 cm

## 2021-11-21 ENCOUNTER — Other Ambulatory Visit: Payer: Self-pay

## 2021-11-21 MED ORDER — ROSUVASTATIN CALCIUM 20 MG PO TABS: 20.0000 mg | ORAL_TABLET | Freq: Every day | ORAL | 3 refills | Status: DC

## 2021-12-17 ENCOUNTER — Other Ambulatory Visit: Payer: Self-pay | Admitting: Interventional Cardiology

## 2021-12-26 ENCOUNTER — Other Ambulatory Visit: Payer: Self-pay | Admitting: Interventional Cardiology

## 2021-12-27 NOTE — Telephone Encounter (Signed)
Called [facility] in [place] and they pulled prescription from [facility] in [place] for patient to have it filled there in [place].

## 2022-01-24 ENCOUNTER — Telehealth: Payer: Self-pay | Admitting: Primary Care

## 2022-01-25 NOTE — Telephone Encounter (Signed)
Pt has not been seen since 04/25/2019. Pt will need to have a new consult appt prior to [place] being able to refill pt's meds. Attempted to call pt's caregiver Mliss Sax to let her know this but unable to reach. Left detailed message stating to her that pt will need a consult appt prior to receiving any refills.   Routing back to front desk pool with help in getting pt scheduled for a consult appt.

## 2022-01-27 NOTE — Telephone Encounter (Signed)
Patient scheduled 02/07/2022 with Geraldo Pitter NP.

## 2022-02-07 ENCOUNTER — Encounter: Payer: Self-pay | Admitting: Primary Care

## 2022-02-07 ENCOUNTER — Other Ambulatory Visit: Payer: Self-pay | Admitting: Interventional Cardiology

## 2022-02-07 ENCOUNTER — Ambulatory Visit (INDEPENDENT_AMBULATORY_CARE_PROVIDER_SITE_OTHER): Payer: Medicare Other | Admitting: Primary Care

## 2022-02-07 VITALS — BP 128/66 | HR 85 | Temp 98.4°F | Ht 71.0 in | Wt 228.0 lb

## 2022-02-07 DIAGNOSIS — G4719 Other hypersomnia: Secondary | ICD-10-CM

## 2022-02-07 DIAGNOSIS — I5032 Chronic diastolic (congestive) heart failure: Secondary | ICD-10-CM

## 2022-02-07 DIAGNOSIS — J439 Emphysema, unspecified: Secondary | ICD-10-CM

## 2022-02-07 MED ORDER — SPIRIVA HANDIHALER 18 MCG IN CAPS: ORAL_CAPSULE | RESPIRATORY_TRACT | 3 refills | Status: DC

## 2022-02-07 NOTE — Progress Notes (Signed)
@Patient  ID: Garnetta Buddy, male    DOB: 1955-11-30, 66 y.o.   MRN: [id number]  Chief Complaint  Patient presents with   Follow-up    Out of Spiriva and Albuterol    Referring provider: Tonny Branch.,*  HPI: 65 year old male, current every day smoker. PMH significant for COPD, emphysema, allergic rhinitis, HTN, CAD, crohns disease. Patient of Dr. Chase Caller, last seen by pulmonary NP on 04/25/2019. Following with cancer screening program.   02/07/2022 Patient presents today for an overdue follow-up COPD/emphysema. Accompanied by caretaker. She has noticed that his cough has been worse the last several weeks. Cough is non-productive. No significant shortness of breath. He is not limited by his breathing. CAT score is 4. He has been without Spiriva for >3 months. He does not sleep soundly at night. He has associated daytime sleepiness/fatigue and restless sleep. Epworth score 15. He is not good about elevating his legs or wearing compression stockings. He takes lasix 4 times week.   Allergies  Allergen Reactions   Benztropine Anaphylaxis   Codeine Anaphylaxis   Meperidine Swelling   Cyclobenzaprine Other (See Comments)    Pt. Does not remember   Penicillins Rash    Has patient had a PCN reaction causing immediate rash, facial/tongue/throat swelling, SOB or lightheadedness with hypotension: Yes Has patient had a PCN reaction causing severe rash involving mucus membranes or skin necrosis: No Has patient had a PCN reaction that required hospitalization No Has patient had a PCN reaction occurring within the last 10 years: No If all of the above answers are "NO", then may proceed with Cephalosporin use.    Sulfamethoxazole-Trimethoprim     REACTION: hives   Sulfonamide Derivatives     REACTION: hives    Immunization History  Administered Date(s) Administered   Fluad Quad(high Dose 65+) 06/09/2020   Influenza Split 04/06/2011, 03/12/2012, 03/31/2015   Influenza Whole  05/10/2009   Influenza,inj,Quad PF,6+ Mos 03/04/2013, 04/02/2017, 03/28/2018   Influenza-Unspecified 04/19/2014   PFIZER(Purple Top)SARS-COV-2 Vaccination 11/20/2018, 12/17/2019   Pneumococcal Conjugate-13 01/27/2009   Pneumococcal Polysaccharide-23 06/19/2008, 06/09/2020   Tdap 12/27/2017    Past Medical History:  Diagnosis Date   Allergic rhinitis    Anxiety    Atelectasis    CAD (coronary artery disease), native coronary artery    9/18 PCI/DES x1 to mLCx, mild diffuse nonobstructive disease, EF 55% on Lv gram   Chronic bronchitis (HCC)    COPD (chronic obstructive pulmonary disease) (HCC)    Crohn's disease (HCC)    Depression    DVT (deep venous thrombosis) (HCC)    RLE X 2   Dysphagia    Dyspnea    Enlarged prostate    GERD (gastroesophageal reflux disease)    Glaucoma, both eyes    Heart murmur    History of stomach ulcers 1980s   Hyperlipidemia    Hypertension    "off RX for years now cause of coughing w/Lisinopril" (03/13/2017)   IBS (irritable bowel syndrome)    Paranoid schizophrenia ([place])    Peripheral vascular disease ([place])    Pneumonia 1990s   Pre-diabetes    "one time" (03/13/2017)   Stroke ([place]) 04/2016   "eye stroke; left eye" (03/13/2017)   Syncopal episodes     Tobacco History: Social History   Tobacco Use  Smoking Status Former   Packs/day: 2.00   Years: 50.00   Total pack years: 100.00   Types: Cigarettes   Start date: 11/18/1968  Smokeless Tobacco Never  Tobacco Comments   currently smoking 2ppd   Counseling given: Not Answered Tobacco comments: currently smoking 2ppd   Outpatient Medications Prior to Visit  Medication Sig Dispense Refill   albuterol (PROVENTIL) (2.5 MG/3ML) 0.083% nebulizer solution Take 3 mLs (2.5 mg total) by nebulization every 6 (six) hours as needed for wheezing or shortness of breath. 300 mL 5   albuterol (VENTOLIN HFA) 108 (90 Base) MCG/ACT inhaler INHALE 2 PUFFS BY MOUTH INTO THE LUNGS EVERY 6 HOURS AS NEEDED  WHEEZING OR SHORTNESS OF BREATH 25.5 g 3   alfuzosin (UROXATRAL) 10 MG 24 hr tablet Take 10 mg by mouth at bedtime.     amLODipine (NORVASC) 5 MG tablet TAKE 1 TABLET(5 MG) BY MOUTH DAILY 90 tablet 3   aspirin EC 81 MG tablet Take 81 mg by mouth daily.     bimatoprost (LUMIGAN) 0.01 % SOLN Place 1 drop into the left eye at bedtime.     brimonidine (ALPHAGAN) 0.15 % ophthalmic solution Place 1 drop into the left eye 2 (two) times daily.     cholestyramine (QUESTRAN) 4 G packet Take 1 packet by mouth daily.     clonazePAM (KLONOPIN) 1 MG tablet Take 1 mg by mouth at bedtime. Anxiety     clopidogrel (PLAVIX) 75 MG tablet Take 1 tablet (75 mg total) by mouth daily. 90 tablet 3   dicyclomine (BENTYL) 10 MG capsule Take 10 mg by mouth 4 (four) times daily -  before meals and at bedtime.     diphenhydrAMINE (BENADRYL) 25 MG tablet Take 50 mg by mouth at bedtime.      doxycycline (VIBRA-TABS) 100 MG tablet Take 1 tablet (100 mg total) by mouth 2 (two) times daily. 10 tablet 0   finasteride (PROSCAR) 5 MG tablet Take 5 mg by mouth daily.     fluticasone (FLONASE) 50 MCG/ACT nasal spray SHAKE LIQUID AND USE 1 SPRAY IN EACH NOSTRIL DAILY 16 g 2   furosemide (LASIX) 40 MG tablet Take 1 tablet 4 times a week. 55 tablet 3   isosorbide mononitrate (IMDUR) 30 MG 24 hr tablet Take 1 tablet (30 mg total) by mouth daily. 90 tablet 3   lidocaine (LIDODERM) 5 % Place 1 patch onto the skin daily. Remove & Discard patch within 12 hours or as directed by MD 30 patch 0   loratadine (CLARITIN) 10 MG tablet TAKE 1 TABLET(10 MG) BY MOUTH DAILY 30 tablet 2   loxapine (LOXITANE) 10 MG capsule Take 10 mg by mouth 2 (two) times daily. 1 tab in AM and 2 tabs in PM     mercaptopurine (PURINETHOL) 50 MG tablet Take 75 mg by mouth daily. 1 tablet and a half tablet Give on an empty stomach 1 hour before or 2 hours after meals. Caution: Chemotherapy.     mesalamine (PENTASA) 250 MG CR capsule Take 1,000 mg by mouth 4 (four) times  daily.      methocarbamol (ROBAXIN) 500 MG tablet Take 1 tablet (500 mg total) by mouth 2 (two) times daily. 20 tablet 0   mirtazapine (REMERON) 30 MG tablet Take 45 mg by mouth at bedtime. 1.5 tablets by mouth daily     nitroGLYCERIN (NITROSTAT) 0.4 MG SL tablet Place 1 tablet (0.4 mg total) under the tongue every 5 (five) minutes x 3 doses as needed for chest pain. 25 tablet 6   pantoprazole (PROTONIX) 40 MG tablet TAKE 1 TABLET(40 MG) BY MOUTH TWICE DAILY 180 tablet 2   phenol (CHLORASEPTIC GARGLE)  1.4 % LIQD Use as directed 1 spray in the mouth or throat as needed for throat irritation / pain. 118 mL 0   potassium chloride (KLOR-CON) 10 MEQ tablet Take 1 tablet (10 mEq total) by mouth 2 (two) times daily. 180 tablet 3   rosuvastatin (CRESTOR) 20 MG tablet Take 1 tablet (20 mg total) by mouth daily. 90 tablet 3   solifenacin (VESICARE) 5 MG tablet Take 5 mg by mouth daily.     tamsulosin (FLOMAX) 0.4 MG CAPS capsule Take 0.4 mg by mouth at bedtime.     timolol (TIMOPTIC) 0.5 % ophthalmic solution Place 1 drop into the left eye 2 (two) times daily.     traZODone (DESYREL) 50 MG tablet Take 150 mg by mouth at bedtime.     SPIRIVA HANDIHALER 18 MCG inhalation capsule INHALE THE CONTENT OF 1 CAPSULE VIA HANDIHALER DAILY 30 capsule 0   No facility-administered medications prior to visit.   Review of Systems  Review of Systems  Constitutional:  Positive for fatigue.  HENT: Negative.    Respiratory: Negative.  Negative for cough, shortness of breath and wheezing.   Cardiovascular:  Positive for leg swelling.   Physical Exam  BP 128/66 (BP Location: Left Arm, Patient Position: Sitting, Cuff Size: Large)   Pulse 85   Temp 98.4 F (36.9 C) (Oral)   Ht 5' 11"  (1.803 m)   Wt 228 lb (103.4 kg)   SpO2 98%   BMI 31.80 kg/m  Physical Exam Constitutional:      Appearance: Normal appearance.  HENT:     Head: Normocephalic and atraumatic.  Cardiovascular:     Rate and Rhythm: Normal rate and  regular rhythm.  Pulmonary:     Effort: Pulmonary effort is normal.     Breath sounds: Normal breath sounds.  Skin:    General: Skin is warm and dry.  Neurological:     General: No focal deficit present.     Mental Status: He is alert and oriented to person, place, and time. Mental status is at baseline.  Psychiatric:        Mood and Affect: Mood normal.        Behavior: Behavior normal.        Thought Content: Thought content normal.        Judgment: Judgment normal.      Lab Results:  CBC    Component Value Date/Time   WBC 8.2 10/26/2021 1915   RBC 3.63 (L) 10/26/2021 1915   HGB 11.3 (L) 10/26/2021 1915   HGB 13.1 10/07/2020 1605   HCT 32.2 (L) 10/26/2021 1915   HCT 37.8 10/07/2020 1605   PLT 169 10/26/2021 1915   PLT 201 10/07/2020 1605   MCV 88.7 10/26/2021 1915   MCV 92 10/07/2020 1605   MCH 31.1 10/26/2021 1915   MCHC 35.1 10/26/2021 1915   RDW 14.5 10/26/2021 1915   RDW 14.4 10/07/2020 1605   LYMPHSABS 1.5 10/07/2020 1605   MONOABS 0.3 11/19/2018 1515   EOSABS 0.1 10/07/2020 1605   BASOSABS 0.0 10/07/2020 1605    BMET    Component Value Date/Time   NA 138 10/26/2021 1915   NA 141 10/07/2020 1605   K 3.1 (L) 10/26/2021 1915   CL 105 10/26/2021 1915   CO2 26 10/26/2021 1915   GLUCOSE 101 (H) 10/26/2021 1915   BUN 5 (L) 10/26/2021 1915   BUN 8 10/07/2020 1605   CREATININE 1.05 10/26/2021 1915   CALCIUM 8.7 (L) 10/26/2021 1915  GFRNONAA >60 10/26/2021 1915   GFRAA 106 06/09/2020 1107    BNP    Component Value Date/Time   BNP 25.2 10/26/2021 1915    ProBNP No results found for: "PROBNP"  Imaging: No results found.   Assessment & Plan:   COPD with emphysema (HCC) - Cough is worse. CAT score 4. He has been off Spiriva for 3 months.  - Resume Spiriva HandiHaler two puffs every morning - Continue albuterol inhaler/nebulizer every 4-6 hours as needed for breakthrough shortness of breath or wheezing   Excessive daytime sleepiness - Patient  has symptoms of excessive daytime sleepiness and restless sleep. Epworth score 15. Strong suspicion patient may have underlying sleep apnea, needs in-lab polysomnography. Reviewed risks of untreated sleep apnea and treatment options. FU 1-2 weeks after sleep study to review results and discuss treatment options further.   Chronic diastolic heart failure (HCC) - Cont prn lasix 4 times a week - Encourage compression stockings and elevating legs    Martyn Ehrich, NP 02/16/2022

## 2022-02-07 NOTE — Patient Instructions (Signed)
Recommendations:  - Please start Spiriva HandiHaler- take two puffs every morning - Use albuterol inhaler/nebulizer every 4-6 hours as needed for breakthrough shortness of breath or wheezing  Orders: - In lab sleep study  Follow-up: - 6 to 8 weeks with Beth NP to review sleep study results

## 2022-02-08 ENCOUNTER — Telehealth: Payer: Self-pay | Admitting: Pulmonary Disease

## 2022-02-08 NOTE — Telephone Encounter (Signed)
Called and spoke to [place] that I looked in patients charts and could not see anywhere that anyone called him or her yesterday or today. I doubled checked the chart and everything. She verbalized understanding. Nothing further needed

## 2022-02-16 DIAGNOSIS — J439 Emphysema, unspecified: Secondary | ICD-10-CM | POA: Insufficient documentation

## 2022-02-16 DIAGNOSIS — G4719 Other hypersomnia: Secondary | ICD-10-CM

## 2022-02-16 HISTORY — DX: Emphysema, unspecified: J43.9

## 2022-02-16 HISTORY — DX: Other hypersomnia: G47.19

## 2022-02-16 NOTE — Assessment & Plan Note (Signed)
-   Cont prn lasix 4 times a week - Encourage compression stockings and elevating legs

## 2022-02-16 NOTE — Assessment & Plan Note (Signed)
-   Cough is worse. CAT score 4. He has been off Spiriva for 3 months.  - Resume Spiriva HandiHaler two puffs every morning - Continue albuterol inhaler/nebulizer every 4-6 hours as needed for breakthrough shortness of breath or wheezing

## 2022-02-16 NOTE — Assessment & Plan Note (Addendum)
-   Patient has symptoms of excessive daytime sleepiness and restless sleep. Epworth score 15. Strong suspicion patient may have underlying sleep apnea, needs in-lab polysomnography. Reviewed risks of untreated sleep apnea and treatment options. FU 1-2 weeks after sleep study to review results and discuss treatment options further.

## 2022-02-22 ENCOUNTER — Telehealth: Payer: Self-pay | Admitting: Primary Care

## 2022-02-22 DIAGNOSIS — J029 Acute pharyngitis, unspecified: Secondary | ICD-10-CM

## 2022-02-22 MED ORDER — ALBUTEROL SULFATE HFA 108 (90 BASE) MCG/ACT IN AERS: INHALATION_SPRAY | RESPIRATORY_TRACT | 3 refills | Status: DC

## 2022-02-22 MED ORDER — SPIRIVA HANDIHALER 18 MCG IN CAPS: ORAL_CAPSULE | RESPIRATORY_TRACT | 3 refills | Status: DC

## 2022-02-22 NOTE — Telephone Encounter (Signed)
Rx for both pt's spiriva and albuterol have been sent to preferred pharmacy for pt. Attempted to call pt but unable to reach. Left a detailed message for pt to let him know that meds had been refilled. Nothing further needed.

## 2022-02-22 NOTE — Telephone Encounter (Signed)
Patient called to request medication refills for his Spiriva and his Albuterol.  He stated at his last visit the doctor said she would call it into the [facility] on [address], but they have not heard from the pharmacy.  Please call patient to verify.

## 2022-03-09 ENCOUNTER — Ambulatory Visit (HOSPITAL_BASED_OUTPATIENT_CLINIC_OR_DEPARTMENT_OTHER): Payer: Medicare Other | Attending: Primary Care | Admitting: Pulmonary Disease

## 2022-03-09 DIAGNOSIS — G4719 Other hypersomnia: Secondary | ICD-10-CM | POA: Insufficient documentation

## 2022-03-09 DIAGNOSIS — R0683 Snoring: Secondary | ICD-10-CM | POA: Insufficient documentation

## 2022-03-10 DIAGNOSIS — G4719 Other hypersomnia: Secondary | ICD-10-CM | POA: Diagnosis not present

## 2022-03-10 NOTE — Procedures (Signed)
Patient Name: Chad Moss, Chad Moss Date: 03/09/2022 Gender: Male D.O.B: November 23, 1955 Age (years): 78 Referring Provider: Geraldo Pitter NP Height (inches): 71 Interpreting Physician: Kara Mead MD, ABSM Weight (lbs): 229 RPSGT: Jorge Ny BMI: 32 MRN: [id number] Neck Size: 16.50 <br> <br> CLINICAL INFORMATION Sleep Study Type: NPSG   Indication for sleep study: COPD, Daytime Fatigue, Fatigue, Hypertension   Epworth Sleepiness Score: 12   SLEEP STUDY TECHNIQUE As per the AASM Manual for the Scoring of Sleep and Associated Events v2.3 (April 2016) with a hypopnea requiring 4% desaturations.  The channels recorded and monitored were frontal, central and occipital EEG, electrooculogram (EOG), submentalis EMG (chin), nasal and oral airflow, thoracic and abdominal wall motion, anterior tibialis EMG, snore microphone, electrocardiogram, and pulse oximetry.  MEDICATIONS Medications self-administered by patient taken the night of the study : Alfuzosin, CLONAZEPAM, TRAZODONE, PROPRANOLOL, MIRTAZAPINE, LOXAPINE, DIPHENHYDRAMINE, TRAMADOL  SLEEP ARCHITECTURE The study was initiated at 10:31:26 PM and ended at 4:32:23 AM.  Sleep onset time was 3.4 minutes and the sleep efficiency was 26.9%%. The total sleep time was 97 minutes.  Stage REM latency was 93.5 minutes.  The patient spent 7.2%% of the night in stage N1 sleep, 72.2%% in stage N2 sleep, 0.0%% in stage N3 and 20.6% in REM.  Alpha intrusion was absent.  Supine sleep was 9.79%.  RESPIRATORY PARAMETERS The overall apnea/hypopnea index (AHI) was 0.6 per hour. There were 0 total apneas, including 0 obstructive, 0 central and 0 mixed apneas. There were 1 hypopneas and 0 RERAs.  The AHI during Stage REM sleep was 3.0 per hour.  AHI while supine was 0.0 per hour.  The mean oxygen saturation was 91.4%. The minimum SpO2 during sleep was 89.0%.  soft snoring was noted during this study.  CARDIAC DATA The 2 lead EKG  demonstrated sinus rhythm. The mean heart rate was 70.3 beats per minute. Other EKG findings include: None.  LEG MOVEMENT DATA The total PLMS were 0 with a resulting PLMS index of 0.0. Associated arousal with leg movement index was 0.0 .  IMPRESSIONS - No significant obstructive sleep apnea occurred during this study (AHI = 0.6/h). - The patient had minimal or no oxygen desaturation during the study (Min O2 = 89.0%) - Only 97 minutes of TST - The patient snored with soft snoring volume. - No cardiac abnormalities were noted during this study. - Clinically significant periodic limb movements did not occur during sleep. No significant associated arousals.   DIAGNOSIS Hypersomnolence   RECOMMENDATIONS - Evaluate for other causes of hypersomnolence & nocturia - Avoid alcohol, sedatives and other CNS depressants that may worsen sleep apnea and disrupt normal sleep architecture. - Sleep hygiene should be reviewed to assess factors that may improve sleep quality. - Weight management and regular exercise should be initiated or continued if appropriate.   Kara Mead MD Board Certified in [place]

## 2022-03-12 ENCOUNTER — Emergency Department (HOSPITAL_BASED_OUTPATIENT_CLINIC_OR_DEPARTMENT_OTHER)
Admission: EM | Admit: 2022-03-12 | Discharge: 2022-03-12 | Disposition: A | Discharge status: Home / Self Care | Payer: Medicare Other | Attending: Emergency Medicine | Admitting: Emergency Medicine

## 2022-03-12 ENCOUNTER — Telehealth (HOSPITAL_BASED_OUTPATIENT_CLINIC_OR_DEPARTMENT_OTHER): Payer: Self-pay | Admitting: Emergency Medicine

## 2022-03-12 ENCOUNTER — Other Ambulatory Visit: Payer: Self-pay

## 2022-03-12 ENCOUNTER — Encounter (HOSPITAL_BASED_OUTPATIENT_CLINIC_OR_DEPARTMENT_OTHER): Payer: Self-pay | Admitting: Emergency Medicine

## 2022-03-12 DIAGNOSIS — I10 Essential (primary) hypertension: Secondary | ICD-10-CM | POA: Diagnosis not present

## 2022-03-12 DIAGNOSIS — Z79899 Other long term (current) drug therapy: Secondary | ICD-10-CM | POA: Diagnosis not present

## 2022-03-12 DIAGNOSIS — M545 Low back pain, unspecified: Secondary | ICD-10-CM

## 2022-03-12 DIAGNOSIS — Z7982 Long term (current) use of aspirin: Secondary | ICD-10-CM | POA: Insufficient documentation

## 2022-03-12 DIAGNOSIS — Z7951 Long term (current) use of inhaled steroids: Secondary | ICD-10-CM | POA: Insufficient documentation

## 2022-03-12 DIAGNOSIS — J449 Chronic obstructive pulmonary disease, unspecified: Secondary | ICD-10-CM | POA: Diagnosis not present

## 2022-03-12 DIAGNOSIS — G8929 Other chronic pain: Secondary | ICD-10-CM

## 2022-03-12 DIAGNOSIS — M25552 Pain in left hip: Secondary | ICD-10-CM | POA: Insufficient documentation

## 2022-03-12 DIAGNOSIS — M25551 Pain in right hip: Secondary | ICD-10-CM | POA: Insufficient documentation

## 2022-03-12 DIAGNOSIS — I2510 Atherosclerotic heart disease of native coronary artery without angina pectoris: Secondary | ICD-10-CM | POA: Insufficient documentation

## 2022-03-12 MED ORDER — TRAMADOL HCL 50 MG PO TABS: 50.0000 mg | ORAL_TABLET | Freq: Four times a day (QID) | ORAL | 0 refills | Status: DC | PRN

## 2022-03-12 MED ORDER — METHYLPREDNISOLONE 4 MG PO TBPK: ORAL_TABLET | ORAL | 0 refills | Status: DC

## 2022-03-12 MED ORDER — KETOROLAC TROMETHAMINE 15 MG/ML IJ SOLN
15.0000 mg | Freq: Once | INTRAMUSCULAR | Status: AC
  Administered 2022-03-12: 15 mg via INTRAMUSCULAR
  Filled 2022-03-12: qty 1
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
## 2022-03-12 NOTE — Discharge Instructions (Signed)
You are seen in the emergency department today for chronic back and hip pain.  We have given you a an injection of Toradol.  I refilled your home tramadol, and we will give you a steroid pack as well.  I recommend following up with your orthopedist and physical therapist.  Return to the ER for new or worsening symptoms.

## 2022-03-12 NOTE — Telephone Encounter (Signed)
Call to pharmacy completed. Patient had changed mind about pharmacy. Contacted pharmacy staff to ensure patient received only one prescription for medrol dose pack and tramadol. Other prescription cancelled.
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
## 2022-03-13 NOTE — Progress Notes (Signed)
PSG showed no evidence of OSA, AHI 0.6/hour. No significant oxygen desaturations. No significant periodic leg movements. He has an apt with Dr. Elsworth Soho on 03/29/22. He should keep since we re-started him on Spiriva for emphysema and we can go over sleep study in details if he has any questions

## 2022-03-21 ENCOUNTER — Ambulatory Visit: Payer: Medicare Other | Admitting: Primary Care

## 2022-03-29 ENCOUNTER — Ambulatory Visit: Payer: Medicare Other | Admitting: Pulmonary Disease

## 2022-04-18 ENCOUNTER — Ambulatory Visit: Payer: Medicare Other | Admitting: Pulmonary Disease
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
## 2022-04-25 ENCOUNTER — Ambulatory Visit: Payer: Medicare Other | Attending: Interventional Cardiology | Admitting: Interventional Cardiology

## 2022-04-25 ENCOUNTER — Encounter: Payer: Self-pay | Admitting: Interventional Cardiology

## 2022-04-25 VITALS — BP 134/76 | HR 77 | Ht 71.0 in | Wt 222.8 lb

## 2022-04-25 DIAGNOSIS — Z720 Tobacco use: Secondary | ICD-10-CM

## 2022-04-25 DIAGNOSIS — I10 Essential (primary) hypertension: Secondary | ICD-10-CM | POA: Diagnosis not present

## 2022-04-25 DIAGNOSIS — I25119 Atherosclerotic heart disease of native coronary artery with unspecified angina pectoris: Secondary | ICD-10-CM | POA: Diagnosis not present

## 2022-04-25 DIAGNOSIS — I5032 Chronic diastolic (congestive) heart failure: Secondary | ICD-10-CM | POA: Diagnosis not present

## 2022-04-25 DIAGNOSIS — E782 Mixed hyperlipidemia: Secondary | ICD-10-CM

## 2022-04-25 MED ORDER — NITROGLYCERIN 0.4 MG SL SUBL: 0.4000 mg | SUBLINGUAL_TABLET | SUBLINGUAL | 6 refills | Status: DC | PRN

## 2022-04-25 MED ORDER — POTASSIUM CHLORIDE ER 10 MEQ PO TBCR: 10.0000 meq | EXTENDED_RELEASE_TABLET | Freq: Two times a day (BID) | ORAL | 3 refills | Status: AC

## 2022-04-25 NOTE — Patient Instructions (Signed)
Medication Instructions:  Your physician recommends that you continue on your current medications as directed. Please refer to the Current Medication list given to you today.  *If you need a refill on your cardiac medications before your next appointment, please call your pharmacy*   Lab Work: Your physician recommends that you return for lab work ion 04/27/22--CBC, Liver and lipids.  This will be fasting.  The lab opens at 7:15 AM  If you have labs (blood work) drawn today and your tests are completely normal, you will receive your results only by: [facility] (if you have MyChart) OR A paper copy in the mail If you have any lab test that is abnormal or we need to change your treatment, we will call you to review the results.   Testing/Procedures: none   Follow-Up: At [facility], you and your health needs are our priority.  As part of our continuing mission to provide you with exceptional heart care, we have created designated Provider Care Teams.  These Care Teams include your primary Cardiologist (physician) and Advanced Practice Providers (APPs -  Physician Assistants and Nurse Practitioners) who all work together to provide you with the care you need, when you need it.  We recommend signing up for the patient portal called "MyChart".  Sign up information is provided on this After Visit Summary.  MyChart is used to connect with patients for Virtual Visits (Telemedicine).  Patients are able to view lab/test results, encounter notes, upcoming appointments, etc.  Non-urgent messages can be sent to your provider as well.   To learn more about what you can do with MyChart, go to [email or web address].    Your next appointment:   12 month(s)  The format for your next appointment:   In Person  Provider:   Larae Grooms, MD     Other Instructions Dr Irish Lack recommends you wear compression socks.  15-20 mm Hg. Below the knee  Important Information About  [place]

## 2022-04-27 ENCOUNTER — Ambulatory Visit: Payer: Medicare Other | Attending: Internal Medicine

## 2022-04-27 DIAGNOSIS — I10 Essential (primary) hypertension: Secondary | ICD-10-CM

## 2022-04-27 DIAGNOSIS — I25119 Atherosclerotic heart disease of native coronary artery with unspecified angina pectoris: Secondary | ICD-10-CM

## 2022-04-27 DIAGNOSIS — Z720 Tobacco use: Secondary | ICD-10-CM

## 2022-04-27 DIAGNOSIS — I5032 Chronic diastolic (congestive) heart failure: Secondary | ICD-10-CM

## 2022-04-27 DIAGNOSIS — E782 Mixed hyperlipidemia: Secondary | ICD-10-CM

## 2022-04-28 LAB — CBC
Hematocrit: 34 % — ABNORMAL LOW (ref 37.5–51.0)
Hemoglobin: 11.6 g/dL — ABNORMAL LOW (ref 13.0–17.7)
MCH: 30.1 pg (ref 26.6–33.0)
MCHC: 34.1 g/dL (ref 31.5–35.7)
MCV: 88 fL (ref 79–97)
Platelets: 215 10*3/uL (ref 150–450)
RBC: 3.86 x10E6/uL — ABNORMAL LOW (ref 4.14–5.80)
RDW: 15.3 % (ref 11.6–15.4)
WBC: 4.9 10*3/uL (ref 3.4–10.8)

## 2022-04-28 LAB — HEPATIC FUNCTION PANEL
ALT: 21 IU/L (ref 0–44)
AST: 22 IU/L (ref 0–40)
Albumin: 3.9 g/dL (ref 3.9–4.9)
Alkaline Phosphatase: 74 IU/L (ref 44–121)
Bilirubin Total: 0.3 mg/dL (ref 0.0–1.2)
Bilirubin, Direct: 0.1 mg/dL (ref 0.00–0.40)
Total Protein: 6.7 g/dL (ref 6.0–8.5)

## 2022-04-28 LAB — LIPID PANEL
Chol/HDL Ratio: 2.9 ratio (ref 0.0–5.0)
Cholesterol, Total: 97 mg/dL — ABNORMAL LOW (ref 100–199)
HDL: 33 mg/dL — ABNORMAL LOW (ref >39)
LDL Chol Calc (NIH): 40 mg/dL (ref 0–99)
Triglycerides: 138 mg/dL (ref 0–149)
VLDL Cholesterol Cal: 24 mg/dL (ref 5–40)

## 2022-05-02 ENCOUNTER — Other Ambulatory Visit: Payer: Self-pay | Admitting: Primary Care

## 2022-06-01 ENCOUNTER — Other Ambulatory Visit: Payer: Self-pay | Admitting: Internal Medicine

## 2022-06-01 ENCOUNTER — Telehealth: Payer: Self-pay | Admitting: Internal Medicine

## 2022-06-01 MED ORDER — PREDNISONE 10 MG PO TABS: ORAL_TABLET | ORAL | 0 refills | Status: AC

## 2022-06-01 MED ORDER — AZITHROMYCIN 250 MG PO TABS: 250.0000 mg | ORAL_TABLET | Freq: Every day | ORAL | 0 refills | Status: DC

## 2022-06-01 NOTE — Telephone Encounter (Signed)
Called patient and he states he is having symptoms of Cough with green mucus. Started last week.   Tried cough drops and Delsym cough medication but did not help.   Patient is asking for zpak if possible.   Thank you   Please advise

## 2022-06-01 NOTE — Telephone Encounter (Signed)
Called and spoke with patient and went over medication and he verbalized understanding. Went over pharmacy. Nothing further needed

## 2022-06-01 NOTE — Telephone Encounter (Signed)
Likely COPD exacerbation  Plan  Zpak  Please take prednisone 40 mg x1 day, then 30 mg x1 day, then 20 mg x1 day, then 10 mg x1 day, and then 5 mg x1 day and stop  Also check for COVID and if it is positive call for antiviral

## 2022-07-07 ENCOUNTER — Telehealth: Payer: Self-pay | Admitting: Pulmonary Disease

## 2022-07-07 MED ORDER — AZITHROMYCIN 250 MG PO TABS: ORAL_TABLET | ORAL | 0 refills | Status: DC

## 2022-07-07 MED ORDER — PREDNISONE 10 MG PO TABS: ORAL_TABLET | ORAL | 0 refills | Status: DC

## 2022-07-07 NOTE — Telephone Encounter (Signed)
Spoke with pt's caretaker (pt heard in back ground and gave permission) who states pt has a productive cough (yellow/ thick). Pt denies fever/ chills/ GI upset/ SOB or wheezing. Pt is requesting Z-Pack be called in for him. Dr. Chase Caller please advise.

## 2022-07-07 NOTE — Telephone Encounter (Signed)
Spoke with pt's caregiver and reviewed Dr. Golden Pop recommendations. Brenadette state understanding. Medication orders placed.  Nothing further needed at this time.

## 2022-07-07 NOTE — Telephone Encounter (Signed)
  Patient probably has COPD exacerbation  Plan - He should do a COVID antigen test and if this is positive he should call us. -Please ensure he has been immunized against influenza and RSV once he recovers from this -If he is getting worse he should go to the ER -Can call in Z-Pak but also call in prednisone  - Please take prednisone 40 mg x1 day, then 30 mg x1 day, then 20 mg x1 day, then 10 mg x1 day, and then 5 mg x1 day and stop    Allergies  Allergen Reactions   Benztropine Anaphylaxis   Codeine Anaphylaxis   Meperidine Swelling   Cyclobenzaprine Other (See Comments)    Pt. Does not remember   Penicillins Rash    Has patient had a PCN reaction causing immediate rash, facial/tongue/throat swelling, SOB or lightheadedness with hypotension: Yes Has patient had a PCN reaction causing severe rash involving mucus membranes or skin necrosis: No Has patient had a PCN reaction that required hospitalization No Has patient had a PCN reaction occurring within the last 10 years: No If all of the above answers are "NO", then may proceed with Cephalosporin use.    Sulfamethoxazole-Trimethoprim     REACTION: hives   Sulfonamide Derivatives     REACTION: hives   Immunization History  Administered Date(s) Administered   Fluad Quad(high Dose 65+) 06/09/2020   Influenza Split 04/06/2011, 03/12/2012, 03/31/2015   Influenza Whole 05/10/2009   Influenza,inj,Quad PF,6+ Mos 03/04/2013, 04/02/2017, 03/28/2018   Influenza-Unspecified 04/19/2014   PFIZER(Purple Top)SARS-COV-2 Vaccination 11/20/2018, 12/17/2019   Pneumococcal Conjugate-13 01/27/2009   Pneumococcal Polysaccharide-23 06/19/2008, 06/09/2020   Tdap 12/27/2017

## 2022-07-08 ENCOUNTER — Other Ambulatory Visit: Payer: Self-pay

## 2022-07-08 ENCOUNTER — Encounter (HOSPITAL_BASED_OUTPATIENT_CLINIC_OR_DEPARTMENT_OTHER): Payer: Self-pay | Admitting: Emergency Medicine

## 2022-07-08 ENCOUNTER — Emergency Department (HOSPITAL_BASED_OUTPATIENT_CLINIC_OR_DEPARTMENT_OTHER): Payer: 59

## 2022-07-08 ENCOUNTER — Emergency Department (HOSPITAL_BASED_OUTPATIENT_CLINIC_OR_DEPARTMENT_OTHER)
Admission: EM | Admit: 2022-07-08 | Discharge: 2022-07-08 | Disposition: A | Discharge status: Home / Self Care | Payer: 59 | Attending: Emergency Medicine | Admitting: Emergency Medicine

## 2022-07-08 DIAGNOSIS — Z7951 Long term (current) use of inhaled steroids: Secondary | ICD-10-CM | POA: Insufficient documentation

## 2022-07-08 DIAGNOSIS — R059 Cough, unspecified: Secondary | ICD-10-CM | POA: Diagnosis present

## 2022-07-08 DIAGNOSIS — Z1152 Encounter for screening for COVID-19: Secondary | ICD-10-CM | POA: Diagnosis not present

## 2022-07-08 DIAGNOSIS — Z7982 Long term (current) use of aspirin: Secondary | ICD-10-CM | POA: Insufficient documentation

## 2022-07-08 DIAGNOSIS — Z79899 Other long term (current) drug therapy: Secondary | ICD-10-CM | POA: Insufficient documentation

## 2022-07-08 DIAGNOSIS — Z87891 Personal history of nicotine dependence: Secondary | ICD-10-CM | POA: Diagnosis not present

## 2022-07-08 DIAGNOSIS — J449 Chronic obstructive pulmonary disease, unspecified: Secondary | ICD-10-CM | POA: Diagnosis not present

## 2022-07-08 DIAGNOSIS — Z86718 Personal history of other venous thrombosis and embolism: Secondary | ICD-10-CM | POA: Diagnosis not present

## 2022-07-08 DIAGNOSIS — J069 Acute upper respiratory infection, unspecified: Secondary | ICD-10-CM | POA: Insufficient documentation

## 2022-07-08 DIAGNOSIS — I10 Essential (primary) hypertension: Secondary | ICD-10-CM | POA: Diagnosis not present

## 2022-07-08 DIAGNOSIS — Z8679 Personal history of other diseases of the circulatory system: Secondary | ICD-10-CM | POA: Insufficient documentation

## 2022-07-08 LAB — RESP PANEL BY RT-PCR (RSV, FLU A&B, COVID)  RVPGX2
Influenza A by PCR: NEGATIVE
Influenza B by PCR: NEGATIVE
Resp Syncytial Virus by PCR: NEGATIVE
SARS Coronavirus 2 by RT PCR: NEGATIVE

## 2022-07-08 NOTE — ED Provider Notes (Signed)
[facility] Provider Note  CSN: [id number] Arrival date & time: 07/08/22 1303  Chief Complaint(s) Cough  HPI Kaizer Dissinger Majchrzak is a 67 y.o. male with history of COPD, Crohn's disease, coronary artery disease, prior DVT presenting to the emergency department with cough.  Patient reports cough for the past 2 to 3 days.  He reports he has some yellow phlegm.  Otherwise denies fevers or chills, runny nose or sore throat, chest pain, shortness of breath, abdominal pain, body aches.  His wife has similar symptoms.  He reports that he is not needed to use his inhalers more than often.  He called his pulmonologist who prescribed him prednisone and a Z-Pak.  He primarily wants to get tested for COVID.   Past Medical History Past Medical History:  Diagnosis Date   Allergic rhinitis    Anxiety    Atelectasis    CAD (coronary artery disease), native coronary artery    9/18 PCI/DES x1 to mLCx, mild diffuse nonobstructive disease, EF 55% on Lv gram   Chronic bronchitis (HCC)    COPD (chronic obstructive pulmonary disease) (HCC)    Crohn's disease (HCC)    Depression    DVT (deep venous thrombosis) (HCC)    RLE X 2   Dysphagia    Dyspnea    Enlarged prostate    GERD (gastroesophageal reflux disease)    Glaucoma, both eyes    Heart murmur    History of stomach ulcers 1980s   Hyperlipidemia    Hypertension    "off RX for years now cause of coughing w/Lisinopril" (03/13/2017)   IBS (irritable bowel syndrome)    Paranoid schizophrenia (HCC)    Peripheral vascular disease (HCC)    Pneumonia 1990s   Pre-diabetes    "one time" (03/13/2017)   Stroke (HCC) 04/2016   "eye stroke; left eye" (03/13/2017)   Syncopal episodes    Patient Active Problem List   Diagnosis Date Noted   COPD with emphysema (HCC) 02/16/2022   Excessive daytime sleepiness 02/16/2022   Chronic diastolic heart failure (HCC) 11/08/2020   COVID-19 virus detected 11/25/2018    Diarrhea 11/19/2018   Fever in adult 11/19/2018   Inadequate community support 11/19/2018   Edema 03/09/2018   Abnormal weight gain 03/09/2018   Essential hypertension    Coronary artery disease involving native coronary artery of native heart with angina pectoris (HCC) 03/13/2017   Coronary artery calcification seen on CAT scan 01/12/2017   Cough 08/09/2015   Flu-like symptoms 08/09/2015   Acute sinusitis 08/09/2015   AP (abdominal pain) 07/09/2014   Other malaise and fatigue 09/06/2013   Encounter for screening for lung cancer 10/09/2012   Chronic cough 11/19/2011   Tobacco abuse 04/06/2011   Mixed hyperlipidemia 01/27/2009   HYPERTENSION 01/27/2009   PERIPHERAL VASCULAR DISEASE 01/27/2009   Allergic rhinitis 01/27/2009   DIZZINESS, CHRONIC 01/27/2009   SHORTNESS OF BREATH (SOB) 01/27/2009   Crohn's disease without complication (HCC) 01/26/2009   Home Medication(s) Prior to Admission medications   Medication Sig Start Date End Date Taking? Authorizing Provider  albuterol (PROVENTIL) (2.5 MG/3ML) 0.083% nebulizer solution Take 3 mLs (2.5 mg total) by nebulization every 6 (six) hours as needed for wheezing or shortness of breath. 07/18/18   Kalman Shan, MD  albuterol (VENTOLIN HFA) 108 (90 Base) MCG/ACT inhaler INHALE 2 PUFFS BY MOUTH INTO THE LUNGS EVERY 6 HOURS AS NEEDED WHEEZING OR SHORTNESS OF BREATH 02/22/22   Glenford Bayley, NP  alfuzosin (  UROXATRAL) 10 MG 24 hr tablet Take 10 mg by mouth at bedtime.    [provider]  amLODipine (NORVASC) 5 MG tablet TAKE 1 TABLET(5 MG) BY MOUTH DAILY 10/21/21   Swinyer, Zachary George, NP  aspirin EC 81 MG tablet Take 81 mg by mouth daily.    [provider]  azithromycin (ZITHROMAX Z-PAK) 250 MG tablet Take 2 tabs today, then 1 tab until gone 07/07/22   Kalman Shan, MD  bimatoprost (LUMIGAN) 0.01 % SOLN Place 1 drop into the left eye at bedtime.    [provider]  brimonidine (ALPHAGAN) 0.15 % ophthalmic  solution Place 1 drop into the left eye 2 (two) times daily.    [provider]  cholestyramine Lanetta Inch) 4 G packet Take 1 packet by mouth daily.    [provider]  clonazePAM (KLONOPIN) 1 MG tablet Take 1 mg by mouth at bedtime. Anxiety    [provider]  clopidogrel (PLAVIX) 75 MG tablet Take 1 tablet (75 mg total) by mouth daily. 10/21/21   Swinyer, Zachary George, NP  dicyclomine (BENTYL) 10 MG capsule Take 10 mg by mouth 4 (four) times daily -  before meals and at bedtime.    [provider]  diphenhydrAMINE (BENADRYL) 25 MG tablet Take 50 mg by mouth at bedtime.     [provider]  doxycycline (VIBRA-TABS) 100 MG tablet Take 1 tablet (100 mg total) by mouth 2 (two) times daily. 12/01/20   Kalman Shan, MD  finasteride (PROSCAR) 5 MG tablet Take 5 mg by mouth daily.    [provider]  fluticasone (FLONASE) 50 MCG/ACT nasal spray SHAKE LIQUID AND USE 1 SPRAY IN [facility] NOSTRIL DAILY 10/27/19   Glenford Bayley, NP  furosemide (LASIX) 40 MG tablet Take 1 tablet 4 times a week. 10/21/21   Swinyer, Zachary George, NP  isosorbide mononitrate (IMDUR) 30 MG 24 hr tablet Take 1 tablet (30 mg total) by mouth daily. 10/21/21 10/22/22  Swinyer, Zachary George, NP  lidocaine (LIDODERM) 5 % Place 1 patch onto the skin daily. Remove & Discard patch within 12 hours or as directed by MD 05/04/21   Palumbo, April, MD  loratadine (CLARITIN) 10 MG tablet TAKE 1 TABLET(10 MG) BY MOUTH DAILY 11/30/20   Arnette Felts, FNP  loxapine (LOXITANE) 10 MG capsule Take 10 mg by mouth 2 (two) times daily. 1 tab in AM and 2 tabs in PM    [provider]  mercaptopurine (PURINETHOL) 50 MG tablet Take 75 mg by mouth daily. 1 tablet and a half tablet Give on an empty stomach 1 hour before or 2 hours after meals. Caution: Chemotherapy.    [provider]  mesalamine (PENTASA) 250 MG CR capsule Take 1,000 mg by mouth 4 (four) times daily.     [provider]   methocarbamol (ROBAXIN) 500 MG tablet Take 1 tablet (500 mg total) by mouth 2 (two) times daily. 05/04/21   Palumbo, April, MD  methylPREDNISolone (MEDROL DOSEPAK) 4 MG TBPK tablet Take per package instructions 03/12/22   Roemhildt, Lorin T, PA-C  mirtazapine (REMERON) 30 MG tablet Take 45 mg by mouth at bedtime. 1.5 tablets by mouth daily 01/08/17   [provider]  nitroGLYCERIN (NITROSTAT) 0.4 MG SL tablet Place 1 tablet (0.4 mg total) under the tongue every 5 (five) minutes x 3 doses as needed for chest pain. 04/25/22   Corky Crafts, MD  ondansetron (ZOFRAN) 8 MG tablet Take 8 mg by mouth every  8 (eight) hours as needed for nausea or vomiting. 04/11/22   [provider]  pantoprazole (PROTONIX) 40 MG tablet TAKE 1 TABLET(40 MG) BY MOUTH TWICE DAILY 02/07/22   Jettie Booze, MD  phenol (CHLORASEPTIC GARGLE) 1.4 % LIQD Use as directed 1 spray in the mouth or throat as needed for throat irritation / pain. 09/06/19   Couture, Cortni S, PA-C  potassium chloride (KLOR-CON) 10 MEQ tablet Take 1 tablet (10 mEq total) by mouth 2 (two) times daily. 04/25/22   Jettie Booze, MD  predniSONE (DELTASONE) 10 MG tablet Take 4 tabs x1 day, 3 tabs x1 day, 2 tabs x1 day, 1 tab x1 day and then 1/2 tab x1 day. 07/07/22   Brand Males, MD  propranolol (INDERAL) 10 MG tablet 10 mg daily at 6 (six) AM.    [provider]  rosuvastatin (CRESTOR) 20 MG tablet Take 1 tablet (20 mg total) by mouth daily. 11/21/21   Swinyer, Lanice Schwab, NP  solifenacin (VESICARE) 5 MG tablet Take 5 mg by mouth daily. 01/29/22   [provider]  SPIRIVA HANDIHALER 18 MCG inhalation capsule INHALE THE CONTENTS OF 1 CAPSULE VIA INHALATION DEVICE DAILY 05/02/22   Martyn Ehrich, NP  tamsulosin (FLOMAX) 0.4 MG CAPS capsule Take 0.4 mg by mouth at bedtime. 12/08/19   [provider]  timolol (TIMOPTIC) 0.5 % ophthalmic solution Place 1 drop into the left eye 2 (two) times daily.  07/21/16   [provider]  traMADol (ULTRAM) 50 MG tablet Take 1 tablet (50 mg total) by mouth every 6 (six) hours as needed. 03/12/22   Roemhildt, Lorin T, PA-C  traZODone (DESYREL) 50 MG tablet Take 150 mg by mouth at bedtime.    [provider]                                                                                                                                    Past Surgical History Past Surgical History:  Procedure Laterality Date   [place]; ?1989; 1996   "part of my ileum removed"   CORONARY ANGIOPLASTY WITH STENT PLACEMENT  03/13/2017   CORONARY STENT INTERVENTION N/A 03/13/2017   Procedure: CORONARY STENT INTERVENTION;  Surgeon: Jettie Booze, MD;  Location: [facility];  Service: Cardiovascular;  Laterality: N/A;   EYE SURGERY Right    "laser; for glaucoma" (03/13/2017)   FASCIOTOMY Right    HERNIA REPAIR     LAPAROSCOPIC CHOLECYSTECTOMY     LEFT HEART CATH AND CORONARY ANGIOGRAPHY N/A 03/13/2017   Procedure: LEFT HEART CATH AND CORONARY ANGIOGRAPHY;  Surgeon: Jettie Booze, MD;  Location: [facility];  Service: Cardiovascular;  Laterality: N/A;   PENILE PROSTHESIS IMPLANT  01/2011   Archie Endo 02/01/2011   PERIPHERAL VASCULAR CATHETERIZATION Right 1999   "had blood clots in my leg; went in to open  them up; dye went into leg; ended up having a fasiotomy"   TONSILLECTOMY     Family History Family History  Problem Relation Age of Onset   Hypertension Mother    Heart disease Mother    Hypertension Father    Heart disease Father    Lung cancer Brother        2006    Social History Social History   Tobacco Use   Smoking status: Former    Packs/day: 2.00    Years: 50.00    Total pack years: 100.00    Types: Cigarettes    Start date: 11/18/1968   Smokeless tobacco: Never   Tobacco comments:    currently smoking 2ppd  Vaping Use   Vaping Use: Never used  Substance Use Topics    Alcohol use: No    Alcohol/week: 0.0 standard drinks of alcohol   Drug use: No   Allergies Benztropine, Codeine, Meperidine, Cyclobenzaprine, Penicillins, Sulfamethoxazole-trimethoprim, and Sulfonamide derivatives  Review of Systems Review of Systems  All other systems reviewed and are negative.   Physical Exam Vital Signs  I have reviewed the triage vital signs BP (!) 141/94 (BP Location: Right Arm)   Pulse 99   Temp 97.7 F (36.5 C) (Oral)   Resp 20   Wt 99.8 kg   SpO2 100%   BMI 30.68 kg/m  Physical Exam Vitals and nursing note reviewed.  Constitutional:      General: He is not in acute distress.    Appearance: Normal appearance.  HENT:     Mouth/Throat:     Mouth: Mucous membranes are moist.  Eyes:     Conjunctiva/sclera: Conjunctivae normal.  Cardiovascular:     Rate and Rhythm: Normal rate and regular rhythm.  Pulmonary:     Effort: Pulmonary effort is normal. No respiratory distress.     Breath sounds: Normal breath sounds.  Abdominal:     General: Abdomen is flat.     Palpations: Abdomen is soft.     Tenderness: There is no abdominal tenderness.  Musculoskeletal:     Right lower leg: No edema.     Left lower leg: No edema.  Skin:    General: Skin is warm and dry.     Capillary Refill: Capillary refill takes less than 2 seconds.  Neurological:     Mental Status: He is alert and oriented to person, place, and time. Mental status is at baseline.  Psychiatric:        Mood and Affect: Mood normal.        Behavior: Behavior normal.     ED Results and Treatments Labs (all labs ordered are listed, but only abnormal results are displayed) Labs Reviewed  RESP PANEL BY RT-PCR (RSV, FLU A&B, COVID)  RVPGX2                                                                                                                          Radiology DG Chest 2  View  Result Date: 07/08/2022 CLINICAL DATA:  Cough. EXAM: CHEST - 2 VIEW COMPARISON:  10/26/2021 FINDINGS:  Lungs are adequately inflated without focal airspace consolidation or effusion. Cardiomediastinal silhouette and remainder of the exam is unchanged. IMPRESSION: No active cardiopulmonary disease. Electronically Signed   By: Elberta Fortis M.D.   On: 07/08/2022 16:18    Pertinent labs & imaging results that were available during my care of the patient were reviewed by me and considered in my medical decision making (see MDM for details).  Medications Ordered in ED Medications - No data to display                                                                                                                                   Procedures Procedures  (including critical care time)  Medical Decision Making / ED Course   MDM:  67 year old male presenting to the emergency department with cough.  Patient well-appearing, physical exam reassuring, pulmonary exam with no wheezes or focal lung sounds.  Well-appearing with no fever.  Suspect likely viral URI.  Patient already prescribed prednisone and Z-Pak by his pulmonologist.  Advised that he should continue this.  COVID test obtained in the emergency department negative for COVID, flu, RSV.  Very low concern for pneumonia given clinical well appearance, negative chest x-ray and clear lungs.  No wheezing to suggest COPD exacerbation. Will discharge patient to home. All questions answered. Patient comfortable with plan of discharge. Return precautions discussed with patient and specified on the after visit summary.       Additional history obtained: -Additional history obtained from spouse -External records from outside source obtained and reviewed including: Chart review including previous notes, labs, imaging, consultation notes including telephone encounter with pulmonologist yesterday   Lab Tests: -I ordered, reviewed, and interpreted labs.   The pertinent results include:   Labs Reviewed  RESP PANEL BY RT-PCR (RSV, FLU A&B, COVID)  [id number]     Notable for negative flu/covid/RSV  Imaging Studies ordered: I ordered imaging studies including CXR On my interpretation imaging demonstrates no acute process I independently visualized and interpreted imaging. I agree with the radiologist interpretation   Medicines ordered and prescription drug management: No orders of the defined types were placed in this encounter.   -I have reviewed the patients home medicines and have made adjustments as needed   Social Determinants of Health:  Diagnosis or treatment significantly limited by social determinants of health: former smoker and obesity   Reevaluation: After the interventions noted above, I reevaluated the patient and found that they have improved  Co morbidities that complicate the patient evaluation  Past Medical History:  Diagnosis Date   Allergic rhinitis    Anxiety    Atelectasis    CAD (coronary artery disease), native coronary artery    9/18 PCI/DES x1 to mLCx, mild diffuse nonobstructive disease, EF 55% on Lv gram   Chronic bronchitis (HCC)  COPD (chronic obstructive pulmonary disease) (HCC)    Crohn's disease (HCC)    Depression    DVT (deep venous thrombosis) (HCC)    RLE X 2   Dysphagia    Dyspnea    Enlarged prostate    GERD (gastroesophageal reflux disease)    Glaucoma, both eyes    Heart murmur    History of stomach ulcers 1980s   Hyperlipidemia    Hypertension    "off RX for years now cause of coughing w/Lisinopril" (03/13/2017)   IBS (irritable bowel syndrome)    Paranoid schizophrenia ([place])    Peripheral vascular disease ([place])    Pneumonia 1990s   Pre-diabetes    "one time" (03/13/2017)   Stroke ([place]) 04/2016   "eye stroke; left eye" (03/13/2017)   Syncopal episodes       Dispostion: Disposition decision including need for hospitalization was considered, and patient discharged from emergency department.    Final Clinical Impression(s) / ED Diagnoses Final diagnoses:  Viral URI  with cough     This chart was dictated using voice recognition software.  Despite best efforts to proofread,  errors can occur which can change the documentation meaning.    Cristie Hem, MD 07/08/22 1739

## 2022-07-08 NOTE — ED Triage Notes (Signed)
Pt arrives pov, steady gait with c/o cough, request covid test. Currently taking zpac

## 2022-07-08 NOTE — ED Notes (Signed)
Pt discharged with wife. He remains at bedside with his wife who is also a patient here at this time.

## 2022-07-20 ENCOUNTER — Telehealth: Payer: Self-pay | Admitting: Interventional Cardiology

## 2022-07-20 ENCOUNTER — Telehealth: Payer: Self-pay | Admitting: *Deleted

## 2022-07-20 NOTE — Telephone Encounter (Signed)
Patient's daughter on the line requesting to speak with Dr. Irish Lack or nurse

## 2022-07-20 NOTE — Telephone Encounter (Signed)
   Pre-operative Risk Assessment    Patient Name: Chad Moss [facility]  DOB: 1955-10-12 MRN: [id number]      Request for Surgical Clearance    Procedure:   COLONOSCOPY  Date of Surgery:  Clearance TBD                                 Surgeon:  NOT LISTED  Surgeon's Group or Practice Name:  [facility] Phone number:  [phone] ATTN: Scharlene Gloss RN Fax number:  [phone]   Type of Clearance Requested:   - Medical ; PLAVIX x 5 DAYS PRIOR   Type of Anesthesia:  Not Indicated (PROPOFOL?)   Additional requests/questions:    [name]   07/20/2022, 5:15 PM

## 2022-07-20 NOTE — Telephone Encounter (Signed)
I spoke with patient's caregiver Mliss Sax). She reports patient needs to have a colonoscopy and procedure can't be done until doctor hears from Dr Irish Lack regarding patient's Plavix. I explained need for cardiac clearance.  Mliss Sax will contact office of provider doing colonoscopy and ask for cardiac clearance request to be sent to Dr Irish Lack.

## 2022-07-21 ENCOUNTER — Telehealth: Payer: Self-pay | Admitting: *Deleted

## 2022-07-21 NOTE — Telephone Encounter (Signed)
S/w the pt and his wife and pt has been scheduled for tele pre op appt 07/28/22 @ 3 pm. Med rec and consent are done.Chad Moss

## 2022-07-21 NOTE — Telephone Encounter (Signed)
S/w the pt and his wife and pt has been scheduled for tele pre op appt 07/28/22 @ 3 pm. Med rec and consent are done..     Patient Consent for Virtual Visit        [name] has provided verbal consent on 07/21/2022 for a virtual visit (video or telephone).   CONSENT FOR VIRTUAL VISIT FOR:  Chad Moss  By participating in this virtual visit I agree to the following:  I hereby voluntarily request, consent and authorize [facility] and its employed or contracted physicians, physician assistants, nurse practitioners or other licensed health care professionals (the Practitioner), to provide me with telemedicine health care services (the "Services") as deemed necessary by the treating Practitioner. I acknowledge and consent to receive the Services by the Practitioner via telemedicine. I understand that the telemedicine visit will involve communicating with the Practitioner through live audiovisual communication technology and the disclosure of certain medical information by electronic transmission. I acknowledge that I have been given the opportunity to request an in-person assessment or other available alternative prior to the telemedicine visit and am voluntarily participating in the telemedicine visit.  I understand that I have the right to withhold or withdraw my consent to the use of telemedicine in the course of my care at any time, without affecting my right to future care or treatment, and that the Practitioner or I may terminate the telemedicine visit at any time. I understand that I have the right to inspect all information obtained and/or recorded in the course of the telemedicine visit and may receive copies of available information for a reasonable fee.  I understand that some of the potential risks of receiving the Services via telemedicine include:  Delay or interruption in medical evaluation due to technological equipment failure or disruption; Information transmitted  may not be sufficient (e.g. poor resolution of images) to allow for appropriate medical decision making by the Practitioner; and/or  In rare instances, security protocols could fail, causing a breach of personal health information.  Furthermore, I acknowledge that it is my responsibility to provide information about my medical history, conditions and care that is complete and accurate to the best of my ability. I acknowledge that Practitioner's advice, recommendations, and/or decision may be based on factors not within their control, such as incomplete or inaccurate data provided by me or distortions of diagnostic images or specimens that may result from electronic transmissions. I understand that the practice of medicine is not an exact science and that Practitioner makes no warranties or guarantees regarding treatment outcomes. I acknowledge that a copy of this consent can be made available to me via my patient portal ([facility]), or I can request a printed copy by calling the office of [place] [facility].    I understand that my insurance will be billed for this visit.   I have read or had this consent read to me. I understand the contents of this consent, which adequately explains the benefits and risks of the Services being provided via telemedicine.  I have been provided ample opportunity to ask questions regarding this consent and the Services and have had my questions answered to my satisfaction. I give my informed consent for the services to be provided through the use of telemedicine in my medical care

## 2022-07-21 NOTE — Telephone Encounter (Signed)
Please set up virtual telephone visit for cardiac clearance.  Last PCI was in 2018, as long as the patient is stable, may hold Plavix for 5 days prior to GI procedure.  Will need to focus on his breathing.  According to Dr. Hassell Done last note in November, he could not lay down to sleep, he has to sit up in recliner.  Apparently that is his baseline.  He also has dyspnea on exertion.  Will need to make sure his breathing is stable.

## 2022-07-27 NOTE — Progress Notes (Signed)
Error

## 2022-07-28 ENCOUNTER — Ambulatory Visit: Payer: 59 | Attending: Cardiology

## 2022-07-28 DIAGNOSIS — Z01810 Encounter for preprocedural cardiovascular examination: Secondary | ICD-10-CM

## 2022-08-02 ENCOUNTER — Telehealth: Payer: Self-pay | Admitting: Interventional Cardiology

## 2022-08-02 NOTE — Telephone Encounter (Signed)
Spoke with Mliss Sax, ok per DPR, and she states the patient has an appt on Friday with Dr Irish Lack and wants to know if she gets the order from the GI doctor if we can do labs here. I advised her to have the order faxed today as well as write down the labs and and dx's. She voiced understanding.

## 2022-08-02 NOTE — Telephone Encounter (Signed)
Caller stated patient will need a blood draw per his GI doctor as he takes mercaptopurine (PURINETHOL) 50 MG tablet and has not had his blood drawn since last year. Caller stated patient will need orders to have blood drawn and has been unable to coordinate this with patient's GI doctor.  Caller would like a call back to discuss next steps.  Patient scheduled for in-office visit on 2/16 at 11:30.

## 2022-08-04 ENCOUNTER — Ambulatory Visit: Payer: 59 | Attending: Interventional Cardiology | Admitting: Interventional Cardiology

## 2022-08-04 ENCOUNTER — Encounter: Payer: Self-pay | Admitting: Interventional Cardiology

## 2022-08-04 VITALS — BP 124/64 | HR 77 | Ht 71.0 in | Wt 209.2 lb

## 2022-08-04 DIAGNOSIS — I10 Essential (primary) hypertension: Secondary | ICD-10-CM | POA: Diagnosis not present

## 2022-08-04 DIAGNOSIS — I5032 Chronic diastolic (congestive) heart failure: Secondary | ICD-10-CM

## 2022-08-04 DIAGNOSIS — Z720 Tobacco use: Secondary | ICD-10-CM

## 2022-08-04 DIAGNOSIS — I25119 Atherosclerotic heart disease of native coronary artery with unspecified angina pectoris: Secondary | ICD-10-CM | POA: Diagnosis not present

## 2022-08-04 DIAGNOSIS — E782 Mixed hyperlipidemia: Secondary | ICD-10-CM

## 2022-08-04 NOTE — Patient Instructions (Signed)
Medication Instructions:  Your physician recommends that you continue on your current medications as directed. Please refer to the Current Medication list given to you today.  *If you need a refill on your cardiac medications before your next appointment, please call your pharmacy*   Lab Work: Lab work to be done today--CBC and CMET If you have labs (blood work) drawn today and your tests are completely normal, you will receive your results only by: [facility] (if you have MyChart) OR A paper copy in the mail If you have any lab test that is abnormal or we need to change your treatment, we will call you to review the results.   Testing/Procedures: none   Follow-Up: At [facility], you and your health needs are our priority.  As part of our continuing mission to provide you with exceptional heart care, we have created designated Provider Care Teams.  These Care Teams include your primary Cardiologist (physician) and Advanced Practice Providers (APPs -  Physician Assistants and Nurse Practitioners) who all work together to provide you with the care you need, when you need it.  We recommend signing up for the patient portal called "MyChart".  Sign up information is provided on this After Visit Summary.  MyChart is used to connect with patients for Virtual Visits (Telemedicine).  Patients are able to view lab/test results, encounter notes, upcoming appointments, etc.  Non-urgent messages can be sent to your provider as well.   To learn more about what you can do with MyChart, go to [email or web address].    Your next appointment:   March 05, 2023 at 3:20  Provider:   Larae Grooms, MD     Other Instructions
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
## 2022-08-05 LAB — COMPREHENSIVE METABOLIC PANEL
ALT: 19 IU/L (ref 0–44)
AST: 25 IU/L (ref 0–40)
Albumin/Globulin Ratio: 1.2 (ref 1.2–2.2)
Albumin: 3.8 g/dL — ABNORMAL LOW (ref 3.9–4.9)
Alkaline Phosphatase: 69 IU/L (ref 44–121)
BUN/Creatinine Ratio: 7 — ABNORMAL LOW (ref 10–24)
BUN: 8 mg/dL (ref 8–27)
Bilirubin Total: 0.3 mg/dL (ref 0.0–1.2)
CO2: 24 mmol/L (ref 20–29)
Calcium: 9.1 mg/dL (ref 8.6–10.2)
Chloride: 99 mmol/L (ref 96–106)
Creatinine, Ser: 1.11 mg/dL (ref 0.76–1.27)
Globulin, Total: 3.3 g/dL (ref 1.5–4.5)
Glucose: 101 mg/dL — ABNORMAL HIGH (ref 70–99)
Potassium: 3.8 mmol/L (ref 3.5–5.2)
Sodium: 138 mmol/L (ref 134–144)
Total Protein: 7.1 g/dL (ref 6.0–8.5)
eGFR: 72 mL/min/{1.73_m2} (ref >59)

## 2022-08-05 LAB — CBC
Hematocrit: 35.7 % — ABNORMAL LOW (ref 37.5–51.0)
Hemoglobin: 11.7 g/dL — ABNORMAL LOW (ref 13.0–17.7)
MCH: 30 pg (ref 26.6–33.0)
MCHC: 32.8 g/dL (ref 31.5–35.7)
MCV: 92 fL (ref 79–97)
Platelets: 224 10*3/uL (ref 150–450)
RBC: 3.9 x10E6/uL — ABNORMAL LOW (ref 4.14–5.80)
RDW: 14.9 % (ref 11.6–15.4)
WBC: 7.5 10*3/uL (ref 3.4–10.8)

## 2022-08-06 ENCOUNTER — Other Ambulatory Visit: Payer: Self-pay | Admitting: Interventional Cardiology

## 2022-08-19 ENCOUNTER — Emergency Department (HOSPITAL_BASED_OUTPATIENT_CLINIC_OR_DEPARTMENT_OTHER): Payer: 59

## 2022-08-19 ENCOUNTER — Emergency Department (HOSPITAL_BASED_OUTPATIENT_CLINIC_OR_DEPARTMENT_OTHER)
Admission: EM | Admit: 2022-08-19 | Discharge: 2022-08-19 | Disposition: A | Discharge status: Home / Self Care | Payer: 59 | Attending: Emergency Medicine | Admitting: Emergency Medicine

## 2022-08-19 ENCOUNTER — Other Ambulatory Visit: Payer: Self-pay

## 2022-08-19 ENCOUNTER — Encounter (HOSPITAL_BASED_OUTPATIENT_CLINIC_OR_DEPARTMENT_OTHER): Payer: Self-pay

## 2022-08-19 DIAGNOSIS — K5010 Crohn's disease of large intestine without complications: Secondary | ICD-10-CM | POA: Insufficient documentation

## 2022-08-19 DIAGNOSIS — Z7982 Long term (current) use of aspirin: Secondary | ICD-10-CM | POA: Diagnosis not present

## 2022-08-19 DIAGNOSIS — R197 Diarrhea, unspecified: Secondary | ICD-10-CM | POA: Diagnosis present

## 2022-08-19 LAB — COMPREHENSIVE METABOLIC PANEL
ALT: 19 U/L (ref 0–44)
AST: 30 U/L (ref 15–41)
Albumin: 3.8 g/dL (ref 3.5–5.0)
Alkaline Phosphatase: 58 U/L (ref 38–126)
Anion gap: 8 (ref 5–15)
BUN: 7 mg/dL — ABNORMAL LOW (ref 8–23)
CO2: 28 mmol/L (ref 22–32)
Calcium: 8.4 mg/dL — ABNORMAL LOW (ref 8.9–10.3)
Chloride: 99 mmol/L (ref 98–111)
Creatinine, Ser: 1.24 mg/dL (ref 0.61–1.24)
GFR, Estimated: >60 mL/min (ref >60)
Glucose, Bld: 107 mg/dL — ABNORMAL HIGH (ref 70–99)
Potassium: 2.8 mmol/L — ABNORMAL LOW (ref 3.5–5.1)
Sodium: 135 mmol/L (ref 135–145)
Total Bilirubin: 0.7 mg/dL (ref 0.3–1.2)
Total Protein: 8 g/dL (ref 6.5–8.1)

## 2022-08-19 LAB — CBC
HCT: 33.4 % — ABNORMAL LOW (ref 39.0–52.0)
Hemoglobin: 11.9 g/dL — ABNORMAL LOW (ref 13.0–17.0)
MCH: 31.2 pg (ref 26.0–34.0)
MCHC: 35.6 g/dL (ref 30.0–36.0)
MCV: 87.4 fL (ref 80.0–100.0)
Platelets: 236 10*3/uL (ref 150–400)
RBC: 3.82 MIL/uL — ABNORMAL LOW (ref 4.22–5.81)
RDW: 13.7 % (ref 11.5–15.5)
WBC: 7.3 10*3/uL (ref 4.0–10.5)
nRBC: 0 % (ref 0.0–0.2)

## 2022-08-19 LAB — URINALYSIS, ROUTINE W REFLEX MICROSCOPIC
Bilirubin Urine: NEGATIVE
Glucose, UA: NEGATIVE mg/dL
Hgb urine dipstick: NEGATIVE
Ketones, ur: NEGATIVE mg/dL
Leukocytes,Ua: NEGATIVE
Nitrite: NEGATIVE
Protein, ur: NEGATIVE mg/dL
Specific Gravity, Urine: 1.02 (ref 1.005–1.030)
pH: 5.5 (ref 5.0–8.0)

## 2022-08-19 LAB — LIPASE, BLOOD: Lipase: 20 U/L (ref 11–51)

## 2022-08-19 MED ORDER — FENTANYL CITRATE PF 50 MCG/ML IJ SOSY
50.0000 ug | PREFILLED_SYRINGE | Freq: Once | INTRAMUSCULAR | Status: AC
  Administered 2022-08-19: 50 ug via INTRAVENOUS
  Filled 2022-08-19: qty 1

## 2022-08-19 MED ORDER — ONDANSETRON 8 MG PO TBDP: 8.0000 mg | ORAL_TABLET | Freq: Three times a day (TID) | ORAL | 0 refills | Status: AC | PRN

## 2022-08-19 MED ORDER — METHYLPREDNISOLONE SODIUM SUCC 125 MG IJ SOLR: 125.0000 mg | Freq: Once | INTRAMUSCULAR | Status: DC

## 2022-08-19 MED ORDER — ACETAMINOPHEN 500 MG PO TABS: 500.0000 mg | ORAL_TABLET | Freq: Four times a day (QID) | ORAL | 0 refills | Status: AC | PRN

## 2022-08-19 MED ORDER — LACTATED RINGERS IV BOLUS
1000.0000 mL | Freq: Once | INTRAVENOUS | Status: AC
  Administered 2022-08-19: 1000 mL via INTRAVENOUS

## 2022-08-19 MED ORDER — POTASSIUM CHLORIDE CRYS ER 20 MEQ PO TBCR
40.0000 meq | EXTENDED_RELEASE_TABLET | Freq: Once | ORAL | Status: AC
  Administered 2022-08-19: 40 meq via ORAL
  Filled 2022-08-19: qty 2

## 2022-08-19 MED ORDER — HYDROCODONE-ACETAMINOPHEN 5-325 MG PO TABS: 1.0000 | ORAL_TABLET | Freq: Two times a day (BID) | ORAL | 0 refills | Status: AC | PRN

## 2022-08-19 MED ORDER — ONDANSETRON HCL 4 MG/2ML IJ SOLN
4.0000 mg | Freq: Once | INTRAMUSCULAR | Status: AC
  Administered 2022-08-19: 4 mg via INTRAVENOUS
  Filled 2022-08-19: qty 2

## 2022-08-19 MED ORDER — DEXAMETHASONE SODIUM PHOSPHATE 10 MG/ML IJ SOLN
10.0000 mg | Freq: Once | INTRAMUSCULAR | Status: AC
  Administered 2022-08-19: 10 mg via INTRAMUSCULAR
  Filled 2022-08-19: qty 1

## 2022-08-19 MED ORDER — PREDNISONE 10 MG (21) PO TBPK: ORAL_TABLET | Freq: Every day | ORAL | 0 refills | Status: DC

## 2022-08-19 MED ORDER — IOHEXOL 300 MG/ML  SOLN
100.0000 mL | Freq: Once | INTRAMUSCULAR | Status: AC | PRN
  Administered 2022-08-19: 100 mL via INTRAVENOUS

## 2022-08-19 NOTE — ED Notes (Signed)
Patient stood out of bed without calling out for assistance. In the process of getting out of bed, the patient removed his PIV in the RAC. Patient encourage to use call bell for assistance in the future for safety. Call bell remains at bedside.

## 2022-08-19 NOTE — ED Triage Notes (Signed)
C/o emesis, diarrhea & abdominal pain x 3 days. Last emesis yesterday. Ate soup today and kept it down. Hx chrons & IBS.

## 2022-08-19 NOTE — Discharge Instructions (Addendum)
You are seen in the emergency room for nausea, vomiting.  Her CT scan reveals evidence of inflammation, which could be signs of Crohn's flareup or early obstruction.  We had requested you with the option of staying in the hospital, however you are comfortable going home.  We recommend that you take the medications as prescribed.  Return to the emergency room if you start having severe pain, intractable nausea and vomiting, bloody stools, bloody emesis, severe weakness, dizziness or lightheadedness, you stop passing gas.  Follow-up with your GI doctor in 7 to 14 days.

## 2022-08-19 NOTE — ED Notes (Signed)
UA and UC sent to lab 

## 2022-08-19 NOTE — ED Provider Notes (Signed)
[facility] Provider Note   CSN: [id number] Arrival date & time: 08/19/22  1653     History  Chief Complaint  Patient presents with   Emesis    Chad Moss is a 67 y.o. male.  HPI     67 year old patient comes in with chief complaint of abdominal pain, diarrhea and vomiting.  Patient has history of Crohn's disease, however the Crohn's has not given him any difficulties over the last several years.  He has history of colon resection because of his Crohn's.  Patient states his current episode of abdominal discomfort started about 3 days ago.  Patient's abdominal discomfort is generalized and he has associated vomiting and diarrhea.  He has not had any emesis today, but he has been nauseous throughout.  Yesterday he had about 3 episodes of emesis.  He also has noted loose bowel movements.  Review of systems negative for any fevers, chills.  Patient is on medications for Crohn's.  They spoke with the GI doctor, who advised ER evaluation.  Home Medications Prior to Admission medications   Medication Sig Start Date End Date Taking? Authorizing Provider  acetaminophen (TYLENOL) 500 MG tablet Take 1 tablet (500 mg total) by mouth every 6 (six) hours as needed. 08/19/22  Yes Varney Biles, MD  HYDROcodone-acetaminophen (NORCO/VICODIN) 5-325 MG tablet Take 1 tablet by mouth every 12 (twelve) hours as needed. 08/19/22  Yes Dmetrius Ambs, MD  ondansetron (ZOFRAN-ODT) 8 MG disintegrating tablet Take 1 tablet (8 mg total) by mouth every 8 (eight) hours as needed for nausea. 08/19/22  Yes Nazariah Cadet, MD  predniSONE (STERAPRED UNI-PAK 21 TAB) 10 MG (21) TBPK tablet Take by mouth daily. Take 6 tabs by mouth daily  for 2 days, then 5 tabs for 2 days, then 4 tabs for 2 days, then 3 tabs for 2 days, 2 tabs for 2 days, then 1 tab by mouth daily for 2 days 08/19/22  Yes Varney Biles, MD  albuterol (PROVENTIL) (2.5 MG/3ML) 0.083% nebulizer solution Take  3 mLs (2.5 mg total) by nebulization every 6 (six) hours as needed for wheezing or shortness of breath. 07/18/18   Brand Males, MD  albuterol (VENTOLIN HFA) 108 (90 Base) MCG/ACT inhaler INHALE 2 PUFFS BY MOUTH INTO THE LUNGS EVERY 6 HOURS AS NEEDED WHEEZING OR SHORTNESS OF BREATH 02/22/22   Martyn Ehrich, NP  alfuzosin (UROXATRAL) 10 MG 24 hr tablet Take 10 mg by mouth at bedtime.    [provider]  amLODipine (NORVASC) 5 MG tablet TAKE 1 TABLET(5 MG) BY MOUTH DAILY 10/21/21   Swinyer, Lanice Schwab, NP  aspirin EC 81 MG tablet Take 81 mg by mouth daily.    [provider]  azithromycin (ZITHROMAX Z-PAK) 250 MG tablet Take 2 tabs today, then 1 tab until gone Patient not taking: Reported on 08/04/2022 07/07/22   Brand Males, MD  bimatoprost (LUMIGAN) 0.01 % SOLN Place 1 drop into the left eye at bedtime.    [provider]  brimonidine (ALPHAGAN) 0.15 % ophthalmic solution Place 1 drop into the left eye 2 (two) times daily.    [provider]  cholestyramine Lucrezia Starch) 4 G packet Take 1 packet by mouth daily.    [provider]  clonazePAM (KLONOPIN) 1 MG tablet Take 1 mg by mouth at bedtime. Anxiety    [provider]  clopidogrel (PLAVIX) 75 MG tablet TAKE 1 TABLET(75 MG) BY MOUTH DAILY 08/08/22   Jettie Booze,  MD  dicyclomine (BENTYL) 10 MG capsule Take 10 mg by mouth 4 (four) times daily -  before meals and at bedtime.    [provider]  diphenhydrAMINE (BENADRYL) 25 MG tablet Take 50 mg by mouth at bedtime.     [provider]  doxycycline (VIBRA-TABS) 100 MG tablet Take 1 tablet (100 mg total) by mouth 2 (two) times daily. 12/01/20   Brand Males, MD  finasteride (PROSCAR) 5 MG tablet Take 5 mg by mouth daily.    [provider]  fluticasone (FLONASE) 50 MCG/ACT nasal spray SHAKE LIQUID AND USE 1 SPRAY IN EACH NOSTRIL DAILY 10/27/19   Martyn Ehrich, NP  furosemide (LASIX) 40 MG tablet TAKE  1 TABLET BY MOUTH 4 TIMES A WEEK 08/08/22   Jettie Booze, MD  isosorbide mononitrate (IMDUR) 30 MG 24 hr tablet Take 1 tablet (30 mg total) by mouth daily. 10/21/21 10/22/22  Swinyer, Lanice Schwab, NP  lidocaine (LIDODERM) 5 % Place 1 patch onto the skin daily. Remove & Discard patch within 12 hours or as directed by MD Patient not taking: Reported on 08/04/2022 05/04/21   Palumbo, April, MD  loratadine (CLARITIN) 10 MG tablet TAKE 1 TABLET(10 MG) BY MOUTH DAILY 11/30/20   [name], FNP  loxapine (LOXITANE) 10 MG capsule Take 10 mg by mouth 2 (two) times daily. 1 tab in AM and 2 tabs in PM    [provider]  mercaptopurine (PURINETHOL) 50 MG tablet Take 75 mg by mouth daily. 1 tablet and a half tablet Give on an empty stomach 1 hour before or 2 hours after meals. Caution: Chemotherapy.    [provider]  mesalamine (PENTASA) 250 MG CR capsule Take 1,000 mg by mouth 4 (four) times daily.     [provider]  methocarbamol (ROBAXIN) 500 MG tablet Take 1 tablet (500 mg total) by mouth 2 (two) times daily. 05/04/21   Palumbo, April, MD  methylPREDNISolone (MEDROL DOSEPAK) 4 MG TBPK tablet Take per package instructions 03/12/22   Roemhildt, Lorin T, PA-C  mirtazapine (REMERON) 30 MG tablet Take 45 mg by mouth at bedtime. 1.5 tablets by mouth daily 01/08/17   [provider]  nitroGLYCERIN (NITROSTAT) 0.4 MG SL tablet Place 1 tablet (0.4 mg total) under the tongue every 5 (five) minutes x 3 doses as needed for chest pain. 04/25/22   Jettie Booze, MD  ondansetron (ZOFRAN) 8 MG tablet Take 8 mg by mouth every 8 (eight) hours as needed for nausea or vomiting. 04/11/22   [provider]  pantoprazole (PROTONIX) 40 MG tablet TAKE 1 TABLET(40 MG) BY MOUTH TWICE DAILY 02/07/22   Jettie Booze, MD  phenol (CHLORASEPTIC GARGLE) 1.4 % LIQD Use as directed 1 spray in the mouth or throat as needed for throat irritation / pain. 09/06/19   Couture, Cortni S,  PA-C  potassium chloride (KLOR-CON) 10 MEQ tablet Take 1 tablet (10 mEq total) by mouth 2 (two) times daily. 04/25/22   Jettie Booze, MD  propranolol (INDERAL) 10 MG tablet 10 mg daily at 6 (six) AM.    [provider]  rosuvastatin (CRESTOR) 20 MG tablet Take 1 tablet (20 mg total) by mouth daily. 11/21/21   Swinyer, Lanice Schwab, NP  solifenacin (VESICARE) 5 MG tablet Take 5 mg by mouth daily. 01/29/22   [provider]  SPIRIVA HANDIHALER 18 MCG inhalation capsule INHALE THE CONTENTS OF 1 CAPSULE VIA INHALATION DEVICE DAILY 05/02/22   Martyn Ehrich,  NP  tamsulosin (FLOMAX) 0.4 MG CAPS capsule Take 0.4 mg by mouth at bedtime. 12/08/19   [provider]  timolol (TIMOPTIC) 0.5 % ophthalmic solution Place 1 drop into the left eye 2 (two) times daily. 07/21/16   [provider]  traZODone (DESYREL) 50 MG tablet Take 150 mg by mouth at bedtime.    [provider]      Allergies    Benztropine, Codeine, Meperidine, Cyclobenzaprine, Penicillins, Sulfamethoxazole-trimethoprim, and Sulfonamide derivatives    Review of Systems   Review of Systems  All other systems reviewed and are negative.   Physical Exam Updated Vital Signs BP (!) 154/78   Pulse 74   Temp 99.3 F (37.4 C) (Oral)   Resp 11   Ht '5\' 11"'$  (1.803 m)   Wt 97.1 kg   SpO2 95%   BMI 29.85 kg/m  Physical Exam Vitals and nursing note reviewed.  Constitutional:      Appearance: He is well-developed.  HENT:     Head: Atraumatic.  Cardiovascular:     Rate and Rhythm: Normal rate.  Pulmonary:     Effort: Pulmonary effort is normal.  Abdominal:     General: There is distension.     Tenderness: There is abdominal tenderness. There is no guarding or rebound.  Musculoskeletal:     Cervical back: Neck supple.  Skin:    General: Skin is warm.  Neurological:     Mental Status: He is alert and oriented to person, place, and time.     ED Results / Procedures / Treatments    Labs (all labs ordered are listed, but only abnormal results are displayed) Labs Reviewed  COMPREHENSIVE METABOLIC PANEL - Abnormal; Notable for the following components:      Result Value   Potassium 2.8 (*)    Glucose, Bld 107 (*)    BUN 7 (*)    Calcium 8.4 (*)    All other components within normal limits  CBC - Abnormal; Notable for the following components:   RBC 3.82 (*)    Hemoglobin 11.9 (*)    HCT 33.4 (*)    All other components within normal limits  LIPASE, BLOOD  URINALYSIS, ROUTINE W REFLEX MICROSCOPIC    EKG None  Radiology CT ABDOMEN PELVIS W CONTRAST  Result Date: 08/19/2022 CLINICAL DATA:  Crohn's exacerbation. Emesis, diarrhea, and abdominal pain. EXAM: CT ABDOMEN AND PELVIS WITH CONTRAST TECHNIQUE: Multidetector CT imaging of the abdomen and pelvis was performed using the standard protocol following bolus administration of intravenous contrast. RADIATION DOSE REDUCTION: This exam was performed according to the departmental dose-optimization program which includes automated exposure control, adjustment of the mA and/or kV according to patient size and/or use of iterative reconstruction technique. CONTRAST:  124m OMNIPAQUE IOHEXOL 300 MG/ML  SOLN COMPARISON:  11/19/2018. FINDINGS: Lower chest: The heart is normal in size and there is a trace pericardial effusion. Multi-vessel coronary artery calcifications are noted. Mild atelectasis is present at the lung bases. Hepatobiliary: Scattered cysts are present in the liver. The gallbladder is surgically absent. No biliary ductal dilatation. Pancreas: Unremarkable. No pancreatic ductal dilatation or surrounding inflammatory changes. Spleen: Normal in size without focal abnormality. Adrenals/Urinary Tract: The adrenal glands are within normal limits. The kidneys enhance symmetrically. Cyst is noted in the right kidney. Additional subcentimeter hypodensities are seen which are too small to further characterize. No renal calculus  or hydronephrosis. The bladder is unremarkable. Stomach/Bowel: There is a small hiatal hernia. The stomach is otherwise within normal  limits. Dilated loops of small bowel are noted in the mid left abdomen measuring up to 3.8 cm with mesenteric hyperemia. No significant bowel wall thickening is seen. A change in caliber is noted in the left lower quadrant in the distal ileum is nondistended. No definite focal transition point is seen. Postsurgical changes are noted in the right colon. Vascular/Lymphatic: Aortic atherosclerosis. No enlarged abdominal or pelvic lymph nodes. Reproductive: Prostate is unremarkable. A penile prosthesis is noted Other: No abdominopelvic ascites. Musculoskeletal: Degenerative changes in the thoracolumbar spine. No acute osseous abnormality. IMPRESSION: 1. Multiple dilated loops of small bowel in the mid left abdomen measuring up to 3.8 cm with mesenteric hyperemia. No definite transition point is identified, however partial or early small-bowel obstruction can not be excluded. Findings may be related to enteritis and history of Crohn's disease. 2. Small hiatal hernia. 3. Aortic atherosclerosis and coronary artery calcifications. Electronically Signed   By: Brett Fairy M.D.   On: 08/19/2022 20:35    Procedures Procedures    Medications Ordered in ED Medications  dexamethasone (DECADRON) injection 10 mg (has no administration in time range)  potassium chloride SA (KLOR-CON M) CR tablet 40 mEq (has no administration in time range)  lactated ringers bolus 1,000 mL (0 mLs Intravenous Stopped 08/19/22 September 01, 2039)  ondansetron (ZOFRAN) injection 4 mg (4 mg Intravenous Given 08/19/22 1928)  fentaNYL (SUBLIMAZE) injection 50 mcg (50 mcg Intravenous Given 08/19/22 1937)  iohexol (OMNIPAQUE) 300 MG/ML solution 100 mL (100 mLs Intravenous Contrast Given 08/19/22 08/31/2004)    ED Course/ Medical Decision Making/ A&P Clinical Course as of 08/19/22 Sep 01, 2219  Sat Aug 19, 2022  2219 CT ABDOMEN PELVIS W  CONTRAST CT scan independently reviewed and interpreted.  Patient has clear evidence of bowel dilation.  No evidence of obstruction.  No evidence of perforation. [AN]  Sep 01, 2218 Patient has passed oral challenge.  I discussed findings with him.  Patient wants to leave home.  I informed him that since he is passing stool, has passed oral challenge here, his pain has resolved and he is not vomiting it is okay for him to go home as long as he understands that if his symptoms get worse he will have to return.  Patient prefers that option.  Caregiver at the bedside, requesting prednisone prescription.  She do is comfortable with patient going home.  At this time I made patient aware that they could have a early obstruction or Crohn's flareup.  It is not entirely clear what is causing the irritation/dilatation of the intestine.  Patient will have to take clear liquid diet only.  If his symptoms are worsening, then he will return to the ER.  We will also give him prednisone.  They will call the GI doctor for a prompt follow-up.  Patient requesting something for pain, will give him 4 Vicodin's for severe pain only.  Patient made aware that pain is a marker for him to return therefore he does not entirely good idea to give him strong pain medicine. [AN]    Clinical Course User Index [AN] Varney Biles, MD                             Medical Decision Making Amount and/or Complexity of Data Reviewed Labs: ordered. Radiology: ordered.  Risk OTC drugs. Prescription drug management.   67 year old patient comes in with chief complaint of abdominal pain along with vomiting and diarrhea.  He has history of Crohn's disease.  On exam patient has generalized tenderness.  No peritonitis noted.  He has history of colonic resection.  Differential diagnosis for him includes Crohn's colitis, Crohn's related complications such as fistula, intra-abdominal abscess and obstruction.  Other possibilities include profound  dehydration, severe electrolyte abnormality.  Patient appears nontoxic at this time.  We will give him some pain medication and initiate basic labs and CT scan.  History provided by patient's caregiver as well, was at the bedside.  I also reviewed patient's chart on care everywhere in.  No recent CT scan noted.  Final Clinical Impression(s) / ED Diagnoses Final diagnoses:  Crohn's disease of colon without complication ([place])    Rx / DC Orders ED Discharge Orders          Ordered    ondansetron (ZOFRAN-ODT) 8 MG disintegrating tablet  Every 8 hours PRN        08/19/22 2217    predniSONE (STERAPRED UNI-PAK 21 TAB) 10 MG (21) TBPK tablet  Daily        08/19/22 2217    acetaminophen (TYLENOL) 500 MG tablet  Every 6 hours PRN        08/19/22 2217    HYDROcodone-acetaminophen (NORCO/VICODIN) 5-325 MG tablet  Every 12 hours PRN        08/19/22 2218              Varney Biles, MD 08/19/22 2221

## 2022-08-22 NOTE — Telephone Encounter (Signed)
REQUESTING OFFICE FAXED OVER ANOTHER REQUEST  STATING SECOND REQUEST. HOWEVER WE DID FAX OVER CLEARANCE AND RECOMMENDATIONS ON 08/04/22. SEE NOTES FROM DR. VARANASI.  I WILL RE-FAX THESE NOTES AGAIN TO THE REQUESTING OFFICE.

## 2022-08-22 NOTE — Telephone Encounter (Signed)
REQUESTING OFFICE SENT ANOTHER CLEARANCE STATING SECOND REQUEST. HOWEVER, OUR OFFICE DID FAX CLEARANCE NOTES AND RECOMMENDATIONS ON 08/04/22. PLEASE SEE NOTES FROM DR. VARANASI.   I WILL RE-FAX THESE NOTES AGAIN.

## 2022-08-24 ENCOUNTER — Telehealth: Payer: Self-pay | Admitting: Interventional Cardiology

## 2022-08-24 NOTE — Telephone Encounter (Signed)
Left voicemail for patient to return call to office. 

## 2022-08-24 NOTE — Telephone Encounter (Addendum)
Patient's caregiver Mliss Sax) has been notified of 08/04/22 lab results.

## 2022-08-24 NOTE — Telephone Encounter (Signed)
Pt is returning call in regards to results. Requesting return call.

## 2022-09-05 ENCOUNTER — Other Ambulatory Visit: Payer: Self-pay | Admitting: Interventional Cardiology

## 2022-09-18 ENCOUNTER — Other Ambulatory Visit: Payer: Self-pay | Admitting: Nurse Practitioner

## 2022-11-05 ENCOUNTER — Other Ambulatory Visit: Payer: Self-pay | Admitting: Nurse Practitioner

## 2022-11-14 ENCOUNTER — Other Ambulatory Visit: Payer: Self-pay | Admitting: Nurse Practitioner

## 2022-12-13 ENCOUNTER — Other Ambulatory Visit: Payer: Self-pay | Admitting: Nurse Practitioner

## 2022-12-31 ENCOUNTER — Other Ambulatory Visit: Payer: Self-pay | Admitting: Primary Care
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
## 2023-03-05 ENCOUNTER — Ambulatory Visit: Payer: 59 | Attending: Interventional Cardiology | Admitting: Interventional Cardiology

## 2023-03-05 ENCOUNTER — Encounter: Payer: Self-pay | Admitting: Interventional Cardiology

## 2023-03-05 VITALS — BP 110/70 | HR 79 | Ht 71.0 in | Wt 224.2 lb

## 2023-03-05 DIAGNOSIS — Z720 Tobacco use: Secondary | ICD-10-CM

## 2023-03-05 DIAGNOSIS — I25119 Atherosclerotic heart disease of native coronary artery with unspecified angina pectoris: Secondary | ICD-10-CM

## 2023-03-05 DIAGNOSIS — I10 Essential (primary) hypertension: Secondary | ICD-10-CM

## 2023-03-05 DIAGNOSIS — I5032 Chronic diastolic (congestive) heart failure: Secondary | ICD-10-CM

## 2023-03-05 DIAGNOSIS — E782 Mixed hyperlipidemia: Secondary | ICD-10-CM

## 2023-03-05 MED ORDER — CLOPIDOGREL BISULFATE 75 MG PO TABS: 75.0000 mg | ORAL_TABLET | Freq: Every day | ORAL | 3 refills | Status: AC

## 2023-03-05 MED ORDER — ROSUVASTATIN CALCIUM 20 MG PO TABS: 20.0000 mg | ORAL_TABLET | Freq: Every day | ORAL | 3 refills | Status: DC

## 2023-03-05 MED ORDER — AMLODIPINE BESYLATE 5 MG PO TABS: ORAL_TABLET | ORAL | 3 refills | Status: AC

## 2023-03-05 MED ORDER — ISOSORBIDE MONONITRATE ER 30 MG PO TB24: 30.0000 mg | ORAL_TABLET | Freq: Every day | ORAL | 3 refills | Status: DC

## 2023-03-05 NOTE — Patient Instructions (Signed)
Medication Instructions:  Your physician recommends that you continue on your current medications as directed. Please refer to the Current Medication list given to you today.  *If you need a refill on your cardiac medications before your next appointment, please call your pharmacy*   Lab Work: none If you have labs (blood work) drawn today and your tests are completely normal, you will receive your results only by: MyChart Message (if you have MyChart) OR A paper copy in the mail If you have any lab test that is abnormal or we need to change your treatment, we will call you to review the results.   Testing/Procedures: None   Follow-Up: At [facility], you and your health needs are our priority.  As part of our continuing mission to provide you with exceptional heart care, we have created designated Provider Care Teams.  These Care Teams include your primary Cardiologist (physician) and Advanced Practice Providers (APPs -  Physician Assistants and Nurse Practitioners) who all work together to provide you with the care you need, when you need it.  We recommend signing up for the patient portal called "MyChart".  Sign up information is provided on this After Visit Summary.  MyChart is used to connect with patients for Virtual Visits (Telemedicine).  Patients are able to view lab/test results, encounter notes, upcoming appointments, etc.  Non-urgent messages can be sent to your provider as well.   To learn more about what you can do with MyChart, go to [email or web address].    Your next appointment:   6 month(s)  Provider:   Dr Lynnette Caffey, Dr Clifton James, Dr Herbie Baltimore   Other Instructions

## 2023-07-06 ENCOUNTER — Encounter: Payer: Self-pay | Admitting: Acute Care

## 2023-07-28 ENCOUNTER — Telehealth: Payer: Self-pay | Admitting: Pulmonary Disease

## 2023-07-28 MED ORDER — AZITHROMYCIN 250 MG PO TABS: ORAL_TABLET | ORAL | 0 refills | Status: DC

## 2023-07-28 MED ORDER — PREDNISONE 20 MG PO TABS: 40.0000 mg | ORAL_TABLET | Freq: Every day | ORAL | 0 refills | Status: DC

## 2023-07-28 NOTE — Telephone Encounter (Signed)
 68 year old male with a history of COPD but currently has a cold and coughing up yellow sputum.  His caretaker has been ill and has resolving symptoms but Chad Moss started having increased cough and sputum production with change in sputum color and quality.  Has not had any fevers, chest pain, or increased wheezing.  Stable bronchodilator use.  Will prescribe azithromycin  Z-Pak and prednisone  40 mg x 5 days

## 2023-07-29 ENCOUNTER — Telehealth: Payer: Self-pay | Admitting: Pulmonary Disease

## 2023-07-29 MED ORDER — PREDNISONE 20 MG PO TABS: 40.0000 mg | ORAL_TABLET | Freq: Every day | ORAL | 0 refills | Status: AC

## 2023-07-29 MED ORDER — AZITHROMYCIN 250 MG PO TABS: ORAL_TABLET | ORAL | 0 refills | Status: AC

## 2023-07-29 NOTE — Addendum Note (Signed)
 Addended byOrbie Binder on: 07/29/2023 12:56 PM   Modules accepted: Orders

## 2023-07-29 NOTE — Telephone Encounter (Signed)
 Received call from after-hours line.  Looks like there is a ship broker, electronic error.  Z-Pak and prednisone  did not go through to pharmacy yesterday.  Z-Pak and prednisone  reordered.  Notably, requested to send to a different pharmacy, sent to [facility] in [facility] given request.

## 2023-09-03 ENCOUNTER — Ambulatory Visit: Payer: 59 | Attending: Cardiovascular Disease | Admitting: Cardiovascular Disease

## 2023-09-03 NOTE — Progress Notes (Deleted)
 No chief complaint on file.  History of Present Illness: 68 yo male with history of CAD, prior CVA, chronic diastolic CHF, tobacco abuse, HLD and Crohn's disease who is here today for follow up. He has been followed in our office by Dr. Eldridge Dace. Cardiac cath in September 2018 with severe Circumflex stenosis treated with a drug eluting stent. (Unable to use right radial artery). He has chronic diastolic CHF and has been on Lasix. Echo May 2023 with LVEF=60-65%. No valve disease.   He is here today for follow up. The patient denies any chest pain, dyspnea, palpitations, lower extremity edema, orthopnea, PND, dizziness, near syncope or syncope.   Primary Care Physician: Raul Del., MD   Past Medical History:  Diagnosis Date   Allergic rhinitis    Anxiety    Atelectasis    CAD (coronary artery disease), native coronary artery    9/18 PCI/DES x1 to mLCx, mild diffuse nonobstructive disease, EF 55% on Lv gram   Chronic bronchitis (HCC)    COPD (chronic obstructive pulmonary disease) (HCC)    Crohn's disease (HCC)    Depression    DVT (deep venous thrombosis) (HCC)    RLE X 2   Dysphagia    Dyspnea    Enlarged prostate    GERD (gastroesophageal reflux disease)    Glaucoma, both eyes    Heart murmur    History of stomach ulcers 1980s   Hyperlipidemia    Hypertension    "off RX for years now cause of coughing w/Lisinopril" (03/13/2017)   IBS (irritable bowel syndrome)    Paranoid schizophrenia (HCC)    Peripheral vascular disease (HCC)    Pneumonia 1990s   Pre-diabetes    "one time" (03/13/2017)   Stroke (HCC) 04/2016   "eye stroke; left eye" (03/13/2017)   Syncopal episodes     Past Surgical History:  Procedure Laterality Date   ABDOMINAL HERNIA REPAIR  1990s   COLECTOMY  1985; ?1989; 1996   "part of my ileum removed"   CORONARY ANGIOPLASTY WITH STENT PLACEMENT  03/13/2017   CORONARY STENT INTERVENTION N/A 03/13/2017   Procedure: CORONARY STENT INTERVENTION;   Surgeon: Corky Crafts, MD;  Location: [facility] INVASIVE CV LAB;  Service: Cardiovascular;  Laterality: N/A;   EYE SURGERY Right    "laser; for glaucoma" (03/13/2017)   FASCIOTOMY Right    HERNIA REPAIR     LAPAROSCOPIC CHOLECYSTECTOMY     LEFT HEART CATH AND CORONARY ANGIOGRAPHY N/A 03/13/2017   Procedure: LEFT HEART CATH AND CORONARY ANGIOGRAPHY;  Surgeon: Corky Crafts, MD;  Location: [facility] INVASIVE CV LAB;  Service: Cardiovascular;  Laterality: N/A;   PENILE PROSTHESIS IMPLANT  01/2011   Hattie Perch 02/01/2011   PERIPHERAL VASCULAR CATHETERIZATION Right 1999   "had blood clots in my leg; went in to open them up; dye went into leg; ended up having a fasiotomy"   TONSILLECTOMY      Current Outpatient Medications  Medication Sig Dispense Refill   acetaminophen (TYLENOL) 500 MG tablet Take 1 tablet (500 mg total) by mouth every 6 (six) hours as needed. 30 tablet 0   albuterol (PROVENTIL) (2.5 MG/3ML) 0.083% nebulizer solution Take 3 mLs (2.5 mg total) by nebulization every 6 (six) hours as needed for wheezing or shortness of breath. 300 mL 5   albuterol (VENTOLIN HFA) 108 (90 Base) MCG/ACT inhaler INHALE 2 PUFFS BY MOUTH EVERY 6 HOURS AS NEEDED FOR WHEEZING OR SHORTNESS OF BREATH 25.5 g 3   alfuzosin (UROXATRAL)  10 MG 24 hr tablet Take 10 mg by mouth at bedtime.     amLODipine (NORVASC) 5 MG tablet TAKE 1 TABLET(5 MG) BY MOUTH DAILY 90 tablet 3   aspirin EC 81 MG tablet Take 81 mg by mouth daily.     bimatoprost (LUMIGAN) 0.01 % SOLN Place 1 drop into both eyes at bedtime.     brimonidine (ALPHAGAN) 0.15 % ophthalmic solution Place 1 drop into both eyes 2 (two) times daily.     cholestyramine (QUESTRAN) 4 G packet Take 1 packet by mouth daily.     clonazePAM (KLONOPIN) 1 MG tablet Take 1 mg by mouth at bedtime. Anxiety     clopidogrel (PLAVIX) 75 MG tablet Take 1 tablet (75 mg total) by mouth daily. 90 tablet 3   dicyclomine (BENTYL) 10 MG capsule Take 10 mg by mouth 4 (four) times daily -   before meals and at bedtime.     diphenhydrAMINE (BENADRYL) 25 MG tablet Take 50 mg by mouth at bedtime.      doxycycline (VIBRA-TABS) 100 MG tablet Take 1 tablet (100 mg total) by mouth 2 (two) times daily. 10 tablet 0   finasteride (PROSCAR) 5 MG tablet Take 5 mg by mouth daily.     furosemide (LASIX) 40 MG tablet TAKE 1 TABLET BY MOUTH 4 TIMES A WEEK 55 tablet 3   HYDROcodone-acetaminophen (NORCO/VICODIN) 5-325 MG tablet Take 1 tablet by mouth every 12 (twelve) hours as needed. 4 tablet 0   isosorbide mononitrate (IMDUR) 30 MG 24 hr tablet Take 1 tablet (30 mg total) by mouth daily. 90 tablet 3   loxapine (LOXITANE) 10 MG capsule Take 10 mg by mouth 2 (two) times daily. 1 tab in AM and 2 tabs in PM     mercaptopurine (PURINETHOL) 50 MG tablet Take 75 mg by mouth daily. 1 tablet and a half tablet Give on an empty stomach 1 hour before or 2 hours after meals. Caution: Chemotherapy.     mesalamine (PENTASA) 250 MG CR capsule Take 1,000 mg by mouth 4 (four) times daily.      metFORMIN (GLUCOPHAGE-XR) 500 MG 24 hr tablet Take 500 mg by mouth daily.     methocarbamol (ROBAXIN) 500 MG tablet Take 1 tablet (500 mg total) by mouth 2 (two) times daily. 20 tablet 0   mirtazapine (REMERON) 30 MG tablet Take 45 mg by mouth at bedtime. 1.5 tablets by mouth daily     nitroGLYCERIN (NITROSTAT) 0.4 MG SL tablet Place 1 tablet (0.4 mg total) under the tongue every 5 (five) minutes x 3 doses as needed for chest pain. 25 tablet 6   ondansetron (ZOFRAN-ODT) 8 MG disintegrating tablet Take 1 tablet (8 mg total) by mouth every 8 (eight) hours as needed for nausea. 20 tablet 0   pantoprazole (PROTONIX) 40 MG tablet TAKE 1 TABLET(40 MG) BY MOUTH TWICE DAILY 180 tablet 3   potassium chloride (KLOR-CON) 10 MEQ tablet Take 1 tablet (10 mEq total) by mouth 2 (two) times daily. 180 tablet 3   propranolol (INDERAL) 10 MG tablet 10 mg daily at 6 (six) AM.     rosuvastatin (CRESTOR) 20 MG tablet Take 1 tablet (20 mg total) by  mouth daily. 90 tablet 3   solifenacin (VESICARE) 5 MG tablet Take 5 mg by mouth daily.     SPIRIVA HANDIHALER 18 MCG inhalation capsule INHALE THE CONTENTS OF 1 CAPSULE VIA INHALATION DEVICE DAILY 30 capsule 5   tamsulosin (FLOMAX) 0.4 MG CAPS capsule Take  0.4 mg by mouth at bedtime.     timolol (TIMOPTIC) 0.5 % ophthalmic solution Place 1 drop into both eyes 2 (two) times daily.     traMADol (ULTRAM) 50 MG tablet Take 50 mg by mouth as needed.     traZODone (DESYREL) 50 MG tablet Take 150 mg by mouth at bedtime.     No current facility-administered medications for this visit.    Allergies  Allergen Reactions   Benztropine Anaphylaxis   Codeine Anaphylaxis   Meperidine Swelling   Cyclobenzaprine Other (See Comments)    Pt. Does not remember   Penicillins Rash    Has patient had a PCN reaction causing immediate rash, facial/tongue/throat swelling, SOB or lightheadedness with hypotension: Yes Has patient had a PCN reaction causing severe rash involving mucus membranes or skin necrosis: No Has patient had a PCN reaction that required hospitalization No Has patient had a PCN reaction occurring within the last 10 years: No If all of the above answers are "NO", then may proceed with Cephalosporin use.    Sulfamethoxazole-Trimethoprim     REACTION: hives   Sulfonamide Derivatives     REACTION: hives    Social History   Socioeconomic History   Marital status: Single    Spouse name: Not on file   Number of children: Not on file   Years of education: 12 grade level    Highest education level: 12th grade  Occupational History   Occupation: disabled  Tobacco Use   Smoking status: Former    Current packs/day: 2.00    Average packs/day: 2.0 packs/day for 54.8 years (109.6 ttl pk-yrs)    Types: Cigarettes    Start date: 11/18/1968   Smokeless tobacco: Never   Tobacco comments:    currently smoking 2ppd  Vaping Use   Vaping status: Never Used  Substance and Sexual Activity    Alcohol use: No    Alcohol/week: 0.0 standard drinks of alcohol   Drug use: No   Sexual activity: Not on file  Other Topics Concern   Not on file  Social History Narrative   ** Merged History Encounter **       Social Drivers of Health   Financial Resource Strain: Low Risk  (09/06/2022)   Received from [facility], [facility]   Overall Financial Resource Strain (CARDIA)    Difficulty of Paying Living Expenses: Not hard at all  Food Insecurity: No Food Insecurity (09/06/2022)   Received from [facility], [facility]   Hunger Vital Sign    Worried About Running Out of Food in the Last Year: Never true    Ran Out of Food in the Last Year: Never true  Transportation Needs: No Transportation Needs (09/06/2022)   Received from [facility], [facility]   [facility] - Transportation    Lack of Transportation (Medical): No    Lack of Transportation (Non-Medical): No  Physical Activity: Inactive (06/09/2020)   Exercise Vital Sign    Days of Exercise per Week: 0 days    Minutes of Exercise per Session: 10 min  Stress: No Stress Concern Present (06/09/2020)   [employer] of Occupational Health - Occupational Stress Questionnaire    Feeling of Stress : Only a little  Social Connections: Unknown (06/09/2020)   Social Connection and Isolation Panel [NHANES]    Frequency of Communication with Friends and Family: Once a week    Frequency of Social Gatherings with Friends and Family: Never  Attends Religious Services: Never    Active Member of Clubs or Organizations: No    Attends Banker Meetings: Never    Marital Status: Not on file  Intimate Partner Violence: Not At Risk (03/28/2018)   Humiliation, Afraid, Rape, and Kick questionnaire    Fear of Current or Ex-Partner: No    Emotionally Abused: No    Physically Abused: No    Sexually Abused: No    Family History  Problem Relation Age of Onset   Hypertension Mother    Heart disease Mother     Hypertension Father    Heart disease Father    Lung cancer Brother        2006    Review of Systems:  As stated in the HPI and otherwise negative.   There were no vitals taken for this visit.  Physical Examination: General: Well developed, well nourished, NAD  HEENT: OP clear, mucus membranes moist  SKIN: warm, dry. No rashes. Neuro: No focal deficits  Musculoskeletal: Muscle strength 5/5 all ext  Psychiatric: Mood and affect normal  Neck: No JVD, no carotid bruits, no thyromegaly, no lymphadenopathy.  Lungs:Clear bilaterally, no wheezes, rhonci, crackles Cardiovascular: Regular rate and rhythm. No murmurs, gallops or rubs. Abdomen:Soft. Bowel sounds present. Non-tender.  Extremities: No lower extremity edema. Pulses are 2 + in the bilateral DP/PT.  EKG:  EKG {ACTION; IS/IS ONG:[id number]} ordered today. The ekg ordered today demonstrates ***  Recent Labs: No results found for requested labs within last 365 days.   Lipid Panel    Component Value Date/Time   CHOL 97 (L) 04/27/2022 1128   TRIG 138 04/27/2022 1128   HDL 33 (L) 04/27/2022 1128   CHOLHDL 2.9 04/27/2022 1128   LDLCALC 40 04/27/2022 1128     Wt Readings from Last 3 Encounters:  03/05/23 101.7 kg  08/19/22 97.1 kg  08/04/22 94.9 kg    Assessment and Plan:   1. CAD without angina: No chest pain. Continue ASA, Plavix, statin and beta blocker.   2. Hyperlipidemia: Lipids followed in primary care. Continue statin  3. HTN: BP is well controlled. Continue current therapy  Labs/ tests ordered today include:  No orders of the defined types were placed in this encounter.  Disposition:   F/U with me in 12 months    Signed, Verne Carrow, MD, [facility] 09/03/2023 9:29 AM    [facility] [address] [place], [place], [place]  [address] Phone: [phone]; Fax: [phone]

## 2023-09-04 ENCOUNTER — Encounter: Payer: Self-pay | Admitting: Cardiovascular Disease

## 2023-10-15 ENCOUNTER — Encounter: Payer: Self-pay | Admitting: Cardiology

## 2023-10-15 DIAGNOSIS — N400 Enlarged prostate without lower urinary tract symptoms: Secondary | ICD-10-CM | POA: Insufficient documentation

## 2023-10-15 DIAGNOSIS — J9811 Atelectasis: Secondary | ICD-10-CM | POA: Insufficient documentation

## 2023-10-15 DIAGNOSIS — K219 Gastro-esophageal reflux disease without esophagitis: Secondary | ICD-10-CM | POA: Insufficient documentation

## 2023-10-15 DIAGNOSIS — F419 Anxiety disorder, unspecified: Secondary | ICD-10-CM | POA: Insufficient documentation

## 2023-10-15 DIAGNOSIS — R7303 Prediabetes: Secondary | ICD-10-CM | POA: Insufficient documentation

## 2023-10-15 DIAGNOSIS — H409 Unspecified glaucoma: Secondary | ICD-10-CM | POA: Insufficient documentation

## 2023-10-15 DIAGNOSIS — I82409 Acute embolism and thrombosis of unspecified deep veins of unspecified lower extremity: Secondary | ICD-10-CM | POA: Insufficient documentation

## 2023-10-15 DIAGNOSIS — Z8711 Personal history of peptic ulcer disease: Secondary | ICD-10-CM | POA: Insufficient documentation

## 2023-10-15 DIAGNOSIS — J189 Pneumonia, unspecified organism: Secondary | ICD-10-CM | POA: Insufficient documentation

## 2023-10-15 DIAGNOSIS — F200 Paranoid schizophrenia: Secondary | ICD-10-CM | POA: Insufficient documentation

## 2023-10-15 DIAGNOSIS — R011 Cardiac murmur, unspecified: Secondary | ICD-10-CM | POA: Insufficient documentation

## 2023-10-15 DIAGNOSIS — K589 Irritable bowel syndrome without diarrhea: Secondary | ICD-10-CM | POA: Insufficient documentation

## 2023-10-15 DIAGNOSIS — F32A Depression, unspecified: Secondary | ICD-10-CM | POA: Insufficient documentation

## 2023-10-15 DIAGNOSIS — J42 Unspecified chronic bronchitis: Secondary | ICD-10-CM | POA: Insufficient documentation

## 2023-10-15 DIAGNOSIS — R55 Syncope and collapse: Secondary | ICD-10-CM | POA: Insufficient documentation

## 2023-10-17 ENCOUNTER — Ambulatory Visit: Attending: Cardiology | Admitting: Cardiology

## 2023-10-17 ENCOUNTER — Encounter: Payer: Self-pay | Admitting: Cardiology

## 2023-10-17 VITALS — BP 124/72 | HR 87 | Ht 71.0 in | Wt 211.8 lb

## 2023-10-17 DIAGNOSIS — N400 Enlarged prostate without lower urinary tract symptoms: Secondary | ICD-10-CM

## 2023-10-17 DIAGNOSIS — E782 Mixed hyperlipidemia: Secondary | ICD-10-CM | POA: Diagnosis not present

## 2023-10-17 DIAGNOSIS — Z8711 Personal history of peptic ulcer disease: Secondary | ICD-10-CM

## 2023-10-17 DIAGNOSIS — I10 Essential (primary) hypertension: Secondary | ICD-10-CM | POA: Diagnosis not present

## 2023-10-17 DIAGNOSIS — F419 Anxiety disorder, unspecified: Secondary | ICD-10-CM

## 2023-10-17 DIAGNOSIS — R011 Cardiac murmur, unspecified: Secondary | ICD-10-CM

## 2023-10-17 DIAGNOSIS — R7303 Prediabetes: Secondary | ICD-10-CM

## 2023-10-17 DIAGNOSIS — I25119 Atherosclerotic heart disease of native coronary artery with unspecified angina pectoris: Secondary | ICD-10-CM

## 2023-10-17 DIAGNOSIS — J9811 Atelectasis: Secondary | ICD-10-CM

## 2023-10-17 DIAGNOSIS — F200 Paranoid schizophrenia: Secondary | ICD-10-CM

## 2023-10-17 NOTE — Patient Instructions (Signed)
 Medication Instructions:  Your physician recommends that you continue on your current medications as directed. Please refer to the Current Medication list given to you today.  *If you need a refill on your cardiac medications before your next appointment, please call your pharmacy*   Lab Work: None Ordered If you have labs (blood work) drawn today and your tests are completely normal, you will receive your results only by: MyChart Message (if you have MyChart) OR A paper copy in the mail If you have any lab test that is abnormal or we need to change your treatment, we will call you to review the results.   Testing/Procedures: None Ordered   Follow-Up: At [facility], you and your health needs are our priority.  As part of our continuing mission to provide you with exceptional heart care, we have created designated Provider Care Teams.  These Care Teams include your primary Cardiologist (physician) and Advanced Practice Providers (APPs -  Physician Assistants and Nurse Practitioners) who all work together to provide you with the care you need, when you need it.  We recommend signing up for the patient portal called "MyChart".  Sign up information is provided on this After Visit Summary.  MyChart is used to connect with patients for Virtual Visits (Telemedicine).  Patients are able to view lab/test results, encounter notes, upcoming appointments, etc.  Non-urgent messages can be sent to your provider as well.   To learn more about what you can do with MyChart, go to [email or web address].    Your next appointment:   12 month follow up

## 2023-10-17 NOTE — Progress Notes (Signed)
 Cardiology Office Note:    Date:  10/17/2023   ID:  Chad Moss, DOB October 06, 1955, MRN [id number]  PCP:  Kathryne Parisian., MD  Cardiologist:  Nelia Balzarine, MD   Referring MD: Kathryne Parisian.,*    ASSESSMENT:    1. Anxiety   2. Atelectasis   3. Enlarged prostate   4. Heart murmur   5. History of stomach ulcers   6. Paranoid schizophrenia (HCC)   7. Pre-diabetes   8. Essential hypertension   9. Coronary artery disease involving native coronary artery of native heart with angina pectoris (HCC)   10. Mixed hyperlipidemia    PLAN:    In order of problems listed above:  Coronary artery disease: Secondary prevention stressed with the patient.  Importance of compliance with diet medication stressed and patient verbalized standing.  He was advised to walk at least half an hour a day on a daily basis and he promises to do so. Essential hypertension: Blood pressure is stable and diet was emphasized.  Lifestyle modification urged. Mixed dyslipidemia: On lipid-lowering medications followed by primary care.  Lipids are at goal.  Diet emphasized. Renal insufficiency: Stable and followed by primary care.  Caution urged. Patient will be seen in follow-up appointment in 12 months or earlier if the patient has any concerns.    Medication Adjustments/Labs and Tests Ordered: Current medicines are reviewed at length with the patient today.  Concerns regarding medicines are outlined above.  Orders Placed This Encounter  Procedures   EKG 12-Lead   No orders of the defined types were placed in this encounter.    No chief complaint on file.    History of Present Illness:    Chad Moss is a 68 y.o. male.  Patient has past medical history of coronary artery disease post stenting, essential hypertension, dyslipidemia and obesity.  He is an active gentleman.  He walks on a regular basis.  He is here for follow-up.  He is to see my partner who has moved out of the  practice.  At the time of my evaluation, the patient is alert awake oriented and in no distress.  Past Medical History:  Diagnosis Date   Abnormal weight gain 03/09/2018   Acute sinusitis 08/09/2015   Allergic rhinitis    Allergic rhinitis 01/27/2009   Qualifier: Diagnosis of   By: Jens Molder CMA, Jennifer         Anxiety    AP (abdominal pain) 07/09/2014   Atelectasis    CAD (coronary artery disease), native coronary artery    9/18 PCI/DES x1 to mLCx, mild diffuse nonobstructive disease, EF 55% on Lv gram   Chronic bronchitis (HCC)    Chronic cough 11/19/2011   Chronic diastolic heart failure (HCC) 11/08/2020   COPD (chronic obstructive pulmonary disease) (HCC)    COPD with emphysema (HCC) 02/16/2022   Coronary artery calcification seen on CAT scan 01/12/2017   Coronary artery disease involving native coronary artery of native heart with angina pectoris (HCC) 03/13/2017   Cough 08/09/2015   COVID-19 virus detected 11/25/2018   11/19/2018-SARS-CoV-2-positive     11/19/2018-CT chest with contrast- left lower lobe airspace opacity is noted consistent with pneumonia, 5 mm right middle lobe nodule is noted no follow-up needed patient is low risk, noncontrast chest CT can be considered in 12 months if patient is high risk     Crohn's disease (HCC)    Crohn's disease without complication (HCC) 01/26/2009   Qualifier: History of  By: Jens Molder CMA, Jennifer         Depression    Diarrhea 11/19/2018   DIZZINESS, CHRONIC 01/27/2009   Qualifier: Diagnosis of   By: Bertrum Brodie MD, Murali         DVT (deep venous thrombosis) (HCC)    RLE X 2   Dysphagia    Dyspnea    Edema 03/09/2018   Encounter for screening for lung cancer 10/09/2012   Shared decision making visit 01/05/2020  Baseline scan to be scheduled by wife.     Enlarged prostate    Essential hypertension 01/27/2009   Qualifier: Diagnosis of   By: Jens Molder CMA, Jennifer      IMO SNOMED Dx Update Oct 2024     Excessive daytime sleepiness  02/16/2022   Fever in adult 11/19/2018   Flu-like symptoms 08/09/2015   GERD (gastroesophageal reflux disease)    Glaucoma, both eyes    Heart murmur    History of stomach ulcers 1980s   Hyperlipidemia    Hypertension    "off RX for years now cause of coughing w/Lisinopril" (03/13/2017)   IBS (irritable bowel syndrome)    Inadequate community support 11/19/2018   Mixed hyperlipidemia 01/27/2009   Qualifier: Diagnosis of   By: Jens Molder CMA, Jennifer         Paranoid schizophrenia (HCC)    PERIPHERAL VASCULAR DISEASE 01/27/2009   Qualifier: Diagnosis of   By: Jens Molder CMA, Jennifer         Peripheral vascular disease (HCC)    Pneumonia 1990s   Pre-diabetes    "one time" (03/13/2017)   SHORTNESS OF BREATH (SOB) 01/27/2009   Qualifier: Diagnosis of   By: Bertrum Brodie MD, Murali         Stroke [facility]) 04/2016   "eye stroke; left eye" (03/13/2017)   Syncopal episodes    Tobacco abuse 04/06/2011    Past Surgical History:  Procedure Laterality Date   ABDOMINAL HERNIA REPAIR  1990s   COLECTOMY  1985; ?1989; 1996   "part of my ileum removed"   CORONARY ANGIOPLASTY WITH STENT PLACEMENT  03/13/2017   CORONARY STENT INTERVENTION N/A 03/13/2017   Procedure: CORONARY STENT INTERVENTION;  Surgeon: Lucendia Rusk, MD;  Location: [facility] INVASIVE CV LAB;  Service: Cardiovascular;  Laterality: N/A;   EYE SURGERY Right    "laser; for glaucoma" (03/13/2017)   FASCIOTOMY Right    HERNIA REPAIR     LAPAROSCOPIC CHOLECYSTECTOMY     LEFT HEART CATH AND CORONARY ANGIOGRAPHY N/A 03/13/2017   Procedure: LEFT HEART CATH AND CORONARY ANGIOGRAPHY;  Surgeon: Lucendia Rusk, MD;  Location: [facility] INVASIVE CV LAB;  Service: Cardiovascular;  Laterality: N/A;   PENILE PROSTHESIS IMPLANT  01/2011   Maximo Spar 02/01/2011   PERIPHERAL VASCULAR CATHETERIZATION Right 1999   "had blood clots in my leg; went in to open them up; dye went into leg; ended up having a fasiotomy"   TONSILLECTOMY      Current  Medications: Current Meds  Medication Sig   acetaminophen  (TYLENOL ) 500 MG tablet Take 1 tablet (500 mg total) by mouth every 6 (six) hours as needed.   albuterol  (PROVENTIL ) (2.5 MG/3ML) 0.083% nebulizer solution Take 3 mLs (2.5 mg total) by nebulization every 6 (six) hours as needed for wheezing or shortness of breath.   albuterol  (VENTOLIN  HFA) 108 (90 Base) MCG/ACT inhaler INHALE 2 PUFFS BY MOUTH EVERY 6 HOURS AS NEEDED FOR WHEEZING OR SHORTNESS OF BREATH   alfuzosin  (UROXATRAL ) 10 MG 24 hr tablet Take 10 mg  by mouth at bedtime.   amLODipine  (NORVASC ) 5 MG tablet TAKE 1 TABLET(5 MG) BY MOUTH DAILY   aspirin  EC 81 MG tablet Take 81 mg by mouth daily.   bimatoprost (LUMIGAN) 0.01 % SOLN Place 1 drop into both eyes at bedtime.   brimonidine  (ALPHAGAN ) 0.15 % ophthalmic solution Place 1 drop into both eyes 2 (two) times daily.   cholestyramine  (QUESTRAN ) 4 G packet Take 1 packet by mouth daily.   clonazePAM  (KLONOPIN ) 1 MG tablet Take 1 mg by mouth at bedtime. Anxiety   clopidogrel  (PLAVIX ) 75 MG tablet Take 1 tablet (75 mg total) by mouth daily.   dicyclomine  (BENTYL ) 10 MG capsule Take 10 mg by mouth 4 (four) times daily -  before meals and at bedtime.   diphenhydrAMINE  (BENADRYL ) 25 MG tablet Take 50 mg by mouth at bedtime.    doxycycline  (VIBRA -TABS) 100 MG tablet Take 1 tablet (100 mg total) by mouth 2 (two) times daily.   finasteride  (PROSCAR ) 5 MG tablet Take 5 mg by mouth daily.   furosemide  (LASIX ) 40 MG tablet TAKE 1 TABLET BY MOUTH 4 TIMES A WEEK   gabapentin (NEURONTIN) 100 MG capsule Take 100 mg by mouth 3 (three) times daily.   HYDROcodone -acetaminophen  (NORCO/VICODIN) 5-325 MG tablet Take 1 tablet by mouth every 12 (twelve) hours as needed.   isosorbide  mononitrate (IMDUR ) 30 MG 24 hr tablet Take 1 tablet (30 mg total) by mouth daily.   loxapine  (LOXITANE ) 10 MG capsule Take 10 mg by mouth 2 (two) times daily. 1 tab in AM and 2 tabs in PM   meclizine (ANTIVERT) 25 MG tablet Take  25 mg by mouth 3 (three) times daily as needed for dizziness or nausea.   mercaptopurine  (PURINETHOL ) 50 MG tablet Take 75 mg by mouth daily. 1 tablet and a half tablet Give on an empty stomach 1 hour before or 2 hours after meals. Caution: Chemotherapy.   mesalamine  (PENTASA ) 250 MG CR capsule Take 1,000 mg by mouth 4 (four) times daily.    metFORMIN (GLUCOPHAGE-XR) 500 MG 24 hr tablet Take 500 mg by mouth daily.   methocarbamol  (ROBAXIN ) 500 MG tablet Take 1 tablet (500 mg total) by mouth 2 (two) times daily.   mirtazapine  (REMERON ) 30 MG tablet Take 45 mg by mouth at bedtime. 1.5 tablets by mouth daily   nitroGLYCERIN  (NITROSTAT ) 0.4 MG SL tablet Place 1 tablet (0.4 mg total) under the tongue every 5 (five) minutes x 3 doses as needed for chest pain.   ondansetron  (ZOFRAN ) 8 MG tablet Take 8 mg by mouth every 8 (eight) hours as needed for nausea.   ondansetron  (ZOFRAN -ODT) 8 MG disintegrating tablet Take 1 tablet (8 mg total) by mouth every 8 (eight) hours as needed for nausea.   pantoprazole  (PROTONIX ) 40 MG tablet TAKE 1 TABLET(40 MG) BY MOUTH TWICE DAILY   PERMETHRIN EX 1 each by Other route in the morning.   potassium chloride  (KLOR-CON ) 10 MEQ tablet Take 1 tablet (10 mEq total) by mouth 2 (two) times daily.   propranolol (INDERAL) 10 MG tablet 10 mg daily at 6 (six) AM.   rosuvastatin  (CRESTOR ) 20 MG tablet Take 1 tablet (20 mg total) by mouth daily.   solifenacin (VESICARE) 5 MG tablet Take 5 mg by mouth daily.   SPIRIVA  HANDIHALER 18 MCG inhalation capsule INHALE THE CONTENTS OF 1 CAPSULE VIA INHALATION DEVICE DAILY   tamsulosin (FLOMAX) 0.4 MG CAPS capsule Take 0.4 mg by mouth at bedtime.   timolol  (TIMOPTIC )  0.5 % ophthalmic solution Place 1 drop into both eyes 2 (two) times daily.   traMADol  (ULTRAM ) 50 MG tablet Take 50 mg by mouth as needed.   traZODone  (DESYREL ) 50 MG tablet Take 150 mg by mouth at bedtime.     Allergies:   Benztropine, Codeine, Meperidine, Cyclobenzaprine,  Penicillins, Sulfamethoxazole-trimethoprim, and Sulfonamide derivatives   Social History   Socioeconomic History   Marital status: Single    Spouse name: Not on file   Number of children: Not on file   Years of education: 12 grade level    Highest education level: 12th grade  Occupational History   Occupation: disabled  Tobacco Use   Smoking status: Former    Current packs/day: 2.00    Average packs/day: 2.0 packs/day for 54.9 years (109.8 ttl pk-yrs)    Types: Cigarettes    Start date: 11/18/1968   Smokeless tobacco: Never   Tobacco comments:    currently smoking 2ppd  Vaping Use   Vaping status: Never Used  Substance and Sexual Activity   Alcohol use: No    Alcohol/week: 0.0 standard drinks of alcohol   Drug use: No   Sexual activity: Not on file  Other Topics Concern   Not on file  Social History Narrative   ** Merged History Encounter **       Social Drivers of Health   Financial Resource Strain: Low Risk  (09/06/2022)   Received from [facility], [facility]   Overall Financial Resource Strain (CARDIA)    Difficulty of Paying Living Expenses: Not hard at all  Food Insecurity: No Food Insecurity (09/06/2022)   Received from [facility], [facility]   Hunger Vital Sign    Worried About Running Out of Food in the Last Year: Never true    Ran Out of Food in the Last Year: Never true  Transportation Needs: No Transportation Needs (09/06/2022)   Received from [facility], [facility]   [facility] - Transportation    Lack of Transportation (Medical): No    Lack of Transportation (Non-Medical): No  Physical Activity: Inactive (06/09/2020)   Exercise Vital Sign    Days of Exercise per Week: 0 days    Minutes of Exercise per Session: 10 min  Stress: No Stress Concern Present (06/09/2020)   [employer] of Occupational Health - Occupational Stress Questionnaire    Feeling of Stress : Only a little  Social Connections: Unknown (06/09/2020)    Social Connection and Isolation Panel [NHANES]    Frequency of Communication with Friends and Family: Once a week    Frequency of Social Gatherings with Friends and Family: Never    Attends Religious Services: Never    Database administrator or Organizations: No    Attends Engineer, structural: Never    Marital Status: Not on file     Family History: The patient's family history includes Heart disease in his father and mother; Hypertension in his father and mother; Lung cancer in his brother.  ROS:   Please see the history of present illness.    All other systems reviewed and are negative.  EKGs/Labs/Other Studies Reviewed:    The following studies were reviewed today: I discussed my findings with the patient at length Last..EKG Interpretation Date/Time:  Wednesday October 17 2023 15:02:31 EDT Ventricular Rate:  87 PR Interval:  142 QRS Duration:  82 QT Interval:  376 QTC Calculation: 452 R Axis:   -39  Text Interpretation:  Normal sinus rhythm Left axis deviation When compared with ECG of 19-Aug-2022 19:36, PREVIOUS ECG IS PRESENT Confirmed by Hillis Lu [phone]) on 10/17/2023 3:05:45 PM     Recent Labs: No results found for requested labs within last 365 days.  Recent Lipid Panel    Component Value Date/Time   CHOL 97 (L) 04/27/2022 1128   TRIG 138 04/27/2022 1128   HDL 33 (L) 04/27/2022 1128   CHOLHDL 2.9 04/27/2022 1128   LDLCALC 40 04/27/2022 1128    Physical Exam:    VS:  BP 124/72   Pulse 87   Ht 5\' 11"  (1.803 m)   Wt 211 lb 12.8 oz (96.1 kg)   SpO2 98%   BMI 29.54 kg/m     Wt Readings from Last 3 Encounters:  10/17/23 211 lb 12.8 oz (96.1 kg)  03/05/23 224 lb 3.2 oz (101.7 kg)  08/19/22 214 lb (97.1 kg)     GEN: Patient is in no acute distress HEENT: Normal NECK: No JVD; No carotid bruits LYMPHATICS: No lymphadenopathy CARDIAC: Hear sounds regular, 2/6 systolic murmur at the apex. RESPIRATORY:  Clear to auscultation without rales,  wheezing or rhonchi  ABDOMEN: Soft, non-tender, non-distended MUSCULOSKELETAL:  No edema; No deformity  SKIN: Warm and dry NEUROLOGIC:  Alert and oriented x 3 PSYCHIATRIC:  Normal affect   Signed, Nelia Balzarine, MD  10/17/2023 3:08 PM    [facility]

## 2023-10-24 ENCOUNTER — Telehealth: Payer: Self-pay | Admitting: Cardiology

## 2023-10-24 MED ORDER — NITROGLYCERIN 0.4 MG SL SUBL: 0.4000 mg | SUBLINGUAL_TABLET | SUBLINGUAL | 6 refills | Status: AC | PRN

## 2023-10-24 NOTE — Telephone Encounter (Signed)
*  STAT* If patient is at the pharmacy, call can be transferred to refill team.   1. Which medications need to be refilled? (please list name of each medication and dose if known) nitroGLYCERIN  (NITROSTAT ) 0.4 MG SL tablet   2. Which pharmacy/location (including street and city if local pharmacy) is medication to be sent to?  [facility] - [place], [place] - [address]      3. Do they need a 30 day or 90 day supply? 90 day  Pt is out of medication

## 2023-11-09 ENCOUNTER — Emergency Department (HOSPITAL_BASED_OUTPATIENT_CLINIC_OR_DEPARTMENT_OTHER)
Admission: EM | Admit: 2023-11-09 | Discharge: 2023-11-09 | Disposition: A | Discharge status: Home / Self Care | Attending: Emergency Medicine | Admitting: Emergency Medicine

## 2023-11-09 ENCOUNTER — Ambulatory Visit: Payer: Self-pay | Admitting: Pulmonary Disease

## 2023-11-09 ENCOUNTER — Emergency Department (HOSPITAL_BASED_OUTPATIENT_CLINIC_OR_DEPARTMENT_OTHER)

## 2023-11-09 ENCOUNTER — Other Ambulatory Visit: Payer: Self-pay

## 2023-11-09 DIAGNOSIS — Z7984 Long term (current) use of oral hypoglycemic drugs: Secondary | ICD-10-CM | POA: Insufficient documentation

## 2023-11-09 DIAGNOSIS — Z8616 Personal history of COVID-19: Secondary | ICD-10-CM | POA: Diagnosis not present

## 2023-11-09 DIAGNOSIS — Z87891 Personal history of nicotine dependence: Secondary | ICD-10-CM | POA: Insufficient documentation

## 2023-11-09 DIAGNOSIS — I2510 Atherosclerotic heart disease of native coronary artery without angina pectoris: Secondary | ICD-10-CM | POA: Diagnosis not present

## 2023-11-09 DIAGNOSIS — J441 Chronic obstructive pulmonary disease with (acute) exacerbation: Secondary | ICD-10-CM | POA: Diagnosis not present

## 2023-11-09 DIAGNOSIS — Z79899 Other long term (current) drug therapy: Secondary | ICD-10-CM | POA: Insufficient documentation

## 2023-11-09 DIAGNOSIS — R0602 Shortness of breath: Secondary | ICD-10-CM | POA: Diagnosis present

## 2023-11-09 DIAGNOSIS — I5042 Chronic combined systolic (congestive) and diastolic (congestive) heart failure: Secondary | ICD-10-CM | POA: Diagnosis not present

## 2023-11-09 DIAGNOSIS — Z7982 Long term (current) use of aspirin: Secondary | ICD-10-CM | POA: Insufficient documentation

## 2023-11-09 DIAGNOSIS — I110 Hypertensive heart disease with heart failure: Secondary | ICD-10-CM | POA: Diagnosis not present

## 2023-11-09 LAB — CBC
HCT: 31.4 % — ABNORMAL LOW (ref 39.0–52.0)
Hemoglobin: 10.8 g/dL — ABNORMAL LOW (ref 13.0–17.0)
MCH: 31.4 pg (ref 26.0–34.0)
MCHC: 34.4 g/dL (ref 30.0–36.0)
MCV: 91.3 fL (ref 80.0–100.0)
Platelets: 171 10*3/uL (ref 150–400)
RBC: 3.44 MIL/uL — ABNORMAL LOW (ref 4.22–5.81)
RDW: 15.2 % (ref 11.5–15.5)
WBC: 4.8 10*3/uL (ref 4.0–10.5)
nRBC: 0 % (ref 0.0–0.2)

## 2023-11-09 LAB — BASIC METABOLIC PANEL WITH GFR
Anion gap: 12 (ref 5–15)
BUN: 8 mg/dL (ref 8–23)
CO2: 23 mmol/L (ref 22–32)
Calcium: 9.1 mg/dL (ref 8.9–10.3)
Chloride: 105 mmol/L (ref 98–111)
Creatinine, Ser: 1.05 mg/dL (ref 0.61–1.24)
GFR, Estimated: >60 mL/min (ref >60)
Glucose, Bld: 102 mg/dL — ABNORMAL HIGH (ref 70–99)
Potassium: 3.9 mmol/L (ref 3.5–5.1)
Sodium: 141 mmol/L (ref 135–145)

## 2023-11-09 LAB — TROPONIN T, HIGH SENSITIVITY: Troponin T High Sensitivity: <15 ng/L (ref <19)

## 2023-11-09 MED ORDER — PREDNISONE 10 MG PO TABS: 60.0000 mg | ORAL_TABLET | Freq: Every day | ORAL | 0 refills | Status: AC

## 2023-11-09 MED ORDER — IPRATROPIUM-ALBUTEROL 0.5-2.5 (3) MG/3ML IN SOLN: 3.0000 mL | Freq: Once | RESPIRATORY_TRACT | Status: DC

## 2023-11-09 MED ORDER — PREDNISONE 50 MG PO TABS
60.0000 mg | ORAL_TABLET | Freq: Once | ORAL | Status: AC
  Administered 2023-11-09: 60 mg via ORAL
  Filled 2023-11-09: qty 1

## 2023-11-09 MED ORDER — ALBUTEROL SULFATE HFA 108 (90 BASE) MCG/ACT IN AERS: 1.0000 | INHALATION_SPRAY | Freq: Four times a day (QID) | RESPIRATORY_TRACT | 0 refills | Status: AC | PRN

## 2023-11-09 NOTE — Telephone Encounter (Signed)
 Copied from CRM [id number]. Topic: Clinical - Red Word Triage >> Nov 09, 2023  3:29 PM Chad Moss wrote: Red Word that prompted transfer to Nurse Triage: Discolored mucus (yellow)   Chief Complaint: Productive cough with yellow mucous Symptoms: productive cough Frequency: 4 days Pertinent Negatives: Patient denies chest pain, difficulty breathing, fever,  Disposition: [x] ED /[] Urgent Care (no appt availability in office) / [] Appointment(In office/virtual)/ []  [place] [facility]/ [] Home Care/ [] Refused Recommended Disposition /[] [facility] [place] [facility]/ []  Follow-up with PCP Additional Notes: Patient's caretaker Lynnie Saucier called and advised that for the past 4 days the patient started coughing up yellow mucous.  Patient using Mucinex  but not getting better.  Patient also out of his Spiriva  for the past two months. Patient also needs an Albuterol  inhaler refilled.  Patient states that he had chest pain about 5 days ago and he is out of Nitroglycerin  that he was prescribed by his heart doctor. Patient's caregiver didn't know that he had chest pain a few days ago. Patient denies pain when taking a deep breath but the patient's caregiver states that the patient had a blood clot in his leg years ago.  Patient's caregiver states that the patient has had these symptoms in the past and she would call his pulmonologist and he has gotten a Zpak and Prednisone  and he gets better.  Patient's caregiver and the patient are advised that with the patient having a productive cough for about 5 days now with yellow mucous, their concern about it developing into pneumonia, currently being out of multiple medications, having an episode of chest pain and stating he is out of nitroglycerin , and this being the start of a 3 days weekend, and patient's current & past medical history--it is recommended that the patient goes to the Emergency Room to have these complaints of the productive cough and chest pain  evaluated. Patient's caregiver is agreeable with this recommendation and they are advised that if anything worsens they can call 911 at any point. Caregiver verbalized understanding.   Reason for Disposition  History of prior "blood clot" in leg or lungs (i.e., deep vein thrombosis, pulmonary embolism)  Answer Assessment - Initial Assessment Questions E2C2 Pulmonary Triage - Initial Assessment Questions "Chief Complaint (e.g., cough, sob, wheezing, fever, chills, sweat or additional symptoms) *Go to specific symptom protocol after initial questions. Productive cough with yellow mucous  "How long have symptoms been present?" yesterday  Have you tested for COVID or Flu? Note: If not, ask patient if a home test can be taken. If so, instruct patient to call back for positive results. No  MEDICINES:   "Have you used any OTC meds to help with symptoms?" Yes If yes, ask "What medications?" Mucinex   "Have you used your inhalers/maintenance medication?" No If yes, "What medications?" N/a  If inhaler, ask "How many puffs and how often?" Note: Review instructions on medication in the chart. Albuterol  inhaler--patient needs refill Spiriva ---needs refill per patient  OXYGEN: "Do you wear supplemental oxygen?" No If yes, "How many liters are you supposed to use?" N/a  "Do you monitor your oxygen levels?" No If yes, "What is your reading (oxygen level) today?" N/a  "What is your usual oxygen saturation reading?"  (Note: Pulmonary O2 sats should be 90% or greater) ----   1. ONSET: "When did the cough begin?"      yesterday 2. SEVERITY: "How bad is the cough today?"      Congestion cough 3. SPUTUM: "Describe the color of your sputum" (none, dry  cough; clear, white, yellow, green)     yellow 4. HEMOPTYSIS: "Are you coughing up any blood?" If so ask: "How much?" (flecks, streaks, tablespoons, etc.)     No 5. DIFFICULTY BREATHING: "Are you having difficulty breathing?" If Yes, ask:  "How bad is it?" (e.g., mild, moderate, severe)    - MILD: No SOB at rest, mild SOB with walking, speaks normally in sentences, can lie down, no retractions, pulse < 100.    - MODERATE: SOB at rest, SOB with minimal exertion and prefers to sit, cannot lie down flat, speaks in phrases, mild retractions, audible wheezing, pulse 100-120.    - SEVERE: Very SOB at rest, speaks in single words, struggling to breathe, sitting hunched forward, retractions, pulse > 120      No 6. FEVER: "Do you have a fever?" If Yes, ask: "What is your temperature, how was it measured, and when did it start?"     No 7. CARDIAC HISTORY: "Do you have any history of heart disease?" (e.g., heart attack, congestive heart failure)      Stent put in heart 8. LUNG HISTORY: "Do you have any history of lung disease?"  (e.g., pulmonary embolus, asthma, emphysema)     COPD 9. PE RISK FACTORS: "Do you have a history of blood clots?" (or: recent major surgery, recent prolonged travel, bedridden)     No      leg clots many years ago per caretaker 10. OTHER SYMPTOMS: "Do you have any other symptoms?" (e.g., runny nose, wheezing, chest pain)       Chest pain five days ago, runny nose with yellow phlegm 12. TRAVEL: "Have you traveled out of the country in the last month?" (e.g., travel history, exposures)       No  Answer Assessment - Initial Assessment Questions 1. LOCATION: "Where does it hurt?"       chest 2. RADIATION: "Does the pain go anywhere else?" (e.g., into neck, jaw, arms, back)     --- 3. ONSET: "When did the chest pain begin?" (Minutes, hours or days)      sunday 4. PATTERN: "Does the pain come and go, or has it been constant since it started?"  "Does it get worse with exertion?"      No pain at this time but patient states it happened Sunday and he realized he didn't have anymore nitroglycerin  tablets 5. DURATION: "How long does it last" (e.g., seconds, minutes, hours)     ---- 6. SEVERITY: "How bad is the pain?"   (e.g., Scale 1-10; mild, moderate, or severe)    - MILD (1-3): doesn't interfere with normal activities     - MODERATE (4-7): interferes with normal activities or awakens from sleep    - SEVERE (8-10): excruciating pain, unable to do any normal activities       =--- 7. CARDIAC RISK FACTORS: "Do you have any history of heart problems or risk factors for heart disease?" (e.g., angina, prior heart attack; diabetes, high blood pressure, high cholesterol, smoker, or strong family history of heart disease)     Significant   stent placed 8. PULMONARY RISK FACTORS: "Do you have any history of lung disease?"  (e.g., blood clots in lung, asthma, emphysema, birth control pills)     COPD 9. CAUSE: "What do you think is causing the chest pain?"     unknown 10. OTHER SYMPTOMS: "Do you have any other symptoms?" (e.g., dizziness, nausea, vomiting, sweating, fever, difficulty breathing, cough)       ----  Protocols used: Cough - Acute Productive-A-AH, Chest Pain-A-AH

## 2023-11-09 NOTE — Discharge Instructions (Signed)
 I sent your prescription for albuterol .  You can use this inhaler as needed.  I have sent you prednisone .  You can take 60 mg each day for the next 5 days.  Follow-up with your primary care doctor.

## 2023-11-09 NOTE — ED Provider Notes (Signed)
 [facility] Provider Note  CSN: [id number] Arrival date & time: 11/09/23 1902  Chief Complaint(s) Shortness of Breath and Chest Pain  HPI Chad Moss is a 68 y.o. male who is here today for cough and shortness of breath.  He has a history of COPD, not on any home O2.  Called his pulmonologist who was unable to see him due to it being a holiday weekend and then taking 1/2-day. They recommended he go to the ED.  He denies any fever or chills.  He denies any chest pain.   Past Medical History Past Medical History:  Diagnosis Date   Abnormal weight gain 03/09/2018   Acute sinusitis 08/09/2015   Allergic rhinitis    Allergic rhinitis 01/27/2009   Qualifier: Diagnosis of   By: Jens Molder CMA, Jennifer         Anxiety    AP (abdominal pain) 07/09/2014   Atelectasis    CAD (coronary artery disease), native coronary artery    9/18 PCI/DES x1 to mLCx, mild diffuse nonobstructive disease, EF 55% on Lv gram   Chronic bronchitis (HCC)    Chronic cough 11/19/2011   Chronic diastolic heart failure (HCC) 11/08/2020   COPD (chronic obstructive pulmonary disease) (HCC)    COPD with emphysema (HCC) 02/16/2022   Coronary artery calcification seen on CAT scan 01/12/2017   Coronary artery disease involving native coronary artery of native heart with angina pectoris (HCC) 03/13/2017   Cough 08/09/2015   COVID-19 virus detected 11/25/2018   11/19/2018-SARS-CoV-2-positive     11/19/2018-CT chest with contrast- left lower lobe airspace opacity is noted consistent with pneumonia, 5 mm right middle lobe nodule is noted no follow-up needed patient is low risk, noncontrast chest CT can be considered in 12 months if patient is high risk     Crohn's disease (HCC)    Crohn's disease without complication (HCC) 01/26/2009   Qualifier: History of   By: Jens Molder CMA, Jennifer         Depression    Diarrhea 11/19/2018   DIZZINESS, CHRONIC 01/27/2009   Qualifier:  Diagnosis of   By: Bertrum Brodie MD, Murali         DVT (deep venous thrombosis) (HCC)    RLE X 2   Dysphagia    Dyspnea    Edema 03/09/2018   Encounter for screening for lung cancer 10/09/2012   Shared decision making visit 01/05/2020  Baseline scan to be scheduled by wife.     Enlarged prostate    Essential hypertension 01/27/2009   Qualifier: Diagnosis of   By: Jens Molder CMA, Jennifer      IMO SNOMED Dx Update Oct 2024     Excessive daytime sleepiness 02/16/2022   Fever in adult 11/19/2018   Flu-like symptoms 08/09/2015   GERD (gastroesophageal reflux disease)    Glaucoma, both eyes    Heart murmur    History of stomach ulcers 1980s   Hyperlipidemia    Hypertension    "off RX for years now cause of coughing w/Lisinopril" (03/13/2017)   IBS (irritable bowel syndrome)    Inadequate community support 11/19/2018   Mixed hyperlipidemia 01/27/2009   Qualifier: Diagnosis of   By: Jens Molder CMA, Jennifer         Paranoid schizophrenia [facility])    PERIPHERAL VASCULAR DISEASE 01/27/2009   Qualifier: Diagnosis of   By: Jens Molder CMA, Jennifer         Peripheral vascular disease (HCC)    Pneumonia 1990s  Pre-diabetes    "one time" (03/13/2017)   SHORTNESS OF BREATH (SOB) 01/27/2009   Qualifier: Diagnosis of   By: Bertrum Brodie MD, Murali         Stroke (HCC) 04/2016   "eye stroke; left eye" (03/13/2017)   Syncopal episodes    Tobacco abuse 04/06/2011   Patient Active Problem List   Diagnosis Date Noted   Anxiety    Atelectasis    Chronic bronchitis (HCC)    Depression    DVT (deep venous thrombosis) (HCC)    Enlarged prostate    GERD (gastroesophageal reflux disease)    Glaucoma, both eyes    Heart murmur    History of stomach ulcers    IBS (irritable bowel syndrome)    Paranoid schizophrenia (HCC)    Pneumonia    Pre-diabetes    Syncopal episodes    COPD with emphysema (HCC) 02/16/2022   Excessive daytime sleepiness 02/16/2022   Chronic diastolic heart failure (HCC) 11/08/2020    COVID-19 virus detected 11/25/2018   Diarrhea 11/19/2018   Fever in adult 11/19/2018   Inadequate community support 11/19/2018   Edema 03/09/2018   Abnormal weight gain 03/09/2018   Essential hypertension    Coronary artery disease involving native coronary artery of native heart with angina pectoris (HCC) 03/13/2017   Coronary artery calcification seen on CAT scan 01/12/2017   Stroke (HCC) 04/2016   Cough 08/09/2015   Flu-like symptoms 08/09/2015   Acute sinusitis 08/09/2015   AP (abdominal pain) 07/09/2014   Other malaise and fatigue 09/06/2013   Encounter for screening for lung cancer 10/09/2012   Chronic cough 11/19/2011   Tobacco abuse 04/06/2011   Mixed hyperlipidemia 01/27/2009   Essential hypertension 01/27/2009   PERIPHERAL VASCULAR DISEASE 01/27/2009   Allergic rhinitis 01/27/2009   DIZZINESS, CHRONIC 01/27/2009   SHORTNESS OF BREATH (SOB) 01/27/2009   Crohn's disease without complication (HCC) 01/26/2009   Home Medication(s) Prior to Admission medications   Medication Sig Start Date End Date Taking? Authorizing Provider  albuterol  (VENTOLIN  HFA) 108 (90 Base) MCG/ACT inhaler Inhale 1-2 puffs into the lungs every 6 (six) hours as needed for wheezing or shortness of breath. 11/09/23  Yes Afton Horse T, DO  predniSONE  (DELTASONE ) 10 MG tablet Take 6 tablets (60 mg total) by mouth daily for 5 days. 11/09/23 11/14/23 Yes Afton Horse T, DO  acetaminophen  (TYLENOL ) 500 MG tablet Take 1 tablet (500 mg total) by mouth every 6 (six) hours as needed. 08/19/22   Deatra Face, MD  alfuzosin  (UROXATRAL ) 10 MG 24 hr tablet Take 10 mg by mouth at bedtime.    [provider]  amLODipine  (NORVASC ) 5 MG tablet TAKE 1 TABLET(5 MG) BY MOUTH DAILY 03/05/23   Lucendia Rusk, MD  aspirin  EC 81 MG tablet Take 81 mg by mouth daily.    [provider]  bimatoprost (LUMIGAN) 0.01 % SOLN Place 1 drop into both eyes at bedtime.    [provider]  brimonidine   (ALPHAGAN ) 0.15 % ophthalmic solution Place 1 drop into both eyes 2 (two) times daily.    [provider]  cholestyramine  (QUESTRAN ) 4 G packet Take 1 packet by mouth daily.    [provider]  clonazePAM  (KLONOPIN ) 1 MG tablet Take 1 mg by mouth at bedtime. Anxiety    [provider]  clopidogrel  (PLAVIX ) 75 MG tablet Take 1 tablet (75 mg total) by mouth daily. 03/05/23   Lucendia Rusk, MD  dicyclomine  (BENTYL ) 10 MG capsule Take 10 mg  by mouth 4 (four) times daily -  before meals and at bedtime.    [provider]  diphenhydrAMINE  (BENADRYL ) 25 MG tablet Take 50 mg by mouth at bedtime.     [provider]  doxycycline  (VIBRA -TABS) 100 MG tablet Take 1 tablet (100 mg total) by mouth 2 (two) times daily. 12/01/20   Maire Scot, MD  finasteride  (PROSCAR ) 5 MG tablet Take 5 mg by mouth daily.    [provider]  furosemide  (LASIX ) 40 MG tablet TAKE 1 TABLET BY MOUTH 4 TIMES A WEEK 11/14/22   Varanasi, Jayadeep S, MD  gabapentin (NEURONTIN) 100 MG capsule Take 100 mg by mouth 3 (three) times daily. 09/26/23 09/25/24  [provider]  HYDROcodone -acetaminophen  (NORCO/VICODIN) 5-325 MG tablet Take 1 tablet by mouth every 12 (twelve) hours as needed. 08/19/22   Deatra Face, MD  isosorbide  mononitrate (IMDUR ) 30 MG 24 hr tablet Take 1 tablet (30 mg total) by mouth daily. 03/05/23   Lucendia Rusk, MD  loxapine  (LOXITANE ) 10 MG capsule Take 10 mg by mouth 2 (two) times daily. 1 tab in AM and 2 tabs in PM    [provider]  meclizine (ANTIVERT) 25 MG tablet Take 25 mg by mouth 3 (three) times daily as needed for dizziness or nausea. 08/23/23 08/22/24  [provider]  mercaptopurine  (PURINETHOL ) 50 MG tablet Take 75 mg by mouth daily. 1 tablet and a half tablet Give on an empty stomach 1 hour before or 2 hours after meals. Caution: Chemotherapy.    [provider]  mesalamine  (PENTASA ) 250 MG CR capsule Take  1,000 mg by mouth 4 (four) times daily.     [provider]  metFORMIN (GLUCOPHAGE-XR) 500 MG 24 hr tablet Take 500 mg by mouth daily.    [provider]  methocarbamol  (ROBAXIN ) 500 MG tablet Take 1 tablet (500 mg total) by mouth 2 (two) times daily. 05/04/21   Palumbo, April, MD  mirtazapine  (REMERON ) 30 MG tablet Take 45 mg by mouth at bedtime. 1.5 tablets by mouth daily 01/08/17   [provider]  nitroGLYCERIN  (NITROSTAT ) 0.4 MG SL tablet Place 1 tablet (0.4 mg total) under the tongue every 5 (five) minutes x 3 doses as needed for chest pain. 10/24/23   Revankar, Micael Adas, MD  ondansetron  (ZOFRAN ) 8 MG tablet Take 8 mg by mouth every 8 (eight) hours as needed for nausea. 04/30/23   [provider]  ondansetron  (ZOFRAN -ODT) 8 MG disintegrating tablet Take 1 tablet (8 mg total) by mouth every 8 (eight) hours as needed for nausea. 08/19/22   Deatra Face, MD  pantoprazole  (PROTONIX ) 40 MG tablet TAKE 1 TABLET(40 MG) BY MOUTH TWICE DAILY 09/05/22   Varanasi, Jayadeep S, MD  PERMETHRIN EX 1 each by Other route in the morning. 08/23/23   [provider]  potassium chloride  (KLOR-CON ) 10 MEQ tablet Take 1 tablet (10 mEq total) by mouth 2 (two) times daily. 04/25/22   Lucendia Rusk, MD  propranolol (INDERAL) 10 MG tablet 10 mg daily at 6 (six) AM.    [provider]  rosuvastatin  (CRESTOR ) 20 MG tablet Take 1 tablet (20 mg total) by mouth daily. 03/05/23   Lucendia Rusk, MD  solifenacin (VESICARE) 5 MG tablet Take 5 mg by mouth daily. 01/29/22   [provider]  SPIRIVA  HANDIHALER 18 MCG inhalation capsule INHALE THE CONTENTS OF 1 CAPSULE VIA INHALATION DEVICE DAILY 05/02/22   Antonio Baumgarten, NP  tamsulosin [facility]) 0.4  MG CAPS capsule Take 0.4 mg by mouth at bedtime. 12/08/19   [provider]  timolol  (TIMOPTIC ) 0.5 % ophthalmic solution Place 1 drop into both eyes 2 (two) times daily. 07/21/16   [provider]   traMADol  (ULTRAM ) 50 MG tablet Take 50 mg by mouth as needed. 02/14/23   [provider]  traZODone  (DESYREL ) 50 MG tablet Take 150 mg by mouth at bedtime.    [provider]                                                                                                                                    Past Surgical History Past Surgical History:  Procedure Laterality Date   ABDOMINAL HERNIA REPAIR  1990s   COLECTOMY  1985; ?1989; 1996   "part of my ileum removed"   CORONARY ANGIOPLASTY WITH STENT PLACEMENT  03/13/2017   CORONARY STENT INTERVENTION N/A 03/13/2017   Procedure: CORONARY STENT INTERVENTION;  Surgeon: Lucendia Rusk, MD;  Location: [facility] INVASIVE CV LAB;  Service: Cardiovascular;  Laterality: N/A;   EYE SURGERY Right    "laser; for glaucoma" (03/13/2017)   FASCIOTOMY Right    HERNIA REPAIR     LAPAROSCOPIC CHOLECYSTECTOMY     LEFT HEART CATH AND CORONARY ANGIOGRAPHY N/A 03/13/2017   Procedure: LEFT HEART CATH AND CORONARY ANGIOGRAPHY;  Surgeon: Lucendia Rusk, MD;  Location: [facility] INVASIVE CV LAB;  Service: Cardiovascular;  Laterality: N/A;   PENILE PROSTHESIS IMPLANT  01/2011   Maximo Spar 02/01/2011   PERIPHERAL VASCULAR CATHETERIZATION Right 1999   "had blood clots in my leg; went in to open them up; dye went into leg; ended up having a fasiotomy"   TONSILLECTOMY     Family History Family History  Problem Relation Age of Onset   Hypertension Mother    Heart disease Mother    Hypertension Father    Heart disease Father    Lung cancer Brother        2006    Social History Social History   Tobacco Use   Smoking status: Former    Current packs/day: 2.00    Average packs/day: 2.0 packs/day for 55.0 years (109.9 ttl pk-yrs)    Types: Cigarettes    Start date: 11/18/1968   Smokeless tobacco: Never   Tobacco comments:    currently smoking 2ppd  Vaping Use   Vaping status: Never Used  Substance Use Topics   Alcohol use: No    Alcohol/week:  0.0 standard drinks of alcohol   Drug use: No   Allergies Benztropine, Codeine, Meperidine, Cyclobenzaprine, Penicillins, Sulfamethoxazole-trimethoprim, and Sulfonamide derivatives  Review of Systems Review of Systems  Physical Exam Vital Signs  I have reviewed the triage vital signs BP (!) 142/83 (BP Location: Right Arm)   Pulse 78   Temp 98.1 F (36.7 C)   Resp 16   Ht 5\' 10"  (1.778 m)   Wt 98.9 kg  SpO2 98%   BMI 31.28 kg/m   Physical Exam Vitals reviewed.  HENT:     Head: Normocephalic.  Pulmonary:     Effort: No tachypnea.     Breath sounds: Decreased breath sounds and wheezing present. No rhonchi or rales.  Chest:     Chest wall: No mass or deformity.  Neurological:     General: No focal deficit present.     Mental Status: He is alert.     ED Results and Treatments Labs (all labs ordered are listed, but only abnormal results are displayed) Labs Reviewed  BASIC METABOLIC PANEL WITH GFR - Abnormal; Notable for the following components:      Result Value   Glucose, Bld 102 (*)    All other components within normal limits  CBC - Abnormal; Notable for the following components:   RBC 3.44 (*)    Hemoglobin 10.8 (*)    HCT 31.4 (*)    All other components within normal limits  TROPONIN T, HIGH SENSITIVITY                                                                                                                          Radiology No results found.  Pertinent labs & imaging results that were available during my care of the patient were reviewed by me and considered in my medical decision making (see MDM for details).  Medications Ordered in ED Medications  ipratropium-albuterol  (DUONEB) 0.5-2.5 (3) MG/3ML nebulizer solution 3 mL (has no administration in time range)  predniSONE  (DELTASONE ) tablet 60 mg (has no administration in time range)                                                                                                                                      Procedures Procedures  (including critical care time)  Medical Decision Making / ED Course   This patient presents to the ED for concern of cough and shortness of breath, this involves an extensive number of treatment options, and is a complaint that carries with it a high risk of complications and morbidity.  The differential diagnosis includes COPD exacerbation, less likely ACS, less likely PE, less likely pneumonia.  MDM: On exam, patient overall well-appearing.  He has normal vital signs.  He has diminished air entry bilaterally.  He has not required any supplemental O2.  Believe this is likely a  COPD exacerbation.  Will provide the patient with a breathing treatment and some prednisone  here in the ED.  My independent review of the patient's chest x-ray shows no consolidation or pneumonia.  His troponin is negative.  My independent review of the patient's EKG shows no ST segment depressions or elevations, no evidence of acute ischemia.   Additional history obtained: -Additional history obtained from wife at bedside -External records from outside source obtained and reviewed including: Chart review including previous notes, labs, imaging, consultation notes   Lab Tests: -I ordered, reviewed, and interpreted labs.   The pertinent results include:   Labs Reviewed  BASIC METABOLIC PANEL WITH GFR - Abnormal; Notable for the following components:      Result Value   Glucose, Bld 102 (*)    All other components within normal limits  CBC - Abnormal; Notable for the following components:   RBC 3.44 (*)    Hemoglobin 10.8 (*)    HCT 31.4 (*)    All other components within normal limits  TROPONIN T, HIGH SENSITIVITY      EKG   EKG Interpretation Date/Time:  Friday Nov 09 2023 19:23:33 EDT Ventricular Rate:  73 PR Interval:  148 QRS Duration:  91 QT Interval:  404 QTC Calculation: 446 R Axis:   -45  Text Interpretation: Sinus rhythm Left anterior  fascicular block Confirmed by Afton Horse [phone]) on 11/09/2023 8:23:09 PM         Imaging Studies ordered: I ordered imaging studies including chest x-ray I independently visualized and interpreted imaging. I agree with the radiologist interpretation   Medicines ordered and prescription drug management: Meds ordered this encounter  Medications   ipratropium-albuterol  (DUONEB) 0.5-2.5 (3) MG/3ML nebulizer solution 3 mL   predniSONE  (DELTASONE ) tablet 60 mg   predniSONE  (DELTASONE ) 10 MG tablet    Sig: Take 6 tablets (60 mg total) by mouth daily for 5 days.    Dispense:  30 tablet    Refill:  0   albuterol  (VENTOLIN  HFA) 108 (90 Base) MCG/ACT inhaler    Sig: Inhale 1-2 puffs into the lungs every 6 (six) hours as needed for wheezing or shortness of breath.    Dispense:  1 each    Refill:  0    -I have reviewed the patients home medicines and have made adjustments as needed  Cardiac Monitoring: The patient was maintained on a cardiac monitor.  I personally viewed and interpreted the cardiac monitored which showed an underlying rhythm of: Normal sinus rhythm  Social Determinants of Health:  Factors impacting patients care include: Lack of access to primary care   Reevaluation: After the interventions noted above, I reevaluated the patient and found that they have :improved  Co morbidities that complicate the patient evaluation  Past Medical History:  Diagnosis Date   Abnormal weight gain 03/09/2018   Acute sinusitis 08/09/2015   Allergic rhinitis    Allergic rhinitis 01/27/2009   Qualifier: Diagnosis of   By: Jens Molder CMA, Jennifer         Anxiety    AP (abdominal pain) 07/09/2014   Atelectasis    CAD (coronary artery disease), native coronary artery    9/18 PCI/DES x1 to mLCx, mild diffuse nonobstructive disease, EF 55% on Lv gram   Chronic bronchitis (HCC)    Chronic cough 11/19/2011   Chronic diastolic heart failure (HCC) 11/08/2020   COPD (chronic  obstructive pulmonary disease) (HCC)    COPD with emphysema (HCC) 02/16/2022   Coronary artery calcification  seen on CAT scan 01/12/2017   Coronary artery disease involving native coronary artery of native heart with angina pectoris (HCC) 03/13/2017   Cough 08/09/2015   COVID-19 virus detected 11/25/2018   11/19/2018-SARS-CoV-2-positive     11/19/2018-CT chest with contrast- left lower lobe airspace opacity is noted consistent with pneumonia, 5 mm right middle lobe nodule is noted no follow-up needed patient is low risk, noncontrast chest CT can be considered in 12 months if patient is high risk     Crohn's disease (HCC)    Crohn's disease without complication (HCC) 01/26/2009   Qualifier: History of   By: Jens Molder CMA, Jennifer         Depression    Diarrhea 11/19/2018   DIZZINESS, CHRONIC 01/27/2009   Qualifier: Diagnosis of   By: Bertrum Brodie MD, Murali         DVT (deep venous thrombosis) (HCC)    RLE X 2   Dysphagia    Dyspnea    Edema 03/09/2018   Encounter for screening for lung cancer 10/09/2012   Shared decision making visit 01/05/2020  Baseline scan to be scheduled by wife.     Enlarged prostate    Essential hypertension 01/27/2009   Qualifier: Diagnosis of   By: Jens Molder CMA, Jennifer      IMO SNOMED Dx Update Oct 2024     Excessive daytime sleepiness 02/16/2022   Fever in adult 11/19/2018   Flu-like symptoms 08/09/2015   GERD (gastroesophageal reflux disease)    Glaucoma, both eyes    Heart murmur    History of stomach ulcers 1980s   Hyperlipidemia    Hypertension    "off RX for years now cause of coughing w/Lisinopril" (03/13/2017)   IBS (irritable bowel syndrome)    Inadequate community support 11/19/2018   Mixed hyperlipidemia 01/27/2009   Qualifier: Diagnosis of   By: Jens Molder CMA, Jennifer         Paranoid schizophrenia (HCC)    PERIPHERAL VASCULAR DISEASE 01/27/2009   Qualifier: Diagnosis of   By: Jens Molder CMA, Jennifer         Peripheral vascular disease (HCC)     Pneumonia 1990s   Pre-diabetes    "one time" (03/13/2017)   SHORTNESS OF BREATH (SOB) 01/27/2009   Qualifier: Diagnosis of   By: Bertrum Brodie MD, Murali         Stroke [facility]) 04/2016   "eye stroke; left eye" (03/13/2017)   Syncopal episodes    Tobacco abuse 04/06/2011      Dispostion: I considered admission for this patient, however he is appropriate for outpatient follow-up.     Final Clinical Impression(s) / ED Diagnoses Final diagnoses:  COPD exacerbation (HCC)     @PCDICTATION @    Afton Horse T, DO 11/09/23 2031

## 2023-11-09 NOTE — ED Triage Notes (Signed)
 Pt sts he was concern for a yellow productive cough, chest pain, and SOB. Called his pulmonologist for a prescription for medication including prednisone . Sts his pulmonologist wants his heart checked out. Denies chest pain. Sts he had one episode of chest pain on Sunday while watching TV, lasting 20 min. No additional s/s associated with CP.

## 2023-11-13 ENCOUNTER — Telehealth: Payer: Self-pay | Admitting: *Deleted

## 2023-11-13 MED ORDER — TIOTROPIUM BROMIDE MONOHYDRATE 18 MCG IN CAPS: 18.0000 ug | ORAL_CAPSULE | Freq: Every day | RESPIRATORY_TRACT | 1 refills | Status: DC

## 2023-11-13 NOTE — Telephone Encounter (Signed)
 Chad Moss to Me  Maire Scot, MD CJ   11/13/23 11:00 AM  Dr.Ramaswamy, your next hospital follow up is July 10th. Is it okay for the patient to wait that long?  Yes, please schedule the appt and can add him to wait cancellation list. Thanks!

## 2023-11-13 NOTE — Telephone Encounter (Signed)
 Front staff:  Please schedule patient with Dr. Bertrum Brodie.

## 2023-11-13 NOTE — Telephone Encounter (Signed)
 Copied from CRM [id number]. Topic: Clinical - Prescription Issue >> Nov 13, 2023 11:08 AM Chad Moss wrote: Reason for CRM: Patients care taker states the patient has not taken his SPIRIVA  HANDIHALER 18 MCG inhalation capsule in over 3 months and was in the Emergency Department on 5/23 for this - Please send this prescription to [facility] [place], [place] - [name] [address] Phone: [phone] Fax: [phone]  Patient has not been seen in this office since 02/07/22, he has a scheduled hospital follow up scheduled to see Dr. Bertrum Brodie on 12/27/2023.    Called and spoke with patient/caregiver Chad Moss), advised I would send in enough Spiriva  to get him to his office visit.  I provided the date and time of the appointment.  I let them know that we had not seen him since 02/07/22.  She was not aware that it had been that long and she was not aware that he was out of his spiriva .  I verified the pharmacy they wanted the prescription sent to  and script sent.   They verbalized understanding.  Nothing further needed.

## 2023-11-13 NOTE — Telephone Encounter (Signed)
 Routing to [employer] as FYI since pt seen by her last.

## 2023-11-13 NOTE — Telephone Encounter (Signed)
Patient has been scheduled and added to wait list.

## 2023-11-13 NOTE — Telephone Encounter (Signed)
 Looks like patient went to ED. Needs a regular follow up with Dr. Bertrum Brodie, been several years since he has seen MD

## 2023-11-28 ENCOUNTER — Ambulatory Visit: Admitting: Pulmonary Disease

## 2023-11-28 ENCOUNTER — Ambulatory Visit: Payer: Self-pay | Admitting: Pulmonary Disease

## 2023-11-28 MED ORDER — TIOTROPIUM BROMIDE MONOHYDRATE 18 MCG IN CAPS: 18.0000 ug | ORAL_CAPSULE | Freq: Every day | RESPIRATORY_TRACT | 1 refills | Status: DC

## 2023-11-28 MED ORDER — PREDNISONE 10 MG PO TABS: 40.0000 mg | ORAL_TABLET | Freq: Every day | ORAL | 0 refills | Status: AC

## 2023-11-28 MED ORDER — AZITHROMYCIN 250 MG PO TABS: ORAL_TABLET | ORAL | 0 refills | Status: AC

## 2023-11-28 NOTE — Addendum Note (Signed)
 Addended byPhyllis Breeze on: 11/28/2023 04:56 PM   Modules accepted: Orders

## 2023-11-28 NOTE — Telephone Encounter (Signed)
 FYI Only or Action Required?: FYI only for provider  Patient is followed in Pulmonology for COPD, last seen on 02/07/2022 by Antonio Baumgarten, NP. Called Nurse Triage reporting Cough, Nasal Congestion, and no improvement since treatment by ED. Symptoms began several weeks ago. Interventions attempted: OTC medications: mucinex , Prescription medications: prednisone , Rescue inhaler, and Increased fluids/rest. Symptoms are: unchanged.  Triage Disposition: See HCP Within 4 Hours (Or PCP Triage)  Patient/caregiver understands and will follow disposition?: Yes         Copied from CRM [id number]. Topic: Clinical - Red Word Triage >> Nov 28, 2023  9:43 AM Chad Moss wrote: Red Word that prompted transfer to Nurse Triage: Coughing up yellow and coughing solid food up. States we were supposed to rx a zpack and spirivia for him but never did. Seeking medical advisement. Per agent, caregiver on phone    Reason for Disposition  Wheezing is present  Answer Assessment - Initial Assessment Questions E2C2 Pulmonary Triage - Initial Assessment Questions Chief Complaint (e.g., cough, sob, wheezing, fever, chills, sweat or additional symptoms) *Go to specific symptom protocol after initial questions. Coughing yellow sputum and coughing solid food up Denies SOB Coughing a lot at night especially Wheezing at time, that's why I keep the mountain dew here No fever or chills, chest pain Dizziness the other day, rocked over like going to fall   How long have symptoms been present? Since before ED on 5/23   MEDICINES:   Have you used any OTC meds to help with symptoms? Yes If yes, ask What medications? Giving mucinex  and sputum coming up but told him he cannot eat dairy products because makes more phlegm, needs to stick to chicken noodle soup, not listening to me, it's coming up but still yellow No SOB  About the same since ED Gave prednisone  but no z-pak Not been on spiriva  in some months Not  feeling weaker at all  Have you used your inhalers/maintenance medication? Yes If yes, What medications? Used rescue inhaler 1x the other day, been telling him to use it since hot and humid  OXYGEN: Do you wear supplemental oxygen? No  Do you monitor your oxygen levels? No  3. SPUTUM: Describe the color of your sputum (none, dry cough; clear, white, yellow, green)     Yellow  4. HEMOPTYSIS: Are you coughing up any blood? If so ask: How much? (flecks, streaks, tablespoons, etc.)     Denies  7. CARDIAC HISTORY: Do you have any history of heart disease? (e.g., heart attack, congestive heart failure)      Significant  8. LUNG HISTORY: Do you have any history of lung disease?  (e.g., pulmonary embolus, asthma, emphysema)     Significant  9. PE RISK FACTORS: Do you have a history of blood clots? (or: recent major surgery, recent prolonged travel, bedridden)     Yes in legs many years ago  10. OTHER SYMPTOMS: Do you have any other symptoms? (e.g., runny nose, wheezing, chest pain)       Runny nose, regular amount of leg swelling for him  Pt stating he's not going anywhere today, caretaker still wanting to scheduled, nurse scheduled appt for earliest they could make it today with pulm Advised that Spiriva  was sent in on 5/27 to pharmacy  Protocols used: Cough - Acute Non-Productive-A-AH

## 2023-11-28 NOTE — Telephone Encounter (Signed)
 Patient was a no-show for his acute visit today and is being rescheduled Please let him know that prescription for Z-Pak, prednisone  for 5 days and Spiriva  has been sent to [facility] - [place], [place] to prevent repeat ED visit

## 2023-11-29 NOTE — Telephone Encounter (Signed)
 Pt's care giver, Bernadette(DPR) is aware of below message and voiced her understanding.  Nothing further needed.

## 2023-12-26 NOTE — Progress Notes (Unsigned)
 OV 12/26/2023  Subjective:  Patient ID: Chad Moss, male , DOB: 11/06/1955 , age 68 y.o. , MRN: [id number] , ADDRESS: [address] [place] [address] PCP Leonce Sherran IVAR JAYSON., MD Patient Care Team: Leonce Sherran IVAR JAYSON., MD as PCP - General (Adolescent Medicine) Dann Candyce RAMAN, MD as PCP - Cardiology (Cardiology)  This Provider for this visit: Treatment Team:  Attending Provider: Geronimo Amel, MD    12/26/2023 -  No chief complaint on file.    HPI Chad Moss 68 y.o. -    CT Chest data from date: ****  - personally visualized and independently interpreted : *** - my findings are: ***   PFT     Latest Ref Rng & Units 07/09/2014   10:45 AM  PFT Results  FVC-Pre L 3.71   FVC-Predicted Pre % 87   FVC-Post L 3.57   FVC-Predicted Post % 84   Pre FEV1/FVC % % 74   Post FEV1/FCV % % 78   FEV1-Pre L 2.74   FEV1-Predicted Pre % 83   FEV1-Post L 2.78   DLCO uncorrected ml/min/mmHg 19.41   DLCO UNC% % 56   DLVA Predicted % 90        LAB RESULTS last 96 hours No results found.       has a past medical history of Abnormal weight gain (03/09/2018), Acute sinusitis (08/09/2015), Allergic rhinitis, Allergic rhinitis (01/27/2009), Anxiety, AP (abdominal pain) (07/09/2014), Atelectasis, CAD (coronary artery disease), native coronary artery, Chronic bronchitis (HCC), Chronic cough (11/19/2011), Chronic diastolic heart failure (HCC) (94/76/7977), COPD (chronic obstructive pulmonary disease) (HCC), COPD with emphysema (HCC) (02/16/2022), Coronary artery calcification seen on CAT scan (01/12/2017), Coronary artery disease involving native coronary artery of native heart with angina pectoris (HCC) (03/13/2017), Cough (08/09/2015), COVID-19 virus detected (11/25/2018), Crohn's disease (HCC), Crohn's disease without complication (HCC) (01/26/2009), Depression, Diarrhea (11/19/2018), DIZZINESS, CHRONIC (01/27/2009), DVT (deep venous  thrombosis) (HCC), Dysphagia, Dyspnea, Edema (03/09/2018), Encounter for screening for lung cancer (10/09/2012), Enlarged prostate, Essential hypertension (01/27/2009), Excessive daytime sleepiness (02/16/2022), Fever in adult (11/19/2018), Flu-like symptoms (08/09/2015), GERD (gastroesophageal reflux disease), Glaucoma, both eyes, Heart murmur, History of stomach ulcers (1980s), Hyperlipidemia, Hypertension, IBS (irritable bowel syndrome), Inadequate community support (11/19/2018), Mixed hyperlipidemia (01/27/2009), Paranoid schizophrenia (HCC), PERIPHERAL VASCULAR DISEASE (01/27/2009), Peripheral vascular disease (HCC), Pneumonia (1990s), Pre-diabetes, SHORTNESS OF BREATH (SOB) (01/27/2009), Stroke (HCC) (04/2016), Syncopal episodes, and Tobacco abuse (04/06/2011).   reports that he has quit smoking. His smoking use included cigarettes. He started smoking about 55 years ago. He has a 110.2 pack-year smoking history. He has never used smokeless tobacco.  Past Surgical History:  Procedure Laterality Date   ABDOMINAL HERNIA REPAIR  1990s   COLECTOMY  1985; ?1989; 1996   part of my ileum removed   CORONARY ANGIOPLASTY WITH STENT PLACEMENT  03/13/2017   CORONARY STENT INTERVENTION N/A 03/13/2017   Procedure: CORONARY STENT INTERVENTION;  Surgeon: Dann Candyce RAMAN, MD;  Location: [facility] INVASIVE CV LAB;  Service: Cardiovascular;  Laterality: N/A;   EYE SURGERY Right    laser; for glaucoma (03/13/2017)   FASCIOTOMY Right    HERNIA REPAIR     LAPAROSCOPIC CHOLECYSTECTOMY     LEFT HEART CATH AND CORONARY ANGIOGRAPHY N/A 03/13/2017   Procedure: LEFT HEART CATH AND CORONARY ANGIOGRAPHY;  Surgeon: Dann Candyce RAMAN, MD;  Location: [facility] INVASIVE CV LAB;  Service: Cardiovascular;  Laterality: N/A;   PENILE PROSTHESIS IMPLANT  01/2011   Chad Moss 02/01/2011  PERIPHERAL VASCULAR CATHETERIZATION Right 1999   had blood clots in my leg; went in to open them up; dye went into leg; ended up having a fasiotomy    TONSILLECTOMY      Allergies  Allergen Reactions   Benztropine Anaphylaxis   Codeine Anaphylaxis   Meperidine Swelling   Cyclobenzaprine Other (See Comments)    Pt. Does not remember   Penicillins Rash    Has patient had a PCN reaction causing immediate rash, facial/tongue/throat swelling, SOB or lightheadedness with hypotension: Yes Has patient had a PCN reaction causing severe rash involving mucus membranes or skin necrosis: No Has patient had a PCN reaction that required hospitalization No Has patient had a PCN reaction occurring within the last 10 years: No If all of the above answers are NO, then may proceed with Cephalosporin use.    Sulfamethoxazole-Trimethoprim     REACTION: hives   Sulfonamide Derivatives     REACTION: hives    Immunization History  Administered Date(s) Administered   Fluad Quad(high Dose 65+) 06/09/2020   Influenza Split 04/06/2011, 03/12/2012, 03/31/2015   Influenza Whole 05/10/2009   Influenza,inj,Quad PF,6+ Mos 03/04/2013, 04/02/2017, 03/28/2018   Influenza-Unspecified 04/19/2014   PFIZER(Purple Top)SARS-COV-2 Vaccination 11/20/2018, 12/17/2019   Pneumococcal Conjugate-13 01/27/2009   Pneumococcal Polysaccharide-23 06/19/2008, 06/09/2020   Tdap 12/27/2017    Family History  Problem Relation Age of Onset   Hypertension Mother    Heart disease Mother    Hypertension Father    Heart disease Father    Lung cancer Brother        2006     Current Outpatient Medications:    acetaminophen  (TYLENOL ) 500 MG tablet, Take 1 tablet (500 mg total) by mouth every 6 (six) hours as needed., Disp: 30 tablet, Rfl: 0   albuterol  (VENTOLIN  HFA) 108 (90 Base) MCG/ACT inhaler, Inhale 1-2 puffs into the lungs every 6 (six) hours as needed for wheezing or shortness of breath., Disp: 1 each, Rfl: 0   alfuzosin  (UROXATRAL ) 10 MG 24 hr tablet, Take 10 mg by mouth at bedtime., Disp: , Rfl:    amLODipine  (NORVASC ) 5 MG tablet, TAKE 1 TABLET(5 MG) BY MOUTH DAILY,  Disp: 90 tablet, Rfl: 3   aspirin  EC 81 MG tablet, Take 81 mg by mouth daily., Disp: , Rfl:    azithromycin  (ZITHROMAX ) 250 MG tablet, Take 500 mg on day 1 and then 250 mg/day for the next 4 days, Disp: 6 tablet, Rfl: 0   bimatoprost (LUMIGAN) 0.01 % SOLN, Place 1 drop into both eyes at bedtime., Disp: , Rfl:    brimonidine  (ALPHAGAN ) 0.15 % ophthalmic solution, Place 1 drop into both eyes 2 (two) times daily., Disp: , Rfl:    cholestyramine  (QUESTRAN ) 4 G packet, Take 1 packet by mouth daily., Disp: , Rfl:    clonazePAM  (KLONOPIN ) 1 MG tablet, Take 1 mg by mouth at bedtime. Anxiety, Disp: , Rfl:    clopidogrel  (PLAVIX ) 75 MG tablet, Take 1 tablet (75 mg total) by mouth daily., Disp: 90 tablet, Rfl: 3   dicyclomine  (BENTYL ) 10 MG capsule, Take 10 mg by mouth 4 (four) times daily -  before meals and at bedtime., Disp: , Rfl:    diphenhydrAMINE  (BENADRYL ) 25 MG tablet, Take 50 mg by mouth at bedtime. , Disp: , Rfl:    doxycycline  (VIBRA -TABS) 100 MG tablet, Take 1 tablet (100 mg total) by mouth 2 (two) times daily., Disp: 10 tablet, Rfl: 0   finasteride  (PROSCAR ) 5 MG tablet, Take 5 mg  by mouth daily., Disp: , Rfl:    furosemide  (LASIX ) 40 MG tablet, TAKE 1 TABLET BY MOUTH 4 TIMES A WEEK, Disp: 55 tablet, Rfl: 3   gabapentin (NEURONTIN) 100 MG capsule, Take 100 mg by mouth 3 (three) times daily., Disp: , Rfl:    HYDROcodone -acetaminophen  (NORCO/VICODIN) 5-325 MG tablet, Take 1 tablet by mouth every 12 (twelve) hours as needed., Disp: 4 tablet, Rfl: 0   isosorbide  mononitrate (IMDUR ) 30 MG 24 hr tablet, Take 1 tablet (30 mg total) by mouth daily., Disp: 90 tablet, Rfl: 3   loxapine  (LOXITANE ) 10 MG capsule, Take 10 mg by mouth 2 (two) times daily. 1 tab in AM and 2 tabs in PM, Disp: , Rfl:    meclizine (ANTIVERT) 25 MG tablet, Take 25 mg by mouth 3 (three) times daily as needed for dizziness or nausea., Disp: , Rfl:    mercaptopurine  (PURINETHOL ) 50 MG tablet, Take 75 mg by mouth daily. 1 tablet and a  half tablet Give on an empty stomach 1 hour before or 2 hours after meals. Caution: Chemotherapy., Disp: , Rfl:    mesalamine  (PENTASA ) 250 MG CR capsule, Take 1,000 mg by mouth 4 (four) times daily. , Disp: , Rfl:    metFORMIN (GLUCOPHAGE-XR) 500 MG 24 hr tablet, Take 500 mg by mouth daily., Disp: , Rfl:    methocarbamol  (ROBAXIN ) 500 MG tablet, Take 1 tablet (500 mg total) by mouth 2 (two) times daily., Disp: 20 tablet, Rfl: 0   mirtazapine  (REMERON ) 30 MG tablet, Take 45 mg by mouth at bedtime. 1.5 tablets by mouth daily, Disp: , Rfl:    nitroGLYCERIN  (NITROSTAT ) 0.4 MG SL tablet, Place 1 tablet (0.4 mg total) under the tongue every 5 (five) minutes x 3 doses as needed for chest pain., Disp: 25 tablet, Rfl: 6   ondansetron  (ZOFRAN ) 8 MG tablet, Take 8 mg by mouth every 8 (eight) hours as needed for nausea., Disp: , Rfl:    ondansetron  (ZOFRAN -ODT) 8 MG disintegrating tablet, Take 1 tablet (8 mg total) by mouth every 8 (eight) hours as needed for nausea., Disp: 20 tablet, Rfl: 0   pantoprazole  (PROTONIX ) 40 MG tablet, TAKE 1 TABLET(40 MG) BY MOUTH TWICE DAILY, Disp: 180 tablet, Rfl: 3   PERMETHRIN EX, 1 each by Other route in the morning., Disp: , Rfl:    potassium chloride  (KLOR-CON ) 10 MEQ tablet, Take 1 tablet (10 mEq total) by mouth 2 (two) times daily., Disp: 180 tablet, Rfl: 3   propranolol (INDERAL) 10 MG tablet, 10 mg daily at 6 (six) AM., Disp: , Rfl:    rosuvastatin  (CRESTOR ) 20 MG tablet, Take 1 tablet (20 mg total) by mouth daily., Disp: 90 tablet, Rfl: 3   solifenacin (VESICARE) 5 MG tablet, Take 5 mg by mouth daily., Disp: , Rfl:    tamsulosin (FLOMAX) 0.4 MG CAPS capsule, Take 0.4 mg by mouth at bedtime., Disp: , Rfl:    timolol  (TIMOPTIC ) 0.5 % ophthalmic solution, Place 1 drop into both eyes 2 (two) times daily., Disp: , Rfl:    tiotropium (SPIRIVA ) 18 MCG inhalation capsule, Place 1 capsule (18 mcg total) into inhaler and inhale daily., Disp: 30 capsule, Rfl: 1   traMADol   (ULTRAM ) 50 MG tablet, Take 50 mg by mouth as needed., Disp: , Rfl:    traZODone  (DESYREL ) 50 MG tablet, Take 150 mg by mouth at bedtime., Disp: , Rfl:       Objective:   There were no vitals filed for this visit.  Estimated body mass index is 31.28 kg/m as calculated from the following:   Height as of 11/09/23: 5' 10 (1.778 m).   Weight as of 11/09/23: 218 lb (98.9 kg).  @WEIGHTCHANGE @  There were no vitals filed for this visit.   Physical Exam   General: No distress. *** O2 at rest: *** Cane present: *** Sitting in wheel chair: *** Frail: *** Obese: *** Neuro: Alert and Oriented x 3. GCS 15. Speech normal Psych: Pleasant Resp:  Barrel Chest - ***.  Wheeze - ***, Crackles - ***, No overt respiratory distress CVS: Normal heart sounds. Murmurs - *** Ext: Stigmata of Connective Tissue Disease - *** HEENT: Normal upper airway. PEERL +. No post nasal drip        Assessment:     No diagnosis found.     Plan:     There are no Patient Instructions on file for this visit.   FOLLOWUP No follow-ups on file.    SIGNATURE    Dr. Dorethia Cave, M.D., F.C.C.P,  Pulmonary and Critical Care Medicine Staff Physician, [facility] Center Director - Interstitial Lung Disease  Program  Pulmonary Fibrosis [facility] at [facility] [place], [place], [address]  Pager: [phone], If no answer or between  15:00h - 7:00h: call [phone] Telephone: [phone]  7:26 PM 12/26/2023   Moderate Complexity MDM OFFICE  2021 E/M guidelines, first released in 2021, with minor revisions added in 2023 and 2024 Must meet the requirements for 2 out of 3 dimensions to qualify.    Number and complexity of problems addressed Amount and/or complexity of data reviewed Risk of complications and/or morbidity  One or more chronic illness with mild exacerbation, OR progression, OR  side effects of treatment  Two or more stable chronic  illnesses  One undiagnosed new problem with uncertain prognosis  One acute illness with systemic symptoms   One Acute complicated injury Must meet the requirements for 1 of 3 of the categories)  Category 1: Tests and documents, historian  Any combination of 3 of the following:  Assessment requiring an independent historian  Review of prior external note(s) from each unique source  Review of results of each unique test  Ordering of each unique test    Category 2: Interpretation of tests   Independent interpretation of a test performed by another physician/other qualified health care professional (not separately reported)  Category 3: Discuss management/tests  Discussion of management or test interpretation with external physician/other qualified health care professional/appropriate source (not separately reported) Moderate risk of morbidity from additional diagnostic testing or treatment Examples only:  Prescription drug management  Decision regarding minor surgery with identfied patient or procedure risk factors  Decision regarding elective major surgery without identified patient or procedure risk factors  Diagnosis or treatment significantly limited by social determinants of health             HIGh Complexity  OFFICE   2021 E/M guidelines, first released in 2021, with minor revisions added in 2023. Must meet the requirements for 2 out of 3 dimensions to qualify.    Number and complexity of problems addressed Amount and/or complexity of data reviewed Risk of complications and/or morbidity  Severe exacerbation of chronic illness  Acute or chronic illnesses that may pose a threat to life or bodily function, e.g., multiple trauma, acute MI, pulmonary embolus, severe respiratory distress, progressive rheumatoid arthritis, psychiatric illness with potential threat to self or others,  peritonitis, acute renal failure, abrupt change in neurological status Must meet  the requirements for 2 of 3 of the categories)  Category 1: Tests and documents, historian  Any combination of 3 of the following:  Assessment requiring an independent historian  Review of prior external note(s) from each unique source  Review of results of each unique test  Ordering of each unique test    Category 2: Interpretation of tests    Independent interpretation of a test performed by another physician/other qualified health care professional (not separately reported)  Category 3: Discuss management/tests  Discussion of management or test interpretation with external physician/other qualified health care professional/appropriate source (not separately reported)  HIGH risk of morbidity from additional diagnostic testing or treatment Examples only:  Drug therapy requiring intensive monitoring for toxicity  Decision for elective major surgery with identified pateint or procedure risk factors  Decision regarding hospitalization or escalation of level of care  Decision for DNR or to de-escalate care   Parenteral controlled  substances            LEGEND - Independent interpretation involves the interpretation of a test for which there is a CPT code, and an interpretation or report is customary. When a review and interpretation of a test is performed and documented by the provider, but not separately reported (billed), then this would represent an independent interpretation. This report does not need to conform to the usual standards of a complete report of the test. This does not include interpretation of tests that do not have formal reports such as a complete blood count with differential and blood cultures. Examples would include reviewing a chest radiograph and documenting in the medical record an interpretation, but not separately reporting (billing) the interpretation of the chest radiograph.   An appropriate source includes professionals who are not  health care professionals but may be involved in the management of the patient, such as a Clinical research associate, upper officer, case manager or teacher, and does not include discussion with family or informal caregivers.    - SDOH: SDOH are the conditions in the environments where people are born, live, learn, work, play, worship, and age that affect a wide range of health, functioning, and quality-of-life outcomes and risks. (e.g., housing, food insecurity, transportation, etc.). SDOH-related Z codes ranging from Z55-Z65 are the ICD-10-CM diagnosis codes used to document SDOH data Z55 - Problems related to education and literacy Z56 - Problems related to employment and unemployment Z57 - Occupational exposure to risk factors Z58 - Problems related to physical environment Z59 - Problems related to housing and economic circumstances [phone] - Problems related to social environment [phone] - Problems related to upbringing [phone] - Other problems related to primary support group, including family circumstances Z40 - Problems related to certain psychosocial circumstances Z65 - Problems related to other psychosocial circumstances

## 2023-12-26 NOTE — Patient Instructions (Incomplete)
 Recommendations:  - Please start Spiriva  HandiHaler- take two puffs every morning - Use albuterol  inhaler/nebulizer every 4-6 hours as needed for breakthrough shortness of breath or wheezing  Orders: - In lab sleep study  Follow-up: - 6 to 8 weeks with Beth NP to review sleep study results

## 2023-12-27 ENCOUNTER — Ambulatory Visit (INDEPENDENT_AMBULATORY_CARE_PROVIDER_SITE_OTHER): Admitting: Internal Medicine

## 2023-12-27 ENCOUNTER — Encounter: Payer: Self-pay | Admitting: Internal Medicine

## 2023-12-27 VITALS — BP 110/62 | HR 72 | Temp 98.7°F | Ht 71.0 in | Wt 222.0 lb

## 2023-12-27 DIAGNOSIS — Z87891 Personal history of nicotine dependence: Secondary | ICD-10-CM | POA: Diagnosis not present

## 2023-12-27 DIAGNOSIS — J441 Chronic obstructive pulmonary disease with (acute) exacerbation: Secondary | ICD-10-CM

## 2023-12-27 DIAGNOSIS — J439 Emphysema, unspecified: Secondary | ICD-10-CM

## 2023-12-27 DIAGNOSIS — R053 Chronic cough: Secondary | ICD-10-CM

## 2023-12-27 DIAGNOSIS — R0600 Dyspnea, unspecified: Secondary | ICD-10-CM

## 2023-12-27 DIAGNOSIS — R0602 Shortness of breath: Secondary | ICD-10-CM | POA: Diagnosis not present

## 2023-12-27 DIAGNOSIS — R0601 Orthopnea: Secondary | ICD-10-CM

## 2023-12-27 DIAGNOSIS — R609 Edema, unspecified: Secondary | ICD-10-CM

## 2023-12-27 LAB — CBC WITH DIFFERENTIAL/PLATELET
Basophils Absolute: 0 K/uL (ref 0.0–0.1)
Basophils Relative: 0.4 % (ref 0.0–3.0)
Eosinophils Absolute: 0 K/uL (ref 0.0–0.7)
Eosinophils Relative: 0.7 % (ref 0.0–5.0)
HCT: 33 % — ABNORMAL LOW (ref 39.0–52.0)
Hemoglobin: 11.3 g/dL — ABNORMAL LOW (ref 13.0–17.0)
Lymphocytes Relative: 32.2 % (ref 12.0–46.0)
Lymphs Abs: 1.7 K/uL (ref 0.7–4.0)
MCHC: 34.4 g/dL (ref 30.0–36.0)
MCV: 95.4 fl (ref 78.0–100.0)
Monocytes Absolute: 0.4 K/uL (ref 0.1–1.0)
Monocytes Relative: 8.2 % (ref 3.0–12.0)
Neutro Abs: 3.1 K/uL (ref 1.4–7.7)
Neutrophils Relative %: 58.5 % (ref 43.0–77.0)
Platelets: 181 K/uL (ref 150.0–400.0)
RBC: 3.46 Mil/uL — ABNORMAL LOW (ref 4.22–5.81)
RDW: 17 % — ABNORMAL HIGH (ref 11.5–15.5)
WBC: 5.3 K/uL (ref 4.0–10.5)

## 2023-12-27 LAB — BRAIN NATRIURETIC PEPTIDE: Pro B Natriuretic peptide (BNP): 14 pg/mL (ref 0.0–100.0)

## 2023-12-27 MED ORDER — TRELEGY ELLIPTA 100-62.5-25 MCG/ACT IN AEPB: 1.0000 | INHALATION_SPRAY | Freq: Every day | RESPIRATORY_TRACT | 6 refills | Status: AC

## 2023-12-27 MED ORDER — TRELEGY ELLIPTA 100-62.5-25 MCG/ACT IN AEPB
1.0000 | INHALATION_SPRAY | Freq: Every day | RESPIRATORY_TRACT | Status: AC
Start: 2023-12-27 — End: ?

## 2023-12-27 NOTE — Addendum Note (Signed)
 Addended by: ROLANDA POWELL SAILOR on: 12/27/2023 11:52 AM   Modules accepted: Orders

## 2024-01-01 LAB — ALPHA-1 ANTITRYPSIN PHENOTYPE: A-1 Antitrypsin, Ser: 133 mg/dL (ref 83–199)

## 2024-01-05 ENCOUNTER — Ambulatory Visit: Payer: Self-pay | Admitting: Internal Medicine

## 2024-01-05 NOTE — Progress Notes (Signed)
 Mild anemia stable. Alpha 1 is normal

## 2024-01-07 ENCOUNTER — Other Ambulatory Visit: Payer: Self-pay | Admitting: Interventional Cardiology

## 2024-01-15 NOTE — Progress Notes (Signed)
 This encounter was created in error - please disregard.

## 2024-01-16 ENCOUNTER — Ambulatory Visit (HOSPITAL_BASED_OUTPATIENT_CLINIC_OR_DEPARTMENT_OTHER)
Admission: RE | Admit: 2024-01-16 | Discharge: 2024-01-16 | Disposition: A | Discharge status: Home / Self Care | Source: Ambulatory Visit | Attending: Internal Medicine | Admitting: Internal Medicine

## 2024-01-16 DIAGNOSIS — Z87891 Personal history of nicotine dependence: Secondary | ICD-10-CM | POA: Insufficient documentation

## 2024-01-16 DIAGNOSIS — J441 Chronic obstructive pulmonary disease with (acute) exacerbation: Secondary | ICD-10-CM | POA: Insufficient documentation

## 2024-01-16 DIAGNOSIS — J439 Emphysema, unspecified: Secondary | ICD-10-CM | POA: Insufficient documentation

## 2024-01-16 DIAGNOSIS — R0602 Shortness of breath: Secondary | ICD-10-CM | POA: Insufficient documentation

## 2024-01-16 DIAGNOSIS — R053 Chronic cough: Secondary | ICD-10-CM | POA: Diagnosis present

## 2024-01-22 ENCOUNTER — Other Ambulatory Visit: Payer: Self-pay | Admitting: Internal Medicine

## 2024-01-22 DIAGNOSIS — R609 Edema, unspecified: Secondary | ICD-10-CM

## 2024-01-22 DIAGNOSIS — R0602 Shortness of breath: Secondary | ICD-10-CM

## 2024-01-22 DIAGNOSIS — R0601 Orthopnea: Secondary | ICD-10-CM

## 2024-01-22 DIAGNOSIS — Z87891 Personal history of nicotine dependence: Secondary | ICD-10-CM

## 2024-01-22 DIAGNOSIS — R053 Chronic cough: Secondary | ICD-10-CM

## 2024-01-22 DIAGNOSIS — J439 Emphysema, unspecified: Secondary | ICD-10-CM

## 2024-01-22 DIAGNOSIS — R0600 Dyspnea, unspecified: Secondary | ICD-10-CM

## 2024-01-22 DIAGNOSIS — J441 Chronic obstructive pulmonary disease with (acute) exacerbation: Secondary | ICD-10-CM

## 2024-01-31 ENCOUNTER — Ambulatory Visit (HOSPITAL_BASED_OUTPATIENT_CLINIC_OR_DEPARTMENT_OTHER)
Admission: RE | Admit: 2024-01-31 | Discharge: 2024-01-31 | Disposition: A | Discharge status: Home / Self Care | Source: Ambulatory Visit | Attending: Internal Medicine | Admitting: Internal Medicine

## 2024-01-31 DIAGNOSIS — R0602 Shortness of breath: Secondary | ICD-10-CM | POA: Diagnosis not present

## 2024-01-31 DIAGNOSIS — R600 Localized edema: Secondary | ICD-10-CM

## 2024-01-31 DIAGNOSIS — R609 Edema, unspecified: Secondary | ICD-10-CM | POA: Diagnosis present

## 2024-01-31 DIAGNOSIS — R0600 Dyspnea, unspecified: Secondary | ICD-10-CM | POA: Insufficient documentation

## 2024-02-01 LAB — ECHOCARDIOGRAM COMPLETE
AR max vel: 2.79 cm2
AV Area VTI: 2.65 cm2
AV Area mean vel: 2.44 cm2
AV Mean grad: 5 mmHg
AV Peak grad: 8 mmHg
Ao pk vel: 1.41 m/s
Area-P 1/2: 3.27 cm2
Calc EF: 60.2 %
MV M vel: 1.92 m/s
MV Peak grad: 14.7 mmHg
S' Lateral: 2.6 cm
Single Plane A2C EF: 60.7 %
Single Plane A4C EF: 60.5 %

## 2024-02-04 ENCOUNTER — Ambulatory Visit: Payer: Self-pay | Admitting: Internal Medicine

## 2024-02-06 ENCOUNTER — Emergency Department (HOSPITAL_BASED_OUTPATIENT_CLINIC_OR_DEPARTMENT_OTHER)

## 2024-02-06 ENCOUNTER — Other Ambulatory Visit: Payer: Self-pay

## 2024-02-06 ENCOUNTER — Encounter (HOSPITAL_BASED_OUTPATIENT_CLINIC_OR_DEPARTMENT_OTHER): Payer: Self-pay | Admitting: Emergency Medicine

## 2024-02-06 ENCOUNTER — Emergency Department (HOSPITAL_BASED_OUTPATIENT_CLINIC_OR_DEPARTMENT_OTHER)
Admission: EM | Admit: 2024-02-06 | Discharge: 2024-02-06 | Disposition: A | Discharge status: Home / Self Care | Attending: Emergency Medicine | Admitting: Emergency Medicine

## 2024-02-06 DIAGNOSIS — J441 Chronic obstructive pulmonary disease with (acute) exacerbation: Secondary | ICD-10-CM | POA: Diagnosis not present

## 2024-02-06 DIAGNOSIS — Z7982 Long term (current) use of aspirin: Secondary | ICD-10-CM | POA: Diagnosis not present

## 2024-02-06 DIAGNOSIS — R059 Cough, unspecified: Secondary | ICD-10-CM | POA: Diagnosis present

## 2024-02-06 LAB — CBG MONITORING, ED: Glucose-Capillary: 104 mg/dL — ABNORMAL HIGH (ref 70–99)

## 2024-02-06 MED ORDER — PREDNISONE 50 MG PO TABS
60.0000 mg | ORAL_TABLET | Freq: Once | ORAL | Status: AC
  Administered 2024-02-06: 60 mg via ORAL
  Filled 2024-02-06: qty 1

## 2024-02-06 MED ORDER — ALBUTEROL SULFATE (2.5 MG/3ML) 0.083% IN NEBU: 2.5000 mg | INHALATION_SOLUTION | Freq: Four times a day (QID) | RESPIRATORY_TRACT | 12 refills | Status: AC | PRN

## 2024-02-06 MED ORDER — ALBUTEROL SULFATE HFA 108 (90 BASE) MCG/ACT IN AERS: 2.0000 | INHALATION_SPRAY | RESPIRATORY_TRACT | 2 refills | Status: DC | PRN

## 2024-02-06 MED ORDER — ALBUTEROL SULFATE (2.5 MG/3ML) 0.083% IN NEBU: 2.5000 mg | INHALATION_SOLUTION | Freq: Four times a day (QID) | RESPIRATORY_TRACT | 12 refills | Status: DC | PRN

## 2024-02-06 MED ORDER — ALBUTEROL SULFATE HFA 108 (90 BASE) MCG/ACT IN AERS: 2.0000 | INHALATION_SPRAY | RESPIRATORY_TRACT | 2 refills | Status: AC | PRN

## 2024-02-06 MED ORDER — DOXYCYCLINE HYCLATE 100 MG PO CAPS
100.0000 mg | ORAL_CAPSULE | Freq: Two times a day (BID) | ORAL | 0 refills | Status: AC
Start: 2024-02-06 — End: ?

## 2024-02-06 MED ORDER — IPRATROPIUM-ALBUTEROL 0.5-2.5 (3) MG/3ML IN SOLN
3.0000 mL | Freq: Once | RESPIRATORY_TRACT | Status: AC
  Administered 2024-02-06: 3 mL via RESPIRATORY_TRACT
  Filled 2024-02-06: qty 3

## 2024-02-06 MED ORDER — PREDNISONE 20 MG PO TABS: 40.0000 mg | ORAL_TABLET | Freq: Every day | ORAL | 0 refills | Status: DC

## 2024-02-06 MED ORDER — ALBUTEROL SULFATE (2.5 MG/3ML) 0.083% IN NEBU
2.5000 mg | INHALATION_SOLUTION | Freq: Once | RESPIRATORY_TRACT | Status: AC
  Administered 2024-02-06: 2.5 mg via RESPIRATORY_TRACT
  Filled 2024-02-06: qty 3

## 2024-02-06 MED ORDER — MESALAMINE ER 250 MG PO CPCR: 1000.0000 mg | ORAL_CAPSULE | Freq: Four times a day (QID) | ORAL | 0 refills | Status: AC

## 2024-02-06 MED ORDER — DOXYCYCLINE HYCLATE 100 MG PO CAPS: 100.0000 mg | ORAL_CAPSULE | Freq: Two times a day (BID) | ORAL | 0 refills | Status: DC

## 2024-02-06 NOTE — ED Triage Notes (Signed)
 Pt states SOB  X 3 weeks, seen by PCP and given meds, states is better than it was. State is worse at night. Hx of COPD. Has a productive cough.

## 2024-02-06 NOTE — ED Provider Notes (Signed)
 [place] [facility] Provider Note   CSN: [id number] Arrival date & time: 02/06/24  0444     Patient presents with: Shortness of Breath   Chad Moss is a 68 y.o. male.   Presents to the department for evaluation of cough and shortness of breath.  He reports a history of COPD.  For a week or so he has been having a scratchy throat and cough productive of yellow sputum.  He saw his primary care doctor and had his Spiriva  changed to Trelegy.       Prior to Admission medications   Medication Sig Start Date End Date Taking? Authorizing Provider  doxycycline  (VIBRAMYCIN ) 100 MG capsule Take 1 capsule (100 mg total) by mouth 2 (two) times daily. 02/06/24  Yes Britanni Yarde, Lonni PARAS, MD  predniSONE  (DELTASONE ) 20 MG tablet Take 2 tablets (40 mg total) by mouth daily with breakfast. 02/06/24  Yes Izzie Geers, Lonni PARAS, MD  acetaminophen  (TYLENOL ) 500 MG tablet Take 1 tablet (500 mg total) by mouth every 6 (six) hours as needed. 08/19/22   Charlyn Sora, MD  albuterol  (VENTOLIN  HFA) 108 (90 Base) MCG/ACT inhaler Inhale 1-2 puffs into the lungs every 6 (six) hours as needed for wheezing or shortness of breath. 11/09/23   Mannie Pac T, DO  alfuzosin  (UROXATRAL ) 10 MG 24 hr tablet Take 10 mg by mouth at bedtime.    [provider]  amLODipine  (NORVASC ) 5 MG tablet TAKE 1 TABLET(5 MG) BY MOUTH DAILY 03/05/23   Dann Candyce RAMAN, MD  aspirin  EC 81 MG tablet Take 81 mg by mouth daily.    [provider]  azithromycin  (ZITHROMAX ) 250 MG tablet Take 500 mg on day 1 and then 250 mg/day for the next 4 days 11/28/23   Mannam, Praveen, MD  bimatoprost (LUMIGAN) 0.01 % SOLN Place 1 drop into both eyes at bedtime.    [provider]  brimonidine  (ALPHAGAN ) 0.15 % ophthalmic solution Place 1 drop into both eyes 2 (two) times daily.    [provider]  cholestyramine  (QUESTRAN ) 4 G packet Take 1 packet by mouth daily.     [provider]  clonazePAM  (KLONOPIN ) 1 MG tablet Take 1 mg by mouth at bedtime. Anxiety    [provider]  clopidogrel  (PLAVIX ) 75 MG tablet Take 1 tablet (75 mg total) by mouth daily. 03/05/23   Dann Candyce RAMAN, MD  dicyclomine  (BENTYL ) 10 MG capsule Take 10 mg by mouth 4 (four) times daily -  before meals and at bedtime.    [provider]  diphenhydrAMINE  (BENADRYL ) 25 MG tablet Take 50 mg by mouth at bedtime.     [provider]  finasteride  (PROSCAR ) 5 MG tablet Take 5 mg by mouth daily.    [provider]  Fluticasone -Umeclidin-Vilant (TRELEGY ELLIPTA ) 100-62.5-25 MCG/ACT AEPB Take 1 puff by mouth daily. Stop spiriva , 12/27/23   Geronimo Amel, MD  Fluticasone -Umeclidin-Vilant (TRELEGY ELLIPTA ) 100-62.5-25 MCG/ACT AEPB Inhale 1 puff into the lungs daily. 12/27/23   Geronimo Amel, MD  furosemide  (LASIX ) 40 MG tablet Take 1 tablet (40 mg total) by mouth four (4) times a week. 01/09/24   Revankar, Rajan R, MD  gabapentin (NEURONTIN) 100 MG capsule Take 100 mg by mouth 3 (three) times daily. 09/26/23 09/25/24  [provider]  HYDROcodone -acetaminophen  (NORCO/VICODIN) 5-325 MG tablet Take 1 tablet by mouth every 12 (twelve) hours as needed. 08/19/22   Charlyn Sora, MD  isosorbide  mononitrate (IMDUR ) 30 MG 24 hr  tablet Take 1 tablet (30 mg total) by mouth daily. 03/05/23   Dann Candyce RAMAN, MD  loxapine  (LOXITANE ) 10 MG capsule Take 10 mg by mouth 2 (two) times daily. 1 tab in AM and 2 tabs in PM    [provider]  meclizine (ANTIVERT) 25 MG tablet Take 25 mg by mouth 3 (three) times daily as needed for dizziness or nausea. 08/23/23 08/22/24  [provider]  mercaptopurine  (PURINETHOL ) 50 MG tablet Take 75 mg by mouth daily. 1 tablet and a half tablet Give on an empty stomach 1 hour before or 2 hours after meals. Caution: Chemotherapy.    [provider]  mesalamine  (PENTASA ) 250 MG CR capsule Take 1,000 mg by  mouth 4 (four) times daily.     [provider]  metFORMIN (GLUCOPHAGE-XR) 500 MG 24 hr tablet Take 500 mg by mouth daily.    [provider]  methocarbamol  (ROBAXIN ) 500 MG tablet Take 1 tablet (500 mg total) by mouth 2 (two) times daily. 05/04/21   Palumbo, April, MD  mirtazapine  (REMERON ) 30 MG tablet Take 45 mg by mouth at bedtime. 1.5 tablets by mouth daily 01/08/17   [provider]  nitroGLYCERIN  (NITROSTAT ) 0.4 MG SL tablet Place 1 tablet (0.4 mg total) under the tongue every 5 (five) minutes x 3 doses as needed for chest pain. 10/24/23   Revankar, Jennifer SAUNDERS, MD  ondansetron  (ZOFRAN ) 8 MG tablet Take 8 mg by mouth every 8 (eight) hours as needed for nausea. 04/30/23   [provider]  ondansetron  (ZOFRAN -ODT) 8 MG disintegrating tablet Take 1 tablet (8 mg total) by mouth every 8 (eight) hours as needed for nausea. 08/19/22   Charlyn Sora, MD  pantoprazole  (PROTONIX ) 40 MG tablet TAKE 1 TABLET(40 MG) BY MOUTH TWICE DAILY 09/05/22   Varanasi, Jayadeep S, MD  PERMETHRIN EX 1 each by Other route in the morning. 08/23/23   [provider]  potassium chloride  (KLOR-CON ) 10 MEQ tablet Take 1 tablet (10 mEq total) by mouth 2 (two) times daily. 04/25/22   Dann Candyce RAMAN, MD  propranolol (INDERAL) 10 MG tablet 10 mg daily at 6 (six) AM.    [provider]  rosuvastatin  (CRESTOR ) 20 MG tablet Take 1 tablet (20 mg total) by mouth daily. 03/05/23   Dann Candyce RAMAN, MD  solifenacin (VESICARE) 5 MG tablet Take 5 mg by mouth daily. 01/29/22   [provider]  tamsulosin (FLOMAX) 0.4 MG CAPS capsule Take 0.4 mg by mouth at bedtime. 12/08/19   [provider]  timolol  (TIMOPTIC ) 0.5 % ophthalmic solution Place 1 drop into both eyes 2 (two) times daily. 07/21/16   [provider]  traMADol  (ULTRAM ) 50 MG tablet Take 50 mg by mouth as needed. 02/14/23   [provider]  traZODone  (DESYREL ) 50 MG tablet Take 150 mg by mouth at  bedtime.    [provider]    Allergies: Benztropine, Codeine, Meperidine, Cyclobenzaprine, Penicillins, Sulfamethoxazole-trimethoprim, and Sulfonamide derivatives    Review of Systems  Updated Vital Signs BP (!) 153/81 (BP Location: Left Arm)   Pulse 74   Temp 97.8 F (36.6 C) (Oral)   Resp 16 Comment: Counted  Ht 5' 7 (1.702 m)   Wt 100.2 kg   SpO2 98%   BMI 34.61 kg/m   Physical Exam Vitals and nursing note reviewed.  Constitutional:      General: He is not in acute distress.    Appearance: He is well-developed.  HENT:  Head: Normocephalic and atraumatic.     Mouth/Throat:     Mouth: Mucous membranes are moist.  Eyes:     General: Vision grossly intact. Gaze aligned appropriately.     Extraocular Movements: Extraocular movements intact.     Conjunctiva/sclera: Conjunctivae normal.  Cardiovascular:     Rate and Rhythm: Normal rate and regular rhythm.     Pulses: Normal pulses.     Heart sounds: Normal heart sounds, S1 normal and S2 normal. No murmur heard.    No friction rub. No gallop.  Pulmonary:     Effort: Pulmonary effort is normal. No respiratory distress.     Breath sounds: Examination of the right-middle field reveals wheezing. Examination of the right-lower field reveals wheezing. Examination of the left-lower field reveals wheezing. Decreased breath sounds and wheezing present.  Abdominal:     Palpations: Abdomen is soft.     Tenderness: There is no abdominal tenderness. There is no guarding or rebound.     Hernia: No hernia is present.  Musculoskeletal:        General: No swelling.     Cervical back: Full passive range of motion without pain, normal range of motion and neck supple. No pain with movement, spinous process tenderness or muscular tenderness. Normal range of motion.     Right lower leg: No edema.     Left lower leg: No edema.  Skin:    General: Skin is warm and dry.     Capillary Refill: Capillary refill takes less than 2  seconds.     Findings: No ecchymosis, erythema, lesion or wound.  Neurological:     Mental Status: He is alert and oriented to person, place, and time.     GCS: GCS eye subscore is 4. GCS verbal subscore is 5. GCS motor subscore is 6.     Cranial Nerves: Cranial nerves 2-12 are intact.     Sensory: Sensation is intact.     Motor: Motor function is intact. No weakness or abnormal muscle tone.     Coordination: Coordination is intact.  Psychiatric:        Mood and Affect: Mood normal.        Speech: Speech normal.        Behavior: Behavior normal.     (all labs ordered are listed, but only abnormal results are displayed) Labs Reviewed - No data to display  EKG: None  Radiology: DG Chest 2 View Result Date: 02/06/2024 CLINICAL DATA:  Shortness of breath. EXAM: CHEST - 2 VIEW COMPARISON:  11/09/2023 FINDINGS: The lungs are clear without focal pneumonia, edema, pneumothorax or pleural effusion. Cardiopericardial silhouette is at upper limits of normal for size. No acute bony abnormality. IMPRESSION: No active cardiopulmonary disease. Electronically Signed   By: Camellia Candle M.D.   On: 02/06/2024 05:37     Procedures   Medications Ordered in the ED  predniSONE  (DELTASONE ) tablet 60 mg (has no administration in time range)  ipratropium-albuterol  (DUONEB) 0.5-2.5 (3) MG/3ML nebulizer solution 3 mL (has no administration in time range)  albuterol  (PROVENTIL ) (2.5 MG/3ML) 0.083% nebulizer solution 2.5 mg (has no administration in time range)                                    Medical Decision Making Amount and/or Complexity of Data Reviewed Radiology: ordered and independent interpretation performed. Decision-making details documented in ED Course.  Risk Prescription drug management.  Differential Diagnosis considered includes, but not limited to: COPD exacerbation; Bronchitis; Pneumonia; CHF; ACS; PE  Presents with worsening shortness of breath over a period of 1 week  associated with cough and congestion.  Patient does have COPD.  Echo performed within the last week was normal.  No history of heart failure.  He does have a history of CAD but he is not experiencing any chest pain, but current presentation does not suggest cardiac etiology.  An x-ray today does not show evidence of pneumonia.  Lung examination, however, reveals decreased air movement with wheezing, more on the right.  This is consistent with COPD exacerbation.  He is not in any distress or hypoxic.  No concern for CO2 retention.  Will treat with prednisone , antibiotics, albuterol .     Final diagnoses:  COPD exacerbation [facility])    ED Discharge Orders          Ordered    predniSONE  (DELTASONE ) 20 MG tablet  Daily with breakfast        02/06/24 0545    doxycycline  (VIBRAMYCIN ) 100 MG capsule  2 times daily        02/06/24 0545               Haze Lonni PARAS, MD 02/06/24 [phone]

## 2024-02-19 ENCOUNTER — Encounter

## 2024-02-24 NOTE — Progress Notes (Signed)
  IMPRESSION: 1. No acute intrathoracic pathology. 2. Coronary vascular calcification. 3.  Aortic Atherosclerosis (ICD10-I70.0).     Electronically Signed   By: Vanetta Chou M.D.   On: 01/24/2024 21:09

## 2024-02-25 ENCOUNTER — Encounter

## 2024-02-26 ENCOUNTER — Ambulatory Visit: Admitting: Internal Medicine

## 2024-02-26 DIAGNOSIS — Z87891 Personal history of nicotine dependence: Secondary | ICD-10-CM

## 2024-02-26 DIAGNOSIS — J441 Chronic obstructive pulmonary disease with (acute) exacerbation: Secondary | ICD-10-CM

## 2024-02-26 DIAGNOSIS — R053 Chronic cough: Secondary | ICD-10-CM

## 2024-02-26 DIAGNOSIS — R0602 Shortness of breath: Secondary | ICD-10-CM

## 2024-02-26 DIAGNOSIS — J439 Emphysema, unspecified: Secondary | ICD-10-CM

## 2024-02-26 LAB — PULMONARY FUNCTION TEST
DL/VA % pred: 81 %
DL/VA: 3.27 ml/min/mmHg/L
DLCO cor % pred: 57 %
DLCO cor: 15.68 ml/min/mmHg
DLCO unc % pred: 57 %
DLCO unc: 15.68 ml/min/mmHg
FEF 25-75 Post: 2.29 L/s
FEF 25-75 Pre: 1.68 L/s
FEF2575-%Change-Post: 36 %
FEF2575-%Pred-Post: 87 %
FEF2575-%Pred-Pre: 64 %
FEV1-%Change-Post: 7 %
FEV1-%Pred-Post: 67 %
FEV1-%Pred-Pre: 62 %
FEV1-Post: 2.31 L
FEV1-Pre: 2.14 L
FEV1FVC-%Change-Post: 0 %
FEV1FVC-%Pred-Pre: 100 %
FEV6-%Change-Post: 9 %
FEV6-%Pred-Post: 70 %
FEV6-%Pred-Pre: 64 %
FEV6-Post: 3.13 L
FEV6-Pre: 2.86 L
FEV6FVC-%Change-Post: 0 %
FEV6FVC-%Pred-Post: 104 %
FEV6FVC-%Pred-Pre: 104 %
FVC-%Change-Post: 8 %
FVC-%Pred-Post: 67 %
FVC-%Pred-Pre: 61 %
FVC-Post: 3.16 L
FVC-Pre: 2.9 L
Post FEV1/FVC ratio: 73 %
Post FEV6/FVC ratio: 99 %
Pre FEV1/FVC ratio: 74 %
Pre FEV6/FVC Ratio: 99 %
RV % pred: 78 %
RV: 1.98 L
TLC % pred: 67 %
TLC: 4.95 L

## 2024-02-26 NOTE — Progress Notes (Signed)
 Full PFT performed today.

## 2024-02-26 NOTE — Patient Instructions (Signed)
 Full PFT performed today.

## 2024-02-29 ENCOUNTER — Encounter: Payer: Self-pay | Admitting: Primary Care

## 2024-02-29 ENCOUNTER — Ambulatory Visit (INDEPENDENT_AMBULATORY_CARE_PROVIDER_SITE_OTHER): Admitting: Primary Care

## 2024-02-29 VITALS — BP 132/70 | HR 99 | Temp 97.9°F | Ht 67.0 in | Wt 222.0 lb

## 2024-02-29 DIAGNOSIS — J42 Unspecified chronic bronchitis: Secondary | ICD-10-CM

## 2024-02-29 DIAGNOSIS — Z6834 Body mass index (BMI) 34.0-34.9, adult: Secondary | ICD-10-CM

## 2024-02-29 DIAGNOSIS — E669 Obesity, unspecified: Secondary | ICD-10-CM

## 2024-02-29 DIAGNOSIS — J449 Chronic obstructive pulmonary disease, unspecified: Secondary | ICD-10-CM

## 2024-02-29 DIAGNOSIS — G4700 Insomnia, unspecified: Secondary | ICD-10-CM

## 2024-02-29 DIAGNOSIS — Z87891 Personal history of nicotine dependence: Secondary | ICD-10-CM

## 2024-02-29 DIAGNOSIS — I2510 Atherosclerotic heart disease of native coronary artery without angina pectoris: Secondary | ICD-10-CM

## 2024-02-29 DIAGNOSIS — D649 Anemia, unspecified: Secondary | ICD-10-CM

## 2024-02-29 MED ORDER — TRAZODONE HCL 150 MG PO TABS: 150.0000 mg | ORAL_TABLET | Freq: Every evening | ORAL | 3 refills | Status: AC | PRN

## 2024-02-29 NOTE — Patient Instructions (Addendum)
  VISIT SUMMARY: Today, you came in for a follow-up visit to discuss your chronic cough and respiratory symptoms. We reviewed your recent CT scan and breathing test results, and discussed your current medications and overall health.  YOUR PLAN: -CHRONIC BRONCHITIS: Chronic bronchitis is a long-term inflammation of the airways in the lungs. Your recent cough with yellow mucus was treated with a Z-Pak and prednisone , and you found Trelegy to be effective in managing your symptoms. Continue using Trelegy and remember to rinse your mouth after each use to prevent thrush.  -OBESITY: Obesity can contribute to restrictive lung disease, which means your lungs have difficulty expanding fully. This was indicated by your breathing test results. Encourage weighty loss.   -ANEMIA: Anemia is a condition where you have a lower than normal number of red blood cells. Your mild anemia has been stable with a hemoglobin level of 11.3 over the past two years.  -CORONARY ARTERY DISEASE: Coronary artery disease involves the buildup of plaque in the arteries of your heart. Your CT scan showed coronary calcifications and plaque buildup in the aorta. You are currently managing this with Crestor  for cholesterol control.  -INSOMNIA: Insomnia is difficulty falling or staying asleep. You are currently managing this with trazodone  and mirtazapine , which have helped improve your sleep. We will refill your trazodone  prescription.  -SLEEP STUDY FOLLOW-UP: Your previous sleep study showed no significant sleep apnea, but the results may not be accurate due to limited sleep during the test. We are awaiting the results of your overnight oximetry test to check your oxygen levels during sleep. Please contact the clinic with the results via MyChart or phone. Follow up in six months or sooner if needed.  INSTRUCTIONS: Please contact the clinic with the results of your overnight oximetry test via MyChart or phone. Follow up in six months or  sooner if needed.

## 2024-02-29 NOTE — Progress Notes (Signed)
 @Patient  ID: Chad Moss, male    DOB: Dec 31, 1955, 68 y.o.   MRN: [id number]  Chief Complaint  Patient presents with   COPD    Referring provider: Leonce Sherran IVAR JAYSON.,*  HPI: 68 year old male, current every day smoker. PMH significant for COPD, emphysema, allergic rhinitis, HTN, CAD, crohns disease. Patient of Dr. Geronimo, last seen on 12/27/23. Following with cancer screening program.   Previous [facility] pulmonary encounter: 12/27/2023 -   Chief Complaint  Patient presents with   Follow-up    Pt is coughing up thick yellow phlegm. PT states that it is difficult to cough up. Also experiencing SOB.    [name] 68 y.o. -gentleman with COPD not otherwise specified.  Is a former smoker quit in 2019.  I personally not seen him since 2019.  He is here with his daughter.  They states because they moved to [facility].  And then in 2023 saw nurse practitioner.  They are here as a follow-up from emergency room visit.  They attest that he is taking Spiriva  but sometimes is not compliant.  Both say that smoking is in remission.  It appears in the last few years she has had worsening pedal edema.  He also has orthopnea sleeping in a recliner.  Then in the last month or so he has had chronic sputum production with yellow color.  He says that his chest is tighter than usual.  End of May 2020 for ended up in the ER got antibiotics and prednisone  chest x-ray reviewed at that time was clear.  Then last week again because of persistent sputum saw primary care given Z-Pak and prednisone  which he is finishing up.  Still symptomatic.  Still with ongoing orthopnea and paroxysmal nocturnal dyspnea without change there is no chest pain.  There is no hemoptysis there is no wheezing.  Smoking is in remission.  Last office visit with me in 2019 Last office visit nurse practitioner 2023 Last CT chest 2023 Last echo 2023   02/29/2024- Interim hx  Discussed the use of AI scribe software for clinical note  transcription with the patient, who gave verbal consent to proceed. History of Present Illness [name] is a 68 year old male with chronic cough and respiratory symptoms who presents for follow-up.  He has a chronic cough that began a couple of months ago, characterized by the production of yellow mucus. He completed a course of a Z-Pak and prednisone , which provided some relief. He notes improvement in his symptoms with the use of Trelegy, which he finds more effective than his previous medication, Spiriva . No chest tightness or wheezing, with cough being the primary respiratory symptom.  A CT scan of the chest in July showed clear lungs with no intrathoracic pathology but revealed coronary calcifications and plaque buildup in the aorta. Pulmonary function testing in September indicated moderate restriction in his lungs with a diffusion defect, but no evidence of obstructive lung disease. Alpha 1 test was normal, phenotype MM.   He has a history of leg and foot swelling and experiences trouble breathing when sleeping in a recliner. A sleep study conducted in 2023 showed no significant sleep apnea, though the results may have been affected by limited sleep during the test. He is currently taking trazodone  150 mg and mirtazapine  45 mg at bedtime, which helps him sleep better. No snoring, waking up gasping or choking, and no residual daytime sleepiness or grogginess.  He is a former smoker, having  quit in 2019. He denies any blood in stools or urine and reports weight gain rather than loss. His lab work showed mild anemia with a stable hemoglobin level of 11.3 over the past two years. He is awaiting results from an overnight oximetry test.    Allergies  Allergen Reactions   Benztropine Anaphylaxis   Codeine Anaphylaxis   Meperidine Swelling   Cyclobenzaprine Other (See Comments)    Pt. Does not remember   Penicillins Rash    Has patient had a PCN reaction causing immediate rash,  facial/tongue/throat swelling, SOB or lightheadedness with hypotension: Yes Has patient had a PCN reaction causing severe rash involving mucus membranes or skin necrosis: No Has patient had a PCN reaction that required hospitalization No Has patient had a PCN reaction occurring within the last 10 years: No If all of the above answers are NO, then may proceed with Cephalosporin use.    Sulfamethoxazole-Trimethoprim     REACTION: hives   Sulfonamide Derivatives     REACTION: hives    Immunization History  Administered Date(s) Administered   Fluad Quad(high Dose 65+) 06/09/2020   Influenza Split 04/06/2011, 03/12/2012, 03/31/2015   Influenza Whole 05/10/2009   Influenza,inj,Quad PF,6+ Mos 03/04/2013, 04/02/2017, 03/28/2018   Influenza-Unspecified 04/19/2014   PFIZER(Purple Top)SARS-COV-2 Vaccination 11/20/2018, 12/17/2019   Pneumococcal Conjugate-13 01/27/2009   Pneumococcal Polysaccharide-23 06/19/2008, 06/09/2020   Tdap 12/27/2017    Past Medical History:  Diagnosis Date   Abnormal weight gain 03/09/2018   Acute sinusitis 08/09/2015   Allergic rhinitis    Allergic rhinitis 01/27/2009   Qualifier: Diagnosis of   By: Thalia CMA, Jennifer         Anxiety    AP (abdominal pain) 07/09/2014   Atelectasis    CAD (coronary artery disease), native coronary artery    9/18 PCI/DES x1 to mLCx, mild diffuse nonobstructive disease, EF 55% on Lv gram   Chronic bronchitis (HCC)    Chronic cough 11/19/2011   Chronic diastolic heart failure (HCC) 11/08/2020   COPD (chronic obstructive pulmonary disease) (HCC)    COPD with emphysema (HCC) 02/16/2022   Coronary artery calcification seen on CAT scan 01/12/2017   Coronary artery disease involving native coronary artery of native heart with angina pectoris (HCC) 03/13/2017   Cough 08/09/2015   COVID-19 virus detected 11/25/2018   11/19/2018-SARS-CoV-2-positive     11/19/2018-CT chest with contrast- left lower lobe airspace opacity is noted  consistent with pneumonia, 5 mm right middle lobe nodule is noted no follow-up needed patient is low risk, noncontrast chest CT can be considered in 12 months if patient is high risk     Crohn's disease (HCC)    Crohn's disease without complication (HCC) 01/26/2009   Qualifier: History of   By: Thalia CMA, Jennifer         Depression    Diarrhea 11/19/2018   DIZZINESS, CHRONIC 01/27/2009   Qualifier: Diagnosis of   By: Geronimo MD, Murali         DVT (deep venous thrombosis) (HCC)    RLE X 2   Dysphagia    Dyspnea    Edema 03/09/2018   Encounter for screening for lung cancer 10/09/2012   Shared decision making visit 01/05/2020  Baseline scan to be scheduled by wife.     Enlarged prostate    Essential hypertension 01/27/2009   Qualifier: Diagnosis of   By: Thalia REYNOLDS Delon KATRINA SNOMED Dx Update Oct 2024     Excessive daytime sleepiness  02/16/2022   Fever in adult 11/19/2018   Flu-like symptoms 08/09/2015   GERD (gastroesophageal reflux disease)    Glaucoma, both eyes    Heart murmur    History of stomach ulcers 1980s   Hyperlipidemia    Hypertension    off RX for years now cause of coughing w/Lisinopril (03/13/2017)   IBS (irritable bowel syndrome)    Inadequate community support 11/19/2018   Mixed hyperlipidemia 01/27/2009   Qualifier: Diagnosis of   By: Thalia CMA, Jennifer         Paranoid schizophrenia (HCC)    PERIPHERAL VASCULAR DISEASE 01/27/2009   Qualifier: Diagnosis of   By: Thalia CMA, Jennifer         Peripheral vascular disease (HCC)    Pneumonia 1990s   Pre-diabetes    one time (03/13/2017)   SHORTNESS OF BREATH (SOB) 01/27/2009   Qualifier: Diagnosis of   By: Geronimo MD, Murali         Stroke (HCC) 04/2016   eye stroke; left eye (03/13/2017)   Syncopal episodes    Tobacco abuse 04/06/2011    Tobacco History: Social History   Tobacco Use  Smoking Status Former   Current packs/day: 2.00   Average packs/day: 2.0 packs/day for 55.3  years (110.6 ttl pk-yrs)   Types: Cigarettes   Start date: 11/18/1968  Smokeless Tobacco Never  Tobacco Comments   Quit smoking in 2019 - 12/27/2023 [facility]   Counseling given: Not Answered Tobacco comments: Quit smoking in 2019 - 12/27/2023 [facility]   Outpatient Medications Prior to Visit  Medication Sig Dispense Refill   acetaminophen  (TYLENOL ) 500 MG tablet Take 1 tablet (500 mg total) by mouth every 6 (six) hours as needed. 30 tablet 0   albuterol  (PROVENTIL ) (2.5 MG/3ML) 0.083% nebulizer solution Take 3 mLs (2.5 mg total) by nebulization every 6 (six) hours as needed for wheezing or shortness of breath. 75 mL 12   albuterol  (VENTOLIN  HFA) 108 (90 Base) MCG/ACT inhaler Inhale 1-2 puffs into the lungs every 6 (six) hours as needed for wheezing or shortness of breath. 1 each 0   albuterol  (VENTOLIN  HFA) 108 (90 Base) MCG/ACT inhaler Inhale 2 puffs into the lungs every 4 (four) hours as needed for wheezing or shortness of breath. 1 each 2   alfuzosin  (UROXATRAL ) 10 MG 24 hr tablet Take 10 mg by mouth at bedtime.     amLODipine  (NORVASC ) 5 MG tablet TAKE 1 TABLET(5 MG) BY MOUTH DAILY 90 tablet 3   aspirin  EC 81 MG tablet Take 81 mg by mouth daily.     azithromycin  (ZITHROMAX ) 250 MG tablet Take 500 mg on day 1 and then 250 mg/day for the next 4 days 6 tablet 0   bimatoprost (LUMIGAN) 0.01 % SOLN Place 1 drop into both eyes at bedtime.     brimonidine  (ALPHAGAN ) 0.15 % ophthalmic solution Place 1 drop into both eyes 2 (two) times daily.     cholestyramine  (QUESTRAN ) 4 G packet Take 1 packet by mouth daily.     clonazePAM  (KLONOPIN ) 1 MG tablet Take 1 mg by mouth at bedtime. Anxiety     clopidogrel  (PLAVIX ) 75 MG tablet Take 1 tablet (75 mg total) by mouth daily. 90 tablet 3   dicyclomine  (BENTYL ) 10 MG capsule Take 10 mg by mouth 4 (four) times daily -  before meals and at bedtime.     diphenhydrAMINE  (BENADRYL ) 25 MG tablet Take 50 mg by mouth at bedtime.      doxycycline  (VIBRAMYCIN ) 100  MG capsule  Take 1 capsule (100 mg total) by mouth 2 (two) times daily. 20 capsule 0   finasteride  (PROSCAR ) 5 MG tablet Take 5 mg by mouth daily.     Fluticasone -Umeclidin-Vilant (TRELEGY ELLIPTA ) 100-62.5-25 MCG/ACT AEPB Take 1 puff by mouth daily. Stop spiriva , 1 each 6   Fluticasone -Umeclidin-Vilant (TRELEGY ELLIPTA ) 100-62.5-25 MCG/ACT AEPB Inhale 1 puff into the lungs daily.     furosemide  (LASIX ) 40 MG tablet Take 1 tablet (40 mg total) by mouth four (4) times a week. 48 tablet 2   gabapentin (NEURONTIN) 100 MG capsule Take 100 mg by mouth 3 (three) times daily.     HYDROcodone -acetaminophen  (NORCO/VICODIN) 5-325 MG tablet Take 1 tablet by mouth every 12 (twelve) hours as needed. 4 tablet 0   isosorbide  mononitrate (IMDUR ) 30 MG 24 hr tablet Take 1 tablet (30 mg total) by mouth daily. 90 tablet 3   loxapine  (LOXITANE ) 10 MG capsule Take 10 mg by mouth 2 (two) times daily. 1 tab in AM and 2 tabs in PM     meclizine (ANTIVERT) 25 MG tablet Take 25 mg by mouth 3 (three) times daily as needed for dizziness or nausea.     mercaptopurine  (PURINETHOL ) 50 MG tablet Take 75 mg by mouth daily. 1 tablet and a half tablet Give on an empty stomach 1 hour before or 2 hours after meals. Caution: Chemotherapy.     mesalamine  (PENTASA ) 250 MG CR capsule Take 4 capsules (1,000 mg total) by mouth 4 (four) times daily. 480 capsule 0   metFORMIN (GLUCOPHAGE-XR) 500 MG 24 hr tablet Take 500 mg by mouth daily.     methocarbamol  (ROBAXIN ) 500 MG tablet Take 1 tablet (500 mg total) by mouth 2 (two) times daily. 20 tablet 0   mirtazapine  (REMERON ) 30 MG tablet Take 45 mg by mouth at bedtime. 1.5 tablets by mouth daily     nitroGLYCERIN  (NITROSTAT ) 0.4 MG SL tablet Place 1 tablet (0.4 mg total) under the tongue every 5 (five) minutes x 3 doses as needed for chest pain. 25 tablet 6   ondansetron  (ZOFRAN ) 8 MG tablet Take 8 mg by mouth every 8 (eight) hours as needed for nausea.     ondansetron  (ZOFRAN -ODT) 8 MG disintegrating  tablet Take 1 tablet (8 mg total) by mouth every 8 (eight) hours as needed for nausea. 20 tablet 0   pantoprazole  (PROTONIX ) 40 MG tablet TAKE 1 TABLET(40 MG) BY MOUTH TWICE DAILY 180 tablet 3   PERMETHRIN EX 1 each by Other route in the morning.     potassium chloride  (KLOR-CON ) 10 MEQ tablet Take 1 tablet (10 mEq total) by mouth 2 (two) times daily. 180 tablet 3   predniSONE  (DELTASONE ) 20 MG tablet Take 2 tablets (40 mg total) by mouth daily with breakfast. 10 tablet 0   propranolol (INDERAL) 10 MG tablet 10 mg daily at 6 (six) AM.     rosuvastatin  (CRESTOR ) 20 MG tablet Take 1 tablet (20 mg total) by mouth daily. 90 tablet 3   solifenacin (VESICARE) 5 MG tablet Take 5 mg by mouth daily.     tamsulosin (FLOMAX) 0.4 MG CAPS capsule Take 0.4 mg by mouth at bedtime.     timolol  (TIMOPTIC ) 0.5 % ophthalmic solution Place 1 drop into both eyes 2 (two) times daily.     traMADol  (ULTRAM ) 50 MG tablet Take 50 mg by mouth as needed.     traZODone  (DESYREL ) 50 MG tablet Take 150 mg by mouth at bedtime.  No facility-administered medications prior to visit.    Review of Systems  Review of Systems  Constitutional: Negative.  Negative for fatigue.  Respiratory: Negative.  Negative for cough, shortness of breath and wheezing.   Psychiatric/Behavioral:  Negative for sleep disturbance.     Physical Exam  BP 132/70   Pulse 99   Temp 97.9 F (36.6 C)   Ht 5' 7 (1.702 m)   Wt 222 lb (100.7 kg)   SpO2 96% Comment: RA  BMI 34.77 kg/m  Physical Exam Constitutional:      Appearance: Normal appearance. He is obese.  Cardiovascular:     Rate and Rhythm: Normal rate and regular rhythm.  Pulmonary:     Effort: Pulmonary effort is normal.     Breath sounds: Normal breath sounds.     Comments: CTA Skin:    General: Skin is warm and dry.  Neurological:     General: No focal deficit present.     Mental Status: He is alert and oriented to person, place, and time. Mental status is at baseline.   Psychiatric:        Mood and Affect: Mood normal.        Behavior: Behavior normal.        Thought Content: Thought content normal.        Judgment: Judgment normal.      Lab Results:  CBC    Component Value Date/Time   WBC 5.3 12/27/2023 1132   RBC 3.46 (L) 12/27/2023 1132   HGB 11.3 (L) 12/27/2023 1132   HGB 11.7 (L) 08/04/2022 1211   HCT 33.0 (L) 12/27/2023 1132   HCT 35.7 (L) 08/04/2022 1211   PLT 181.0 12/27/2023 1132   PLT 224 08/04/2022 1211   MCV 95.4 12/27/2023 1132   MCV 92 08/04/2022 1211   MCH 31.4 11/09/2023 1928   MCHC 34.4 12/27/2023 1132   RDW 17.0 (H) 12/27/2023 1132   RDW 14.9 08/04/2022 1211   LYMPHSABS 1.7 12/27/2023 1132   LYMPHSABS 1.5 10/07/2020 1605   MONOABS 0.4 12/27/2023 1132   EOSABS 0.0 12/27/2023 1132   EOSABS 0.1 10/07/2020 1605   BASOSABS 0.0 12/27/2023 1132   BASOSABS 0.0 10/07/2020 1605    BMET    Component Value Date/Time   NA 141 11/09/2023 1928   NA 138 08/04/2022 1211   K 3.9 11/09/2023 1928   CL 105 11/09/2023 1928   CO2 23 11/09/2023 1928   GLUCOSE 102 (H) 11/09/2023 1928   BUN 8 11/09/2023 1928   BUN 8 08/04/2022 1211   CREATININE 1.05 11/09/2023 1928   CALCIUM  9.1 11/09/2023 1928   GFRNONAA >60 11/09/2023 1928   GFRAA 106 06/09/2020 1107    BNP    Component Value Date/Time   BNP 25.2 10/26/2021 1915    ProBNP    Component Value Date/Time   PROBNP 14.0 12/27/2023 1132    Imaging: No results found.    Assessment & Plan:    Assessment and Plan Assessment & Plan Chronic bronchitis Chronic bronchitis in remission since 2019, with recent cough and yellow mucus treated with Z-Pak and prednisone . CT scan showed clear lungs with no intrathoracic issues. Breathing test indicated some restriction, likely weight-related, with no evidence of obstructive lung disease. Trelegy Ellipta  has been beneficial for managing symptoms, particularly cough. - Continue Trelegy 100mcg for chronic bronchitis management. -  Rinse mouth after using Trelegy to prevent thrush due to steroid content.  Obesity Obesity potentially contributing to restrictive lung disease as indicated  by breathing test results.  Anemia Mild anemia with hemoglobin level of 11.3, well-managed over the past two years. No evidence of heart failure on lab tests, and BNP was normal. No reported blood in stools or urine, and no abnormal weight loss.  Coronary artery disease CT scan showed coronary calcifications and plaque buildup in the aorta, consistent with known coronary artery disease. He is on Crestor  for cholesterol management.  Insomnia Insomnia managed with trazodone  150 mg and mirtazapine  45 mg at bedtime. He reports improved sleep with this regimen and no significant daytime sleepiness or grogginess. - Confirmed outside medication reconciliation  - Continue medication as prescribed, refill trazodone  provided.   Sleep study follow-up Previous sleep study in 2023 showed no significant sleep apnea but was potentially inaccurate due to limited sleep time. Awaiting results of overnight oximetry test to assess oxygen levels during sleep. - Contact clinic with results of overnight oximetry test via MyChart or phone. - Follow up in six months or sooner if needed.    Almarie LELON Ferrari, NP 03/09/2024

## 2024-03-26 ENCOUNTER — Other Ambulatory Visit: Payer: Self-pay | Admitting: Interventional Cardiology

## 2024-03-27 NOTE — Progress Notes (Signed)
 This encounter was created in error - please disregard.

## 2024-06-03 ENCOUNTER — Ambulatory Visit: Payer: Self-pay | Admitting: Internal Medicine

## 2024-06-03 NOTE — Telephone Encounter (Signed)
 FYI Only or Action Required?: Action required by provider: clinical question for provider.  Patient is followed in Pulmonology for n/a, last seen on 02/29/2024 by Hope Almarie ORN, NP.  Called Nurse Triage reporting Cough.  Symptoms began Saturday.  Interventions attempted: Nothing.  Symptoms are: gradually worsening.  Triage Disposition: See PCP When Office is Open (Within 3 Days)  Patient/caregiver understands and will follow disposition?: No, wishes to speak with PCP    Copied from CRM #[id number]. Topic: Clinical - Red Word Triage >> Jun 03, 2024  3:23 PM Lavanda D wrote: Red Word that prompted transfer to Nurse Triage: SOB/Discoloration of Mucus: Patient is experiencing a discoloration of his mucus + SOB. Patient is requesting zpak and prednisone  if possible. Reason for Disposition  Cough has been present for > 3 weeks  Answer Assessment - Initial Assessment Questions 1. ONSET: When did the cough begin?      Saturday 2. SEVERITY: How bad is the cough today?      cough 3. SPUTUM: Describe the color of your sputum (e.g., none, dry cough; clear, white, yellow, green)     yellowish 4. HEMOPTYSIS: Are you coughing up any blood? If Yes, ask: How much? (e.g., flecks, streaks, tablespoons, etc.)     no 5. DIFFICULTY BREATHING: Are you having difficulty breathing? If Yes, ask: How bad is it? (e.g., mild, moderate, severe)      SOB w/ exertion 6. FEVER: Do you have a fever? If Yes, ask: What is your temperature, how was it measured, and when did it start?     no 7. CARDIAC HISTORY: Do you have any history of heart disease? (e.g., heart attack, congestive heart failure)      na 8. LUNG HISTORY: Do you have any history of lung disease?  (e.g., pulmonary embolus, asthma, emphysema)     na 9. PE RISK FACTORS: Do you have a history of blood clots? (or: recent major surgery, recent prolonged travel, bedridden)     na 10. OTHER SYMPTOMS: Do you have any other  symptoms? (e.g., runny nose, wheezing, chest pain)       Runny nose,  11. PREGNANCY: Is there any chance you are pregnant? When was your last menstrual period?       na 12. TRAVEL: Have you traveled out of the country in the last month? (e.g., travel history, exposures)       Na  Pt has history COPD and is requesting ABX and prednisone  to help with cold s/s: pt is requesting medication be sent to [facility] on [address] in [place], [place]  Protocols used: Cough - Acute Productive-A-AH

## 2024-06-03 NOTE — Telephone Encounter (Signed)
 Pt has history COPD and is requesting ABX and prednisone  to help with cold s/s: pt is requesting medication be sent to [facility] on [address] in [place], [place]

## 2024-06-04 NOTE — Telephone Encounter (Signed)
 Please advise

## 2024-06-05 MED ORDER — PREDNISONE 10 MG PO TABS: ORAL_TABLET | ORAL | 0 refills | Status: AC

## 2024-06-05 MED ORDER — DOXYCYCLINE HYCLATE 100 MG PO TABS: ORAL_TABLET | ORAL | 0 refills | Status: AC

## 2024-06-05 NOTE — Addendum Note (Signed)
 Addended by: GERONIMO AMEL on: 06/05/2024 06:01 AM   Modules accepted: Orders

## 2024-06-05 NOTE — Telephone Encounter (Signed)
°  sent  Please take prednisone  40 mg x1 day, then 30 mg x1 day, then 20 mg x1 day, then 10 mg x1 day, and then 5 mg x1 day and stop   Take doxycycline  100mg  po twice daily x 5 days; take after meals and avoid sunlight    Allergies[1]     [1]  Allergies Allergen Reactions   Benztropine Anaphylaxis   Codeine Anaphylaxis   Meperidine Swelling   Cyclobenzaprine Other (See Comments)    Pt. Does not remember   Penicillins Rash    Has patient had a PCN reaction causing immediate rash, facial/tongue/throat swelling, SOB or lightheadedness with hypotension: Yes Has patient had a PCN reaction causing severe rash involving mucus membranes or skin necrosis: No Has patient had a PCN reaction that required hospitalization No Has patient had a PCN reaction occurring within the last 10 years: No If all of the above answers are NO, then may proceed with Cephalosporin use.    Sulfamethoxazole-Trimethoprim     REACTION: hives   Sulfonamide Derivatives     REACTION: hives

## 2024-06-05 NOTE — Telephone Encounter (Signed)
 I called and spoke to pt. Pt informed of Dr Geronimo note and verbalized understanding. NFN

## 2024-06-06 MED ORDER — DOXYCYCLINE HYCLATE 100 MG PO TABS: ORAL_TABLET | ORAL | 0 refills | Status: DC

## 2024-06-06 MED ORDER — PREDNISONE 10 MG PO TABS: ORAL_TABLET | ORAL | 0 refills | Status: AC

## 2024-06-06 MED ORDER — DOXYCYCLINE HYCLATE 100 MG PO TABS: ORAL_TABLET | ORAL | 0 refills | Status: AC

## 2024-06-06 NOTE — Telephone Encounter (Signed)
 Copied from CRM #[id number]. Topic: Clinical - Prescription Issue >> Jun 06, 2024 10:31 AM Isabell A wrote: Reason for CRM: Patients care giver states patient did not receive prednisone .  Callback number: [phone]   Pts caregiver is aware meds were re-sent to correct pharmacy. Nothing further needed.

## 2024-06-06 NOTE — Telephone Encounter (Signed)
 Copied from CRM #[id number]. Topic: Clinical - Prescription Issue >> Jun 06, 2024 10:52 AM Isabell A wrote: Reason for CRM: doxycycline  (VIBRA -TABS) 100 MG tablet [[id number]]  has been sent to the wrong pharmacy - requesting for it to be sent to    [id number] - [place], [place] - [address], [place] [address] Phone: [phone]  Fax: [phone]      Mail delivery wont be available until Tuesday.    Rx sent to preferred pharmacy.
